# Patient Record
Sex: Female | Born: 1935 | Race: White | Hispanic: No | Smoking: Never smoker
Health system: Southern US, Community
[De-identification: ages and names within clinical notes are randomized; demographics above are authoritative.]

## PROBLEM LIST (undated history)

## (undated) DIAGNOSIS — N186 End stage renal disease: Secondary | ICD-10-CM

## (undated) DIAGNOSIS — T4145XA Adverse effect of unspecified anesthetic, initial encounter: Secondary | ICD-10-CM

## (undated) DIAGNOSIS — J449 Chronic obstructive pulmonary disease, unspecified: Secondary | ICD-10-CM

## (undated) DIAGNOSIS — T8859XA Other complications of anesthesia, initial encounter: Secondary | ICD-10-CM

## (undated) DIAGNOSIS — Z9289 Personal history of other medical treatment: Secondary | ICD-10-CM

## (undated) DIAGNOSIS — R112 Nausea with vomiting, unspecified: Secondary | ICD-10-CM

## (undated) DIAGNOSIS — I5022 Chronic systolic (congestive) heart failure: Secondary | ICD-10-CM

## (undated) DIAGNOSIS — Z8701 Personal history of pneumonia (recurrent): Secondary | ICD-10-CM

## (undated) DIAGNOSIS — D649 Anemia, unspecified: Secondary | ICD-10-CM

## (undated) DIAGNOSIS — Z9889 Other specified postprocedural states: Secondary | ICD-10-CM

## (undated) DIAGNOSIS — I2510 Atherosclerotic heart disease of native coronary artery without angina pectoris: Secondary | ICD-10-CM

## (undated) DIAGNOSIS — N2581 Secondary hyperparathyroidism of renal origin: Secondary | ICD-10-CM

## (undated) DIAGNOSIS — F0390 Unspecified dementia without behavioral disturbance: Secondary | ICD-10-CM

## (undated) DIAGNOSIS — M1990 Unspecified osteoarthritis, unspecified site: Secondary | ICD-10-CM

## (undated) DIAGNOSIS — IMO0001 Reserved for inherently not codable concepts without codable children: Secondary | ICD-10-CM

## (undated) HISTORY — DX: Secondary hyperparathyroidism of renal origin: N25.81

## (undated) HISTORY — PX: DIALYSIS FISTULA CREATION: SHX611

## (undated) HISTORY — DX: Chronic obstructive pulmonary disease, unspecified: J44.9

## (undated) HISTORY — DX: Atherosclerotic heart disease of native coronary artery without angina pectoris: I25.10

## (undated) HISTORY — DX: Chronic systolic (congestive) heart failure: I50.22

## (undated) HISTORY — DX: Personal history of pneumonia (recurrent): Z87.01

## (undated) HISTORY — DX: End stage renal disease: N18.6

## (undated) SURGERY — Surgical Case
Anesthesia: *Unknown

---

## 1990-11-19 HISTORY — PX: CHOLECYSTECTOMY: SHX55

## 2009-03-30 ENCOUNTER — Ambulatory Visit (HOSPITAL_COMMUNITY): Admission: RE | Admit: 2009-03-30 | Discharge: 2009-03-30 | Payer: Self-pay | Admitting: Family Medicine

## 2009-03-31 ENCOUNTER — Inpatient Hospital Stay (HOSPITAL_COMMUNITY): Admission: EM | Admit: 2009-03-31 | Discharge: 2009-04-12 | Payer: Self-pay | Admitting: Emergency Medicine

## 2009-04-05 ENCOUNTER — Ambulatory Visit: Payer: Self-pay | Admitting: Oncology

## 2009-04-05 ENCOUNTER — Encounter (INDEPENDENT_AMBULATORY_CARE_PROVIDER_SITE_OTHER): Payer: Self-pay | Admitting: Family Medicine

## 2009-04-12 ENCOUNTER — Inpatient Hospital Stay: Admission: RE | Admit: 2009-04-12 | Discharge: 2009-05-14 | Payer: Self-pay | Admitting: Family Medicine

## 2009-04-14 ENCOUNTER — Ambulatory Visit: Payer: Self-pay | Admitting: Vascular Surgery

## 2009-04-14 ENCOUNTER — Ambulatory Visit (HOSPITAL_COMMUNITY): Admission: RE | Admit: 2009-04-14 | Discharge: 2009-04-14 | Payer: Self-pay | Admitting: Vascular Surgery

## 2011-02-27 LAB — CBC
HCT: 23.4 % — ABNORMAL LOW (ref 36.0–46.0)
HCT: 26 % — ABNORMAL LOW (ref 36.0–46.0)
HCT: 26.5 % — ABNORMAL LOW (ref 36.0–46.0)
HCT: 26.5 % — ABNORMAL LOW (ref 36.0–46.0)
HCT: 27.8 % — ABNORMAL LOW (ref 36.0–46.0)
Hemoglobin: 10 g/dL — ABNORMAL LOW (ref 12.0–15.0)
Hemoglobin: 10.2 g/dL — ABNORMAL LOW (ref 12.0–15.0)
Hemoglobin: 8.5 g/dL — ABNORMAL LOW (ref 12.0–15.0)
Hemoglobin: 9.4 g/dL — ABNORMAL LOW (ref 12.0–15.0)
Hemoglobin: 9.8 g/dL — ABNORMAL LOW (ref 12.0–15.0)
Hemoglobin: 9.9 g/dL — ABNORMAL LOW (ref 12.0–15.0)
MCHC: 34.7 g/dL (ref 30.0–36.0)
MCHC: 34.7 g/dL (ref 30.0–36.0)
MCHC: 34.8 g/dL (ref 30.0–36.0)
MCHC: 35.6 g/dL (ref 30.0–36.0)
MCHC: 35.9 g/dL (ref 30.0–36.0)
MCHC: 34.8 g/dL (ref 30.0–36.0)
MCHC: 35 g/dL (ref 30.0–36.0)
MCV: 81.7 fL (ref 78.0–100.0)
MCV: 82.2 fL (ref 78.0–100.0)
MCV: 82.2 fL (ref 78.0–100.0)
MCV: 82.3 fL (ref 78.0–100.0)
MCV: 82.8 fL (ref 78.0–100.0)
MCV: 82.4 fL (ref 78.0–100.0)
Platelets: 256 10*3/uL (ref 150–400)
Platelets: 288 10*3/uL (ref 150–400)
Platelets: 317 10*3/uL (ref 150–400)
Platelets: 318 10*3/uL (ref 150–400)
Platelets: 324 10*3/uL (ref 150–400)
Platelets: 333 10*3/uL (ref 150–400)
Platelets: 308 10*3/uL (ref 150–400)
RBC: 2.85 MIL/uL — ABNORMAL LOW (ref 3.87–5.11)
RBC: 2.95 MIL/uL — ABNORMAL LOW (ref 3.87–5.11)
RBC: 3.18 MIL/uL — ABNORMAL LOW (ref 3.87–5.11)
RBC: 3.2 MIL/uL — ABNORMAL LOW (ref 3.87–5.11)
RBC: 3.42 MIL/uL — ABNORMAL LOW (ref 3.87–5.11)
RBC: 3.55 MIL/uL — ABNORMAL LOW (ref 3.87–5.11)
RBC: 3.24 MIL/uL — ABNORMAL LOW (ref 3.87–5.11)
RDW: 13.8 % (ref 11.5–15.5)
RDW: 13.9 % (ref 11.5–15.5)
RDW: 14 % (ref 11.5–15.5)
RDW: 13.9 % (ref 11.5–15.5)
RDW: 14 % (ref 11.5–15.5)
RDW: 14.1 % (ref 11.5–15.5)
WBC: 10.4 10*3/uL (ref 4.0–10.5)
WBC: 11.1 10*3/uL — ABNORMAL HIGH (ref 4.0–10.5)
WBC: 11.4 10*3/uL — ABNORMAL HIGH (ref 4.0–10.5)
WBC: 11.6 10*3/uL — ABNORMAL HIGH (ref 4.0–10.5)
WBC: 9.2 10*3/uL (ref 4.0–10.5)
WBC: 9.5 10*3/uL (ref 4.0–10.5)
WBC: 9.6 10*3/uL (ref 4.0–10.5)
WBC: 10.8 10*3/uL — ABNORMAL HIGH (ref 4.0–10.5)

## 2011-02-27 LAB — BASIC METABOLIC PANEL
BUN: 66 mg/dL — ABNORMAL HIGH (ref 6–23)
BUN: 68 mg/dL — ABNORMAL HIGH (ref 6–23)
BUN: 70 mg/dL — ABNORMAL HIGH (ref 6–23)
BUN: 74 mg/dL — ABNORMAL HIGH (ref 6–23)
BUN: 78 mg/dL — ABNORMAL HIGH (ref 6–23)
BUN: 81 mg/dL — ABNORMAL HIGH (ref 6–23)
BUN: 87 mg/dL — ABNORMAL HIGH (ref 6–23)
CO2: 21 mEq/L (ref 19–32)
CO2: 21 mEq/L (ref 19–32)
CO2: 21 mEq/L (ref 19–32)
CO2: 21 mEq/L (ref 19–32)
CO2: 17 mEq/L — ABNORMAL LOW (ref 19–32)
CO2: 18 mEq/L — ABNORMAL LOW (ref 19–32)
CO2: 19 mEq/L (ref 19–32)
Calcium: 8.9 mg/dL (ref 8.4–10.5)
Calcium: 9 mg/dL (ref 8.4–10.5)
Calcium: 9.1 mg/dL (ref 8.4–10.5)
Calcium: 9.1 mg/dL (ref 8.4–10.5)
Calcium: 8.8 mg/dL (ref 8.4–10.5)
Chloride: 105 mEq/L (ref 96–112)
Chloride: 108 mEq/L (ref 96–112)
Chloride: 93 mEq/L — ABNORMAL LOW (ref 96–112)
Chloride: 99 mEq/L (ref 96–112)
Chloride: 99 mEq/L (ref 96–112)
Chloride: 101 mEq/L (ref 96–112)
Chloride: 102 mEq/L (ref 96–112)
Chloride: 104 mEq/L (ref 96–112)
Creatinine, Ser: 5.35 mg/dL — ABNORMAL HIGH (ref 0.4–1.2)
Creatinine, Ser: 5.39 mg/dL — ABNORMAL HIGH (ref 0.4–1.2)
Creatinine, Ser: 5.83 mg/dL — ABNORMAL HIGH (ref 0.4–1.2)
Creatinine, Ser: 6.41 mg/dL — ABNORMAL HIGH (ref 0.4–1.2)
Creatinine, Ser: 6.63 mg/dL — ABNORMAL HIGH (ref 0.4–1.2)
Creatinine, Ser: 8.23 mg/dL — ABNORMAL HIGH (ref 0.4–1.2)
GFR calc Af Amer: 10 mL/min — ABNORMAL LOW (ref >60)
GFR calc Af Amer: 10 mL/min — ABNORMAL LOW (ref >60)
GFR calc Af Amer: 7 mL/min — ABNORMAL LOW (ref >60)
GFR calc Af Amer: 7 mL/min — ABNORMAL LOW (ref >60)
GFR calc Af Amer: 8 mL/min — ABNORMAL LOW (ref >60)
GFR calc Af Amer: 9 mL/min — ABNORMAL LOW (ref >60)
GFR calc non Af Amer: 6 mL/min — ABNORMAL LOW (ref >60)
GFR calc non Af Amer: 6 mL/min — ABNORMAL LOW (ref >60)
GFR calc non Af Amer: 8 mL/min — ABNORMAL LOW (ref >60)
GFR calc non Af Amer: 8 mL/min — ABNORMAL LOW (ref >60)
GFR calc non Af Amer: 5 mL/min — ABNORMAL LOW (ref >60)
Glucose, Bld: 102 mg/dL — ABNORMAL HIGH (ref 70–99)
Glucose, Bld: 105 mg/dL — ABNORMAL HIGH (ref 70–99)
Glucose, Bld: 90 mg/dL (ref 70–99)
Glucose, Bld: 95 mg/dL (ref 70–99)
Glucose, Bld: 96 mg/dL (ref 70–99)
Glucose, Bld: 101 mg/dL — ABNORMAL HIGH (ref 70–99)
Glucose, Bld: 99 mg/dL (ref 70–99)
Potassium: 3.7 mEq/L (ref 3.5–5.1)
Potassium: 3.7 mEq/L (ref 3.5–5.1)
Potassium: 3.8 mEq/L (ref 3.5–5.1)
Potassium: 4 mEq/L (ref 3.5–5.1)
Potassium: 4.1 mEq/L (ref 3.5–5.1)
Potassium: 4.4 mEq/L (ref 3.5–5.1)
Potassium: 4.6 mEq/L (ref 3.5–5.1)
Potassium: 5.1 mEq/L (ref 3.5–5.1)
Sodium: 125 mEq/L — ABNORMAL LOW (ref 135–145)
Sodium: 126 mEq/L — ABNORMAL LOW (ref 135–145)
Sodium: 129 mEq/L — ABNORMAL LOW (ref 135–145)
Sodium: 134 mEq/L — ABNORMAL LOW (ref 135–145)
Sodium: 132 mEq/L — ABNORMAL LOW (ref 135–145)

## 2011-02-27 LAB — CROSSMATCH: ABO/RH(D): A POS

## 2011-02-27 LAB — DIFFERENTIAL
Basophils Absolute: 0 10*3/uL (ref 0.0–0.1)
Basophils Absolute: 0 10*3/uL (ref 0.0–0.1)
Basophils Absolute: 0 10*3/uL (ref 0.0–0.1)
Basophils Absolute: 0 10*3/uL (ref 0.0–0.1)
Basophils Absolute: 0 10*3/uL (ref 0.0–0.1)
Basophils Relative: 0 % (ref 0–1)
Basophils Relative: 0 % (ref 0–1)
Basophils Relative: 0 % (ref 0–1)
Basophils Relative: 1 % (ref 0–1)
Basophils Relative: 0 % (ref 0–1)
Eosinophils Absolute: 0.4 10*3/uL (ref 0.0–0.7)
Eosinophils Absolute: 0.5 10*3/uL (ref 0.0–0.7)
Eosinophils Absolute: 0.5 10*3/uL (ref 0.0–0.7)
Eosinophils Absolute: 0.6 10*3/uL (ref 0.0–0.7)
Eosinophils Absolute: 0.7 10*3/uL (ref 0.0–0.7)
Eosinophils Absolute: 0.5 10*3/uL (ref 0.0–0.7)
Eosinophils Relative: 4 % (ref 0–5)
Eosinophils Relative: 4 % (ref 0–5)
Eosinophils Relative: 4 % (ref 0–5)
Eosinophils Relative: 5 % (ref 0–5)
Eosinophils Relative: 5 % (ref 0–5)
Eosinophils Relative: 7 % — ABNORMAL HIGH (ref 0–5)
Lymphocytes Relative: 11 % — ABNORMAL LOW (ref 12–46)
Lymphocytes Relative: 15 % (ref 12–46)
Lymphocytes Relative: 15 % (ref 12–46)
Lymphocytes Relative: 15 % (ref 12–46)
Lymphocytes Relative: 16 % (ref 12–46)
Lymphocytes Relative: 13 % (ref 12–46)
Lymphocytes Relative: 15 % (ref 12–46)
Lymphs Abs: 1.3 10*3/uL (ref 0.7–4.0)
Lymphs Abs: 1.4 10*3/uL (ref 0.7–4.0)
Lymphs Abs: 1.4 10*3/uL (ref 0.7–4.0)
Lymphs Abs: 1.5 10*3/uL (ref 0.7–4.0)
Lymphs Abs: 1.6 10*3/uL (ref 0.7–4.0)
Lymphs Abs: 1.8 10*3/uL (ref 0.7–4.0)
Lymphs Abs: 1.9 10*3/uL (ref 0.7–4.0)
Lymphs Abs: 1.5 10*3/uL (ref 0.7–4.0)
Lymphs Abs: 1.6 10*3/uL (ref 0.7–4.0)
Monocytes Absolute: 0.5 10*3/uL (ref 0.1–1.0)
Monocytes Absolute: 0.6 10*3/uL (ref 0.1–1.0)
Monocytes Absolute: 0.6 10*3/uL (ref 0.1–1.0)
Monocytes Absolute: 0.6 10*3/uL (ref 0.1–1.0)
Monocytes Absolute: 0.7 10*3/uL (ref 0.1–1.0)
Monocytes Relative: 4 % (ref 3–12)
Monocytes Relative: 5 % (ref 3–12)
Monocytes Relative: 5 % (ref 3–12)
Monocytes Relative: 5 % (ref 3–12)
Monocytes Relative: 5 % (ref 3–12)
Monocytes Relative: 7 % (ref 3–12)
Monocytes Relative: 5 % (ref 3–12)
Monocytes Relative: 6 % (ref 3–12)
Neutro Abs: 6.8 10*3/uL (ref 1.7–7.7)
Neutro Abs: 6.9 10*3/uL (ref 1.7–7.7)
Neutro Abs: 8 10*3/uL — ABNORMAL HIGH (ref 1.7–7.7)
Neutro Abs: 9.8 10*3/uL — ABNORMAL HIGH (ref 1.7–7.7)
Neutro Abs: 8.2 10*3/uL — ABNORMAL HIGH (ref 1.7–7.7)
Neutro Abs: 8.6 10*3/uL — ABNORMAL HIGH (ref 1.7–7.7)
Neutro Abs: 9.2 10*3/uL — ABNORMAL HIGH (ref 1.7–7.7)
Neutrophils Relative %: 72 % (ref 43–77)
Neutrophils Relative %: 74 % (ref 43–77)
Neutrophils Relative %: 75 % (ref 43–77)
Neutrophils Relative %: 78 % — ABNORMAL HIGH (ref 43–77)
Neutrophils Relative %: 85 % — ABNORMAL HIGH (ref 43–77)
Neutrophils Relative %: 76 % (ref 43–77)
Neutrophils Relative %: 76 % (ref 43–77)
Neutrophils Relative %: 76 % (ref 43–77)

## 2011-02-27 LAB — COMPREHENSIVE METABOLIC PANEL
ALT: 21 U/L (ref 0–35)
BUN: 96 mg/dL — ABNORMAL HIGH (ref 6–23)
Calcium: 8.8 mg/dL (ref 8.4–10.5)
Glucose, Bld: 125 mg/dL — ABNORMAL HIGH (ref 70–99)
Sodium: 132 mEq/L — ABNORMAL LOW (ref 135–145)
Total Protein: 6.9 g/dL (ref 6.0–8.3)

## 2011-02-27 LAB — URINALYSIS, ROUTINE W REFLEX MICROSCOPIC
Bilirubin Urine: NEGATIVE
Glucose, UA: NEGATIVE mg/dL
Ketones, ur: NEGATIVE mg/dL
Ketones, ur: NEGATIVE mg/dL
Protein, ur: >300 mg/dL — AB
Specific Gravity, Urine: 1.015 (ref 1.005–1.030)
Urobilinogen, UA: 0.2 mg/dL (ref 0.0–1.0)
pH: 6 (ref 5.0–8.0)

## 2011-02-27 LAB — PROTEIN ELECTROPH W RFLX QUANT IMMUNOGLOBULINS
Alpha-1-Globulin: 9.4 % — ABNORMAL HIGH (ref 2.9–4.9)
Alpha-2-Globulin: 16 % — ABNORMAL HIGH (ref 7.1–11.8)
Beta 2: 5.8 % (ref 3.2–6.5)
Gamma Globulin: 22.2 % — ABNORMAL HIGH (ref 11.1–18.8)
M-Spike, %: NOT DETECTED g/dL

## 2011-02-27 LAB — PHOSPHORUS
Phosphorus: 4.6 mg/dL (ref 2.3–4.6)
Phosphorus: 7.8 mg/dL — ABNORMAL HIGH (ref 2.3–4.6)

## 2011-02-27 LAB — IRON AND TIBC
Iron: 18 ug/dL — ABNORMAL LOW (ref 42–135)
Saturation Ratios: 11 % — ABNORMAL LOW (ref 20–55)
TIBC: 163 ug/dL — ABNORMAL LOW (ref 250–470)
UIBC: 145 ug/dL

## 2011-02-27 LAB — IGG, IGA, IGM
IgA: 218 mg/dL (ref 68–378)
IgG (Immunoglobin G), Serum: 1240 mg/dL (ref 694–1618)

## 2011-02-27 LAB — POCT I-STAT 4, (NA,K, GLUC, HGB,HCT)
HCT: 31 % — ABNORMAL LOW (ref 36.0–46.0)
Hemoglobin: 10.5 g/dL — ABNORMAL LOW (ref 12.0–15.0)
Sodium: 132 mEq/L — ABNORMAL LOW (ref 135–145)

## 2011-02-27 LAB — URINE MICROSCOPIC-ADD ON

## 2011-02-27 LAB — PROTIME-INR
INR: 1.1 (ref 0.00–1.49)
Prothrombin Time: 14.9 seconds (ref 11.6–15.2)

## 2011-02-27 LAB — URINE CULTURE
Colony Count: NO GROWTH
Special Requests: NEGATIVE

## 2011-02-27 LAB — MAGNESIUM: Magnesium: 2.3 mg/dL (ref 1.5–2.5)

## 2011-02-27 LAB — PROTEIN, URINE, 24 HOUR
Collection Interval-UPROT: 24 hours
Urine Total Volume-UPROT: 1750 mL

## 2011-02-27 LAB — HEMOGLOBIN A1C
Hgb A1c MFr Bld: 5.3 % (ref 4.6–6.1)
Mean Plasma Glucose: 105 mg/dL

## 2011-02-27 LAB — ANTI-NEUTROPHIL ANTIBODY

## 2011-02-27 LAB — C3 COMPLEMENT: C3 Complement: 140 mg/dL (ref 88–201)

## 2011-02-27 LAB — FERRITIN: Ferritin: 302 ng/mL — ABNORMAL HIGH (ref 10–291)

## 2011-02-27 LAB — UIFE/LIGHT CHAINS/TP QN, 24-HR UR
Albumin, U: DETECTED
Alpha 1, Urine: DETECTED — AB
Beta, Urine: DETECTED — AB
Gamma Globulin, Urine: DETECTED — AB
Total Protein, Urine-Ur/day: 1057 mg/d — ABNORMAL HIGH (ref 10–140)

## 2011-02-27 LAB — ANTISTREPTOLYSIN O TITER: ASO: 30 IU/mL (ref 0–116)

## 2011-02-27 LAB — C4 COMPLEMENT: Complement C4, Body Fluid: 22 mg/dL (ref 16–47)

## 2011-02-27 LAB — IMMUNOFIXATION ADD-ON

## 2011-02-27 LAB — PTH, INTACT AND CALCIUM: Calcium, Total (PTH): 7.5 mg/dL — ABNORMAL LOW (ref 8.4–10.5)

## 2011-04-03 NOTE — Op Note (Signed)
NAMEJOSCLYN, ROSALES                 ACCOUNT NO.:  [id number]   MEDICAL RECORD NO.:  [id number]          PATIENT TYPE:  AMB   LOCATION:  SDS                          FACILITY:  [facility]   PHYSICIAN:  Larina Earthly, M.D.    DATE OF BIRTH:  1931/07/14   DATE OF PROCEDURE:  04/14/2009  DATE OF DISCHARGE:  04/14/2009                               OPERATIVE REPORT   PREOPERATIVE DIAGNOSIS:  End-stage renal disease.   POSTOPERATIVE DIAGNOSIS:  End-stage renal disease.   PROCEDURE:  Right internal jugular Diatek catheter placement with  ultrasound visualization.   SURGEON:  Larina Earthly, MD   ASSISTANT:  Nurse.   ANESTHESIA:  MAC.   COMPLICATIONS:  None.   DISPOSITION:  To recovery room, stable.   PROCEDURE IN DETAIL:  The patient was taken to the operating room and  placed in the supine position.  The area of the right and left neck were  imaged with ultrasound revealing patent jugular veins bilaterally.  The  patient was placed in Trendelenburg position, and the right and left  neck and chest were prepped and draped in the usual sterile fashion.  Using local anesthesia and a finer needle, the right internal jugular  vein was accessed.  Next, using Seldinger technique, a guidewire was  passed down to the level of the right atrium.  This was confirmed with  fluoroscopy.  Dilator and peel-away sheath was passed over the guidewire  and the dilator and guidewire were removed.  A 28-cm Diatek catheter was  positioned to the level of the distal right atrium.  The catheter was  brought through subcutaneous tunnel through a separate stab incision.  The two lumen ports were attached and both lumens were flushed,  aspirated easily, and were locked with 1000 units/mL heparin.  The  catheter was secured to skin with 3-0 nylon stitch and the entry site  was closed with 4-0 subcuticular Vicryl stitch.  Sterile dressing was  applied and the patient was taken to the recovery room in stable  condition.      Larina Earthly, M.D.  Electronically Signed     TFE/MEDQ  D:  04/14/2009  T:  04/15/2009  Job:  [id number]

## 2011-04-03 NOTE — Group Therapy Note (Signed)
NAME:  [name], [name]                 ACCOUNT NO.:  [id number]   MEDICAL RECORD NO.:  [id number]          PATIENT TYPE:  INP   LOCATION:  A334                          FACILITY:  [facility]   PHYSICIAN:  Angus G. Renard Matter, MD   DATE OF BIRTH:  February 15, 1931   DATE OF PROCEDURE:  DATE OF DISCHARGE:                                 PROGRESS NOTE   The patient presented to the hospital with an abnormal BUN, creatinine,  and hematuria.  She remains on IV fluids.  Most recent labs show  creatinine of 7.11 with a GFR of 7.  The patient's hemoglobin is 8.1,  hematocrit 23.4, ferritin 302, parathyroid hormone 7.5, serum iron 18,  and total iron binding capacity of 163.   PHYSICAL EXAMINATION:  VITAL SIGNS:  BP 133/68, respiration 20, pulse  80, and temp 98.5.  LUNGS:  Clear to P and A.  HEART:  Regular rhythm.  ABDOMEN:  No palpable organs or masses.   ASSESSMENT:  The patient was admitted with renal insufficiency,  questionable acute versus chronic.  She had some cortical atrophy in the  right kidney, has anemia, and urinary tract infection.   PLAN:  To proceed with blood transfusion today.  Continue to monitor her  serum electrolytes.  Stool for occult blood.  We will add oral iron.      Angus G. Renard Matter, MD  Electronically Signed     AGM/MEDQ  D:  04/03/2009  T:  04/03/2009  Job:  [id number]

## 2011-04-03 NOTE — Group Therapy Note (Signed)
NAME:  [name], [name]                 ACCOUNT NO.:  [id number]   MEDICAL RECORD NO.:  [id number]          PATIENT TYPE:  INP   LOCATION:  A332                          FACILITY:  [facility]   PHYSICIAN:  Angus G. Renard Matter, MD   DATE OF BIRTH:  1931-08-23   DATE OF PROCEDURE:  DATE OF DISCHARGE:                                 PROGRESS NOTE   This patient entered the hospital with abnormal BUN, creatinine, and  hematuria.  She remains on IV fluids.  Her BUN and creatinine have  improved some.  CBC, BMET, and phosphorus have been done, but report at  present is not available.  The patient is much more comfortable, who is  being seen by Nephrology.   OBJECTIVE:  VITAL SIGNS:  BP 122/60, respiration 18, pulse 82, and  temperature 98.7.  LUNGS:  Clear to P&A.  HEART:  Regular rhythm.  ABDOMEN:  No palpable organs or masses.   LABORATORY DATA:  Hemoglobin 9.9, hematocrit 28.6.  Chemistries showed  sodium of 127, potassium 4.1, chloride 97, CO2 20, glucose 102, BUN 78,  creatinine 6.63, GFR 6.   ASSESSMENT:  The patient was admitted with renal insufficiency, acute  versus chronic.  She has some cortical atrophy in the right kidney, has  anemia, hematuria, urinary tract infection which is being treated.   PLAN:  To continue current regimen.  Continue to monitor hemoglobin,  hematocrit, electrolytes.  The patient will be seen by Nephrology as  well.  Ferrous sulfate was added because of iron deficiency.      Angus G. Renard Matter, MD  Electronically Signed     AGM/MEDQ  D:  04/05/2009  T:  04/05/2009  Job:  [id number]

## 2011-04-03 NOTE — Group Therapy Note (Signed)
NAME:  [name], [name]                 ACCOUNT NO.:  [id number]   MEDICAL RECORD NO.:  [id number]          PATIENT TYPE:  INP   LOCATION:  A334                          FACILITY:  [facility]   PHYSICIAN:  Angus G. Renard Matter, MD   DATE OF BIRTH:  01-15-1931   DATE OF PROCEDURE:  04/01/2009  DATE OF DISCHARGE:                                 PROGRESS NOTE   This patient presented to the ED with markedly abnormal BUN and  creatinine.  She had not had any kidney problems previously.  She was  noted to have on admission a creatinine of 8.23 with a BUN 95, GFR 5.  She did have some hematuria.   OBJECTIVE:  VITAL SIGNS:  Blood pressure 131/67, respiration 20, pulse  81, and temperature 98.8.  LUNGS:  Clear to P and A.  HEART:  Regular rhythm.  ABDOMEN:  No palpable organs or masses.   Hemoglobin now is 9.3 and hematocrit 26.7.  BUN 95 and creatinine 8.23.   ASSESSMENT:  The patient does appear to be in renal failure with  elevated BUN, creatinine, and hematuria.  The patient did have x-rays  done as an outpatient.  Renal ultrasound which shows age related renal  cortical thinning and atrophy.  No evidence of renal mass or  hydronephrosis.  Chest x-ray chronic-appearing lung changes, COPD.   PLAN:  To proceed with Nephrology consult.  Continue to monitor BUN and  creatinine.      Angus G. Renard Matter, MD  Electronically Signed     AGM/MEDQ  D:  04/01/2009  T:  04/01/2009  Job:  [id number]

## 2011-04-03 NOTE — H&P (Signed)
NAME:  [name], [name]                 ACCOUNT NO.:  [id number]   MEDICAL RECORD NO.:  [id number]          PATIENT TYPE:  INP   LOCATION:  A334                          FACILITY:  [facility]   PHYSICIAN:  Angus G. Renard Matter, MD   DATE OF BIRTH:  02/15/1931   DATE OF ADMISSION:  03/31/2009  DATE OF DISCHARGE:  LH                              HISTORY & PHYSICAL   A 77 year old white female who presented to the ED as requested by Dr.  Kristian Covey with markedly abnormal BUN and creatinine.  This patient had  come to me as an new patient on Mar 25, 2009, for complete physical  examination.  She has not had any prior hospitalizations or illnesses  with exception of remote cholecystectomy.  She had not had any kidney  problems.  She was examined in the office and found to have only  evidence of dermatitis on her low back and diminished hearing.  Initial  set of lab data showed hemoglobin 10.1, hematocrit 31.5, BUN 86,  creatinine 7.17 with sodium 136, potassium 5, chloride 99 and then today  this was repeated, potassium was 5.2, BUN 90, creatinine 7.54.  The  patient was sent over for ultrasound of kidneys which showed age-related  renal cortical thinning and atrophy, no evidence of renal mass or  hydronephrosis.  The patient in the ED had repeat chemistries which  showed a sodium of 132, potassium 5.1, chloride 101, CO2 20, glucose  125, BUN 96, creatinine 8.11 with GFR 6.  Urinalysis showed evidence of  blood with specific gravity of 1.015, pH of 6, microscopic showed 21-50  rbc's and many bacteria.  The patient was started on intravenous fluids  in the emergency department and subsequently was admitted.   SOCIAL HISTORY:  The patient does not smoke or drink alcohol.   PAST HISTORY:  Cholecystectomy.   ALLERGIES:  She has no known allergies.   MEDICATIONS:  She does not take medications.   REVIEW OF SYSTEMS:  HEENT:  Negative except for decreased hearing.  GI:  Occasional nausea and vomiting.  No  diarrhea or abdominal pain.  GU:  No  dysuria, no blood noted.  The patient has chronic urinary incontinence.   PHYSICAL EXAMINATION:  VITAL SIGNS:  Blood pressure 145/74, respirations  16, pulse 90, temperature 98.5.  HEENT:  Eyes, PERRLA.  TMs negative.  Oropharynx benign.  NECK:  Supple.  No JVD or thyroid abnormalities.  BREASTS:  No abnormal masses.  HEART:  Regular rhythm without murmurs.  No cardiomegaly.  LUNGS:  Clear to P&A.  ABDOMEN:  No palpable organs or masses.  No organomegaly.  GU:  External genitalia, normal.  Pelvic examination, uterus normal  size, shape.  No adnexal masses.  EXTREMITIES:  Free of edema.  SKIN:  The patient does have rash over the low back and lower trunk.  NEUROLOGIC:  No focal deficit.   ASSESSMENT:  The patient is admitted with markedly abnormal BUN,  creatinine and anemia, acute possible chronic renal failure, mild  dehydration.      Angus G. Renard Matter, MD  Electronically Signed  AGM/MEDQ  D:  03/31/2009  T:  04/01/2009  Job:  [id number]

## 2011-04-03 NOTE — Group Therapy Note (Signed)
NAME:  [name], [name]                 ACCOUNT NO.:  [id number]   MEDICAL RECORD NO.:  [id number]          PATIENT TYPE:  INP   LOCATION:  A332                          FACILITY:  [facility]   PHYSICIAN:  Catalina Pizza, M.D.        DATE OF BIRTH:  Oct 05, 1931   DATE OF PROCEDURE:  DATE OF DISCHARGE:                                 PROGRESS NOTE   SUBJECTIVE:  [name] is a 77 year old admitted to the hospital for  renal insufficiency, creatinine in the 8 range, elevated BUN.  She has  no complaints at this time.  States she is eating well.  Still though  with renal insufficiency.   PHYSICAL EXAMINATION:  VITAL SIGNS:  T-max 99.0, blood pressure 121/80,  pulse 90, respirations 20, satting 96% on room air.  GENERAL:  This is an obese white female lying in bed in no acute  distress.  HEENT:  Unremarkable.  LUNGS:  Clear to auscultation bilaterally.  HEART:  Regular rate and rhythm.  ABDOMEN:  Soft, protuberant, nontender, positive bowel sounds.  EXTREMITIES:  No lower extremity edema noted.  NEUROLOGIC:  The patient is alert and oriented, and answered questions  appropriately.   LABORATORY DATA:  CBC showed a white count of 10.1, hemoglobin 25.4,  platelet count 318.  BMET shows sodium 129, potassium 3.8, chloride 99,  CO2 21, glucose 95, BUN 74, creatinine 5.54, calcium 8.8.   IMPRESSION:  This is a 77 year old with renal insufficiency.   ASSESSMENT/PLAN:  Renal insufficiency with creatinine of 8 initially  trending down to 5.  She has been seen and evaluated by Dr. Kristian Covey and  he has been managing most of her electrolyte abnormalities along with  anemia which is most likely related to renal issues.  Questionable  whether the patient had some type of infection that caused some issue  and was started on ceftriaxone.  Unclear exactly what we are treating,  but she has been here for 7 days and has been receiving it since that  time, thus would be adequate treatment for guess/suspected.  Also  she  was started on it for urinary tract infection and should be adequately  treated at this time.   DISPOSITION:  The patient needs to get physical therapy to get her and  move.  Her son has concern about her not being able to get out of bed.  We will discontinue her Foley and see how she does voiding on her own.  Question whether we might need social work care management to look into  rehab facility if patient is still very weak from a medical standpoint.  She is okay to go out of the hospital and will need close follow up with  Dr. Kristian Covey.     Catalina Pizza, M.D.  Electronically Signed    ZH/MEDQ  D:  04/08/2009  T:  04/08/2009  Job:  [id number]

## 2011-04-03 NOTE — Group Therapy Note (Signed)
NAME:  [name], [name]                 ACCOUNT NO.:  [id number]   MEDICAL RECORD NO.:  [id number]          PATIENT TYPE:  INP   LOCATION:  A332                          FACILITY:  [facility]   PHYSICIAN:  Angus G. Renard Matter, MD   DATE OF BIRTH:  01-17-1931   DATE OF PROCEDURE:  DATE OF DISCHARGE:                                 PROGRESS NOTE   This patient presented to the hospital with abnormal BUN and creatinine  and hematuria.  She remains on IV fluids.  Her BUN and creatinine have  improved some.  She did receive a unit of blood yesterday.  CBC, BMET,  and phosphorus have been added to the lab today.  The patient continues  to feel fairly comfortable.   OBJECTIVE:  VITAL SIGNS:  Blood pressure 128/61, respiration 20, pulse  83, temp 98.9, and O2 sats are between 95-96%.   Yesterday, her creatinine was 7.11 with BUN of 81.  Urinary protein  1838.   ASSESSMENT:  The patient was admitted with renal insufficiency, acute  versus chronic.  She has had some cortical atrophy in the right kidney,  has anemia, hematuria, urinary tract infection, which is being treated.   PLAN:  To continue to monitor hemoglobin/hematocrit and phosphorus.  The  patient is being seen by Nephrology as well.      Angus G. Renard Matter, MD  Electronically Signed     AGM/MEDQ  D:  04/04/2009  T:  04/04/2009  Job:  [id number]

## 2011-04-03 NOTE — Group Therapy Note (Signed)
NAME:  [name], [name]                 ACCOUNT NO.:  [id number]   MEDICAL RECORD NO.:  [id number]          PATIENT TYPE:  INP   LOCATION:  A334                          FACILITY:  [facility]   PHYSICIAN:  Angus G. Renard Matter, MD   DATE OF BIRTH:  [birth year]   DATE OF PROCEDURE:  DATE OF DISCHARGE:                                 PROGRESS NOTE   SUBJECTIVE:  This patient presented to the hospital with abnormal BUN,  creatinine, and some hematuria.  She remains on intravenous fluids.   A repeat chemistry shows a sodium 132, potassium 5.1, chloride 102, CO2  18, glucose 101, BUN 87, and creatinine 7.53.  CBC showed a low  hemoglobin 8.3, hematocrit 23.9, phosphorus 7.2, and hemoglobin A1c 5.3.  Urinalysis on admission showed 21-50 rbc's.  Admitting bacteria, C4  complement 22.  The patient is nauseous this morning.   OBJECTIVE:  VITAL SIGNS:  Blood pressure 125/66, respirations 20, pulse  95, and temperature 99.5.   ASSESSMENT:  Renal insufficiency, questionable acute or chronic.  She  has some cortical atrophy in the right kidney.  She has anemia and  urinary tract infection.   PLAN:  Continue IV fluids.  Continue to monitor the patient.  The  patient might need dialysis if her renal function does not improve,  various tests are in progress.  ANA complement, hepatitis B surface  antigen, 24-hour urine for protein, immunophoresis, serum protein, and  phosphorus, PTH, and vitamin D level.      Angus G. Renard Matter, MD  Electronically Signed     AGM/MEDQ  D:  04/02/2009  T:  04/02/2009  Job:  [id number]

## 2011-04-03 NOTE — Group Therapy Note (Signed)
NAME:  [name], [name]                 ACCOUNT NO.:  [id number]   MEDICAL RECORD NO.:  [id number]          PATIENT TYPE:  INP   LOCATION:  A332                          FACILITY:  [facility]   PHYSICIAN:  Angus G. Renard Matter, MD   DATE OF BIRTH:  May 02, 1931   DATE OF PROCEDURE:  04/06/2009  DATE OF DISCHARGE:                                 PROGRESS NOTE   This patient was admitted to the hospital with abnormal BUN, creatinine,  and hematuria.  She remains on IV fluids.  Her BUN and creatinine have  improved some.  She is being seen by Nephrology.  Her creatinine today  is 5.9 with BUN 76.  CBC showed white count of 10,900 with hemoglobin  10.0, hematocrit 27.8.  Phosphorus was 7.6.   OBJECTIVE:  VITAL SIGNS:  Blood pressure 146/77, respiration 18, pulse  86, and temperature 98.2.  LUNGS:  Clear to P and A.  HEART:  Regular rhythm.  ABDOMEN:  No palpable organs or masses.   ASSESSMENT:  The patient was admitted to hospital with renal  insufficiency, acute versus chronic.  She has some cortical atrophy in  the right kidney.  Has had anemia, hematuria, urinary tract infection  which is being treated.   PLAN:  To continue to monitor her hemoglobin/hematocrit, phosphorus,  BUN, and creatinine.  She has been seen and followed by Nephrology.      Angus G. Renard Matter, MD  Electronically Signed     AGM/MEDQ  D:  04/06/2009  T:  04/06/2009  Job:  [id number]

## 2011-04-03 NOTE — Group Therapy Note (Signed)
NAMEBRYSSA, TONES                 ACCOUNT NO.:  [id number]   MEDICAL RECORD NO.:  [id number]          PATIENT TYPE:  INP   LOCATION:  A332                          FACILITY:  [facility]   PHYSICIAN:  Catalina Pizza, M.D.        DATE OF BIRTH:  [birth year]   DATE OF PROCEDURE:  04/09/2009  DATE OF DISCHARGE:                                 PROGRESS NOTE   SUBJECTIVE:  Ms. Pipkins is a 77 year old admitted to the hospital for  renal insufficiency.  She is having considerable weakness at this time  which was assessed by physical therapy yesterday.  From a medical  standpoint, she was improving related to her BUN and creatinine but  still not back to normal.  She is being followed by Dr. Kristian Covey.  She  denies any nausea or vomiting.  States that she has been eating a little  bit.   OBJECTIVE:  VITAL SIGNS:  Temperature 98.4, blood pressure 126/60, pulse  75, respirations 20, saturating 96% on room air.  GENERAL:  This is an obese, white female lying in bed in no acute  distress.  HEENT:  Unremarkable.  LUNGS:  Are clear to auscultation bilaterally.  HEART:  Regular rate and rhythm.  ABDOMEN:  Soft, protuberant, nontender.  Positive bowel sounds.  EXTREMITIES:  No lower extremity edema.  NEUROLOGICAL:  The patient is alert and oriented x3.  Answers questions  appropriately.   LABORATORY DATA:  CBC showed a white count of 9.5, hemoglobin 9.1,  platelet count 217.  BMET showed a sodium of 134, potassium 4.5,  chloride 106, CO2 20, glucose 103, creatinine 5.35.  Calcium 5.0.   IMPRESSION:  This is a 77 year old with renal failure, questionably  related to acute dehydration from nausea and vomiting.   ASSESSMENT AND PLAN:  1. Renal failure/renal insufficiency.  He is followed by Dr. Kristian Covey,      had thorough workup done, unknown exact cause of this renal      failure, but number is slowly trending down.  Did not see any need      for dialysis at this time.  2. Urinary tract infection.  Was  started on ceftriaxone and will      finish a 1-week course of it.  3. Generalized weakness.  The patient has been lying a bed for a week      and had concerns about her getting up and moving yesterday, and      physical therapy was consulted for the first time yesterday and was      very difficult to get her moving.  Will likely need considerable      rehabilitation if patient is amendable to this at a rehabilitation      facility locally.  From a clinical standpoint, the patient is      stable once a bed is available.     Catalina Pizza, M.D.  Electronically Signed    ZH/MEDQ  D:  04/09/2009  T:  04/09/2009  Job:  [id number]

## 2011-04-03 NOTE — Discharge Summary (Signed)
NAME:  [name], [name]                 ACCOUNT NO.:  [id number]   MEDICAL RECORD NO.:  [id number]          PATIENT TYPE:  INP   LOCATION:  A332                          FACILITY:  [facility]   PHYSICIAN:  Angus G. Renard Matter, MD   DATE OF BIRTH:  Feb 18, 1931   DATE OF ADMISSION:  DATE OF DISCHARGE:  05/25/2010LH                               DISCHARGE SUMMARY   The patient was discharged on the following medications:  1. Demadex 40 mg daily.  2. Nu-Iron 150 50 mg daily.  3. PhosLo 667 t.i.d.  4. Phenergan 25 mg q.4 h. p.r.n.      Angus G. Renard Matter, MD  Electronically Signed     AGM/MEDQ  D:  04/12/2009  T:  04/12/2009  Job:  [id number]

## 2011-04-03 NOTE — Group Therapy Note (Signed)
[name], [name]                 ACCOUNT NO.:  [id number]   MEDICAL RECORD NO.:  [id number]          PATIENT TYPE:  INP   LOCATION:  A332                          FACILITY:  [facility]   PHYSICIAN:  Mila Homer. Sudie Bailey, M.D.DATE OF BIRTH:  31-May-1931   DATE OF PROCEDURE:  04/07/2009  DATE OF DISCHARGE:                                 PROGRESS NOTE   SUBJECTIVE:  This is a 77 year old woman who was admitted to the  hospital with renal insufficiency.  Her prime reason visiting Dr.  Renard Matter for her first visit in this office earlier this month was an  itchy rash on the low back.  She now tells me that the itching is much  improved.  She has no other complaints.  Careful examination of back  showed no sign of rash.   OBJECTIVE:  VITAL SIGNS:  Temperature is 98.1, pulse 82, respiratory  rate 20, and blood pressure 139/62.  O2 sat was 96% on room air.  GENERAL:  She is alert and oriented, well-developed, and somewhat obese.  She is in no acute distress.  LUNGS:  Clear throughout.  HEART:  Regular rhythm without murmur at rate of 80.  ABDOMEN:  Soft without organomegaly, mass, or tenderness.  EXTREMITIES:  There is no edema of the ankles.   Today, her sodium was 128, potassium 3.3, BUN 78, creatinine 5.83, which  is down from BUN of 86 and creatinine 7.17, a week ago when she was  admitted.  Her hemoglobin was 9.8 down from 10 yesterday, 10.2 the day  before, and 10.1 on the day of admission.  White cell count is 9600,  notable for 8% eosinophils.   ASSESSMENT:  1. Renal insufficiency.  2. Pruritus secondary to renal insufficiency.  3. Nausea and occasional vomiting secondary to renal insufficiency.  4. Hypokalemia.  5. Anemia secondary to chronic renal failure.   PLAN:  Continue the ceftriaxone 1 g q.24 h. IV, enoxaparin 30 mg subcu  daily, furosemide 40 mg b.i.d., and ferrous sulfate 325 b.i.d.  Dr.  Kristian Covey, her nephrologist, will make adjustments in medication.      Mila Homer.  Sudie Bailey, M.D.  Electronically Signed     SDK/MEDQ  D:  04/07/2009  T:  04/07/2009  Job:  [id number]

## 2011-04-03 NOTE — Consult Note (Signed)
NAME:  [name], [name]                 ACCOUNT NO.:  [id number]   MEDICAL RECORD NO.:  [id number]          PATIENT TYPE:  INP   LOCATION:  A334                          FACILITY:  [facility]   PHYSICIAN:  Jorja Loa, M.D.DATE OF BIRTH:  1931/03/28   DATE OF CONSULTATION:  DATE OF DISCHARGE:                                 CONSULTATION   REASON FOR CONSULT:  Elevated BUN and creatinine.   HISTORY OF PRESENT ILLNESS:  [name] is a 77 year old female with no  significant past medical history and who has not seen a physician for  many years after she had a cholecystectomy in 1991.  Presently went to  Dr. Renard Matter with complaints of weakness and also some poor appetite with  some dermatitis.  When blood work was done, her creatinine was found to  be 7.17 and BUN of 86 and potassium of 5, because of that the patient  was referred to me.  However because of previous history of renal  failure at this moment, the patient is admitted to the hospital for  further workup and possible management.  [name] denies taking any  type of medication except Advil once in a while for her back ache.  She  denies any history of urinary tract infection.  No history of kidney  stone.  According to her son, however, the last couple of weeks, she  started complaining of some weakness, loss of appetite, and complaining  of some sort of viral infection, so that is the reason they took her to  Dr. Renard Matter.  Presently still she feels okay.  She does not have any  vomiting.  She denies any fever, chills, or sweating.   PAST MEDICAL HISTORY:  She has only history of cholecystitis at one  time.   SURGICAL HISTORY:  She has a history of cholecystectomy.   ALLERGY:  No known allergy.   MEDICATIONS:  She is not taking any medication as an outpatient as  stated above except Advil once in a while; however, presently she is on  Rocephin 1 g IV q.24 h., Lovenox 40 mg subcu.  She is getting normal  saline at 10  mL/h.   SOCIAL HISTORY:  No history of smoking.  No history alcohol abuse.  She  has 2 sons, the oldest one who has been with her is 75 years old.   REVIEW OF SYSTEMS:  Some nausea, poor appetite, however, there is no  history of weight loss.  She said that she had back pain about 2 weeks  ago.  Her only complaint seems to be feeling of weakness and feeling  tired.  She denies any shortness of breath, dizziness, or  lightheadedness.  She denies also swelling of her legs.   PHYSICAL EXAMINATION:  VITAL SIGNS:  Her temperature is 98.8, pulse of  81, respiratory rate is 20, blood pressure 131/67.  CHEST:  Clear to auscultation.  HEART:  Regular rate and rhythm.  No murmur.  No S3.  ABDOMEN:  Soft.  Positive bowel sounds.  Nontender.  No palpable  organomegaly.  No bruits.  EXTREMITIES:  No edema.   LABORATORY DATA:  Her white blood cell count is 12.1, hemoglobin 9.3,  hematocrit 26.7, and platelet of 303.  INR is 1.1, PT is 14.9.  Sodium  134; potassium 5.5; CO2 of 19; BUN is 95; creatinine is 8.23, yesterday  it was 8.1; and albumin is 2.5; calcium of 8.8 with magnesium of 2.3.  LFTs are normal.  Her UA showed hazy, specific gravity is 1.01, pH of 6,  she has a large blood, proteins 300 mg/dL, leukocytes small few, white  blood cells many, and red blood cells 21-50, and bacteria many.   ASSESSMENT:  1. Renal insufficiency.  At this moment, not sure whether this is      acute or chronic however, looking the ultrasound, which showed      right kidney to be 9.2 cm and left kidney 9.8 with some cortical      atrophy.  Probably, she might have chronic renal failure.  Since,      the patient seems to have significant proteinuria at this moment,      we needed to entertain possibilities of disease such as lupus since      she also has amenia with borderline calcium, multiple myeloma also      need to put into the differential diagnosis.  At this moment, other      etiologies also cannot be  ruled out, especially also diabetes      because when she came her blood sugar was 125, presently 99.  Still      other considerations also infections such as hepatitis B and      hepatitis C.  All need to be put into the differential diagnosis.  2. Hyperkalemia, probably related to renal failure.  3. Anemia.  H and H is low, iron-deficiency versus anemia of chronic      disease such as renal failure, multiple myeloma.  4. Urinary tract infection.  She is on antibiotics.  At this moment,      urine culture is pending, but there is no blood culture done.  5. History of back pain.   RECOMMENDATIONS:  We will stop hydrating her.  We will put a Foley  catheter.  We will check ANA complement, hepatitis B surface antigen.  We will send 24-hour urine for protein and immunoelectrophoresis.  Also,  we will do serum protein and immunoelectrophoresis.  We will check her  phosphorus, intact PTH, 25-vitamin D level.  If her renal function does  not improve, at this moment the patient may require dialysis, and I have  discussed this with her.  We will check also her hemoglobin A1c.      Jorja Loa, M.D.  Electronically Signed     BB/MEDQ  D:  04/01/2009  T:  04/02/2009  Job:  [id number]

## 2011-04-03 NOTE — Group Therapy Note (Signed)
NAMEEKTA, DANCER                 ACCOUNT NO.:  [id number]   MEDICAL RECORD NO.:  [id number]          PATIENT TYPE:  INP   LOCATION:  A332                          FACILITY:  [facility]   PHYSICIAN:  Catalina Pizza, M.D.        DATE OF BIRTH:  1931-10-26   DATE OF PROCEDURE:  04/10/2009  DATE OF DISCHARGE:                                 PROGRESS NOTE   SUBJECTIVE:  Ms. Robben is a 77 year old admitted to the hospital for  renal insufficiency and unknown exact cause of nausea and vomiting and  dehydration.  She had been followed by Dr. Kristian Covey.  She states that  overall she feels a little better, eating a little bit better, still  very weak but knows to call for assistance to get up out of bed.   OBJECTIVE:  VITAL SIGNS:  Temperature is 98.4, blood pressure 119/70,  pulse of 85, respirations 16 to 20, saturating 95% to 98% on room air.  GENERAL:  This is an obese white female lying in bed in no acute  distress eating lunch.  HEENT:  Unremarkable.  LUNGS:  Good air movement throughout, clear to auscultation bilaterally.  HEART:  Regular rate and rhythm.  ABDOMEN:  Soft, protuberant, nontender, positive bowel sounds.  EXTREMITIES:  No lower extremity edema.  NEUROLOGIC:  The patient is alert and oriented x3, answering questions  appropriately.   LABORATORY DATA:  Phosphorous level was 4.6.  BMET shows sodium 131,  potassium 4.1, chloride 104, CO2 20, glucose 105, BUN 68, creatinine of  5.35, calcium of 8.9.  CBC shows white count of 10.4, hemoglobin 9.1,  platelet count of 324,000.   IMPRESSION:  This is a 77 year old with renal failure, unknown cause  exactly.   ASSESSMENT/PLAN:  1. Renal failure/renal insufficiency.  Numbers are slowly trending      down but still BUN and creatinine are elevated.  Dr. Kristian Covey has      been following along closely and adjusting medications as well as      fluids and had full workup with no exact cause of this.  2. Urinary tract infection.  We will  start on ceftriaxone.  Finish a      full course with no further fever at this time.  3. Generalized weakness related to being in the hospital and      medications well as renal issues which elevated BUN could have been      causing the nausea and vomiting which is still resolved at this      time.  The patient is eating a little bit better, but physical      therapy working with the patient and will definitely rehab as      outpatient.      Social work care management work on that tomorrow and will likely      be able to be discharged from medical standpoint back to rehab      facility for further strengthening in attempts to get the patient      back home.  Dr. Renard Matter will follow up  the patient tomorrow.      Catalina Pizza, M.D.  Electronically Signed     ZH/MEDQ  D:  04/10/2009  T:  04/10/2009  Job:  [id number]

## 2011-04-03 NOTE — Discharge Summary (Signed)
NAME:  [name], [name]                 ACCOUNT NO.:  [id number]   MEDICAL RECORD NO.:  [id number]          PATIENT TYPE:  INP   LOCATION:  A332                          FACILITY:  [facility]   PHYSICIAN:  Angus G. Renard Matter, MD   DATE OF BIRTH:  10-19-1931   DATE OF ADMISSION:  03/31/2009  DATE OF DISCHARGE:  05/25/2010LH                               DISCHARGE SUMMARY   This patient was admitted for 12 days hospitalization.   DIAGNOSES:  Renal failure, urinary tract infection, dehydration,  hypokalemia, anemia secondary to chronic renal failure, renal ultrasound  showed age-related renal cortical thinning and atrophy, condition stable  and improved at the time of her discharge.   This 77 year old white female presented to ED as requested by Dr.  Kristian Covey with markedly abnormal BUN and creatinine.  The patient had  come to see me as a new patient on Mar 25, 2009, for complete physical  examination.  She had not had any prior hospitalizations or illnesses  with exception of remote cholecystectomy.  She not had any kidney  problems.  She is examined in the office and found to have only evidence  of dermatitis on her low back, diminished hearing.  Initial set of lab  data showed a creatinine of 7.17 with BUN 86, sodium 136, potassium 5,  chloride 99.  This was repeated and BUN was 90, creatinine 7.54.  The  patient was sent over for ultrasound of kidneys which showed age-related  renal cortical thinning and atrophy.  No evidence of renal mass or  hydronephrosis.  The patient was started on intravenous fluids on  admission to the hospital in the emergency department, subsequently  admitted to floor.   EXAMINATION:  GENERAL:  Alert white female.  VITAL SIGNS:  Blood pressure 145/74, respirations 16, pulse 90,  temperature 98.5.  HEENT: Eyes: PERRLA.  TMs negative.  Oropharynx benign.  NECK:  Supple.  No JVD or thyroid abnormalities.  BREASTS:  Normal.  No masses.  HEART: Regular rhythm without  murmurs.  No cardiomegaly.  LUNGS:  Clear to P&A.  ABDOMEN:  No palpable organs or masses.  No organomegaly.  NEUROLOGICAL:  No focal deficit.   LAB DATA:  Admission CBC, WBC 8800 with a hemoglobin 8.5, RBC 2.95, MCV  82.8, MCHC 34.7.  Urinalysis on admission showed a large amount of  blood, small normal leukocytes, microscopic revealed 21-50 rbc's,  numerous wbc's.  BMET on admission showed a sodium 132, potassium 5.1,  chloride 101, CO2 of 20, glucose 125, BUN 96, creatinine 8.11, GFR 5,  magnesium 2.3.  Prothrombin time INR 1.1.  Subsequent CBC following  transfusion hemoglobin 9.3, hematocrit 26.7.  Subsequent BMET, BUN 95,  creatinine 8.23, __________30.  C3 complement 140.  C4-4 complement 22.  Hepatitis B surface antigen negative.  Hemoglobin A1c 5.3, phosphorus  7.2.  Subsequent CBC, hemoglobin 8.3, hematocrit 23.3, serum iron 18,  total iron-binding capacity of 163, ferritin 302.  Subsequent CBC on  transfusion 10.2, hematocrit 29.2.  CH50 complement less than 60.  Vitamin D 30.  CBC on May 25, hemoglobin 8.5, hematocrit  24.4.  BMET,  creatinine 5.25, BUN 64. X-rays of both focal lytic lesions, acute  abdominal series, chronic-appearing lung changes, COPD, no definite  acute pulmonary findings.   HOSPITAL COURSE:  The patient on admission was started on renal diet.  Low protein, low potassium, 2-gram sodium.  Vital signs were monitored  q.i.d. The patient was started on intravenous fluids, normal saline,  subcu Lovenox was given for DVT prophylaxis.  The patient was started on  Rocephin 1 gram daily.  The patient was seen in consultation by Dr.  Kristian Covey.  The patient did have blood and cells in urine on admission  and was continued on antibiotic, culture showed no growth.  The patient  stated she was seen in consultation by nephrology service, and she was  seen by him throughout the entire hospitalization because of elevated  potassium.  The patient did receive Kayexalate.   She had to be typed and  crossmatched for blood, was given unit of blood.  The patient's CBC and  BMETs were monitored frequently throughout the entire hospitalization  and the BUN and creatinine gradually came down, and on May 25, the BUN  was 64, creatinine 5.25 still high but much lower than it was on  admission.  The patient throughout the entire hospital stay felt fairly  well with exception of weakness and episodes of nausea.  She did have a  Foley catheter.  Intake and output was monitored.  This was removed on  Apr 08, 2009.  Arrangements for her to be sent to the nursing facility  was accomplished.  The patient was treated throughout the entire  hospital period with medications, PhosLo 667 mg t.i.d., Nu-iron1 daily,  Demadex 20 mg daily.  She did have to have on occasions Zofran for  nausea.  Exact etiology of the renal failure was still unclear.  She did  have multiple tests runs in the hospital setting.  Magnesium level C4  complement, C3 complement, hemoglobin A1c, phosphorus, total iron,  ferritin level, parathyroid hormone, PTH, hepatitis C antibody, vitamin  D, CH50 complement, and IgE immunoglobulin.  The patient is to be  followed as an outpatient as well by Dr. Kristian Covey.      Angus G. Renard Matter, MD  Electronically Signed     AGM/MEDQ  D:  04/12/2009  T:  04/12/2009  Job:  [id number]

## 2011-04-03 NOTE — Group Therapy Note (Signed)
NAME:  [name], [name]                 ACCOUNT NO.:  [id number]   MEDICAL RECORD NO.:  [id number]          PATIENT TYPE:  INP   LOCATION:  A332                          FACILITY:  [facility]   PHYSICIAN:  Angus G. Renard Matter, MD   DATE OF BIRTH:  1931-07-10   DATE OF PROCEDURE:  DATE OF DISCHARGE:                                 PROGRESS NOTE   This patient was admitted to the hospital with renal insufficiency.  She  has been followed by Dr. Kristian Covey, is showing a gradual improvement.   CURRENT LABORATORY FINDINGS:  CBC; hemoglobin 9.2, hematocrit 26.5.  BMET shows a BUN of 68, creatinine 5.35.   PHYSICAL EXAMINATION:  LUNGS:  Clear to P&A.  HEART:  Regular rhythm.  ABDOMEN:  No palpable organs or masses.  No tenderness.  EXTREMITIES:  Free of edema.   ASSESSMENT:  The patient does have renal failure, has recent urinary  tract infection, generalized weakness.   PLAN:  To get her in a rehab facility when one is available.      Angus G. Renard Matter, MD  Electronically Signed     AGM/MEDQ  D:  04/11/2009  T:  04/11/2009  Job:  [id number]

## 2012-08-26 ENCOUNTER — Ambulatory Visit: Payer: Self-pay | Admitting: Vascular Surgery

## 2012-11-18 ENCOUNTER — Encounter (HOSPITAL_COMMUNITY): Payer: Self-pay | Admitting: *Deleted

## 2012-11-18 ENCOUNTER — Emergency Department (HOSPITAL_COMMUNITY): Payer: Medicare HMO

## 2012-11-18 ENCOUNTER — Emergency Department (HOSPITAL_COMMUNITY)
Admission: EM | Admit: 2012-11-18 | Discharge: 2012-11-18 | Disposition: A | Discharge status: Home / Self Care | Payer: Medicare HMO | Attending: Emergency Medicine | Admitting: Emergency Medicine

## 2012-11-18 DIAGNOSIS — R5381 Other malaise: Secondary | ICD-10-CM | POA: Insufficient documentation

## 2012-11-18 DIAGNOSIS — N259 Disorder resulting from impaired renal tubular function, unspecified: Secondary | ICD-10-CM | POA: Insufficient documentation

## 2012-11-18 DIAGNOSIS — Z992 Dependence on renal dialysis: Secondary | ICD-10-CM | POA: Insufficient documentation

## 2012-11-18 DIAGNOSIS — J40 Bronchitis, not specified as acute or chronic: Secondary | ICD-10-CM | POA: Insufficient documentation

## 2012-11-18 DIAGNOSIS — J3489 Other specified disorders of nose and nasal sinuses: Secondary | ICD-10-CM | POA: Insufficient documentation

## 2012-11-18 DIAGNOSIS — R1110 Vomiting, unspecified: Secondary | ICD-10-CM | POA: Insufficient documentation

## 2012-11-18 DIAGNOSIS — R6889 Other general symptoms and signs: Secondary | ICD-10-CM | POA: Insufficient documentation

## 2012-11-18 DIAGNOSIS — R509 Fever, unspecified: Secondary | ICD-10-CM | POA: Insufficient documentation

## 2012-11-18 DIAGNOSIS — R5383 Other fatigue: Secondary | ICD-10-CM | POA: Insufficient documentation

## 2012-11-18 LAB — CBC WITH DIFFERENTIAL/PLATELET
Eosinophils Relative: 1 % (ref 0–5)
HCT: 24.2 % — ABNORMAL LOW (ref 36.0–46.0)
Lymphocytes Relative: 10 % — ABNORMAL LOW (ref 12–46)
Lymphs Abs: 0.9 10*3/uL (ref 0.7–4.0)
MCV: 91.7 fL (ref 78.0–100.0)
Monocytes Absolute: 0.9 10*3/uL (ref 0.1–1.0)
RBC: 2.64 MIL/uL — ABNORMAL LOW (ref 3.87–5.11)
WBC: 9.1 10*3/uL (ref 4.0–10.5)

## 2012-11-18 LAB — COMPREHENSIVE METABOLIC PANEL
ALT: 8 U/L (ref 0–35)
CO2: 27 mEq/L (ref 19–32)
Calcium: 8.7 mg/dL (ref 8.4–10.5)
Creatinine, Ser: 3.62 mg/dL — ABNORMAL HIGH (ref 0.50–1.10)
GFR calc Af Amer: 13 mL/min — ABNORMAL LOW (ref >90)
GFR calc non Af Amer: 11 mL/min — ABNORMAL LOW (ref >90)
Glucose, Bld: 125 mg/dL — ABNORMAL HIGH (ref 70–99)
Sodium: 140 mEq/L (ref 135–145)

## 2012-11-18 LAB — INFLUENZA PANEL BY PCR (TYPE A & B)
Influenza A By PCR: POSITIVE — AB
Influenza B By PCR: NEGATIVE

## 2012-11-18 MED ORDER — ACETAMINOPHEN 650 MG RE SUPP
650.0000 mg | Freq: Once | RECTAL | Status: AC
  Administered 2012-11-18: 650 mg via RECTAL

## 2012-11-18 MED ORDER — MOXIFLOXACIN HCL 400 MG PO TABS: 400.0000 mg | ORAL_TABLET | Freq: Every day | ORAL | Status: DC

## 2012-11-18 MED ORDER — ONDANSETRON HCL 4 MG/2ML IJ SOLN
4.0000 mg | Freq: Once | INTRAMUSCULAR | Status: AC
  Administered 2012-11-18: 4 mg via INTRAVENOUS
  Filled 2012-11-18: qty 2

## 2012-11-18 MED ORDER — ACETAMINOPHEN 650 MG RE SUPP
RECTAL | Status: DC
Start: 2012-11-18 — End: 2012-11-18
  Filled 2012-11-18: qty 1

## 2012-11-18 MED ORDER — IBUPROFEN 800 MG PO TABS
800.0000 mg | ORAL_TABLET | Freq: Once | ORAL | Status: AC
  Administered 2012-11-18: 800 mg via ORAL
  Filled 2012-11-18: qty 1

## 2012-11-18 NOTE — ED Provider Notes (Signed)
History  This chart was scribed for Deanna Lennert, MD by [name], ED Scribe. The patient was seen in room APA10/APA10. Patient's care was started at 1444.   CSN: [id number]  Arrival date & time 11/18/12  1417   First MD Initiated Contact with Patient 11/18/12 1444      Chief Complaint  Patient presents with  . Cough     Patient is a 78 y.o. female presenting with cough. The history is provided by the patient. No language interpreter was used.  Cough This is a new problem. The current episode started 2 days ago. The problem occurs hourly. The problem has not changed since onset.The cough is non-productive. The maximum temperature recorded prior to her arrival was 102 to 102.9 F. Associated symptoms include rhinorrhea. Pertinent negatives include no chest pain and no headaches. She has tried nothing for the symptoms. She is not a smoker. Her past medical history does not include COPD or asthma.   HPI Comments: [name] is a 78 y.o. female with a history of renal disorder and dialysis who presents to the Emergency Department complaining of cough and fever onset 4-5 days ago. There is associated vomiting (x1), generalized weakness, rhinorrhea, sneezing and fatigue. Patient's temp at triage was 102.6. Patient was sent her from dialysis this afternoon. Patient has no other significant medical conditions. Son says that it is not known what precipitated renal disorder. He also states that patient has begun to show signs of dementia. She does not smoke. No history of asthma or COPD. Patient lives with one of her sons. Patient had dialysis on Sunday (2 days ago) and today and will have next treatment on Friday (3 days from now).   Past Medical History  Diagnosis Date  . Renal disorder     Past Surgical History  Procedure Date  . Dialysis fistula creation   . Cholecystectomy     No family history on file.  History  Substance Use Topics  . Smoking status: Never Smoker   .  Smokeless tobacco: Not on file  . Alcohol Use: No    OB History    Grav Para Term Preterm Abortions TAB SAB Ect Mult Living                  Review of Systems  Constitutional: Positive for fever. Negative for fatigue.  HENT: Positive for rhinorrhea and sneezing. Negative for congestion, sinus pressure and ear discharge.   Eyes: Negative for discharge.  Respiratory: Positive for cough.   Cardiovascular: Negative for chest pain.  Gastrointestinal: Positive for vomiting. Negative for abdominal pain and diarrhea.  Genitourinary: Negative for frequency and hematuria.  Musculoskeletal: Negative for back pain.  Skin: Negative for rash.  Neurological: Negative for seizures and headaches.  Hematological: Negative.   Psychiatric/Behavioral: Negative for hallucinations.    Allergies  Review of patient's allergies indicates no known allergies.  Home Medications  No current outpatient prescriptions on file.  Triage Vitals: BP 175/95  Pulse 124  Temp 102.6 F (39.2 C) (Oral)  Resp 23  Ht 5\' 4"  (1.626 m)  Wt 154 lb (69.854 kg)  BMI 26.43 kg/m2  SpO2 95%  Physical Exam  Constitutional: She is oriented to person, place, and time. She appears well-developed.  HENT:  Head: Normocephalic and atraumatic.  Eyes: Conjunctivae normal and EOM are normal. No scleral icterus.  Neck: Neck supple. No thyromegaly present.  Cardiovascular: Normal rate and regular rhythm.  Exam reveals no gallop and no friction  rub.   No murmur heard. Pulmonary/Chest: Effort normal. No stridor. She has no wheezes. She has no rales. She exhibits no tenderness.       Crackles to left side of lungs.  Abdominal: She exhibits no distension. There is no tenderness. There is no rebound.  Musculoskeletal: Normal range of motion. She exhibits no edema.  Lymphadenopathy:    She has no cervical adenopathy.  Neurological: She is oriented to person, place, and time. Coordination normal.  Skin: No rash noted. No erythema.    Psychiatric: She has a normal mood and affect. Her behavior is normal.    ED Course  Procedures (including critical care time) DIAGNOSTIC STUDIES: Oxygen Saturation is 95% on room air, adequate by my interpretation.    COORDINATION OF CARE: 2:50 PM- Patient informed of current plan for treatment and evaluation and agrees with plan at this time.   3:47 PM- X-ray findings are consistent with pneumonia. Got additional history from patient's relatives. Will speak with Dr. Juanetta [name] about patient's case and recommendation on whether to admit or discharge.   Labs Reviewed  CBC WITH DIFFERENTIAL - Abnormal; Notable for the following:    RBC 2.64 (*)     Hemoglobin 7.9 (*)     HCT 24.2 (*)     Neutrophils Relative 79 (*)     Lymphocytes Relative 10 (*)     All other components within normal limits  COMPREHENSIVE METABOLIC PANEL - Abnormal; Notable for the following:    Glucose, Bld 125 (*)     Creatinine, Ser 3.62 (*)     Albumin 3.2 (*)     GFR calc non Af Amer 11 (*)     GFR calc Af Amer 13 (*)     All other components within normal limits  LACTIC ACID, PLASMA  CULTURE, BLOOD (ROUTINE X 2)  CULTURE, BLOOD (ROUTINE X 2)    Dg Chest 2 View  11/18/2012  *RADIOLOGY REPORT*  Clinical Data: Cough and congestion.  Nausea and vomiting  CHEST - 2 VIEW  Comparison: chest radiograph 04/14/2009  Findings: Stable mild cardiomegaly.  Atherosclerotic calcification of the thoracic aortic arch.  Pulmonary vascularity appears upper normal.  There is diffuse interstitial prominence bilaterally, most prominent in the periphery and with a basilar predominance.  No focal airspace consolidation or definite pleural effusion. Negative for pneumothorax.  The patient is kyphotic, similar to prior exam.  The bones appear osteopenic.  No discrete compression deformity is visualized within the thoracic spine.  Remote healed right rib fractures noted.  IMPRESSION:  1.  Bilateral peripheral interstitial prominence.   This appears new or progressive compared to chest radiograph of 2010. Considerations include interstitial lung disease, and/or peribronchial thickening related to acute bronchitis. 2.  Mild cardiomegaly without failure.   Original Report Authenticated By: Britta Mccreedy, M.D.      No diagnosis found.  I spoke with dr. Juanetta [name] and he will follow up with pt thursday  MDM        The chart was scribed for me under my direct supervision.  I personally performed the history, physical, and medical decision making and all procedures in the evaluation of this patient.Deanna Lennert, MD 11/18/12 [phone]

## 2012-11-18 NOTE — ED Notes (Signed)
Lab called to notify that patient is positive for flu A and H1N1 is detected.  Dr. Estell Harpin notified.

## 2012-11-18 NOTE — ED Notes (Signed)
Per Dr. Estell Harpin- place pt back on RA and recheck sats on RA and 800mg  Ibuprofen.

## 2012-11-18 NOTE — ED Notes (Signed)
Pt placed on 2L [place]

## 2012-11-18 NOTE — ED Notes (Signed)
Congestion, cough and not feeling good x 2 days.  Came here from dialysis.

## 2012-11-23 LAB — CULTURE, BLOOD (ROUTINE X 2)

## 2013-04-25 ENCOUNTER — Encounter (HOSPITAL_COMMUNITY): Payer: Self-pay | Admitting: *Deleted

## 2013-04-25 ENCOUNTER — Inpatient Hospital Stay (HOSPITAL_COMMUNITY): Payer: Medicare HMO

## 2013-04-25 ENCOUNTER — Emergency Department (HOSPITAL_COMMUNITY): Payer: Medicare HMO

## 2013-04-25 ENCOUNTER — Inpatient Hospital Stay (HOSPITAL_COMMUNITY)
Admission: EM | Admit: 2013-04-25 | Discharge: 2013-04-30 | DRG: 177 | Disposition: A | Discharge status: Skilled Nursing Facility | Payer: Medicare HMO | Attending: Pulmonary Disease | Admitting: Pulmonary Disease

## 2013-04-25 DIAGNOSIS — N2581 Secondary hyperparathyroidism of renal origin: Secondary | ICD-10-CM | POA: Diagnosis present

## 2013-04-25 DIAGNOSIS — I120 Hypertensive chronic kidney disease with stage 5 chronic kidney disease or end stage renal disease: Secondary | ICD-10-CM | POA: Diagnosis present

## 2013-04-25 DIAGNOSIS — A0472 Enterocolitis due to Clostridium difficile, not specified as recurrent: Secondary | ICD-10-CM | POA: Diagnosis not present

## 2013-04-25 DIAGNOSIS — J9600 Acute respiratory failure, unspecified whether with hypoxia or hypercapnia: Secondary | ICD-10-CM | POA: Diagnosis present

## 2013-04-25 DIAGNOSIS — E8770 Fluid overload, unspecified: Secondary | ICD-10-CM | POA: Diagnosis present

## 2013-04-25 DIAGNOSIS — E876 Hypokalemia: Secondary | ICD-10-CM | POA: Diagnosis present

## 2013-04-25 DIAGNOSIS — J90 Pleural effusion, not elsewhere classified: Secondary | ICD-10-CM | POA: Diagnosis present

## 2013-04-25 DIAGNOSIS — Z992 Dependence on renal dialysis: Secondary | ICD-10-CM

## 2013-04-25 DIAGNOSIS — N186 End stage renal disease: Secondary | ICD-10-CM | POA: Diagnosis present

## 2013-04-25 DIAGNOSIS — J690 Pneumonitis due to inhalation of food and vomit: Principal | ICD-10-CM | POA: Diagnosis present

## 2013-04-25 DIAGNOSIS — F0390 Unspecified dementia without behavioral disturbance: Secondary | ICD-10-CM | POA: Diagnosis present

## 2013-04-25 DIAGNOSIS — J189 Pneumonia, unspecified organism: Secondary | ICD-10-CM

## 2013-04-25 LAB — BLOOD GAS, ARTERIAL
Acid-base deficit: 1.5 mmol/L (ref 0.0–2.0)
O2 Content: 3 L/min
O2 Saturation: 93.7 %
Patient temperature: 37

## 2013-04-25 LAB — CBC WITH DIFFERENTIAL/PLATELET
Basophils Absolute: 0.1 10*3/uL (ref 0.0–0.1)
Eosinophils Relative: 0 % (ref 0–5)
HCT: 34.7 % — ABNORMAL LOW (ref 36.0–46.0)
Lymphocytes Relative: 12 % (ref 12–46)
MCV: 93 fL (ref 78.0–100.0)
Monocytes Absolute: 0.5 10*3/uL (ref 0.1–1.0)
Monocytes Relative: 5 % (ref 3–12)
RDW: 17.8 % — ABNORMAL HIGH (ref 11.5–15.5)
WBC: 10.3 10*3/uL (ref 4.0–10.5)

## 2013-04-25 LAB — APTT: aPTT: 30 seconds (ref 24–37)

## 2013-04-25 LAB — COMPREHENSIVE METABOLIC PANEL
AST: 18 U/L (ref 0–37)
BUN: 36 mg/dL — ABNORMAL HIGH (ref 6–23)
CO2: 26 mEq/L (ref 19–32)
Calcium: 9.5 mg/dL (ref 8.4–10.5)
Creatinine, Ser: 5.16 mg/dL — ABNORMAL HIGH (ref 0.50–1.10)
GFR calc Af Amer: 8 mL/min — ABNORMAL LOW (ref >90)
GFR calc non Af Amer: 7 mL/min — ABNORMAL LOW (ref >90)
Glucose, Bld: 151 mg/dL — ABNORMAL HIGH (ref 70–99)

## 2013-04-25 LAB — PROTIME-INR
INR: 1.17 (ref 0.00–1.49)
Prothrombin Time: 14.7 seconds (ref 11.6–15.2)

## 2013-04-25 LAB — MRSA PCR SCREENING: MRSA by PCR: NEGATIVE

## 2013-04-25 MED ORDER — DEXTROSE 5 % IV SOLN
1.0000 g | INTRAVENOUS | Status: DC
  Administered 2013-04-25: 1 g via INTRAVENOUS
  Filled 2013-04-25 (×3): qty 10

## 2013-04-25 MED ORDER — DEXTROSE 5 % IV SOLN
INTRAVENOUS | Status: AC
  Filled 2013-04-25: qty 10

## 2013-04-25 MED ORDER — LEVALBUTEROL HCL 1.25 MG/0.5ML IN NEBU
INHALATION_SOLUTION | RESPIRATORY_TRACT | Status: AC
  Filled 2013-04-25: qty 0.5

## 2013-04-25 MED ORDER — IPRATROPIUM BROMIDE 0.02 % IN SOLN
0.5000 mg | Freq: Once | RESPIRATORY_TRACT | Status: AC
  Administered 2013-04-25: 0.5 mg via RESPIRATORY_TRACT
  Filled 2013-04-25: qty 2.5

## 2013-04-25 MED ORDER — DEXTROSE 5 % IV SOLN
INTRAVENOUS | Status: AC
  Filled 2013-04-25: qty 500

## 2013-04-25 MED ORDER — IPRATROPIUM BROMIDE 0.02 % IN SOLN
0.5000 mg | RESPIRATORY_TRACT | Status: DC | PRN
  Administered 2013-04-26: 0.5 mg via RESPIRATORY_TRACT
  Filled 2013-04-25: qty 2.5

## 2013-04-25 MED ORDER — ONDANSETRON HCL 4 MG/2ML IJ SOLN
4.0000 mg | Freq: Once | INTRAMUSCULAR | Status: AC
  Administered 2013-04-25: 4 mg via INTRAVENOUS
  Filled 2013-04-25: qty 2

## 2013-04-25 MED ORDER — SODIUM CHLORIDE 0.9 % IJ SOLN
3.0000 mL | Freq: Two times a day (BID) | INTRAMUSCULAR | Status: DC
  Administered 2013-04-25 – 2013-04-30 (×10): 3 mL via INTRAVENOUS

## 2013-04-25 MED ORDER — PIPERACILLIN-TAZOBACTAM IN DEX 2-0.25 GM/50ML IV SOLN
INTRAVENOUS | Status: AC
  Filled 2013-04-25: qty 100

## 2013-04-25 MED ORDER — SODIUM CHLORIDE 0.9 % IJ SOLN: 3.0000 mL | INTRAMUSCULAR | Status: DC | PRN

## 2013-04-25 MED ORDER — ONDANSETRON HCL 4 MG/2ML IJ SOLN: 4.0000 mg | Freq: Three times a day (TID) | INTRAMUSCULAR | Status: AC | PRN

## 2013-04-25 MED ORDER — LEVALBUTEROL HCL 1.25 MG/0.5ML IN NEBU
1.2500 mg | INHALATION_SOLUTION | RESPIRATORY_TRACT | Status: DC | PRN
  Administered 2013-04-26: 1.25 mg via RESPIRATORY_TRACT
  Filled 2013-04-25: qty 0.5

## 2013-04-25 MED ORDER — ALBUTEROL SULFATE (5 MG/ML) 0.5% IN NEBU: 5.0000 mg | INHALATION_SOLUTION | RESPIRATORY_TRACT | Status: AC | PRN

## 2013-04-25 MED ORDER — VANCOMYCIN HCL IN DEXTROSE 750-5 MG/150ML-% IV SOLN
INTRAVENOUS | Status: AC
  Filled 2013-04-25: qty 150

## 2013-04-25 MED ORDER — FUROSEMIDE 10 MG/ML IJ SOLN
60.0000 mg | Freq: Once | INTRAMUSCULAR | Status: AC
  Administered 2013-04-25: 60 mg via INTRAVENOUS
  Filled 2013-04-25: qty 6

## 2013-04-25 MED ORDER — LEVALBUTEROL HCL 1.25 MG/0.5ML IN NEBU
INHALATION_SOLUTION | RESPIRATORY_TRACT | Status: AC
  Administered 2013-04-25: 1.25 mg
  Filled 2013-04-25: qty 0.5

## 2013-04-25 MED ORDER — PIPERACILLIN-TAZOBACTAM IN DEX 2-0.25 GM/50ML IV SOLN
2.2500 g | Freq: Three times a day (TID) | INTRAVENOUS | Status: DC
  Administered 2013-04-26 – 2013-04-30 (×14): 2.25 g via INTRAVENOUS
  Filled 2013-04-25 (×17): qty 50

## 2013-04-25 MED ORDER — VANCOMYCIN HCL 10 G IV SOLR
1500.0000 mg | Freq: Once | INTRAVENOUS | Status: AC
  Administered 2013-04-25: 1500 mg via INTRAVENOUS
  Filled 2013-04-25: qty 1500

## 2013-04-25 MED ORDER — FUROSEMIDE 10 MG/ML IJ SOLN
40.0000 mg | Freq: Two times a day (BID) | INTRAMUSCULAR | Status: DC
  Administered 2013-04-26 – 2013-04-30 (×9): 40 mg via INTRAVENOUS
  Filled 2013-04-25 (×9): qty 4

## 2013-04-25 MED ORDER — DEXTROSE 5 % IV SOLN
500.0000 mg | Freq: Once | INTRAVENOUS | Status: AC
  Administered 2013-04-25: 500 mg via INTRAVENOUS
  Filled 2013-04-25: qty 500

## 2013-04-25 MED ORDER — ALBUTEROL SULFATE (5 MG/ML) 0.5% IN NEBU
5.0000 mg | INHALATION_SOLUTION | Freq: Once | RESPIRATORY_TRACT | Status: AC
  Administered 2013-04-25: 5 mg via RESPIRATORY_TRACT
  Filled 2013-04-25: qty 1

## 2013-04-25 MED ORDER — SODIUM CHLORIDE 0.9 % IV SOLN
INTRAVENOUS | Status: AC
  Administered 2013-04-25: 20:00:00 via INTRAVENOUS

## 2013-04-25 MED ORDER — SODIUM CHLORIDE 0.9 % IV SOLN: 250.0000 mL | INTRAVENOUS | Status: DC | PRN

## 2013-04-25 NOTE — H&P (Signed)
[facility] History and Physical  ALANTA SCOBEY ZOX:[id number] DOB: 1931/02/24 DOA: 04/25/2013  Referring physician:  Carleene Cooper, ER physician PCP: Fredirick Maudlin, MD  Specialists: Kristian Covey, Nephrology  Chief Complaint: Worsening shortness of breath  HPI: [name] is a 79 y.o. female  With minimal past medical history except for end-stage renal disease on dialysis and mild dementia who has been in her usual state of health. She went to [facility] 5 days ago for revascularization of her fistula. She gets dialysis on Monday/Wednesday/Friday and just had her dialysis done yesterday which reportedly went well with no complications. She has however noticed for the past 4-5 days she's had progressively worsening shortness of breath and cough. The cough is nonproductive. Her breathing became a bit more labored so she came into the emergency room today. Initially she was noted to be 91% on room air with a heart rate of 106 and a respiratory rate of 26. Chest x-ray noted a large (pneumonic effusion enzymes and developing infiltrate on the right side. Her spine was slightly twisted in the chest x-ray is difficult to obtain a really good view of this. Hospitals were called for admission community acquired pneumonia. However at some point, patient started to get nauseated and threw up. Since that time her breathing became more labored with wheezing. When I evaluated the patient emergency room I noticed that she did not have good tracing on her pulse ox and despite multiple fingers, poor tracing persisted. Patient is also not able to keep her nasal cannula in her nose. We will replace the pulse oximeter on her for head and she is noted to have an oxygen saturation of about 70%.  Review of Systems: Patient seen down emergency room. She complains of shortness of breath with wheezing and coughing-mild and nonproductive. She feels quite fatigued from this. She denies any headache, vision changes,  dysphasia, chest pain, palpitations, abdominal pain, hematuria, dysuria, constipation or diarrhea. She does states that she still able to make urine. Denies any focal extremity numbness or weakness or pain.  Past Medical History  Diagnosis Date  . Renal disorder    Past Surgical History  Procedure Laterality Date  . Dialysis fistula creation    . Cholecystectomy     Social History:  reports that she has never smoked. She does not have any smokeless tobacco history on file. She reports that she does not drink alcohol or use illicit drugs. Patient lives at home with her family nearby. Apparently she normally ambulates somewhat without use of a walker or cane. However family says she spends a lot of time sedentary  Allergies  Allergen Reactions  . Sulfur     Family history: Patient cannot think of any medical problems that run in the family.  Prior to Admission medications   Medication Sig Start Date End Date Taking? Authorizing Provider  donepezil (ARICEPT) 10 MG tablet Take 10 mg by mouth at bedtime.   Yes Historical Provider, MD  memantine (NAMENDA) 10 MG tablet Take 10 mg by mouth 2 (two) times daily. For memory   Yes Historical Provider, MD  multivitamin (RENA-VIT) TABS tablet Take 1 tablet by mouth daily.   Yes Historical Provider, MD  torsemide (DEMADEX) 20 MG tablet Take 20 mg by mouth daily.   Yes Historical Provider, MD  lidocaine-prilocaine (EMLA) cream Apply 1 application topically as needed. To be applied to port site, then wrap with saran wrap prior to accessing port.    Historical Provider, MD  Physical Exam: Filed Vitals:   04/25/13 1700 04/25/13 1800 04/25/13 1807 04/25/13 1837  BP: 172/81 160/63 160/63   Pulse: 112 109 109   Temp:      TempSrc:      Resp: 34 32 31   Height:      Weight:      SpO2: 98% 100% 100% 100%     General:  Alert and oriented x3, moderate distress secondary to labored breathing, looks about stated age, fatigued  Eyes: Sclera  nonicteric, extraocular movements are intact  ENT: Normocephalic, atraumatic, mucous membranes are dry  Neck: Supple, no carotid bruits appreciated  Cardiovascular: Regular rhythm, tachycardic  Respiratory: Decreased breath sounds by basilar more so on the left mid way down. Bilateral end expiratory wheezing, tachypneic  Abdomen: Soft, obese, nontender, hypoactive bowel sounds  Skin: No skin breaks, tears or lesions  Musculoskeletal: No clubbing or cyanosis, trace to 1+ pitting edema from the mid calf down bilaterally, fistula noted  Psychiatric: Patient appropriate, no evidence of psychoses  Neurologic: No overt deficits  Labs on Admission:  Basic Metabolic Panel:  Recent Labs Lab 04/25/13 1715  NA 134*  K 3.4*  CL 91*  CO2 26  GLUCOSE 151*  BUN 36*  CREATININE 5.16*  CALCIUM 9.5   Liver Function Tests:  Recent Labs Lab 04/25/13 1715  AST 18  ALT 5  ALKPHOS 74  BILITOT 0.5  PROT 8.2  ALBUMIN 3.5   CBC:  Recent Labs Lab 04/25/13 1715  WBC 10.3  NEUTROABS 8.6*  HGB 11.4*  HCT 34.7*  MCV 93.0  PLT 173    Radiological Exams on Admission: Dg Chest Port 1 View  04/25/2013     IMPRESSION: 1.  Opacity throughout the left mid and lower hemithorax concerning for left lower lobe airspace consolidation, likely from pneumonia, with moderate to large left parapneumonic pleural effusion. 2.  Diffuse interstitial prominence throughout the right lung may represent early endobronchial spread of infection. 3.  Probable cardiomegaly. 4.  Atherosclerosis.   Original Report Authenticated By: Trudie Reed, M.D.    EKG: Independently reviewed. Sinus tach with left axis deviation  Assessment/Plan Principal Problem:   Aspiration pneumonia: Have started vancomycin and Zosyn to cover for aspiration event. We'll go with Venturi facemask since she's not able tolerate nasal cannula. Place and stepped down unit. She does wish to be a full code which is also confirmed by her  family. While CPR and nebulizers and hopefully with diuresis, possible dialysis, antibiotics and breathing treatments oxygen support, she will respond well and quickly. Followup chest x-ray and ABG plus labs in the morning. Active Problems:   ESRD (end stage renal disease): Awaiting callback from nephrology. We'll speak to her nephrologist about trying to dialyze her tonight. I suspect some of her breathing and associated effusion may be from volume overload and this should help with that. The patient is able to make urine. I've also given her a dose of Lasix and started Lasix twice a day.    Senile dementia: Continue Aricept and Namenda    CAP (community acquired pneumonia): Supersede by aspiration pneumonia.    Code Status: Full code  Family Communication: Plan discussed with patient, her son and daughter-in-law at the bedside  Disposition Plan: Will likely be here for a few days  Time spent: 40 minutes  Hollice Espy [facility] Pager [phone]  If 7PM-7AM, please contact night-coverage [email or web address] Password [facility] 04/25/2013, 6:55 PM

## 2013-04-25 NOTE — Consult Note (Signed)
Reason for Consult: Difficulty in breathing and end-stage renal disease. Referring Physician: Dr. Mylo Red [name] is an 79 y.o. female.  HPI: She is a patient who has history of dementia, hypertension end-stage renal disease on maintenance hemodialysis presently was brought by her son because of difficulty in breathing. According to him yesterday after she came from dialysis she did very well and then she start having some cough and difficulty breathing. According to the patient she felt short-winded and unable to lie down. She produce some whitish stuff. She denies any chest pain. In she denies any nausea vomiting. Presently she denies any fever chills or sweating.  Past Medical History  Diagnosis Date  . Renal disorder     Past Surgical History  Procedure Laterality Date  . Dialysis fistula creation    . Cholecystectomy      No family history on file.  Social History:  reports that she has never smoked. She does not have any smokeless tobacco history on file. She reports that she does not drink alcohol or use illicit drugs.  Allergies:  Allergies  Allergen Reactions  . Sulfur     Medications: Deanna have reviewed the patient's current medications.  Results for orders placed during the hospital encounter of 04/25/13 (from the past 48 hour(s))  CULTURE, BLOOD (ROUTINE X 2)     Status: None   Collection Time    04/25/13  4:45 PM      Result Value Range   Specimen Description BLOOD RIGHT ARM     Special Requests       Value: BOTTLES DRAWN AEROBIC AND ANAEROBIC 6CC EACH BOTTLE   Culture PENDING     Report Status PENDING    LACTIC ACID, PLASMA     Status: Abnormal   Collection Time    04/25/13  5:10 PM      Result Value Range   Lactic Acid, Venous 3.7 (*) 0.5 - 2.2 mmol/L  CBC WITH DIFFERENTIAL     Status: Abnormal   Collection Time    04/25/13  5:15 PM      Result Value Range   WBC 10.3  4.0 - 10.5 K/uL   RBC 3.73 (*) 3.87 - 5.11 MIL/uL   Hemoglobin 11.4 (*) 12.0 -  15.0 g/dL   HCT 16.1 (*) 09.6 - 04.5 %   MCV 93.0  78.0 - 100.0 fL   MCH 30.6  26.0 - 34.0 pg   MCHC 32.9  30.0 - 36.0 g/dL   RDW 40.9 (*) 81.1 - 91.4 %   Platelets 173  150 - 400 K/uL   Neutrophils Relative % 83 (*) 43 - 77 %   Neutro Abs 8.6 (*) 1.7 - 7.7 K/uL   Lymphocytes Relative 12  12 - 46 %   Lymphs Abs 1.2  0.7 - 4.0 K/uL   Monocytes Relative 5  3 - 12 %   Monocytes Absolute 0.5  0.1 - 1.0 K/uL   Eosinophils Relative 0  0 - 5 %   Eosinophils Absolute 0.0  0.0 - 0.7 K/uL   Basophils Relative 1  0 - 1 %   Basophils Absolute 0.1  0.0 - 0.1 K/uL  COMPREHENSIVE METABOLIC PANEL     Status: Abnormal   Collection Time    04/25/13  5:15 PM      Result Value Range   Sodium 134 (*) 135 - 145 mEq/L   Potassium 3.4 (*) 3.5 - 5.1 mEq/L   Chloride 91 (*) 96 -  112 mEq/L   CO2 26  19 - 32 mEq/L   Glucose, Bld 151 (*) 70 - 99 mg/dL   BUN 36 (*) 6 - 23 mg/dL   Creatinine, Ser 1.61 (*) 0.50 - 1.10 mg/dL   Calcium 9.5  8.4 - 09.6 mg/dL   Total Protein 8.2  6.0 - 8.3 g/dL   Albumin 3.5  3.5 - 5.2 g/dL   AST 18  0 - 37 U/L   ALT 5  0 - 35 U/L   Alkaline Phosphatase 74  39 - 117 U/L   Total Bilirubin 0.5  0.3 - 1.2 mg/dL   GFR calc non Af Amer 7 (*) >90 mL/min   GFR calc Af Amer 8 (*) >90 mL/min   Comment:            The eGFR has been calculated     using the CKD EPI equation.     This calculation has not been     validated in all clinical     situations.     eGFR's persistently     <90 mL/min signify     possible Chronic Kidney Disease.  PROTIME-INR     Status: None   Collection Time    04/25/13  5:15 PM      Result Value Range   Prothrombin Time 14.7  11.6 - 15.2 seconds   INR 1.17  0.00 - 1.49  APTT     Status: None   Collection Time    04/25/13  5:15 PM      Result Value Range   aPTT 30  24 - 37 seconds  CULTURE, BLOOD (ROUTINE X 2)     Status: None   Collection Time    04/25/13  5:15 PM      Result Value Range   Specimen Description BLOOD RIGHT ARM     Special  Requests       Value: BOTTLES DRAWN AEROBIC AND ANAEROBIC 6CC EACH BOTTLE   Culture PENDING     Report Status PENDING    BLOOD GAS, ARTERIAL     Status: Abnormal   Collection Time    04/25/13  5:20 PM      Result Value Range   O2 Content 3.0     Delivery systems NASAL CANNULA     pH, Arterial 7.402  7.350 - 7.450   pCO2 arterial 37.0  35.0 - 45.0 mmHg   pO2, Arterial 73.8 (*) 80.0 - 100.0 mmHg   Bicarbonate 22.6  20.0 - 24.0 mEq/L   TCO2 20.7  0 - 100 mmol/L   Acid-base deficit 1.5  0.0 - 2.0 mmol/L   O2 Saturation 93.7     Patient temperature 37.0     Collection site RIGHT RADIAL     Drawn by COLLECTED BY RT     Sample type ARTERIAL     Allens test (pass/fail) PASS  PASS    Dg Chest Port 1 View  04/25/2013   *RADIOLOGY REPORT*  Clinical Data: 79 year old female with pneumonia and effusion. Cough.  PORTABLE CHEST - 1 VIEW  Comparison: 04/25/2013 and earlier.  Findings: Portable semi upright AP view at 1947 hours.  The patient is not slightly more rotated to the left.  Stable lung volumes. Stable cardiomegaly and mediastinal contours.  Left subclavian/axillary vein stent graft again noted.  No significant interval change in dense retrocardiac opacity felt to represent a combination of consolidation and effusion.  Patchy right mid and lower lung opacity also not  significantly changed.  IMPRESSION: No significant interval change.  Dense retrocardiac consolidation, left pleural effusion, and developing infection in the right lung.   Original Report Authenticated By: Erskine Speed, M.D.   Dg Chest Port 1 View  04/25/2013   *RADIOLOGY REPORT*  Clinical Data: Cough for the past 4 days.  Suspected pneumonia.  PORTABLE CHEST - 1 VIEW  Comparison: Chest x-ray 11/18/2012.  Findings: Dense opacity throughout the left mid and lower hemithorax concerning for extensive airspace consolidation in the left lower lobe, likely with a left parapneumonic pleural effusion. Diffuse interstitial prominence throughout  the right lung.  Heart size appears enlarged, but is largely obscured. The patient is rotated to the left on today's exam, resulting in distortion of the mediastinal contours and reduced diagnostic sensitivity and specificity for mediastinal pathology.  Atherosclerosis of the thoracic aorta. A vascular stent is noted in the left subclavian and axillary region.  IMPRESSION: 1.  Opacity throughout the left mid and lower hemithorax concerning for left lower lobe airspace consolidation, likely from pneumonia, with moderate to large left parapneumonic pleural effusion. 2.  Diffuse interstitial prominence throughout the right lung may represent early endobronchial spread of infection. 3.  Probable cardiomegaly. 4.  Atherosclerosis.   Original Report Authenticated By: Trudie Reed, M.D.    Review of Systems  Respiratory: Positive for cough, sputum production, shortness of breath and wheezing.   Cardiovascular: Positive for orthopnea. Negative for chest pain.  Gastrointestinal: Negative for heartburn and nausea.  Neurological: Positive for weakness.   Blood pressure 160/63, pulse 109, temperature 100.4 F (38 C), temperature source Axillary, resp. rate 31, height 5\' 3"  (1.6 m), weight 73.8 kg (162 lb 11.2 oz), SpO2 100.00%. Physical Exam  Eyes: No scleral icterus.  Neck: No JVD present.  Cardiovascular: Normal rate and regular rhythm.   No murmur heard. Respiratory: She is in respiratory distress. She has wheezes. She exhibits no tenderness.  GI: She exhibits no distension. There is no tenderness. There is no rebound.  Musculoskeletal: She exhibits no edema.    Assessment/Plan: Problem #1 difficulty breathing possibly combination of CHF and pneumonia. Presently patient on oxygen and seems to be feeling slightly better. Patient is status post hemodialysis yesterday and she left at her estimated dry weight. Problem #2 possible pneumonia patient with fever and cough. Chest x-ray also showed possible  left lower lobe airspace disease. At this moment patient also might have pleural effusion. Patient presently is started on antibiotics. Problem #3 hypertension Problem #4 anemia her hemoglobin and hematocrit is was in acceptable range. Problem #5 metabolic bone disease calcium is good and phosphorus is not available. Problem #6 history of dementia.  Plan: We'll make arrangements for patient to get dialysis today We'll try to see if we can get about 3 L with ultrafiltration. 4K 2.5 calcium bath.  agree with antibiotics We'll check her basic metabolic panel, phosphorus and CBC in the morning. [name] S 04/25/2013, 9:08 PM

## 2013-04-25 NOTE — ED Notes (Signed)
Increased sob, congestion x 4 days.

## 2013-04-25 NOTE — Progress Notes (Signed)
ANTIBIOTIC CONSULT NOTE  Pharmacy Consult for Vancomycin and Zosyn Indication: rule out pneumonia  Allergies  Allergen Reactions  . Sulfur     Patient Measurements: Height: 5\' 3"  (160 cm) Weight: 155 lb (70.308 kg) IBW/kg (Calculated) : 52.4   Vital Signs: Temp: 97.2 F (36.2 C) (06/07 1632) Temp src: Oral (06/07 1632) BP: 160/63 mmHg (06/07 1807) Pulse Rate: 109 (06/07 1807) Intake/Output from previous day:   Intake/Output from this shift:    Labs:  Recent Labs  04/25/13 1715  WBC 10.3  HGB 11.4*  PLT 173  CREATININE 5.16*   Estimated Creatinine Clearance: 8.2 ml/min (by C-G formula based on Cr of 5.16). No results found for this basename: VANCOTROUGH, VANCOPEAK, VANCORANDOM, GENTTROUGH, GENTPEAK, GENTRANDOM, TOBRATROUGH, TOBRAPEAK, TOBRARND, AMIKACINPEAK, AMIKACINTROU, AMIKACIN,  in the last 72 hours   Microbiology: Recent Results (from the past 720 hour(s))  CULTURE, BLOOD (ROUTINE X 2)     Status: None   Collection Time    04/25/13  4:45 PM      Result Value Range Status   Specimen Description BLOOD RIGHT ARM   Final   Special Requests     Final   Value: BOTTLES DRAWN AEROBIC AND ANAEROBIC 6CC EACH BOTTLE   Culture PENDING   Incomplete   Report Status PENDING   Incomplete  CULTURE, BLOOD (ROUTINE X 2)     Status: None   Collection Time    04/25/13  5:15 PM      Result Value Range Status   Specimen Description BLOOD RIGHT ARM   Final   Special Requests     Final   Value: BOTTLES DRAWN AEROBIC AND ANAEROBIC 6CC EACH BOTTLE   Culture PENDING   Incomplete   Report Status PENDING   Incomplete    Medical History: Past Medical History  Diagnosis Date  . Renal disorder     Medications:  Prescriptions prior to admission  Medication Sig Dispense Refill  . donepezil (ARICEPT) 10 MG tablet Take 10 mg by mouth at bedtime.      . memantine (NAMENDA) 10 MG tablet Take 10 mg by mouth 2 (two) times daily. For memory      . multivitamin (RENA-VIT) TABS  tablet Take 1 tablet by mouth daily.      [name] torsemide (DEMADEX) 20 MG tablet Take 20 mg by mouth daily.      [name] lidocaine-prilocaine (EMLA) cream Apply 1 application topically as needed. To be applied to port site, then wrap with saran wrap prior to accessing port.       Assessment: Okay for Protocol ESRD patient being treated for suspected PNA  Goal of Therapy:  Pre-Hemodialysis Vancomycin level goal range =15-25 mcg/ml  Plan:  Vancomycin 1500mg  IV x 1 Zosyn 2.25 gm IV every 8 hours Measure antibiotic drug levels at steady state Follow up culture results  Mady Gemma 04/25/2013,7:51 PM

## 2013-04-25 NOTE — ED Provider Notes (Addendum)
History     CSN: [id number]  Arrival date & time 04/25/13  1625   First MD Initiated Contact with Patient 04/25/13 1640      Chief Complaint  Patient presents with  . Shortness of Breath    (Consider location/radiation/quality/duration/timing/severity/associated sxs/prior treatment) Patient is a 79 y.o. female presenting with shortness of breath. The history is provided by the patient and medical records. No language interpreter was used.  Shortness of Breath Severity:  Severe (Pt is an 79 yo lady on hemodialysis who has developed a bad cough and shortness of breath over the past 4 days.) Onset quality:  Gradual Duration:  4 days Timing:  Constant Progression:  Worsening Chronicity:  New Context: not smoke exposure   Relieved by:  Nothing Worsened by:  Nothing tried Associated symptoms: cough   Associated symptoms: no chest pain, no hemoptysis, no sputum production, no vomiting and no wheezing  Fever: She thinks she has had a fever, but has not taken her temperature.   Cough:    Cough characteristics:  Non-productive, hacking and nocturnal   Severity:  Severe   Onset quality:  Gradual   Duration:  4 days   Timing:  Constant   Progression:  Worsening   Chronicity:  New Risk factors comment:  ESRD on hemodialysis   Past Medical History  Diagnosis Date  . Renal disorder     Past Surgical History  Procedure Laterality Date  . Dialysis fistula creation    . Cholecystectomy      No family history on file.  History  Substance Use Topics  . Smoking status: Never Smoker   . Smokeless tobacco: Not on file  . Alcohol Use: No    OB History   Grav Para Term Preterm Abortions TAB SAB Ect Mult Living                  Review of Systems  Constitutional: Positive for chills. Fever: She thinks she has had a fever, but has not taken her temperature.  HENT: Negative.   Eyes: Negative.   Respiratory: Positive for cough and shortness of breath. Negative for hemoptysis,  sputum production and wheezing.   Cardiovascular: Negative for chest pain.  Gastrointestinal: Negative.  Negative for vomiting.  Genitourinary: Negative.   Musculoskeletal: Negative.   Neurological: Negative.   Psychiatric/Behavioral: Negative.     Allergies  Sulfur  Home Medications   Current Outpatient Rx  Name  Route  Sig  Dispense  Refill  . donepezil (ARICEPT) 10 MG tablet   Oral   Take 10 mg by mouth at bedtime.         . lidocaine-prilocaine (EMLA) cream   Topical   Apply 1 application topically as needed. To be applied to port site, then wrap with saran wrap prior to accessing port.         . memantine (NAMENDA) 10 MG tablet   Oral   Take 10 mg by mouth 2 (two) times daily. For memory         . moxifloxacin (AVELOX) 400 MG tablet   Oral   Take 1 tablet (400 mg total) by mouth daily.   7 tablet   0   . multivitamin (RENA-VIT) TABS tablet   Oral   Take 1 tablet by mouth daily.         . sevelamer (RENVELA) 800 MG tablet   Oral   Take 1,600 mg by mouth 3 (three) times daily with meals. *Given two capsules with  meals and with snacks*         . torsemide (DEMADEX) 20 MG tablet   Oral   Take 20 mg by mouth daily.           BP 159/90  Pulse 106  Temp(Src) 97.2 F (36.2 C) (Oral)  Resp 26  Ht 5\' 3"  (1.6 m)  Wt 155 lb (70.308 kg)  BMI 27.46 kg/m2  SpO2 91%  Physical Exam  Nursing note and vitals reviewed. Constitutional: She is oriented to person, place, and time. She appears well-developed and well-nourished.  Mild distress with cough.  HENT:  Head: Normocephalic and atraumatic.  Right Ear: External ear normal.  Left Ear: External ear normal.  Mouth/Throat: Oropharynx is clear and moist.  Eyes: Conjunctivae and EOM are normal. Pupils are equal, round, and reactive to light.  Neck: Neck supple.  Cardiovascular: Normal rate, regular rhythm and normal heart sounds.   Pulmonary/Chest:  Rhonchi at left base.  Abdominal: Soft. Bowel sounds  are normal.  Musculoskeletal:  Trace edema in legs.  Neurological: She is alert and oriented to person, place, and time.  No sensory or motor deficit.  Skin: Skin is warm and dry.  Psychiatric: She has a normal mood and affect. Her behavior is normal.    ED Course  Procedures (including critical care time)  Labs Reviewed  CULTURE, BLOOD (ROUTINE X 2)  CULTURE, BLOOD (ROUTINE X 2)  CBC WITH DIFFERENTIAL  COMPREHENSIVE METABOLIC PANEL  URINALYSIS, ROUTINE W REFLEX MICROSCOPIC  PROTIME-INR  APTT  LACTIC ACID, PLASMA  BLOOD GAS, ARTERIAL   4:55 PM Pt was seen and had physical examination.  Lab workup was ordered.  Her family said that she had been to [facility] on Tuesday, 4 days ago, and had stenting of her AV shunt, and since then has gotten sick with a bad cough.   Date: 04/25/2013  Rate: 112  Rhythm: sinus tachycardia  QRS Axis: left  Intervals: normal QRS: Poor R wave progression in precordial leads suggests old anterior myocardial infarction.  ST/T Wave abnormalities: normal  Conduction Disutrbances:none  Narrative Interpretation: Abnormal EKG  Old EKG Reviewed: changes noted--rate more rapid than on tracing done on 03/31/2009.  Results for orders placed during the hospital encounter of 04/25/13  CULTURE, BLOOD (ROUTINE X 2)      Result Value Range   Specimen Description BLOOD RIGHT ARM     Special Requests       Value: BOTTLES DRAWN AEROBIC AND ANAEROBIC 6CC EACH BOTTLE   Culture PENDING     Report Status PENDING    CULTURE, BLOOD (ROUTINE X 2)      Result Value Range   Specimen Description BLOOD RIGHT ARM     Special Requests       Value: BOTTLES DRAWN AEROBIC AND ANAEROBIC 6CC EACH BOTTLE   Culture PENDING     Report Status PENDING    CBC WITH DIFFERENTIAL      Result Value Range   WBC 10.3  4.0 - 10.5 K/uL   RBC 3.73 (*) 3.87 - 5.11 MIL/uL   Hemoglobin 11.4 (*) 12.0 - 15.0 g/dL   HCT 11.9 (*) 14.7 - 82.9 %   MCV 93.0  78.0 - 100.0 fL   MCH 30.6  26.0 - 34.0  pg   MCHC 32.9  30.0 - 36.0 g/dL   RDW 56.2 (*) 13.0 - 86.5 %   Platelets 173  150 - 400 K/uL   Neutrophils Relative % 83 (*) 43 - 77 %  Neutro Abs 8.6 (*) 1.7 - 7.7 K/uL   Lymphocytes Relative 12  12 - 46 %   Lymphs Abs 1.2  0.7 - 4.0 K/uL   Monocytes Relative 5  3 - 12 %   Monocytes Absolute 0.5  0.1 - 1.0 K/uL   Eosinophils Relative 0  0 - 5 %   Eosinophils Absolute 0.0  0.0 - 0.7 K/uL   Basophils Relative 1  0 - 1 %   Basophils Absolute 0.1  0.0 - 0.1 K/uL  COMPREHENSIVE METABOLIC PANEL      Result Value Range   Sodium 134 (*) 135 - 145 mEq/L   Potassium 3.4 (*) 3.5 - 5.1 mEq/L   Chloride 91 (*) 96 - 112 mEq/L   CO2 26  19 - 32 mEq/L   Glucose, Bld 151 (*) 70 - 99 mg/dL   BUN 36 (*) 6 - 23 mg/dL   Creatinine, Ser 1.61 (*) 0.50 - 1.10 mg/dL   Calcium 9.5  8.4 - 09.6 mg/dL   Total Protein 8.2  6.0 - 8.3 g/dL   Albumin 3.5  3.5 - 5.2 g/dL   AST 18  0 - 37 U/L   ALT 5  0 - 35 U/L   Alkaline Phosphatase 74  39 - 117 U/L   Total Bilirubin 0.5  0.3 - 1.2 mg/dL   GFR calc non Af Amer 7 (*) >90 mL/min   GFR calc Af Amer 8 (*) >90 mL/min  PROTIME-INR      Result Value Range   Prothrombin Time 14.7  11.6 - 15.2 seconds   INR 1.17  0.00 - 1.49  APTT      Result Value Range   aPTT 30  24 - 37 seconds  BLOOD GAS, ARTERIAL      Result Value Range   O2 Content 3.0     Delivery systems NASAL CANNULA     pH, Arterial 7.402  7.350 - 7.450   pCO2 arterial 37.0  35.0 - 45.0 mmHg   pO2, Arterial 73.8 (*) 80.0 - 100.0 mmHg   Bicarbonate 22.6  20.0 - 24.0 mEq/L   TCO2 20.7  0 - 100 mmol/L   Acid-base deficit 1.5  0.0 - 2.0 mmol/L   O2 Saturation 93.7     Patient temperature 37.0     Collection site RIGHT RADIAL     Drawn by COLLECTED BY RT     Sample type ARTERIAL     Allens test (pass/fail) PASS  PASS   Dg Chest Port 1 View  04/25/2013   *RADIOLOGY REPORT*  Clinical Data: Cough for the past 4 days.  Suspected pneumonia.  PORTABLE CHEST - 1 VIEW  Comparison: Chest x-ray  11/18/2012.  Findings: Dense opacity throughout the left mid and lower hemithorax concerning for extensive airspace consolidation in the left lower lobe, likely with a left parapneumonic pleural effusion. Diffuse interstitial prominence throughout the right lung.  Heart size appears enlarged, but is largely obscured. The patient is rotated to the left on today's exam, resulting in distortion of the mediastinal contours and reduced diagnostic sensitivity and specificity for mediastinal pathology.  Atherosclerosis of the thoracic aorta. A vascular stent is noted in the left subclavian and axillary region.  IMPRESSION: 1.  Opacity throughout the left mid and lower hemithorax concerning for left lower lobe airspace consolidation, likely from pneumonia, with moderate to large left parapneumonic pleural effusion. 2.  Diffuse interstitial prominence throughout the right lung may represent early endobronchial spread of infection.  3.  Probable cardiomegaly. 4.  Atherosclerosis.   Original Report Authenticated By: Trudie Reed, M.D.    5:53 PM Lab workup shows LLL pneumonia.  Call to [facility] to admit her for IV antibiotics.    6:17 PM Case discussed with Virginia Rochester, M.D.  Rx with Rocephin and zithromax, admit to a telemetry bed to [facility].  1. CAP (community acquired pneumonia)   2. End stage renal failure on dialysis           Carleene Cooper III, MD 04/25/13 1818     Carleene Cooper III, MD 05/07/13 [phone]

## 2013-04-26 LAB — BLOOD GAS, ARTERIAL
Bicarbonate: 28.9 mEq/L — ABNORMAL HIGH (ref 20.0–24.0)
O2 Saturation: 98.4 %
Patient temperature: 37
TCO2: 26 mmol/L (ref 0–100)

## 2013-04-26 LAB — BASIC METABOLIC PANEL
BUN: 19 mg/dL (ref 6–23)
Calcium: 8.3 mg/dL — ABNORMAL LOW (ref 8.4–10.5)
Chloride: 96 mEq/L (ref 96–112)
Creatinine, Ser: 3.51 mg/dL — ABNORMAL HIGH (ref 0.50–1.10)
GFR calc Af Amer: 13 mL/min — ABNORMAL LOW (ref >90)

## 2013-04-26 LAB — PHOSPHORUS: Phosphorus: 3 mg/dL (ref 2.3–4.6)

## 2013-04-26 LAB — CBC
HCT: 30.8 % — ABNORMAL LOW (ref 36.0–46.0)
MCHC: 32.5 g/dL (ref 30.0–36.0)
MCV: 93.3 fL (ref 78.0–100.0)
Platelets: 123 10*3/uL — ABNORMAL LOW (ref 150–400)
RDW: 17.7 % — ABNORMAL HIGH (ref 11.5–15.5)

## 2013-04-26 LAB — HIV ANTIBODY (ROUTINE TESTING W REFLEX): HIV: NONREACTIVE

## 2013-04-26 MED ORDER — EPOETIN ALFA 10000 UNIT/ML IJ SOLN
10000.0000 [IU] | INTRAMUSCULAR | Status: DC
  Administered 2013-04-27 – 2013-04-29 (×2): 10000 [IU] via INTRAVENOUS
  Filled 2013-04-26 (×3): qty 1

## 2013-04-26 MED ORDER — LEVALBUTEROL HCL 1.25 MG/0.5ML IN NEBU
1.2500 mg | INHALATION_SOLUTION | RESPIRATORY_TRACT | Status: DC
  Administered 2013-04-26 – 2013-04-30 (×18): 1.25 mg via RESPIRATORY_TRACT
  Filled 2013-04-26 (×19): qty 0.5

## 2013-04-26 MED ORDER — VANCOMYCIN HCL IN DEXTROSE 750-5 MG/150ML-% IV SOLN
750.0000 mg | INTRAVENOUS | Status: DC
  Administered 2013-04-27 – 2013-04-29 (×2): 750 mg via INTRAVENOUS
  Filled 2013-04-26 (×3): qty 150

## 2013-04-26 MED ORDER — IPRATROPIUM BROMIDE 0.02 % IN SOLN
0.5000 mg | RESPIRATORY_TRACT | Status: DC
  Administered 2013-04-26 – 2013-04-30 (×18): 0.5 mg via RESPIRATORY_TRACT
  Filled 2013-04-26 (×19): qty 2.5

## 2013-04-26 NOTE — Progress Notes (Signed)
Subjective: Interval History: has no complaint of nausea or vomiting. Her breathing has improved and she has less cough..  Objective: Vital signs in last 24 hours: Temp:  [97.2 F (36.2 C)-100.8 F (38.2 C)] 100.8 F (38.2 C) (06/08 0405) Pulse Rate:  [94-166] 94 (06/08 0500) Resp:  [24-34] 24 (06/08 0500) BP: (100-172)/(45-90) 100/46 mmHg (06/08 0500) SpO2:  [91 %-100 %] 98 % (06/08 0713) FiO2 (%):  [40 %] 40 % (06/07 2030) Weight:  [70.308 kg (155 lb)-74.7 kg (164 lb 10.9 oz)] 74.7 kg (164 lb 10.9 oz) (06/08 0500) Weight change:   Intake/Output from previous day: 06/07 0701 - 06/08 0700 In: 1527.5 [I.V.:677.5; IV Piggyback:850] Out: 2600  Intake/Output this shift:    General appearance: alert, cooperative and no distress Resp: diminished breath sounds bilaterally Cardio: regular rate and rhythm, S1, S2 normal, no murmur, click, rub or gallop GI: soft, non-tender; bowel sounds normal; no masses,  no organomegaly Extremities: extremities normal, atraumatic, no cyanosis or edema  Lab Results:  Recent Labs  04/25/13 1715 04/26/13 0514  WBC 10.3 8.7  HGB 11.4* 10.0*  HCT 34.7* 30.8*  PLT 173 123*   BMET:  Recent Labs  04/25/13 1715 04/26/13 0514  NA 134* 136  K 3.4* 3.5  CL 91* 96  CO2 26 31  GLUCOSE 151* 130*  BUN 36* 19  CREATININE 5.16* 3.51*  CALCIUM 9.5 8.3*   No results found for this basename: PTH,  in the last 72 hours Iron Studies: No results found for this basename: IRON, TIBC, TRANSFERRIN, FERRITIN,  in the last 72 hours  Studies/Results: Dg Chest Port 1 View  04/25/2013   *RADIOLOGY REPORT*  Clinical Data: 79 year old female with pneumonia and effusion. Cough.  PORTABLE CHEST - 1 VIEW  Comparison: 04/25/2013 and earlier.  Findings: Portable semi upright AP view at 1947 hours.  The patient is not slightly more rotated to the left.  Stable lung volumes. Stable cardiomegaly and mediastinal contours.  Left subclavian/axillary vein stent graft again  noted.  No significant interval change in dense retrocardiac opacity felt to represent a combination of consolidation and effusion.  Patchy right mid and lower lung opacity also not significantly changed.  IMPRESSION: No significant interval change.  Dense retrocardiac consolidation, left pleural effusion, and developing infection in the right lung.   Original Report Authenticated By: Erskine Speed, M.D.   Dg Chest Port 1 View  04/25/2013   *RADIOLOGY REPORT*  Clinical Data: Cough for the past 4 days.  Suspected pneumonia.  PORTABLE CHEST - 1 VIEW  Comparison: Chest x-ray 11/18/2012.  Findings: Dense opacity throughout the left mid and lower hemithorax concerning for extensive airspace consolidation in the left lower lobe, likely with a left parapneumonic pleural effusion. Diffuse interstitial prominence throughout the right lung.  Heart size appears enlarged, but is largely obscured. The patient is rotated to the left on today's exam, resulting in distortion of the mediastinal contours and reduced diagnostic sensitivity and specificity for mediastinal pathology.  Atherosclerosis of the thoracic aorta. A vascular stent is noted in the left subclavian and axillary region.  IMPRESSION: 1.  Opacity throughout the left mid and lower hemithorax concerning for left lower lobe airspace consolidation, likely from pneumonia, with moderate to large left parapneumonic pleural effusion. 2.  Diffuse interstitial prominence throughout the right lung may represent early endobronchial spread of infection. 3.  Probable cardiomegaly. 4.  Atherosclerosis.   Original Report Authenticated By: Trudie Reed, M.D.    I have reviewed the patient's  current medications.  Assessment/Plan: Problem #1 difficulty breathing possibly combination of CHF and pneumonia. Patient is status post hemodialysis last night and presently she seems to be feeling better. She still coughs but is getting better. She denies any orthopnea. Problem #2  end-stage renal disease her BUN and creatinine was in acceptable range and normal potassium patient presently doesn't have any uremic sinus symptoms. Problem #3 hypertension her blood pressure seems to be reasonably controlled Problem #4 pneumonia she is on antibiotics and patient is a febrile. Problem #5 anemia her hemoglobin and hematocrit is stable Problem #6 metabolic bone disease her calcium and phosphorus is in range. Plan: We'll make arrangements for patient to get dialysis tomorrow. We'll continue his other medications We'll start patient on Epogen.    LOS: 1 day   Kleigh Hoelzer S 04/26/2013,7:41 AM

## 2013-04-26 NOTE — Progress Notes (Signed)
Subjective: She was admitted yesterday with pneumonia probably from aspiration. She does have a pleural effusion. She says she feels much better but she still had some fever last night  Objective: Vital signs in last 24 hours: Temp:  [97.2 F (36.2 C)-100.8 F (38.2 C)] 100.8 F (38.2 C) (06/08 0405) Pulse Rate:  [94-166] 94 (06/08 0500) Resp:  [24-34] 24 (06/08 0500) BP: (100-172)/(45-90) 100/46 mmHg (06/08 0500) SpO2:  [91 %-100 %] 98 % (06/08 0713) FiO2 (%):  [40 %] 40 % (06/07 2030) Weight:  [70.308 kg (155 lb)-74.7 kg (164 lb 10.9 oz)] 74.7 kg (164 lb 10.9 oz) (06/08 0500) Weight change:     Intake/Output from previous day: 06/07 0701 - 06/08 0700 In: 1527.5 [I.V.:677.5; IV Piggyback:850] Out: 2600   PHYSICAL EXAM General appearance: alert, cooperative and Mildly confused Resp: clear to auscultation bilaterally Cardio: regular rate and rhythm, S1, S2 normal, no murmur, click, rub or gallop GI: soft, non-tender; bowel sounds normal; no masses,  no organomegaly Extremities: extremities normal, atraumatic, no cyanosis or edema  Lab Results:    Basic Metabolic Panel:  Recent Labs  16/10/96 1715 04/26/13 0514  NA 134* 136  K 3.4* 3.5  CL 91* 96  CO2 26 31  GLUCOSE 151* 130*  BUN 36* 19  CREATININE 5.16* 3.51*  CALCIUM 9.5 8.3*  PHOS  --  3.0   Liver Function Tests:  Recent Labs  04/25/13 1715  AST 18  ALT 5  ALKPHOS 74  BILITOT 0.5  PROT 8.2  ALBUMIN 3.5   No results found for this basename: LIPASE, AMYLASE,  in the last 72 hours No results found for this basename: AMMONIA,  in the last 72 hours CBC:  Recent Labs  04/25/13 1715 04/26/13 0514  WBC 10.3 8.7  NEUTROABS 8.6*  --   HGB 11.4* 10.0*  HCT 34.7* 30.8*  MCV 93.0 93.3  PLT 173 123*   Cardiac Enzymes: No results found for this basename: CKTOTAL, CKMB, CKMBINDEX, TROPONINI,  in the last 72 hours BNP: No results found for this basename: PROBNP,  in the last 72 hours D-Dimer: No  results found for this basename: DDIMER,  in the last 72 hours CBG: No results found for this basename: GLUCAP,  in the last 72 hours Hemoglobin A1C: No results found for this basename: HGBA1C,  in the last 72 hours Fasting Lipid Panel: No results found for this basename: CHOL, HDL, LDLCALC, TRIG, CHOLHDL, LDLDIRECT,  in the last 72 hours Thyroid Function Tests: No results found for this basename: TSH, T4TOTAL, FREET4, T3FREE, THYROIDAB,  in the last 72 hours Anemia Panel: No results found for this basename: VITAMINB12, FOLATE, FERRITIN, TIBC, IRON, RETICCTPCT,  in the last 72 hours Coagulation:  Recent Labs  04/25/13 1715  LABPROT 14.7  INR 1.17   Urine Drug Screen: Drugs of Abuse  No results found for this basename: labopia, cocainscrnur, labbenz, amphetmu, thcu, labbarb    Alcohol Level: No results found for this basename: ETH,  in the last 72 hours Urinalysis: No results found for this basename: COLORURINE, APPERANCEUR, LABSPEC, PHURINE, GLUCOSEU, HGBUR, BILIRUBINUR, KETONESUR, PROTEINUR, UROBILINOGEN, NITRITE, LEUKOCYTESUR,  in the last 72 hours Misc. Labs:  ABGS  Recent Labs  04/26/13 0435  PHART 7.470*  PO2ART 108.0*  TCO2 26.0  HCO3 28.9*   CULTURES Recent Results (from the past 240 hour(s))  CULTURE, BLOOD (ROUTINE X 2)     Status: None   Collection Time    04/25/13  4:45 PM  Result Value Range Status   Specimen Description BLOOD RIGHT ARM   Final   Special Requests     Final   Value: BOTTLES DRAWN AEROBIC AND ANAEROBIC 6CC EACH BOTTLE   Culture PENDING   Incomplete   Report Status PENDING   Incomplete  CULTURE, BLOOD (ROUTINE X 2)     Status: None   Collection Time    04/25/13  5:15 PM      Result Value Range Status   Specimen Description BLOOD RIGHT ARM   Final   Special Requests     Final   Value: BOTTLES DRAWN AEROBIC AND ANAEROBIC 6CC EACH BOTTLE   Culture PENDING   Incomplete   Report Status PENDING   Incomplete  MRSA PCR SCREENING      Status: None   Collection Time    04/25/13  8:51 PM      Result Value Range Status   MRSA by PCR NEGATIVE  NEGATIVE Final   Comment:            The GeneXpert MRSA Assay (FDA     approved for NASAL specimens     only), is one component of a     comprehensive MRSA colonization     surveillance program. It is not     intended to diagnose MRSA     infection nor to guide or     monitor treatment for     MRSA infections.   Studies/Results: Dg Chest Port 1 View  04/25/2013   *RADIOLOGY REPORT*  Clinical Data: 79 year old female with pneumonia and effusion. Cough.  PORTABLE CHEST - 1 VIEW  Comparison: 04/25/2013 and earlier.  Findings: Portable semi upright AP view at 1947 hours.  The patient is not slightly more rotated to the left.  Stable lung volumes. Stable cardiomegaly and mediastinal contours.  Left subclavian/axillary vein stent graft again noted.  No significant interval change in dense retrocardiac opacity felt to represent a combination of consolidation and effusion.  Patchy right mid and lower lung opacity also not significantly changed.  IMPRESSION: No significant interval change.  Dense retrocardiac consolidation, left pleural effusion, and developing infection in the right lung.   Original Report Authenticated By: Erskine Speed, M.D.   Dg Chest Port 1 View  04/25/2013   *RADIOLOGY REPORT*  Clinical Data: Cough for the past 4 days.  Suspected pneumonia.  PORTABLE CHEST - 1 VIEW  Comparison: Chest x-ray 11/18/2012.  Findings: Dense opacity throughout the left mid and lower hemithorax concerning for extensive airspace consolidation in the left lower lobe, likely with a left parapneumonic pleural effusion. Diffuse interstitial prominence throughout the right lung.  Heart size appears enlarged, but is largely obscured. The patient is rotated to the left on today's exam, resulting in distortion of the mediastinal contours and reduced diagnostic sensitivity and specificity for mediastinal pathology.   Atherosclerosis of the thoracic aorta. A vascular stent is noted in the left subclavian and axillary region.  IMPRESSION: 1.  Opacity throughout the left mid and lower hemithorax concerning for left lower lobe airspace consolidation, likely from pneumonia, with moderate to large left parapneumonic pleural effusion. 2.  Diffuse interstitial prominence throughout the right lung may represent early endobronchial spread of infection. 3.  Probable cardiomegaly. 4.  Atherosclerosis.   Original Report Authenticated By: Trudie Reed, M.D.    Medications:  Prior to Admission:  Prescriptions prior to admission  Medication Sig Dispense Refill  . donepezil (ARICEPT) 10 MG tablet Take 10 mg by mouth at  bedtime.      . memantine (NAMENDA) 10 MG tablet Take 10 mg by mouth 2 (two) times daily. For memory      . multivitamin (RENA-VIT) TABS tablet Take 1 tablet by mouth daily.      [name] torsemide (DEMADEX) 20 MG tablet Take 20 mg by mouth daily.      [name] lidocaine-prilocaine (EMLA) cream Apply 1 application topically as needed. To be applied to port site, then wrap with saran wrap prior to accessing port.       Scheduled: . sodium chloride   Intravenous STAT  . cefTRIAXone (ROCEPHIN)  IV  1 g Intravenous Q24H  . furosemide  40 mg Intravenous Q12H  . ipratropium  0.5 mg Nebulization Q4H WA  . levalbuterol  1.25 mg Nebulization Q4H WA  . piperacillin-tazobactam (ZOSYN)  IV  2.25 g Intravenous Q8H  . sodium chloride  3 mL Intravenous Q12H   Continuous:  NWG:NFAOZH chloride, sodium chloride  Assesment: She was admitted with pneumonia which is thought to be from aspiration. She is improving but still does have some fever. She has end-stage renal disease on dialysis and dialyzed on the sixth. She had some trouble with nausea and vomiting and that seems better. Principal Problem:   Aspiration pneumonia Active Problems:   ESRD (end stage renal disease)   Senile dementia   CAP (community acquired  pneumonia)    Plan: Continue IV antibiotics. She may require thoracentesis but I want to see how she does clinically.    LOS: 1 day   Thereasa Iannello L 04/26/2013, 7:39 AM

## 2013-04-26 NOTE — Progress Notes (Signed)
ANTIBIOTIC CONSULT NOTE  Pharmacy Consult for Vancomycin and Zosyn Indication: rule out pneumonia  Allergies  Allergen Reactions  . Sulfur     Patient Measurements: Height: 5\' 3"  (160 cm) Weight: 164 lb 10.9 oz (74.7 kg) IBW/kg (Calculated) : 52.4   Vital Signs: Temp: 99.7 F (37.6 C) (06/08 0751) Temp src: Oral (06/08 0751) BP: 100/46 mmHg (06/08 0500) Pulse Rate: 94 (06/08 0500) Intake/Output from previous day: 06/07 0701 - 06/08 0700 In: 1527.5 [I.V.:677.5; IV Piggyback:850] Out: 2600  Intake/Output from this shift:    Labs:  Recent Labs  04/25/13 1715 04/26/13 0514  WBC 10.3 8.7  HGB 11.4* 10.0*  PLT 173 123*  CREATININE 5.16* 3.51*   Estimated Creatinine Clearance: 12.4 ml/min (by C-G formula based on Cr of 3.51).  No results found for this basename: VANCOTROUGH, Leodis Binet, VANCORANDOM, GENTTROUGH, GENTPEAK, GENTRANDOM, TOBRATROUGH, TOBRAPEAK, TOBRARND, AMIKACINPEAK, AMIKACINTROU, AMIKACIN,  in the last 72 hours   Microbiology: Recent Results (from the past 720 hour(s))  CULTURE, BLOOD (ROUTINE X 2)     Status: None   Collection Time    04/25/13  4:45 PM      Result Value Range Status   Specimen Description BLOOD RIGHT ARM   Final   Special Requests     Final   Value: BOTTLES DRAWN AEROBIC AND ANAEROBIC 6CC EACH BOTTLE   Culture PENDING   Incomplete   Report Status PENDING   Incomplete  CULTURE, BLOOD (ROUTINE X 2)     Status: None   Collection Time    04/25/13  5:15 PM      Result Value Range Status   Specimen Description BLOOD RIGHT ARM   Final   Special Requests     Final   Value: BOTTLES DRAWN AEROBIC AND ANAEROBIC 6CC EACH BOTTLE   Culture PENDING   Incomplete   Report Status PENDING   Incomplete  MRSA PCR SCREENING     Status: None   Collection Time    04/25/13  8:51 PM      Result Value Range Status   MRSA by PCR NEGATIVE  NEGATIVE Final   Comment:            The GeneXpert MRSA Assay (FDA     approved for NASAL specimens     only),  is one component of a     comprehensive MRSA colonization     surveillance program. It is not     intended to diagnose MRSA     infection nor to guide or     monitor treatment for     MRSA infections.    Medical History: Past Medical History  Diagnosis Date  . Renal disorder     Medications:  Prescriptions prior to admission  Medication Sig Dispense Refill  . donepezil (ARICEPT) 10 MG tablet Take 10 mg by mouth at bedtime.      . memantine (NAMENDA) 10 MG tablet Take 10 mg by mouth 2 (two) times daily. For memory      . multivitamin (RENA-VIT) TABS tablet Take 1 tablet by mouth daily.      [name] torsemide (DEMADEX) 20 MG tablet Take 20 mg by mouth daily.      [name] lidocaine-prilocaine (EMLA) cream Apply 1 application topically as needed. To be applied to port site, then wrap with saran wrap prior to accessing port.       Assessment: Okay for Protocol ESRD patient being treated for suspected PNA  Goal of Therapy:  Pre-Hemodialysis Vancomycin level  goal range =15-25 mcg/ml  Plan:  Vancomycin 750mg  IV every HD (MWF) Zosyn 2.25 gm IV every 8 hours Measure antibiotic drug levels at steady state Follow up culture results  Mady Gemma 04/26/2013,9:43 AM

## 2013-04-26 NOTE — Plan of Care (Signed)
Problem: Consults Goal: Pneumonia Patient Education See Patient Educatio Module for education specifics.  Outcome: Progressing Pt given pneumonia education sheets    Problem: Phase I Progression Outcomes Goal: Dyspnea controlled at rest Outcome: Progressing Pt now on [place] 4 liters with sats 97% & respirations w/o distress Goal: First antibiotic given within 6hrs of admit Outcome: Progressing Pt arrived in ED 1642 & to ICU by 2000 & antibiotic begun by around 2045 Goal: Confirm chest x-ray completed Outcome: Progressing Portable chest showed left  pleural effusion & pneumonia Goal: Voiding-avoid urinary catheter unless indicated Outcome: Not Progressing Pt is a dialysis pt & was dialysed  Here in ICU last night.

## 2013-04-26 NOTE — Progress Notes (Signed)
Patient breath sounds Rhonchi/Rales on right and left diminished and congestion in left upper lobe. Weak cough, unable to cough anything up for sputum culture.  Bladder scanned for any UOP as none since admission.  No urine present in bladder. Patient states she is not doing very well. Will monitor closely. Oxygen in upper 90's on 4 liters. RR is 27.

## 2013-04-26 NOTE — Procedures (Signed)
   HEMODIALYSIS TREATMENT NOTE:  3.75 urgent HD session completed through left upper arm AVF (15g-ante/retrograde).  Prescribed goal was 3L.  Given NS flushes and 850cc of IV antibiotics, net UF was 2.6 L.  All blood was reinfused.  Hemostasis was achieved within 15 minutes.  Report given to Robyne Peers, RN

## 2013-04-27 ENCOUNTER — Inpatient Hospital Stay (HOSPITAL_COMMUNITY): Payer: Medicare HMO

## 2013-04-27 LAB — BASIC METABOLIC PANEL
BUN: 31 mg/dL — ABNORMAL HIGH (ref 6–23)
CO2: 31 mEq/L (ref 19–32)
Calcium: 8.3 mg/dL — ABNORMAL LOW (ref 8.4–10.5)
Chloride: 98 mEq/L (ref 96–112)
Creatinine, Ser: 5.15 mg/dL — ABNORMAL HIGH (ref 0.50–1.10)
Glucose, Bld: 91 mg/dL (ref 70–99)

## 2013-04-27 LAB — GLUCOSE, SEROUS FLUID: Glucose, Fluid: 121 mg/dL

## 2013-04-27 LAB — CBC
HCT: 28.4 % — ABNORMAL LOW (ref 36.0–46.0)
MCH: 30.9 pg (ref 26.0–34.0)
MCHC: 32.7 g/dL (ref 30.0–36.0)
MCV: 94.4 fL (ref 78.0–100.0)
Platelets: 103 10*3/uL — ABNORMAL LOW (ref 150–400)
RDW: 18.2 % — ABNORMAL HIGH (ref 11.5–15.5)
WBC: 6.8 10*3/uL (ref 4.0–10.5)

## 2013-04-27 LAB — PROTEIN, BODY FLUID: Total protein, fluid: 3.2 g/dL

## 2013-04-27 LAB — BODY FLUID CELL COUNT WITH DIFFERENTIAL
Lymphs, Fluid: 67 %
Monocyte-Macrophage-Serous Fluid: 31 % — ABNORMAL LOW (ref 50–90)

## 2013-04-27 LAB — LACTATE DEHYDROGENASE, PLEURAL OR PERITONEAL FLUID

## 2013-04-27 MED ORDER — SODIUM CHLORIDE 0.9 % IV SOLN: 100.0000 mL | INTRAVENOUS | Status: DC | PRN

## 2013-04-27 MED ORDER — NEPRO/CARBSTEADY PO LIQD: 237.0000 mL | ORAL | Status: DC | PRN

## 2013-04-27 MED ORDER — LIDOCAINE HCL (PF) 1 % IJ SOLN: 5.0000 mL | INTRAMUSCULAR | Status: DC | PRN

## 2013-04-27 MED ORDER — HEPARIN SODIUM (PORCINE) 1000 UNIT/ML DIALYSIS
20.0000 [IU]/kg | INTRAMUSCULAR | Status: DC | PRN
  Administered 2013-04-29: 1500 [IU] via INTRAVENOUS_CENTRAL
  Filled 2013-04-27: qty 2

## 2013-04-27 MED ORDER — HEPARIN SODIUM (PORCINE) 1000 UNIT/ML DIALYSIS
300.0000 [IU] | INTRAMUSCULAR | Status: DC | PRN
  Filled 2013-04-27: qty 1

## 2013-04-27 MED ORDER — LIDOCAINE-PRILOCAINE 2.5-2.5 % EX CREA
1.0000 "application " | TOPICAL_CREAM | CUTANEOUS | Status: DC | PRN
  Filled 2013-04-27: qty 5

## 2013-04-27 MED ORDER — PENTAFLUOROPROP-TETRAFLUOROETH EX AERO
1.0000 "application " | INHALATION_SPRAY | CUTANEOUS | Status: DC | PRN
  Filled 2013-04-27: qty 103.5

## 2013-04-27 MED ORDER — ALTEPLASE 2 MG IJ SOLR
2.0000 mg | Freq: Once | INTRAMUSCULAR | Status: AC | PRN
  Filled 2013-04-27: qty 2

## 2013-04-27 MED ORDER — HEPARIN SODIUM (PORCINE) 1000 UNIT/ML DIALYSIS
1000.0000 [IU] | INTRAMUSCULAR | Status: DC | PRN
  Filled 2013-04-27: qty 1

## 2013-04-27 NOTE — Progress Notes (Signed)
Subjective: She seems to be doing better now than when Dr. Kristian Covey saw her. She has less complaint of being short of breath but she has had a nebulizer treatment.  Objective: Vital signs in last 24 hours: Temp:  [98.5 F (36.9 C)-99.5 F (37.5 C)] 98.8 F (37.1 C) (06/09 0400) Pulse Rate:  [37-102] 79 (06/09 0700) Resp:  [18-31] 23 (06/09 0700) BP: (96-124)/(36-93) 102/64 mmHg (06/09 0700) SpO2:  [85 %-100 %] 100 % (06/09 0700) Weight:  [75.3 kg (166 lb 0.1 oz)] 75.3 kg (166 lb 0.1 oz) (06/09 0500) Weight change: 4.992 kg (11 lb 0.1 oz) Last BM Date: 04/25/13  Intake/Output from previous day: 06/08 0701 - 06/09 0700 In: 525 [P.O.:375; IV Piggyback:150] Out: -   PHYSICAL EXAM General appearance: alert, cooperative, mild distress and Confused Resp: rhonchi bilaterally Cardio: regular rate and rhythm, S1, S2 normal, no murmur, click, rub or gallop GI: soft, non-tender; bowel sounds normal; no masses,  no organomegaly Extremities: edema Trace  Lab Results:    Basic Metabolic Panel:  Recent Labs  13/08/65 0514 04/27/13 0444  NA 136 138  K 3.5 3.6  CL 96 98  CO2 31 31  GLUCOSE 130* 91  BUN 19 31*  CREATININE 3.51* 5.15*  CALCIUM 8.3* 8.3*  PHOS 3.0  --    Liver Function Tests:  Recent Labs  04/25/13 1715  AST 18  ALT 5  ALKPHOS 74  BILITOT 0.5  PROT 8.2  ALBUMIN 3.5   No results found for this basename: LIPASE, AMYLASE,  in the last 72 hours No results found for this basename: AMMONIA,  in the last 72 hours CBC:  Recent Labs  04/25/13 1715 04/26/13 0514 04/27/13 0444  WBC 10.3 8.7 6.8  NEUTROABS 8.6*  --   --   HGB 11.4* 10.0* 9.3*  HCT 34.7* 30.8* 28.4*  MCV 93.0 93.3 94.4  PLT 173 123* 103*   Cardiac Enzymes: No results found for this basename: CKTOTAL, CKMB, CKMBINDEX, TROPONINI,  in the last 72 hours BNP: No results found for this basename: PROBNP,  in the last 72 hours D-Dimer: No results found for this basename: DDIMER,  in the last  72 hours CBG: No results found for this basename: GLUCAP,  in the last 72 hours Hemoglobin A1C: No results found for this basename: HGBA1C,  in the last 72 hours Fasting Lipid Panel: No results found for this basename: CHOL, HDL, LDLCALC, TRIG, CHOLHDL, LDLDIRECT,  in the last 72 hours Thyroid Function Tests: No results found for this basename: TSH, T4TOTAL, FREET4, T3FREE, THYROIDAB,  in the last 72 hours Anemia Panel: No results found for this basename: VITAMINB12, FOLATE, FERRITIN, TIBC, IRON, RETICCTPCT,  in the last 72 hours Coagulation:  Recent Labs  04/25/13 1715  LABPROT 14.7  INR 1.17   Urine Drug Screen: Drugs of Abuse  No results found for this basename: labopia, cocainscrnur, labbenz, amphetmu, thcu, labbarb    Alcohol Level: No results found for this basename: ETH,  in the last 72 hours Urinalysis: No results found for this basename: COLORURINE, APPERANCEUR, LABSPEC, PHURINE, GLUCOSEU, HGBUR, BILIRUBINUR, KETONESUR, PROTEINUR, UROBILINOGEN, NITRITE, LEUKOCYTESUR,  in the last 72 hours Misc. Labs:  ABGS  Recent Labs  04/26/13 0435  PHART 7.470*  PO2ART 108.0*  TCO2 26.0  HCO3 28.9*   CULTURES Recent Results (from the past 240 hour(s))  CULTURE, BLOOD (ROUTINE X 2)     Status: None   Collection Time    04/25/13  4:45 PM  Result Value Range Status   Specimen Description BLOOD RIGHT ARM   Final   Special Requests     Final   Value: BOTTLES DRAWN AEROBIC AND ANAEROBIC 6CC EACH BOTTLE   Culture PENDING   Incomplete   Report Status PENDING   Incomplete  CULTURE, BLOOD (ROUTINE X 2)     Status: None   Collection Time    04/25/13  5:15 PM      Result Value Range Status   Specimen Description BLOOD RIGHT ARM   Final   Special Requests     Final   Value: BOTTLES DRAWN AEROBIC AND ANAEROBIC 6CC EACH BOTTLE   Culture PENDING   Incomplete   Report Status PENDING   Incomplete  MRSA PCR SCREENING     Status: None   Collection Time    04/25/13  8:51 PM       Result Value Range Status   MRSA by PCR NEGATIVE  NEGATIVE Final   Comment:            The GeneXpert MRSA Assay (FDA     approved for NASAL specimens     only), is one component of a     comprehensive MRSA colonization     surveillance program. It is not     intended to diagnose MRSA     infection nor to guide or     monitor treatment for     MRSA infections.   Studies/Results: Dg Chest Port 1 View  04/25/2013   *RADIOLOGY REPORT*  Clinical Data: 79 year old female with pneumonia and effusion. Cough.  PORTABLE CHEST - 1 VIEW  Comparison: 04/25/2013 and earlier.  Findings: Portable semi upright AP view at 1947 hours.  The patient is not slightly more rotated to the left.  Stable lung volumes. Stable cardiomegaly and mediastinal contours.  Left subclavian/axillary vein stent graft again noted.  No significant interval change in dense retrocardiac opacity felt to represent a combination of consolidation and effusion.  Patchy right mid and lower lung opacity also not significantly changed.  IMPRESSION: No significant interval change.  Dense retrocardiac consolidation, left pleural effusion, and developing infection in the right lung.   Original Report Authenticated By: Erskine Speed, M.D.   Dg Chest Port 1 View  04/25/2013   *RADIOLOGY REPORT*  Clinical Data: Cough for the past 4 days.  Suspected pneumonia.  PORTABLE CHEST - 1 VIEW  Comparison: Chest x-ray 11/18/2012.  Findings: Dense opacity throughout the left mid and lower hemithorax concerning for extensive airspace consolidation in the left lower lobe, likely with a left parapneumonic pleural effusion. Diffuse interstitial prominence throughout the right lung.  Heart size appears enlarged, but is largely obscured. The patient is rotated to the left on today's exam, resulting in distortion of the mediastinal contours and reduced diagnostic sensitivity and specificity for mediastinal pathology.  Atherosclerosis of the thoracic aorta. A vascular stent  is noted in the left subclavian and axillary region.  IMPRESSION: 1.  Opacity throughout the left mid and lower hemithorax concerning for left lower lobe airspace consolidation, likely from pneumonia, with moderate to large left parapneumonic pleural effusion. 2.  Diffuse interstitial prominence throughout the right lung may represent early endobronchial spread of infection. 3.  Probable cardiomegaly. 4.  Atherosclerosis.   Original Report Authenticated By: Trudie Reed, M.D.    Medications:  Prior to Admission:  Prescriptions prior to admission  Medication Sig Dispense Refill  . donepezil (ARICEPT) 10 MG tablet Take 10 mg by mouth at  bedtime.      . memantine (NAMENDA) 10 MG tablet Take 10 mg by mouth 2 (two) times daily. For memory      . multivitamin (RENA-VIT) TABS tablet Take 1 tablet by mouth daily.      [name] torsemide (DEMADEX) 20 MG tablet Take 20 mg by mouth daily.      [name] lidocaine-prilocaine (EMLA) cream Apply 1 application topically as needed. To be applied to port site, then wrap with saran wrap prior to accessing port.       Scheduled: . epoetin alfa  10,000 Units Intravenous Q M,W,F-HD  . furosemide  40 mg Intravenous Q12H  . ipratropium  0.5 mg Nebulization Q4H WA  . levalbuterol  1.25 mg Nebulization Q4H WA  . piperacillin-tazobactam (ZOSYN)  IV  2.25 g Intravenous Q8H  . sodium chloride  3 mL Intravenous Q12H  . vancomycin  750 mg Intravenous Q M,W,F-HD   Continuous:  WUJ:WJXBJY chloride, sodium chloride  Assesment: She was admitted with shortness of breath probably aspiration pneumonia and she seemed to have some volume overloaded as well. She has a pleural effusion. She has improved some. She has end-stage renal disease on dialysis. Principal Problem:   Aspiration pneumonia Active Problems:   ESRD (end stage renal disease)   Senile dementia   CAP (community acquired pneumonia)    Plan: I will ask radiology to consider thoracentesis. PT consultation. I think she  can move from the ICU.    LOS: 2 days   Kaelon Weekes L 04/27/2013, 7:59 AM

## 2013-04-27 NOTE — Progress Notes (Signed)
Subjective: Interval History: has no complaint of nausea or vomiting. She continue to have cough. She complains of also difficulty breathing as compared to yesterday.  Objective: Vital signs in last 24 hours: Temp:  [98.5 F (36.9 C)-99.7 F (37.6 C)] 98.8 F (37.1 C) (06/09 0400) Pulse Rate:  [37-102] 75 (06/09 0400) Resp:  [18-31] 20 (06/09 0400) BP: (96-124)/(40-93) 99/40 mmHg (06/09 0400) SpO2:  [85 %-100 %] 100 % (06/09 0647) Weight:  [75.3 kg (166 lb 0.1 oz)] 75.3 kg (166 lb 0.1 oz) (06/09 0500) Weight change: 4.992 kg (11 lb 0.1 oz)  Intake/Output from previous day: 06/08 0701 - 06/09 0700 In: 525 [P.O.:375; IV Piggyback:150] Out: -  Intake/Output this shift:    Generally patient somnolent but arousable. She seems to have labored breathing with occasional cough. Chest she has bilateral inspiratory crackles posteriorly and also expiratory wheezing. Heart exam revealed regular rate and rhythm no murmur Abdomen soft positive bowel sound Extremities trace edema  Lab Results:  Recent Labs  04/26/13 0514 04/27/13 0444  WBC 8.7 6.8  HGB 10.0* 9.3*  HCT 30.8* 28.4*  PLT 123* 103*   BMET:   Recent Labs  04/26/13 0514 04/27/13 0444  NA 136 138  K 3.5 3.6  CL 96 98  CO2 31 31  GLUCOSE 130* 91  BUN 19 31*  CREATININE 3.51* 5.15*  CALCIUM 8.3* 8.3*   No results found for this basename: PTH,  in the last 72 hours [place] Studies: No results found for this basename: IRON, TIBC, TRANSFERRIN, FERRITIN,  in the last 72 hours  Studies/Results: Dg Chest Port 1 View  04/25/2013   *RADIOLOGY REPORT*  Clinical Data: 80 year old female with pneumonia and effusion. Cough.  PORTABLE CHEST - 1 VIEW  Comparison: 04/25/2013 and earlier.  Findings: Portable semi upright AP view at 1947 hours.  The patient is not slightly more rotated to the left.  Stable lung volumes. Stable cardiomegaly and mediastinal contours.  Left subclavian/axillary vein stent graft again noted.  No significant  interval change in dense retrocardiac opacity felt to represent a combination of consolidation and effusion.  Patchy right mid and lower lung opacity also not significantly changed.  IMPRESSION: No significant interval change.  Dense retrocardiac consolidation, left pleural effusion, and developing infection in the right lung.   Original Report Authenticated By: Erskine Speed, M.D.   Dg Chest Port 1 View  04/25/2013   *RADIOLOGY REPORT*  Clinical Data: Cough for the past 4 days.  Suspected pneumonia.  PORTABLE CHEST - 1 VIEW  Comparison: Chest x-ray 11/18/2012.  Findings: Dense opacity throughout the left mid and lower hemithorax concerning for extensive airspace consolidation in the left lower lobe, likely with a left parapneumonic pleural effusion. Diffuse interstitial prominence throughout the right lung.  Heart size appears enlarged, but is largely obscured. The patient is rotated to the left on today's exam, resulting in distortion of the mediastinal contours and reduced diagnostic sensitivity and specificity for mediastinal pathology.  Atherosclerosis of the thoracic aorta. A vascular stent is noted in the left subclavian and axillary region.  IMPRESSION: 1.  Opacity throughout the left mid and lower hemithorax concerning for left lower lobe airspace consolidation, likely from pneumonia, with moderate to large left parapneumonic pleural effusion. 2.  Diffuse interstitial prominence throughout the right lung may represent early endobronchial spread of infection. 3.  Probable cardiomegaly. 4.  Atherosclerosis.   Original Report Authenticated By: Trudie Reed, M.D.    I have reviewed the patient's current medications.  Assessment/Plan: Problem #1 difficulty breathing possibly combination of CHF and pneumonia. Patient is status post hemodialysis yesterday. Presently patient seems to have difficulty in breathing and orthopnea. Problem #2 end-stage renal disease her BUN and creatinine was in acceptable  range and normal potassium patient presently doesn't have any uremic sign and symptoms. Problem #3 hypertension her blood pressure seems to be reasonably controlled Problem #4 pneumonia she is on antibiotics and patient is a febrile. Chest x-ray without significant improvement. Patient also has parapneumonic pleural effusion. Problem #5 anemia her hemoglobin and hematocrit is stable Problem #6 metabolic bone disease her calcium and phosphorus is in range. Plan: Will  Dialyze patient today If her her blood pressure tolerates we'll try to remove about 3 to 3 and half liters. If the fluid removal doesn't help possibly patient may benefit from thoracentesis of her effusion. At this moment patient is fragile We'll continue his other medications We'll check her basic metabolic panel and CBC in the morning    LOS: 2 days   Meah Jiron S 04/27/2013,7:14 AM

## 2013-04-27 NOTE — Progress Notes (Signed)
Lidocaine 1%              4mL injected                        pleural fluid removed

## 2013-04-27 NOTE — Progress Notes (Signed)
PT Cancellation Note  Patient Details Name: [name] MRN: [id number] DOB: 05/29/1931   Cancelled Treatment:    Reason Eval/Treat Not Completed: Patient at procedure or test/unavailable Pt is receiving dialysis most of the day.  Will defer PT eval until tomorrow.  Myrlene Broker L 04/27/2013, 10:39 AM

## 2013-04-27 NOTE — Clinical Documentation Improvement (Signed)
RESPIRATORY FAILURE DOCUMENTATION CLARIFICATION QUERY   THIS DOCUMENT IS NOT A PERMANENT PART OF THE MEDICAL RECORD  TO RESPOND TO THE THIS QUERY, FOLLOW THE INSTRUCTIONS BELOW:  1. If needed, update documentation for the patient's encounter via the notes activity.  2. Access this query again and click edit on the In [employer].  3. After updating, or not, click F2 to complete all highlighted (required) fields concerning your review. Select "additional documentation in the medical record" OR "no additional documentation provided".  4. Click Sign note button.  5. The deficiency will fall out of your In Basket *Please let us know if you are not able to complete this workflow by phone or e-mail (listed below).  Please update your documentation within the medical record to reflect your response to this query.                                                                                    04/27/13  Dear Dr. Juanetta Gosling Marton Redwood,  In a better effort to capture your patient's severity of illness, reflect appropriate length of stay and utilization of resources, a review of the patient medical record has revealed the following indicators.   Based on your clinical judgment, please clarify and document in a progress note and/or discharge summary the clinical condition associated with the following supporting information: In responding to this query please exercise your independent judgment.  The fact that a query is asked, does not imply that any particular answer is desired or expected.  Please clarify respiratory status. Thank you.  Possible Clinical Conditions?  Acute Respiratory Failure Acute on Chronic Respiratory Failure Chronic Respiratory Failure Other Condition________________ Cannot Clinically Determine    Supporting Information:  Risk Factors: Advanced Age ESRD Diagnosed with Aspiration pneumonia Pleural effusion  Signs&Symptoms: Shortness of breath Cough Labored  Breathing with Resp ranging 16-34 Tachycardia  Radiology: 6/7 CXR: IMPRESSION:  1. Opacity throughout the left mid and lower hemithorax concerning  for left lower lobe airspace consolidation, likely from pneumonia,  with moderate to large left parapneumonic pleural effusion.  2. Diffuse interstitial prominence throughout the right lung may  represent early endobronchial spread of infection.  3. Probable cardiomegaly.  4. Atherosclerosis.  Treatment:  Atrovent neb q4h while awake  Xopenex neb q4h while awake  Pulse ox. Continuous O2 2L/min via [place]; Keep sats greater than 92%           You may use possible, probable, or suspect with inpatient documentation. possible, probable, suspected diagnoses MUST be documented at the time of discharge  Reviewed: additional documentation in the medical record  Thank You,  Debora T Williams RN, MSN Clinical Documentation Specialist: Office# [phone] [facility] Health Information Management [place]

## 2013-04-27 NOTE — Procedures (Signed)
PreOperative Dx: LEFT pleural effusion Postoperative Dx: LEFT pleural effusion Procedure:   US guided LEFT thoracentesis Radiologist:  Tyron Russell Anesthesia:  4.5 ml of 1% lidocaine Specimen:  375 ml of serous fluid EBL:   < 1 ml Complications: None

## 2013-04-27 NOTE — Care Management Note (Signed)
    Page 1 of 1   04/30/2013     11:36:42 AM   CARE MANAGEMENT NOTE 04/30/2013  Patient:  [name], [name]   Account Number:  [id number]  Date Initiated:  04/27/2013  Documentation initiated by:  Anibal Henderson  Subjective/Objective Assessment:   Pt admitted with aspiration PNA, Dialysis pt- having dialysis today and then to x-ray for thoracenthesis- will try to speak with pt and family tomorrow. Lives at home with family     Action/Plan:   Anticipated DC Date:  04/30/2013   Anticipated DC Plan:  SKILLED NURSING FACILITY  In-house referral  Clinical Social Worker      DC Planning Services  CM consult      Choice offered to / List presented to:             Status of service:  Completed, signed off Medicare Important Message given?  YES (If response is "NO", the following Medicare IM given date fields will be blank) Date Medicare IM given:  04/30/2013 Date Additional Medicare IM given:    Discharge Disposition:  SKILLED NURSING FACILITY  Per UR Regulation:  Reviewed for med. necessity/level of care/duration of stay  If discussed at Long Length of Stay Meetings, dates discussed:   04/30/2013    Comments:  04/28/13 1200 Anibal Henderson RN/CM Pt plans to return home at D/C and may need HH at D/C 04/27/13 1555 [facility] RN/CM

## 2013-04-27 NOTE — Progress Notes (Signed)
UR Chart Review Completed  

## 2013-04-27 NOTE — Procedures (Signed)
   HEMODIALYSIS TREATMENT NOTE:  3.5 hour HD session completed through left upper arm AVF (15g ante/retrograde).  GOAL NOT MET:  Pt net +577cc. BP unable to tolerate removal of more than 323cc, which was removed during the first 30 minutes of tx.  After that, ultrafiltration was discontinued due to SBP in 80s.  Pt had two episodes of SBP<80, relieved with saline boluses.  She asymptomatic with low BP. All blood was reinfused.  Hemostasis achieved within 15 minutes. Breathing is still labored with occasional cough.  Pt is unable to tolerate lowering HOB, further complicating attempts at ultrafiltration.  Report given to Donah Driver, RN.

## 2013-04-28 DIAGNOSIS — J90 Pleural effusion, not elsewhere classified: Secondary | ICD-10-CM | POA: Diagnosis present

## 2013-04-28 DIAGNOSIS — E8770 Fluid overload, unspecified: Secondary | ICD-10-CM | POA: Diagnosis present

## 2013-04-28 LAB — CBC
Hemoglobin: 9.2 g/dL — ABNORMAL LOW (ref 12.0–15.0)
MCH: 29.8 pg (ref 26.0–34.0)
MCHC: 31.8 g/dL (ref 30.0–36.0)
Platelets: 116 10*3/uL — ABNORMAL LOW (ref 150–400)

## 2013-04-28 LAB — BASIC METABOLIC PANEL
BUN: 20 mg/dL (ref 6–23)
Calcium: 8.5 mg/dL (ref 8.4–10.5)
GFR calc non Af Amer: 10 mL/min — ABNORMAL LOW (ref >90)
Glucose, Bld: 92 mg/dL (ref 70–99)

## 2013-04-28 MED ORDER — DIPHENHYDRAMINE HCL 25 MG PO CAPS: 25.0000 mg | ORAL_CAPSULE | ORAL | Status: DC | PRN

## 2013-04-28 MED ORDER — DONEPEZIL HCL 5 MG PO TABS
10.0000 mg | ORAL_TABLET | Freq: Every day | ORAL | Status: DC
  Administered 2013-04-28 – 2013-04-30 (×2): 10 mg via ORAL
  Filled 2013-04-28 (×2): qty 2

## 2013-04-28 MED ORDER — SODIUM CHLORIDE 0.9 % IV SOLN: 100.0000 mL | INTRAVENOUS | Status: DC | PRN

## 2013-04-28 MED ORDER — ALTEPLASE 2 MG IJ SOLR
2.0000 mg | Freq: Once | INTRAMUSCULAR | Status: AC | PRN
  Filled 2013-04-28: qty 2

## 2013-04-28 MED ORDER — MEMANTINE HCL 10 MG PO TABS
10.0000 mg | ORAL_TABLET | Freq: Two times a day (BID) | ORAL | Status: DC
  Administered 2013-04-28 – 2013-04-30 (×4): 10 mg via ORAL
  Filled 2013-04-28 (×4): qty 1

## 2013-04-28 NOTE — Progress Notes (Signed)
Report called to Davy Pique, RN and pt transported to [phone] via wheelchair along with personal hygiene items. Family notified.

## 2013-04-28 NOTE — Progress Notes (Signed)
Subjective: She says she feels better and wants to go home. She does seem to have improved. She's not short of breath.  Objective: Vital signs in last 24 hours: Temp:  [97.5 F (36.4 C)-98.5 F (36.9 C)] 97.5 F (36.4 C) (06/10 0400) Pulse Rate:  [47-94] 86 (06/10 0600) Resp:  [16-26] 26 (06/10 0600) BP: (66-147)/(24-71) 128/61 mmHg (06/10 0600) SpO2:  [90 %-100 %] 98 % (06/10 0728) Weight:  [74 kg (163 lb 2.3 oz)-75.3 kg (166 lb 0.1 oz)] 74 kg (163 lb 2.3 oz) (06/10 0500) Weight change: 0 kg (0 lb) Last BM Date: 04/27/13  Intake/Output from previous day: 06/09 0701 - 06/10 0700 In: 590 [P.O.:440; IV Piggyback:150] Out: -577   PHYSICAL EXAM General appearance: alert, cooperative and no distress Resp: rhonchi bilaterally Cardio: regular rate and rhythm, S1, S2 normal, no murmur, click, rub or gallop GI: soft, non-tender; bowel sounds normal; no masses,  no organomegaly Extremities: extremities normal, atraumatic, no cyanosis or edema  Lab Results:    Basic Metabolic Panel:  Recent Labs  16/10/96 0514 04/27/13 0444 04/28/13 0522  NA 136 138 138  K 3.5 3.6 3.4*  CL 96 98 99  CO2 31 31 29   GLUCOSE 130* 91 92  BUN 19 31* 20  CREATININE 3.51* 5.15* 3.92*  CALCIUM 8.3* 8.3* 8.5  PHOS 3.0  --  2.4   Liver Function Tests:  Recent Labs  04/25/13 1715  AST 18  ALT 5  ALKPHOS 74  BILITOT 0.5  PROT 8.2  ALBUMIN 3.5   No results found for this basename: LIPASE, AMYLASE,  in the last 72 hours No results found for this basename: AMMONIA,  in the last 72 hours CBC:  Recent Labs  04/25/13 1715  04/27/13 0444 04/28/13 0522  WBC 10.3  < > 6.8 7.1  NEUTROABS 8.6*  --   --   --   HGB 11.4*  < > 9.3* 9.2*  HCT 34.7*  < > 28.4* 28.9*  MCV 93.0  < > 94.4 93.5  PLT 173  < > 103* 116*  < > = values in this interval not displayed. Cardiac Enzymes: No results found for this basename: CKTOTAL, CKMB, CKMBINDEX, TROPONINI,  in the last 72 hours BNP: No results found  for this basename: PROBNP,  in the last 72 hours D-Dimer: No results found for this basename: DDIMER,  in the last 72 hours CBG: No results found for this basename: GLUCAP,  in the last 72 hours Hemoglobin A1C: No results found for this basename: HGBA1C,  in the last 72 hours Fasting Lipid Panel: No results found for this basename: CHOL, HDL, LDLCALC, TRIG, CHOLHDL, LDLDIRECT,  in the last 72 hours Thyroid Function Tests: No results found for this basename: TSH, T4TOTAL, FREET4, T3FREE, THYROIDAB,  in the last 72 hours Anemia Panel: No results found for this basename: VITAMINB12, FOLATE, FERRITIN, TIBC, IRON, RETICCTPCT,  in the last 72 hours Coagulation:  Recent Labs  04/25/13 1715  LABPROT 14.7  INR 1.17   Urine Drug Screen: Drugs of Abuse  No results found for this basename: labopia, cocainscrnur, labbenz, amphetmu, thcu, labbarb    Alcohol Level: No results found for this basename: ETH,  in the last 72 hours Urinalysis: No results found for this basename: COLORURINE, APPERANCEUR, LABSPEC, PHURINE, GLUCOSEU, HGBUR, BILIRUBINUR, KETONESUR, PROTEINUR, UROBILINOGEN, NITRITE, LEUKOCYTESUR,  in the last 72 hours Misc. Labs:  ABGS  Recent Labs  04/26/13 0435  PHART 7.470*  PO2ART 108.0*  TCO2 26.0  HCO3 28.9*   CULTURES Recent Results (from the past 240 hour(s))  CULTURE, BLOOD (ROUTINE X 2)     Status: None   Collection Time    04/25/13  4:45 PM      Result Value Range Status   Specimen Description BLOOD RIGHT ARM   Final   Special Requests     Final   Value: BOTTLES DRAWN AEROBIC AND ANAEROBIC 6CC EACH BOTTLE   Culture NO GROWTH 2 DAYS   Final   Report Status PENDING   Incomplete  CULTURE, BLOOD (ROUTINE X 2)     Status: None   Collection Time    04/25/13  5:15 PM      Result Value Range Status   Specimen Description BLOOD RIGHT ARM   Final   Special Requests     Final   Value: BOTTLES DRAWN AEROBIC AND ANAEROBIC 6CC EACH BOTTLE   Culture NO GROWTH 2 DAYS    Final   Report Status PENDING   Incomplete  MRSA PCR SCREENING     Status: None   Collection Time    04/25/13  8:51 PM      Result Value Range Status   MRSA by PCR NEGATIVE  NEGATIVE Final   Comment:            The GeneXpert MRSA Assay (FDA     approved for NASAL specimens     only), is one component of a     comprehensive MRSA colonization     surveillance program. It is not     intended to diagnose MRSA     infection nor to guide or     monitor treatment for     MRSA infections.  BODY FLUID CULTURE     Status: None   Collection Time    04/27/13  3:20 PM      Result Value Range Status   Specimen Description     Final   Value: FLUID THORACENTESIS LEFT SIDE COLLECTED BY DOCTOR BOLES   Special Requests NONE   Final   Gram Stain     Final   Value: NO WBC SEEN     NO ORGANISMS SEEN   Culture PENDING   Incomplete   Report Status PENDING   Incomplete   Studies/Results: Dg Chest 1 View  04/27/2013   *RADIOLOGY REPORT*  Clinical Data: Post left thoracentesis  CHEST - 1 VIEW  Comparison: Expiratory PA chest radiograph compared to 04/25/2013  Findings: No pneumothorax following left thoracentesis. Decrease in left pleural effusion. Enlargement of cardiac silhouette with pulmonary vascular congestion. Diffuse bilateral infiltrates greatest in left lower lobe. Osseous demineralization. Vascular stent identified left infraclavicular.  IMPRESSION: No pneumothorax following left thoracentesis.   Original Report Authenticated By: Ulyses Southward, M.D.   US Thoracentesis Asp Pleural Space W/img Guide  04/27/2013   *RADIOLOGY REPORT*  CLINICAL DATA: Left pleural effusion, left lower lobe consolidation  ULTRASOUND GUIDED LEFT THORACENTESIS:  Technique: After explanation of procedure and risks, written informed consent for thoracentesis obtained. Time out protocol followed. Left pleural effusion localized by ultrasound. Site at posterior leftchest prepped and draped in usual sterile fashion. Skin and chest  wall anesthetized with 4.5 ml of 1% lidocaine. Standard 8-French thoracentesis catheter placed into left pleural space. 375 ml of serous fluid aspirated by syringe pump. Procedure tolerated well by patient without immediate complication.  IMPRESSION: Ultrasound-guided left thoracentesis with removal of 375 ml of pleural fluid. Fluid sent to laboratory for requested analysis.   Original  Report Authenticated By: Ulyses Southward, M.D.    Medications:  Prior to Admission:  Prescriptions prior to admission  Medication Sig Dispense Refill  . donepezil (ARICEPT) 10 MG tablet Take 10 mg by mouth at bedtime.      . memantine (NAMENDA) 10 MG tablet Take 10 mg by mouth 2 (two) times daily. For memory      . multivitamin (RENA-VIT) TABS tablet Take 1 tablet by mouth daily.      [name] torsemide (DEMADEX) 20 MG tablet Take 20 mg by mouth daily.      [name] lidocaine-prilocaine (EMLA) cream Apply 1 application topically as needed. To be applied to port site, then wrap with saran wrap prior to accessing port.       Scheduled: . epoetin alfa  10,000 Units Intravenous Q M,W,F-HD  . furosemide  40 mg Intravenous Q12H  . ipratropium  0.5 mg Nebulization Q4H WA  . levalbuterol  1.25 mg Nebulization Q4H WA  . piperacillin-tazobactam (ZOSYN)  IV  2.25 g Intravenous Q8H  . sodium chloride  3 mL Intravenous Q12H  . vancomycin  750 mg Intravenous Q M,W,F-HD   Continuous:  GUY:QIHKVQ chloride, sodium chloride, sodium chloride, feeding supplement (NEPRO CARB STEADY), heparin, heparin, heparin, lidocaine (PF), lidocaine-prilocaine, pentafluoroprop-tetrafluoroeth, sodium chloride  Assesment: She was admitted with community-acquired pneumonia which is thought to be related to aspiration. She is improving. She seems to have had some volume overloaded as well. She had a pleural effusion and had thoracentesis yesterday. All the results are not back yet. She has improved markedly and I think she probably needs another 24 hours of IV  antibiotics and then could be discharged tomorrow. Principal Problem:   Aspiration pneumonia Active Problems:   ESRD (end stage renal disease)   Senile dementia   CAP (community acquired pneumonia)    Plan: Probable discharge tomorrow    LOS: 3 days   Kelcie Currie L 04/28/2013, 8:54 AM

## 2013-04-28 NOTE — Progress Notes (Signed)
Subjective: Interval History: has no complaint of difficulty in breathing. Presently she denies any orthopnea. Patient doesn't have any nausea vomiting..  Objective: Vital signs in last 24 hours: Temp:  [97.5 F (36.4 C)-98.5 F (36.9 C)] 97.5 F (36.4 C) (06/10 0400) Pulse Rate:  [47-94] 86 (06/10 0600) Resp:  [16-26] 26 (06/10 0600) BP: (66-147)/(24-71) 128/61 mmHg (06/10 0600) SpO2:  [90 %-100 %] 98 % (06/10 0728) Weight:  [74 kg (163 lb 2.3 oz)-75.3 kg (166 lb 0.1 oz)] 74 kg (163 lb 2.3 oz) (06/10 0500) Weight change: 0 kg (0 lb)  Intake/Output from previous day: 06/09 0701 - 06/10 0700 In: 590 [P.O.:440; IV Piggyback:150] Out: -577  Intake/Output this shift:    General appearance: alert, cooperative and no distress Resp: diminished breath sounds posterior - bilateral Cardio: regular rate and rhythm, S1, S2 normal, no murmur, click, rub or gallop GI: soft, non-tender; bowel sounds normal; no masses,  no organomegaly Extremities: extremities normal, atraumatic, no cyanosis or edema  Lab Results:  Recent Labs  04/27/13 0444 04/28/13 0522  WBC 6.8 7.1  HGB 9.3* 9.2*  HCT 28.4* 28.9*  PLT 103* 116*   BMET:  Recent Labs  04/27/13 0444 04/28/13 0522  NA 138 138  K 3.6 3.4*  CL 98 99  CO2 31 29  GLUCOSE 91 92  BUN 31* 20  CREATININE 5.15* 3.92*  CALCIUM 8.3* 8.5   No results found for this basename: PTH,  in the last 72 hours Iron Studies: No results found for this basename: IRON, TIBC, TRANSFERRIN, FERRITIN,  in the last 72 hours  Studies/Results: Dg Chest 1 View  04/27/2013   *RADIOLOGY REPORT*  Clinical Data: Post left thoracentesis  CHEST - 1 VIEW  Comparison: Expiratory PA chest radiograph compared to 04/25/2013  Findings: No pneumothorax following left thoracentesis. Decrease in left pleural effusion. Enlargement of cardiac silhouette with pulmonary vascular congestion. Diffuse bilateral infiltrates greatest in left lower lobe. Osseous demineralization.  Vascular stent identified left infraclavicular.  IMPRESSION: No pneumothorax following left thoracentesis.   Original Report Authenticated By: Ulyses Southward, M.D.   US Thoracentesis Asp Pleural Space W/img Guide  04/27/2013   *RADIOLOGY REPORT*  CLINICAL DATA: Left pleural effusion, left lower lobe consolidation  ULTRASOUND GUIDED LEFT THORACENTESIS:  Technique: After explanation of procedure and risks, written informed consent for thoracentesis obtained. Time out protocol followed. Left pleural effusion localized by ultrasound. Site at posterior leftchest prepped and draped in usual sterile fashion. Skin and chest wall anesthetized with 4.5 ml of 1% lidocaine. Standard 8-French thoracentesis catheter placed into left pleural space. 375 ml of serous fluid aspirated by syringe pump. Procedure tolerated well by patient without immediate complication.  IMPRESSION: Ultrasound-guided left thoracentesis with removal of 375 ml of pleural fluid. Fluid sent to laboratory for requested analysis.   Original Report Authenticated By: Ulyses Southward, M.D.    I have reviewed the patient's current medications.  Assessment/Plan: Problem #1 end-stage renal disease she status post hemodialysis yesterday BUN and creatinine was in acceptable range. Problem #2 difficulty in breathing. Presently patient seems to be more comfortable and is a symptomatic. She status post thoracentesis and also dialysis with for removal. Problem #3 anemia her hemoglobin and hematocrit is stable presently she is on Epogen. Problem #4 hypertension her blood pressure seems to be reasonably controlled Problem #5 pneumonia patient on antibiotics she is a febrile with normal white cell count. Problem #6 metabolic bone disease calcium and phosphorus is was in acceptable range. Problem #7 dementia Plan:  We'll make arrangements for patient to get dialysis tomorrow. We'll continue his other medications as before. We'll check her CBC and basic metabolic panel  in the morning.    LOS: 3 days   [name] S 04/28/2013,7:28 AM

## 2013-04-28 NOTE — Progress Notes (Signed)
MD informed patient is C-Diff positive, placed on contact isolation.

## 2013-04-28 NOTE — Evaluation (Signed)
Physical Therapy Evaluation Patient Details Name: [name] MRN: [id number] DOB: 05/11/1931 Today's Date: 04/28/2013 Time: 4098-1191 PT Time Calculation (min): 36 min  PT Assessment / Plan / Recommendation Clinical Impression  Pt was seen for initial eval.  She lives with grandson and needs assist with ADLs.  She receives HD 3x/week and normally ambulates independently in the home with no AD.  She is still requiring O2 (3L/min) as O2 sat on RA is 80% with no exertion.  She has a new dx. of C.Diff which has been causing a problem of diarrhea today.  She is now deconditioned and needs a walker for gait.  Because of multiple problems and deconditioning, I am recommending SNF at d/c.    PT Assessment  Patient needs continued PT services    Follow Up Recommendations  SNF    Does the patient have the potential to tolerate intense rehabilitation      Barriers to Discharge None      Equipment Recommendations  None recommended by PT    Recommendations for Other Services     Frequency Min 3X/week    Precautions / Restrictions Precautions Precautions: Fall Restrictions Weight Bearing Restrictions: No   Pertinent Vitals/Pain       Mobility  Bed Mobility Bed Mobility: Supine to Sit;Sit to Supine Supine to Sit: 4: Min guard;HOB flat Sit to Supine: 4: Min guard;HOB flat Transfers Transfers: Sit to Stand;Stand to Sit Sit to Stand: 3: Mod assist;With upper extremity assist;From bed Stand to Sit: 4: Min assist;With upper extremity assist;To bed Ambulation/Gait Ambulation/Gait Assistance: 4: Min guard Ambulation Distance (Feet): 40 Feet Assistive device: Rolling walker Ambulation/Gait Assistance Details: pt normally ambulates with no AD at home...now extremely unstable upon standing from sitting...needs a walker for stability Gait Pattern: Within Functional Limits;Trunk flexed Gait velocity: WNL Stairs: No Wheelchair Mobility Wheelchair Mobility: No    Exercises     PT  Diagnosis: Difficulty walking;Generalized weakness  PT Problem List: Decreased strength;Decreased activity tolerance;Decreased mobility;Decreased balance;Decreased safety awareness;Decreased knowledge of use of DME;Cardiopulmonary status limiting activity PT Treatment Interventions: Gait training;Stair training;Functional mobility training;Therapeutic exercise;Patient/family education   PT Goals Acute Rehab PT Goals PT Goal Formulation: With patient/family Time For Goal Achievement: 05/12/13 Potential to Achieve Goals: Good Pt will go Sit to Stand: with supervision;with upper extremity assist PT Goal: Sit to Stand - Progress: Goal set today Pt will go Stand to Sit: with supervision;with upper extremity assist PT Goal: Stand to Sit - Progress: Goal set today Pt will Ambulate: 16 - 50 feet;with supervision;with least restrictive assistive device PT Goal: Ambulate - Progress: Goal set today Pt will Go Up / Down Stairs: 1-2 stairs;with min assist;with rail(s) PT Goal: Up/Down Stairs - Progress: Goal set today  Visit Information  Last PT Received On: 04/28/13    Subjective Data  Subjective: i feel good now Patient Stated Goal: return home   Prior Functioning  Home Living Lives With: Family Available Help at Discharge: Family;Available 24 hours/day Type of Home: House Home Access: Stairs to enter [employer] of Steps: 2-3 Entrance Stairs-Rails: Right Home Layout: One level Bathroom Shower/Tub: Health visitor: Standard Home Adaptive Equipment: Walker - rolling;Straight cane;Bedside commode/3-in-1;Shower chair with back Prior Function Level of Independence: Needs assistance Needs Assistance: Bathing;Dressing;Meal Prep;Light Housekeeping Bath: Moderate Dressing: Moderate Meal Prep: Maximal Light Housekeeping: Maximal Able to Take Stairs?: Yes Driving: No Vocation: Retired Comments: gets HD 3x/week Communication Communication: No difficulties     Cognition  Cognition Arousal/Alertness: Awake/alert Behavior During Therapy:  WFL for tasks assessed/performed Overall Cognitive Status: History of cognitive impairments - at baseline    Extremity/Trunk Assessment Right Lower Extremity Assessment RLE ROM/Strength/Tone: WFL for tasks assessed RLE Sensation: WFL - Light Touch RLE Coordination: WFL - gross motor Left Lower Extremity Assessment LLE ROM/Strength/Tone: WFL for tasks assessed LLE Sensation: WFL - Light Touch LLE Coordination: WFL - gross motor Trunk Assessment Trunk Assessment: Kyphotic   Balance Balance Balance Assessed: Yes Static Sitting Balance Static Sitting - Balance Support: No upper extremity supported;Feet supported Static Sitting - Level of Assistance: 6: Modified independent (Device/Increase time) Static Standing Balance Static Standing - Balance Support: No upper extremity supported Static Standing - Level of Assistance: 3: Mod assist  End of Session PT - End of Session Equipment Utilized During Treatment: Gait belt;Oxygen Activity Tolerance: Patient limited by fatigue Patient left: in bed;with call bell/phone within reach;with bed alarm set;with family/visitor present Nurse Communication: Mobility status  GP     Konrad Penta 04/28/2013, 4:23 PM

## 2013-04-29 DIAGNOSIS — J9600 Acute respiratory failure, unspecified whether with hypoxia or hypercapnia: Secondary | ICD-10-CM | POA: Diagnosis present

## 2013-04-29 LAB — CBC
HCT: 29.2 % — ABNORMAL LOW (ref 36.0–46.0)
Hemoglobin: 9.4 g/dL — ABNORMAL LOW (ref 12.0–15.0)
MCH: 30.1 pg (ref 26.0–34.0)
MCHC: 32.2 g/dL (ref 30.0–36.0)

## 2013-04-29 LAB — BASIC METABOLIC PANEL
BUN: 29 mg/dL — ABNORMAL HIGH (ref 6–23)
GFR calc non Af Amer: 7 mL/min — ABNORMAL LOW (ref >90)
Glucose, Bld: 90 mg/dL (ref 70–99)
Potassium: 3.3 mEq/L — ABNORMAL LOW (ref 3.5–5.1)

## 2013-04-29 LAB — PHOSPHORUS: Phosphorus: 3 mg/dL (ref 2.3–4.6)

## 2013-04-29 MED ORDER — POTASSIUM CHLORIDE 20 MEQ/15ML (10%) PO LIQD
20.0000 meq | Freq: Once | ORAL | Status: AC
  Administered 2013-04-29: 20 meq via ORAL
  Filled 2013-04-29: qty 30

## 2013-04-29 MED ORDER — VANCOMYCIN 50 MG/ML ORAL SOLUTION
250.0000 mg | Freq: Three times a day (TID) | ORAL | Status: DC
  Administered 2013-04-29 – 2013-04-30 (×5): 250 mg via ORAL
  Filled 2013-04-29 (×10): qty 5

## 2013-04-29 NOTE — Clinical Social Work Note (Signed)
CSW presented bed offers to pt and pt's son and they choose [facility]. Facility notified. Awaiting insurance authorization.   [name], [place] [phone]

## 2013-04-29 NOTE — Progress Notes (Signed)
Subjective: She is awake and alert. She has no new complaints. She's going to have dialysis today. She is positive for Clostridium difficile but through miscommunication did not get started on vancomycin until this morning  Objective: Vital signs in last 24 hours: Temp:  [98.4 F (36.9 C)-98.6 F (37 C)] 98.6 F (37 C) (06/11 0500) Pulse Rate:  [82-93] 82 (06/11 0500) Resp:  [16-25] 16 (06/11 0500) BP: (119-147)/(53-68) 119/68 mmHg (06/11 0500) SpO2:  [90 %-99 %] 94 % (06/11 0829) Weight:  [77.1 kg (169 lb 15.6 oz)] 77.1 kg (169 lb 15.6 oz) (06/11 0500) Weight change: 1.8 kg (3 lb 15.5 oz) Last BM Date: 04/28/13  Intake/Output from previous day: 06/10 0701 - 06/11 0700 In: 770 [P.O.:720; IV Piggyback:50] Out: -   PHYSICAL EXAM General appearance: alert, cooperative and no distress Resp: rhonchi bilaterally Cardio: regular rate and rhythm, S1, S2 normal, no murmur, click, rub or gallop GI: soft, non-tender; bowel sounds normal; no masses,  no organomegaly Extremities: extremities normal, atraumatic, no cyanosis or edema  Lab Results:    Basic Metabolic Panel:  Recent Labs  95/62/13 0522 04/29/13 0625  NA 138 135  K 3.4* 3.3*  CL 99 96  CO2 29 29  GLUCOSE 92 90  BUN 20 29*  CREATININE 3.92* 5.53*  CALCIUM 8.5 8.5  PHOS 2.4 3.0   Liver Function Tests: No results found for this basename: AST, ALT, ALKPHOS, BILITOT, PROT, ALBUMIN,  in the last 72 hours No results found for this basename: LIPASE, AMYLASE,  in the last 72 hours No results found for this basename: AMMONIA,  in the last 72 hours CBC:  Recent Labs  04/28/13 0522 04/29/13 0625  WBC 7.1 7.3  HGB 9.2* 9.4*  HCT 28.9* 29.2*  MCV 93.5 93.6  PLT 116* 123*   Cardiac Enzymes: No results found for this basename: CKTOTAL, CKMB, CKMBINDEX, TROPONINI,  in the last 72 hours BNP: No results found for this basename: PROBNP,  in the last 72 hours D-Dimer: No results found for this basename: DDIMER,  in the  last 72 hours CBG: No results found for this basename: GLUCAP,  in the last 72 hours Hemoglobin A1C: No results found for this basename: HGBA1C,  in the last 72 hours Fasting Lipid Panel: No results found for this basename: CHOL, HDL, LDLCALC, TRIG, CHOLHDL, LDLDIRECT,  in the last 72 hours Thyroid Function Tests: No results found for this basename: TSH, T4TOTAL, FREET4, T3FREE, THYROIDAB,  in the last 72 hours Anemia Panel: No results found for this basename: VITAMINB12, FOLATE, FERRITIN, TIBC, IRON, RETICCTPCT,  in the last 72 hours Coagulation: No results found for this basename: LABPROT, INR,  in the last 72 hours Urine Drug Screen: Drugs of Abuse  No results found for this basename: labopia, cocainscrnur, labbenz, amphetmu, thcu, labbarb    Alcohol Level: No results found for this basename: ETH,  in the last 72 hours Urinalysis: No results found for this basename: COLORURINE, APPERANCEUR, LABSPEC, PHURINE, GLUCOSEU, HGBUR, BILIRUBINUR, KETONESUR, PROTEINUR, UROBILINOGEN, NITRITE, LEUKOCYTESUR,  in the last 72 hours Misc. Labs:  ABGS No results found for this basename: PHART, PCO2, PO2ART, TCO2, HCO3,  in the last 72 hours CULTURES Recent Results (from the past 240 hour(s))  CULTURE, BLOOD (ROUTINE X 2)     Status: None   Collection Time    04/25/13  4:45 PM      Result Value Range Status   Specimen Description BLOOD RIGHT ARM   Final   Special  Requests     Final   Value: BOTTLES DRAWN AEROBIC AND ANAEROBIC 6CC EACH BOTTLE   Culture NO GROWTH 3 DAYS   Final   Report Status PENDING   Incomplete  CULTURE, BLOOD (ROUTINE X 2)     Status: None   Collection Time    04/25/13  5:15 PM      Result Value Range Status   Specimen Description BLOOD RIGHT ARM   Final   Special Requests     Final   Value: BOTTLES DRAWN AEROBIC AND ANAEROBIC 6CC EACH BOTTLE   Culture NO GROWTH 3 DAYS   Final   Report Status PENDING   Incomplete  MRSA PCR SCREENING     Status: None   Collection Time     04/25/13  8:51 PM      Result Value Range Status   MRSA by PCR NEGATIVE  NEGATIVE Final   Comment:            The GeneXpert MRSA Assay (FDA     approved for NASAL specimens     only), is one component of a     comprehensive MRSA colonization     surveillance program. It is not     intended to diagnose MRSA     infection nor to guide or     monitor treatment for     MRSA infections.  AFB CULTURE WITH SMEAR     Status: None   Collection Time    04/27/13  3:20 PM      Result Value Range Status   Specimen Description     Final   Value: FLUID THORACENTESIS LEFT SIDE COLLECTED BY DOCTOR BOLES   Special Requests NONE   Final   ACID FAST SMEAR NO ACID FAST BACILLI SEEN   Final   Culture     Final   Value: CULTURE WILL BE EXAMINED FOR 6 WEEKS BEFORE ISSUING A FINAL REPORT   Report Status PENDING   Incomplete  BODY FLUID CULTURE     Status: None   Collection Time    04/27/13  3:20 PM      Result Value Range Status   Specimen Description     Final   Value: FLUID THORACENTESIS LEFT SIDE COLLECTED BY DOCTOR BOLES   Special Requests NONE   Final   Gram Stain     Final   Value: NO WBC SEEN     NO ORGANISMS SEEN   Culture NO GROWTH   Final   Report Status PENDING   Incomplete  CLOSTRIDIUM DIFFICILE BY PCR     Status: Abnormal   Collection Time    04/28/13 11:28 AM      Result Value Range Status   C difficile by pcr POSITIVE (*) NEGATIVE Final   Comment: CRITICAL RESULT CALLED TO, READ BACK BY AND VERIFIED WITH:     DICKERSON,M. AT 1651 ON 04/28/2013 BY BAUGHAM,M.   Studies/Results: Dg Chest 1 View  04/27/2013   *RADIOLOGY REPORT*  Clinical Data: Post left thoracentesis  CHEST - 1 VIEW  Comparison: Expiratory PA chest radiograph compared to 04/25/2013  Findings: No pneumothorax following left thoracentesis. Decrease in left pleural effusion. Enlargement of cardiac silhouette with pulmonary vascular congestion. Diffuse bilateral infiltrates greatest in left lower lobe. Osseous  demineralization. Vascular stent identified left infraclavicular.  IMPRESSION: No pneumothorax following left thoracentesis.   Original Report Authenticated By: Ulyses Southward, M.D.   US Thoracentesis Asp Pleural Space W/img Guide  04/27/2013   *RADIOLOGY  REPORT*  CLINICAL DATA: Left pleural effusion, left lower lobe consolidation  ULTRASOUND GUIDED LEFT THORACENTESIS:  Technique: After explanation of procedure and risks, written informed consent for thoracentesis obtained. Time out protocol followed. Left pleural effusion localized by ultrasound. Site at posterior leftchest prepped and draped in usual sterile fashion. Skin and chest wall anesthetized with 4.5 ml of 1% lidocaine. Standard 8-French thoracentesis catheter placed into left pleural space. 375 ml of serous fluid aspirated by syringe pump. Procedure tolerated well by patient without immediate complication.  IMPRESSION: Ultrasound-guided left thoracentesis with removal of 375 ml of pleural fluid. Fluid sent to laboratory for requested analysis.   Original Report Authenticated By: Ulyses Southward, M.D.    Medications:  Prior to Admission:  Prescriptions prior to admission  Medication Sig Dispense Refill  . donepezil (ARICEPT) 10 MG tablet Take 10 mg by mouth at bedtime.      . memantine (NAMENDA) 10 MG tablet Take 10 mg by mouth 2 (two) times daily. For memory      . multivitamin (RENA-VIT) TABS tablet Take 1 tablet by mouth daily.      [name] torsemide (DEMADEX) 20 MG tablet Take 20 mg by mouth daily.      [name] lidocaine-prilocaine (EMLA) cream Apply 1 application topically as needed. To be applied to port site, then wrap with saran wrap prior to accessing port.       Scheduled: . donepezil  10 mg Oral QHS  . epoetin alfa  10,000 Units Intravenous Q M,W,F-HD  . furosemide  40 mg Intravenous Q12H  . ipratropium  0.5 mg Nebulization Q4H WA  . levalbuterol  1.25 mg Nebulization Q4H WA  . memantine  10 mg Oral BID  . piperacillin-tazobactam (ZOSYN)  IV   2.25 g Intravenous Q8H  . sodium chloride  3 mL Intravenous Q12H  . vancomycin  250 mg Oral TID PC & HS  . vancomycin  750 mg Intravenous Q M,W,F-HD   Continuous:  WGN:FAOZHY chloride, sodium chloride, sodium chloride, sodium chloride, sodium chloride, diphenhydrAMINE, feeding supplement (NEPRO CARB STEADY), heparin, heparin, heparin, lidocaine (PF), lidocaine-prilocaine, pentafluoroprop-tetrafluoroeth, sodium chloride  Assesment: she has aspiration pneumonia. She had acute respiratory failure based on that. She she had a pleural effusion and had thoracentesis for that. She had C. difficile colitis and has started treatment todayhas end-stage renal disease on dialysis.  Principal Problem:   Aspiration pneumonia Active Problems:   ESRD (end stage renal disease)   Senile dementia   CAP (community acquired pneumonia)   Pleural effusion   Volume overload   Acute respiratory failure    Plan: She will start vancomycin. It has been recommended that she go to skilled care facility by physical therapy.    LOS: 4 days   Malacki Mcphearson L 04/29/2013, 8:49 AM

## 2013-04-29 NOTE — Clinical Social Work Psychosocial (Signed)
Clinical Social Work Department BRIEF PSYCHOSOCIAL ASSESSMENT 04/29/2013  Patient:  [name], [name]     Account Number:  [id number]     Admit date:  04/25/2013  Clinical Social Worker:  Nancie Neas  Date/Time:  04/29/2013 09:00 AM  Referred by:  Physician  Date Referred:  04/29/2013 Referred for  SNF Placement   Other Referral:   Interview type:  Patient Other interview type:   and son- Onalee Hua    PSYCHOSOCIAL DATA Living Status:  ALONE Admitted from facility:   Level of care:   Primary support name:  Onalee Hua Primary support relationship to patient:  CHILD, ADULT Degree of support available:   supportive    CURRENT CONCERNS Current Concerns  Post-Acute Placement   Other Concerns:    SOCIAL WORK ASSESSMENT / PLAN CSW met with pt at bedside. Pt alert and oriented and reports she is feeling better this morning. Pt states she lives at home alone and generally is independent with all ADLs and taking care of cooking and cleaning. Pt does not use assistive device at baseline. Her son, Onalee Hua is her best support and takes pt to doctor's appointments. Pt is on dialysis Monday, Wednesday, Friday at [facility] in [place]. PT evaluated pt yesterday and recommendation is for SNF. CSW discussed placement process and pt is agreeable, depending on how she is when she is d/c. Pt went to [facility] for about a month 4 years ago. Onalee Hua reports pt had little financial responsibility at that time. Aware there may be copays though. SNF list provided.   Assessment/plan status:  Psychosocial Support/Ongoing Assessment of Needs Other assessment/ plan:   Information/referral to community resources:   SNF list    PATIENT'S/FAMILY'S RESPONSE TO PLAN OF CARE: Pt and son are agreeable to initiate bed search. CSW will fax out FL2 and follow up with bed offers when available. Requesting [facility] if possible.        Derenda Fennel, [place] [phone]

## 2013-04-29 NOTE — Progress Notes (Signed)
[name]  MRN: [id number]  DOB/AGE: [birth year] 77 y.o.  Primary Care Physician:HAWKINS,EDWARD L, MD  Admit date: 04/25/2013  Chief Complaint:  Chief Complaint  Patient presents with  . Shortness of Breath    S-Pt presented on  04/25/2013 with  Chief Complaint  Patient presents with  . Shortness of Breath  .    Pt today feels better  meds . donepezil  10 mg Oral QHS  . epoetin alfa  10,000 Units Intravenous Q M,W,F-HD  . furosemide  40 mg Intravenous Q12H  . ipratropium  0.5 mg Nebulization Q4H WA  . levalbuterol  1.25 mg Nebulization Q4H WA  . memantine  10 mg Oral BID  . piperacillin-tazobactam (ZOSYN)  IV  2.25 g Intravenous Q8H  . sodium chloride  3 mL Intravenous Q12H  . vancomycin  750 mg Intravenous Q M,W,F-HD     Physical Exam: Vital signs in last 24 hours: Temp:  [98.4 F (36.9 C)-98.6 F (37 C)] 98.6 F (37 C) (06/11 0500) Pulse Rate:  [82-93] 82 (06/11 0500) Resp:  [16-25] 16 (06/11 0500) BP: (119-152)/(53-68) 119/68 mmHg (06/11 0500) SpO2:  [96 %-99 %] 96 % (06/11 0500) Weight:  [169 lb 15.6 oz (77.1 kg)] 169 lb 15.6 oz (77.1 kg) (06/11 0500) Weight change: 3 lb 15.5 oz (1.8 kg) Last BM Date: 04/28/13  Intake/Output from previous day: 06/10 0701 - 06/11 0700 In: 770 [P.O.:720; IV Piggyback:50] Out: -      Physical Exam: General- pt is awake,alert, oriented to time place and person Resp- No acute REsp distress, CTA B/L NO Rhonchi CVS- S1S2 regular ij rate and rhythm GIT- BS+, soft, NT, ND EXT- NO LE Edema, Cyanosis Access-   Lab Results: CBC  Recent Labs  04/28/13 0522 04/29/13 0625  WBC 7.1 7.3  HGB 9.2* 9.4*  HCT 28.9* 29.2*  PLT 116* 123*    BMET  Recent Labs  04/28/13 0522 04/29/13 0625  NA 138 135  K 3.4* 3.3*  CL 99 96  CO2 29 29  GLUCOSE 92 90  BUN 20 29*  CREATININE 3.92* 5.53*  CALCIUM 8.5 8.5    MICRO Recent Results (from the past 240 hour(s))  CULTURE, BLOOD (ROUTINE X 2)     Status: None   Collection  Time    04/25/13  4:45 PM      Result Value Range Status   Specimen Description BLOOD RIGHT ARM   Final   Special Requests     Final   Value: BOTTLES DRAWN AEROBIC AND ANAEROBIC 6CC EACH BOTTLE   Culture NO GROWTH 3 DAYS   Final   Report Status PENDING   Incomplete  CULTURE, BLOOD (ROUTINE X 2)     Status: None   Collection Time    04/25/13  5:15 PM      Result Value Range Status   Specimen Description BLOOD RIGHT ARM   Final   Special Requests     Final   Value: BOTTLES DRAWN AEROBIC AND ANAEROBIC 6CC EACH BOTTLE   Culture NO GROWTH 3 DAYS   Final   Report Status PENDING   Incomplete  MRSA PCR SCREENING     Status: None   Collection Time    04/25/13  8:51 PM      Result Value Range Status   MRSA by PCR NEGATIVE  NEGATIVE Final   Comment:            The GeneXpert MRSA Assay (FDA     approved  for NASAL specimens     only), is one component of a     comprehensive MRSA colonization     surveillance program. It is not     intended to diagnose MRSA     infection nor to guide or     monitor treatment for     MRSA infections.  AFB CULTURE WITH SMEAR     Status: None   Collection Time    04/27/13  3:20 PM      Result Value Range Status   Specimen Description     Final   Value: FLUID THORACENTESIS LEFT SIDE COLLECTED BY DOCTOR BOLES   Special Requests NONE   Final   ACID FAST SMEAR NO ACID FAST BACILLI SEEN   Final   Culture     Final   Value: CULTURE WILL BE EXAMINED FOR 6 WEEKS BEFORE ISSUING A FINAL REPORT   Report Status PENDING   Incomplete  BODY FLUID CULTURE     Status: None   Collection Time    04/27/13  3:20 PM      Result Value Range Status   Specimen Description     Final   Value: FLUID THORACENTESIS LEFT SIDE COLLECTED BY DOCTOR BOLES   Special Requests NONE   Final   Gram Stain     Final   Value: NO WBC SEEN     NO ORGANISMS SEEN   Culture NO GROWTH   Final   Report Status PENDING   Incomplete  CLOSTRIDIUM DIFFICILE BY PCR     Status: Abnormal   Collection  Time    04/28/13 11:28 AM      Result Value Range Status   C difficile by pcr POSITIVE (*) NEGATIVE Final   Comment: CRITICAL RESULT CALLED TO, READ BACK BY AND VERIFIED WITH:     DICKERSON,M. AT 1651 ON 04/28/2013 BY BAUGHAM,M.      Lab Results  Component Value Date   PTH 92.3* 04/02/2009   CALCIUM 8.5 04/29/2013   PHOS 3.0 04/29/2013           Impression: 1)Renal   ESRD on HD On MWF schedule    2)HTN bp at goal    3)Anemia HGb at goal (9--11) On epo with HD  4)CKD Mineral-Bone Disorder PTH over suppressed( but old lab) Secondary Hyperparathyroidism  present Phosphorus at goal.   5)ID -Pneumonia On ABX   6)FEN  Hypokalemic  NOrmonatremic   7)Acid base Co2 at goal     Plan:  Will dialyze today. Will replete K as hypokalemia     [name] S 04/29/2013, 7:47 AM

## 2013-04-29 NOTE — Clinical Social Work Placement (Signed)
Clinical Social Work Department CLINICAL SOCIAL WORK PLACEMENT NOTE 04/29/2013  Patient:  [name], [name]  Account Number:  [id number] Admit date:  04/25/2013  Clinical Social Worker:  Derenda Fennel, LCSW  Date/time:  04/29/2013 09:13 AM  Clinical Social Work is seeking post-discharge placement for this patient at the following level of care:   SKILLED NURSING   (*CSW will update this form in Epic as items are completed)   04/29/2013  Patient/family provided with [facility] Department of Clinical Social Work's list of facilities offering this level of care within the geographic area requested by the patient (or if unable, by the patient's family).  04/29/2013  Patient/family informed of their freedom to choose among providers that offer the needed level of care, that participate in Medicare, Medicaid or managed care program needed by the patient, have an available bed and are willing to accept the patient.  04/29/2013  Patient/family informed of [facility]' ownership interest in [facility], as well as of the fact that they are under no obligation to receive care at this facility.  PASARR submitted to EDS on  PASARR number received from EDS on   FL2 transmitted to all facilities in geographic area requested by pt/family on  04/29/2013 FL2 transmitted to all facilities within larger geographic area on   Patient informed that his/her managed care company has contracts with or will negotiate with  certain facilities, including the following:     Patient/family informed of bed offers received:  04/29/2013 Patient chooses bed at [facility] Physician recommends and patient chooses bed at  [facility]  Patient to be transferred to  on   Patient to be transferred to facility by   The following physician request were entered in Epic:   Additional Comments: Pt has existing pasarr number.  Derenda Fennel, [place] [phone]

## 2013-04-29 NOTE — Procedures (Signed)
   HEMODIALYSIS TREATMENT NOTE:  4 hour HD session completed through left upper arm AVF (15g ante/retrograde).  GOAL MET: Tolerated removal of 3.5 liters with no interruption in ultrafiltration.  All blood was reinfused.  Prolonged bleeding from venous needle site.  Hemostasis achieved within 30 minutes.  Report given to [facility], RN.

## 2013-04-29 NOTE — Clinical Social Work Placement (Signed)
Clinical Social Work Department CLINICAL SOCIAL WORK PLACEMENT NOTE 04/29/2013  Patient:  [name], [name]  Account Number:  [id number] Admit date:  04/25/2013  Clinical Social Worker:  Derenda Fennel, LCSW  Date/time:  04/29/2013 09:13 AM  Clinical Social Work is seeking post-discharge placement for this patient at the following level of care:   SKILLED NURSING   (*CSW will update this form in Epic as items are completed)   04/29/2013  Patient/family provided with [facility] Department of Clinical Social Work's list of facilities offering this level of care within the geographic area requested by the patient (or if unable, by the patient's family).  04/29/2013  Patient/family informed of their freedom to choose among providers that offer the needed level of care, that participate in Medicare, Medicaid or managed care program needed by the patient, have an available bed and are willing to accept the patient.  04/29/2013  Patient/family informed of [facility]' ownership interest in [facility], as well as of the fact that they are under no obligation to receive care at this facility.  PASARR submitted to EDS on  PASARR number received from EDS on   FL2 transmitted to all facilities in geographic area requested by pt/family on  04/29/2013 FL2 transmitted to all facilities within larger geographic area on   Patient informed that his/her managed care company has contracts with or will negotiate with  certain facilities, including the following:     Patient/family informed of bed offers received:   Patient chooses bed at  Physician recommends and patient chooses bed at    Patient to be transferred to  on   Patient to be transferred to facility by   The following physician request were entered in Epic:   Additional Comments: Pt has existing pasarr number.  Derenda Fennel, [place] [phone]

## 2013-04-30 ENCOUNTER — Inpatient Hospital Stay
Admission: RE | Admit: 2013-04-30 | Discharge: 2013-05-16 | Disposition: A | Discharge status: Home / Self Care | Payer: Medicare HMO | Source: Ambulatory Visit | Attending: Pulmonary Disease | Admitting: Pulmonary Disease

## 2013-04-30 DIAGNOSIS — A0472 Enterocolitis due to Clostridium difficile, not specified as recurrent: Secondary | ICD-10-CM | POA: Diagnosis not present

## 2013-04-30 LAB — CULTURE, BLOOD (ROUTINE X 2): Culture: NO GROWTH

## 2013-04-30 LAB — PATHOLOGIST SMEAR REVIEW

## 2013-04-30 LAB — GLUCOSE, CAPILLARY: Glucose-Capillary: 107 mg/dL — ABNORMAL HIGH (ref 70–99)

## 2013-04-30 MED ORDER — VANCOMYCIN HCL IN DEXTROSE 750-5 MG/150ML-% IV SOLN: 750.0000 mg | INTRAVENOUS | Status: DC

## 2013-04-30 MED ORDER — LEVALBUTEROL HCL 1.25 MG/0.5ML IN NEBU: 1.2500 mg | INHALATION_SOLUTION | Freq: Four times a day (QID) | RESPIRATORY_TRACT | Status: DC | PRN

## 2013-04-30 MED ORDER — PIPERACILLIN-TAZOBACTAM IN DEX 2-0.25 GM/50ML IV SOLN: 2.2500 g | Freq: Three times a day (TID) | INTRAVENOUS | Status: DC

## 2013-04-30 MED ORDER — VANCOMYCIN 50 MG/ML ORAL SOLUTION: 250.0000 mg | Freq: Three times a day (TID) | ORAL | Status: DC

## 2013-04-30 MED ORDER — IPRATROPIUM BROMIDE 0.02 % IN SOLN: 0.5000 mg | Freq: Four times a day (QID) | RESPIRATORY_TRACT | Status: DC | PRN

## 2013-04-30 NOTE — Progress Notes (Signed)
Subjective: She feels okay and is set for transfer to skilled care facility  Objective: Vital signs in last 24 hours: Temp:  [97.9 F (36.6 C)-98.6 F (37 C)] 97.9 F (36.6 C) (06/12 0543) Pulse Rate:  [81-105] 84 (06/12 0543) Resp:  [16-24] 20 (06/12 0543) BP: (102-150)/(51-72) 102/51 mmHg (06/12 0543) SpO2:  [92 %-100 %] 96 % (06/12 0649) Weight:  [72.4 kg (159 lb 9.8 oz)-74.8 kg (164 lb 14.5 oz)] 72.4 kg (159 lb 9.8 oz) (06/12 0543) Weight change: -2.3 kg (-5 lb 1.1 oz) Last BM Date: 04/29/13  Intake/Output from previous day: 06/11 0701 - 06/12 0700 In: -  Out: 3544   PHYSICAL EXAM General appearance: alert, cooperative and mild distress Resp: rhonchi bilaterally Cardio: regular rate and rhythm, S1, S2 normal, no murmur, click, rub or gallop GI: soft, non-tender; bowel sounds normal; no masses,  no organomegaly Extremities: extremities normal, atraumatic, no cyanosis or edema  Lab Results:    Basic Metabolic Panel:  Recent Labs  16/10/96 0522 04/29/13 0625  NA 138 135  K 3.4* 3.3*  CL 99 96  CO2 29 29  GLUCOSE 92 90  BUN 20 29*  CREATININE 3.92* 5.53*  CALCIUM 8.5 8.5  PHOS 2.4 3.0   Liver Function Tests: No results found for this basename: AST, ALT, ALKPHOS, BILITOT, PROT, ALBUMIN,  in the last 72 hours No results found for this basename: LIPASE, AMYLASE,  in the last 72 hours No results found for this basename: AMMONIA,  in the last 72 hours CBC:  Recent Labs  04/28/13 0522 04/29/13 0625  WBC 7.1 7.3  HGB 9.2* 9.4*  HCT 28.9* 29.2*  MCV 93.5 93.6  PLT 116* 123*   Cardiac Enzymes: No results found for this basename: CKTOTAL, CKMB, CKMBINDEX, TROPONINI,  in the last 72 hours BNP: No results found for this basename: PROBNP,  in the last 72 hours D-Dimer: No results found for this basename: DDIMER,  in the last 72 hours CBG:  Recent Labs  04/30/13 0006  GLUCAP 107*   Hemoglobin A1C: No results found for this basename: HGBA1C,  in the  last 72 hours Fasting Lipid Panel: No results found for this basename: CHOL, HDL, LDLCALC, TRIG, CHOLHDL, LDLDIRECT,  in the last 72 hours Thyroid Function Tests: No results found for this basename: TSH, T4TOTAL, FREET4, T3FREE, THYROIDAB,  in the last 72 hours Anemia Panel: No results found for this basename: VITAMINB12, FOLATE, FERRITIN, TIBC, IRON, RETICCTPCT,  in the last 72 hours Coagulation: No results found for this basename: LABPROT, INR,  in the last 72 hours Urine Drug Screen: Drugs of Abuse  No results found for this basename: labopia, cocainscrnur, labbenz, amphetmu, thcu, labbarb    Alcohol Level: No results found for this basename: ETH,  in the last 72 hours Urinalysis: No results found for this basename: COLORURINE, APPERANCEUR, LABSPEC, PHURINE, GLUCOSEU, HGBUR, BILIRUBINUR, KETONESUR, PROTEINUR, UROBILINOGEN, NITRITE, LEUKOCYTESUR,  in the last 72 hours Misc. Labs:  ABGS No results found for this basename: PHART, PCO2, PO2ART, TCO2, HCO3,  in the last 72 hours CULTURES Recent Results (from the past 240 hour(s))  CULTURE, BLOOD (ROUTINE X 2)     Status: None   Collection Time    04/25/13  4:45 PM      Result Value Range Status   Specimen Description BLOOD RIGHT ARM   Final   Special Requests     Final   Value: BOTTLES DRAWN AEROBIC AND ANAEROBIC 6CC EACH BOTTLE   Culture NO  GROWTH 4 DAYS   Final   Report Status PENDING   Incomplete  CULTURE, BLOOD (ROUTINE X 2)     Status: None   Collection Time    04/25/13  5:15 PM      Result Value Range Status   Specimen Description BLOOD RIGHT ARM   Final   Special Requests     Final   Value: BOTTLES DRAWN AEROBIC AND ANAEROBIC 6CC EACH BOTTLE   Culture NO GROWTH 4 DAYS   Final   Report Status PENDING   Incomplete  MRSA PCR SCREENING     Status: None   Collection Time    04/25/13  8:51 PM      Result Value Range Status   MRSA by PCR NEGATIVE  NEGATIVE Final   Comment:            The GeneXpert MRSA Assay (FDA      approved for NASAL specimens     only), is one component of a     comprehensive MRSA colonization     surveillance program. It is not     intended to diagnose MRSA     infection nor to guide or     monitor treatment for     MRSA infections.  AFB CULTURE WITH SMEAR     Status: None   Collection Time    04/27/13  3:20 PM      Result Value Range Status   Specimen Description     Final   Value: FLUID THORACENTESIS LEFT SIDE COLLECTED BY DOCTOR BOLES   Special Requests NONE   Final   ACID FAST SMEAR NO ACID FAST BACILLI SEEN   Final   Culture     Final   Value: CULTURE WILL BE EXAMINED FOR 6 WEEKS BEFORE ISSUING A FINAL REPORT   Report Status PENDING   Incomplete  BODY FLUID CULTURE     Status: None   Collection Time    04/27/13  3:20 PM      Result Value Range Status   Specimen Description     Final   Value: FLUID THORACENTESIS LEFT SIDE COLLECTED BY DOCTOR BOLES   Special Requests NONE   Final   Gram Stain     Final   Value: NO WBC SEEN     NO ORGANISMS SEEN   Culture NO GROWTH 1 DAY   Final   Report Status PENDING   Incomplete  CLOSTRIDIUM DIFFICILE BY PCR     Status: Abnormal   Collection Time    04/28/13 11:28 AM      Result Value Range Status   C difficile by pcr POSITIVE (*) NEGATIVE Final   Comment: CRITICAL RESULT CALLED TO, READ BACK BY AND VERIFIED WITH:     DICKERSON,M. AT 1651 ON 04/28/2013 BY BAUGHAM,M.   Studies/Results: No results found.  Medications:  Prior to Admission:  Prescriptions prior to admission  Medication Sig Dispense Refill  . donepezil (ARICEPT) 10 MG tablet Take 10 mg by mouth at bedtime.      . memantine (NAMENDA) 10 MG tablet Take 10 mg by mouth 2 (two) times daily. For memory      . multivitamin (RENA-VIT) TABS tablet Take 1 tablet by mouth daily.      [name] torsemide (DEMADEX) 20 MG tablet Take 20 mg by mouth daily.      [name] lidocaine-prilocaine (EMLA) cream Apply 1 application topically as needed. To be applied to port site, then wrap with  saran wrap  prior to accessing port.       Scheduled: . donepezil  10 mg Oral QHS  . epoetin alfa  10,000 Units Intravenous Q M,W,F-HD  . furosemide  40 mg Intravenous Q12H  . memantine  10 mg Oral BID  . piperacillin-tazobactam (ZOSYN)  IV  2.25 g Intravenous Q8H  . sodium chloride  3 mL Intravenous Q12H  . vancomycin  250 mg Oral TID PC & HS  . vancomycin  750 mg Intravenous Q M,W,F-HD   Continuous:  ZOX:WRUEAV chloride, sodium chloride, sodium chloride, sodium chloride, sodium chloride, diphenhydrAMINE, feeding supplement (NEPRO CARB STEADY), heparin, heparin, heparin, ipratropium, levalbuterol, lidocaine (PF), lidocaine-prilocaine, pentafluoroprop-tetrafluoroeth, sodium chloride  Assesment: She was admitted with aspiration pneumonia and acute respiratory failure. She's doing much better. She has end-stage renal disease. She is deconditioned. Principal Problem:   Aspiration pneumonia Active Problems:   ESRD (end stage renal disease)   Senile dementia   CAP (community acquired pneumonia)   Pleural effusion   Volume overload   Acute respiratory failure    Plan: She will be transferred to skilled care facility    LOS: 5 days   [name] L 04/30/2013, 8:51 AM

## 2013-04-30 NOTE — Progress Notes (Signed)
Patient discharged to [facility] report given to Quenten Raven LPN. No c/o pain or discomfort noted. Vital signs stable.Out of bed to chair this am without difficulty.Family at bedside.Transported to [facility] by staff.

## 2013-04-30 NOTE — Clinical Social Work Note (Signed)
Pt d/c today to [facility]. Pt, pt's son, and facility aware and agreeable. Pt to transfer with RN. D/C summary faxed. [employer] authorization received per [facility].  Derenda Fennel, [place] [phone]

## 2013-04-30 NOTE — Discharge Summary (Signed)
Physician Discharge Summary  Patient ID: [name] MRN: [id number] DOB/AGE: 1930-12-19 79 y.o. Primary Care Physician:Raynesha Tiedt L, MD Admit date: 04/25/2013 Discharge date: 04/30/2013    Discharge Diagnoses:   Principal Problem:   Aspiration pneumonia Active Problems:   ESRD (end stage renal disease)   Senile dementia   CAP (community acquired pneumonia)   Pleural effusion   Volume overload   Acute respiratory failure   Clostridium difficile colitis     Medication List    TAKE these medications       donepezil 10 MG tablet  Commonly known as:  ARICEPT  Take 10 mg by mouth at bedtime.     EMLA cream  Generic drug:  lidocaine-prilocaine  Apply 1 application topically as needed. To be applied to port site, then wrap with saran wrap prior to accessing port.     ipratropium 0.02 % nebulizer solution  Commonly known as:  ATROVENT  Take 2.5 mLs (0.5 mg total) by nebulization every 6 (six) hours as needed.     levalbuterol 1.25 MG/0.5ML nebulizer solution  Commonly known as:  XOPENEX  Take 1.25 mg by nebulization every 6 (six) hours as needed for wheezing or shortness of breath.     memantine 10 MG tablet  Commonly known as:  NAMENDA  Take 10 mg by mouth 2 (two) times daily. For memory     multivitamin Tabs tablet  Take 1 tablet by mouth daily.     piperacillin-tazobactam 2-0.25 GM/50ML IVPB  Commonly known as:  ZOSYN  Inject 50 mLs (2.25 g total) into the vein every 8 (eight) hours.     torsemide 20 MG tablet  Commonly known as:  DEMADEX  Take 20 mg by mouth daily.     vancomycin 50 mg/mL oral solution  Commonly known as:  VANCOCIN  Take 5 mLs (250 mg total) by mouth 4 (four) times daily - after meals and at bedtime.     Vancomycin 750 MG/150ML Soln  Commonly known as:  VANCOCIN  Inject 150 mLs (750 mg total) into the vein every Monday, Wednesday, and Friday with hemodialysis.        Discharged Condition: Improved    Consults:  Nephrology  Significant Diagnostic Studies: Dg Chest 1 View  04/27/2013   *RADIOLOGY REPORT*  Clinical Data: Post left thoracentesis  CHEST - 1 VIEW  Comparison: Expiratory PA chest radiograph compared to 04/25/2013  Findings: No pneumothorax following left thoracentesis. Decrease in left pleural effusion. Enlargement of cardiac silhouette with pulmonary vascular congestion. Diffuse bilateral infiltrates greatest in left lower lobe. Osseous demineralization. Vascular stent identified left infraclavicular.  IMPRESSION: No pneumothorax following left thoracentesis.   Original Report Authenticated By: Ulyses Southward, M.D.   Dg Chest Port 1 View  04/25/2013   *RADIOLOGY REPORT*  Clinical Data: 79 year old female with pneumonia and effusion. Cough.  PORTABLE CHEST - 1 VIEW  Comparison: 04/25/2013 and earlier.  Findings: Portable semi upright AP view at 1947 hours.  The patient is not slightly more rotated to the left.  Stable lung volumes. Stable cardiomegaly and mediastinal contours.  Left subclavian/axillary vein stent graft again noted.  No significant interval change in dense retrocardiac opacity felt to represent a combination of consolidation and effusion.  Patchy right mid and lower lung opacity also not significantly changed.  IMPRESSION: No significant interval change.  Dense retrocardiac consolidation, left pleural effusion, and developing infection in the right lung.   Original Report Authenticated By: Erskine Speed, M.D.   Dg Chest [facility]  1 View  04/25/2013   *RADIOLOGY REPORT*  Clinical Data: Cough for the past 4 days.  Suspected pneumonia.  PORTABLE CHEST - 1 VIEW  Comparison: Chest x-ray 11/18/2012.  Findings: Dense opacity throughout the left mid and lower hemithorax concerning for extensive airspace consolidation in the left lower lobe, likely with a left parapneumonic pleural effusion. Diffuse interstitial prominence throughout the right lung.  Heart size appears enlarged, but is largely obscured. The  patient is rotated to the left on today's exam, resulting in distortion of the mediastinal contours and reduced diagnostic sensitivity and specificity for mediastinal pathology.  Atherosclerosis of the thoracic aorta. A vascular stent is noted in the left subclavian and axillary region.  IMPRESSION: 1.  Opacity throughout the left mid and lower hemithorax concerning for left lower lobe airspace consolidation, likely from pneumonia, with moderate to large left parapneumonic pleural effusion. 2.  Diffuse interstitial prominence throughout the right lung may represent early endobronchial spread of infection. 3.  Probable cardiomegaly. 4.  Atherosclerosis.   Original Report Authenticated By: Trudie Reed, M.D.   US Thoracentesis Asp Pleural Space W/img Guide  04/27/2013   *RADIOLOGY REPORT*  CLINICAL DATA: Left pleural effusion, left lower lobe consolidation  ULTRASOUND GUIDED LEFT THORACENTESIS:  Technique: After explanation of procedure and risks, written informed consent for thoracentesis obtained. Time out protocol followed. Left pleural effusion localized by ultrasound. Site at posterior leftchest prepped and draped in usual sterile fashion. Skin and chest wall anesthetized with 4.5 ml of 1% lidocaine. Standard 8-French thoracentesis catheter placed into left pleural space. 375 ml of serous fluid aspirated by syringe pump. Procedure tolerated well by patient without immediate complication.  IMPRESSION: Ultrasound-guided left thoracentesis with removal of 375 ml of pleural fluid. Fluid sent to laboratory for requested analysis.   Original Report Authenticated By: Ulyses Southward, M.D.    Lab Results: Basic Metabolic Panel:  Recent Labs  16/10/96 0522 04/29/13 0625  NA 138 135  K 3.4* 3.3*  CL 99 96  CO2 29 29  GLUCOSE 92 90  BUN 20 29*  CREATININE 3.92* 5.53*  CALCIUM 8.5 8.5  PHOS 2.4 3.0   Liver Function Tests: No results found for this basename: AST, ALT, ALKPHOS, BILITOT, PROT, ALBUMIN,  in  the last 72 hours   CBC:  Recent Labs  04/28/13 0522 04/29/13 0625  WBC 7.1 7.3  HGB 9.2* 9.4*  HCT 28.9* 29.2*  MCV 93.5 93.6  PLT 116* 123*    Recent Results (from the past 240 hour(s))  CULTURE, BLOOD (ROUTINE X 2)     Status: None   Collection Time    04/25/13  4:45 PM      Result Value Range Status   Specimen Description BLOOD RIGHT ARM   Final   Special Requests     Final   Value: BOTTLES DRAWN AEROBIC AND ANAEROBIC 6CC EACH BOTTLE   Culture NO GROWTH 4 DAYS   Final   Report Status PENDING   Incomplete  CULTURE, BLOOD (ROUTINE X 2)     Status: None   Collection Time    04/25/13  5:15 PM      Result Value Range Status   Specimen Description BLOOD RIGHT ARM   Final   Special Requests     Final   Value: BOTTLES DRAWN AEROBIC AND ANAEROBIC 6CC EACH BOTTLE   Culture NO GROWTH 4 DAYS   Final   Report Status PENDING   Incomplete  MRSA PCR SCREENING     Status: None  Collection Time    04/25/13  8:51 PM      Result Value Range Status   MRSA by PCR NEGATIVE  NEGATIVE Final   Comment:            The GeneXpert MRSA Assay (FDA     approved for NASAL specimens     only), is one component of a     comprehensive MRSA colonization     surveillance program. It is not     intended to diagnose MRSA     infection nor to guide or     monitor treatment for     MRSA infections.  AFB CULTURE WITH SMEAR     Status: None   Collection Time    04/27/13  3:20 PM      Result Value Range Status   Specimen Description     Final   Value: FLUID THORACENTESIS LEFT SIDE COLLECTED BY DOCTOR BOLES   Special Requests NONE   Final   ACID FAST SMEAR NO ACID FAST BACILLI SEEN   Final   Culture     Final   Value: CULTURE WILL BE EXAMINED FOR 6 WEEKS BEFORE ISSUING A FINAL REPORT   Report Status PENDING   Incomplete  BODY FLUID CULTURE     Status: None   Collection Time    04/27/13  3:20 PM      Result Value Range Status   Specimen Description     Final   Value: FLUID THORACENTESIS LEFT  SIDE COLLECTED BY DOCTOR BOLES   Special Requests NONE   Final   Gram Stain     Final   Value: NO WBC SEEN     NO ORGANISMS SEEN   Culture NO GROWTH 1 DAY   Final   Report Status PENDING   Incomplete  CLOSTRIDIUM DIFFICILE BY PCR     Status: Abnormal   Collection Time    04/28/13 11:28 AM      Result Value Range Status   C difficile by pcr POSITIVE (*) NEGATIVE Final   Comment: CRITICAL RESULT CALLED TO, READ BACK BY AND VERIFIED WITH:     DICKERSON,M. AT 1651 ON 04/28/2013 BY BAUGHAM,M.     Hospital Course: She was admitted with what appeared to be in acute respiratory failure related to aspiration pneumonia. She also had some element of volume overload. She has chronic renal failure on dialysis. She has dementia. She was started on IV antibiotics and improved. She had a pleural effusion and this was tapped without infection. She improved over the next several days but developed diarrhea which was positive for Clostridium difficile. She had PT consultation was felt to be too weak to go directly home and is going to go to skilled care facility for rehabilitation and to finish IV antibiotic  Discharge Exam: Blood pressure 102/51, pulse 84, temperature 97.9 F (36.6 C), temperature source Oral, resp. rate 20, height 5\' 3"  (1.6 m), weight 72.4 kg (159 lb 9.8 oz), SpO2 96.00%. She is awake and alert. Her chest is with rhonchi bilaterally. Her heart is regular.  Disposition: To skilled care facility. She will need 5 days at the skilled care facility of IV antibiotics as above. She will need 7 days of oral vancomycin after she finishes the IV antibiotics. She will be on a renal diet and will need PT OT and speech as needed.      Discharge Orders   Future Orders Complete By Expires     Discharge to  SNF when bed available  As directed          Signed: Mckinleigh Schuchart L Pager [phone]  04/30/2013, 8:56 AM

## 2013-04-30 NOTE — Clinical Social Work Placement (Signed)
Clinical Social Work Department CLINICAL SOCIAL WORK PLACEMENT NOTE 04/30/2013  Patient:  [name], [name]  Account Number:  [id number] Admit date:  04/25/2013  Clinical Social Worker:  Derenda Fennel, LCSW  Date/time:  04/29/2013 09:13 AM  Clinical Social Work is seeking post-discharge placement for this patient at the following level of care:   SKILLED NURSING   (*CSW will update this form in Epic as items are completed)   04/29/2013  Patient/family provided with [facility] Department of Clinical Social Work's list of facilities offering this level of care within the geographic area requested by the patient (or if unable, by the patient's family).  04/29/2013  Patient/family informed of their freedom to choose among providers that offer the needed level of care, that participate in Medicare, Medicaid or managed care program needed by the patient, have an available bed and are willing to accept the patient.  04/29/2013  Patient/family informed of [facility]' ownership interest in [facility], as well as of the fact that they are under no obligation to receive care at this facility.  PASARR submitted to [employer] on  PASARR number received from [employer] on   FL2 transmitted to all facilities in geographic area requested by pt/family on  04/29/2013 FL2 transmitted to all facilities within larger geographic area on   Patient informed that his/her managed care company has contracts with or will negotiate with  certain facilities, including the following:     Patient/family informed of bed offers received:  04/29/2013 Patient chooses bed at [facility] Physician recommends and patient chooses bed at  [facility]  Patient to be transferred to [facility] on  04/30/2013 Patient to be transferred to facility by RN  The following physician request were entered in Epic:   Additional Comments: Pt has existing pasarr number.   Derenda Fennel, [place] [phone]

## 2013-04-30 NOTE — Progress Notes (Signed)
Subjective: Interval History: has no complaint of difficulty in breathing. Presently she denies any orthopnea. Patient doesn't have any nausea vomiting.. her cough has improved. And presently she denies any pain. Objective: Vital signs in last 24 hours: Temp:  [97.9 F (36.6 C)-98.6 F (37 C)] 97.9 F (36.6 C) (06/12 0543) Pulse Rate:  [81-105] 84 (06/12 0543) Resp:  [16-24] 20 (06/12 0543) BP: (102-150)/(51-72) 102/51 mmHg (06/12 0543) SpO2:  [90 %-100 %] 96 % (06/12 0649) Weight:  [72.4 kg (159 lb 9.8 oz)-74.8 kg (164 lb 14.5 oz)] 72.4 kg (159 lb 9.8 oz) (06/12 0543) Weight change: -2.3 kg (-5 lb 1.1 oz)  Intake/Output from previous day: 06/11 0701 - 06/12 0700 In: -  Out: 3544  Intake/Output this shift:    General appearance: alert, cooperative and no distress Resp: diminished breath sounds posterior - bilateral Cardio: regular rate and rhythm, S1, S2 normal, no murmur, click, rub or gallop GI: soft, non-tender; bowel sounds normal; no masses,  no organomegaly Extremities: extremities normal, atraumatic, no cyanosis or edema  Lab Results:  Recent Labs  04/28/13 0522 04/29/13 0625  WBC 7.1 7.3  HGB 9.2* 9.4*  HCT 28.9* 29.2*  PLT 116* 123*   BMET:   Recent Labs  04/28/13 0522 04/29/13 0625  NA 138 135  K 3.4* 3.3*  CL 99 96  CO2 29 29  GLUCOSE 92 90  BUN 20 29*  CREATININE 3.92* 5.53*  CALCIUM 8.5 8.5   No results found for this basename: PTH,  in the last 72 hours Iron Studies: No results found for this basename: IRON, TIBC, TRANSFERRIN, FERRITIN,  in the last 72 hours  Studies/Results: No results found.  I have reviewed the patient's current medications.  Assessment/Plan: Problem #1 end-stage renal disease she status post hemodialysis yesterday BUN and creatinine is with  in acceptable range. Problem #2 difficulty in breathing. Presently patient seems to be more comfortable and is  asymptomatic. She status post thoracentesis and also dialysis with  fluid removal. Problem #3 anemia her hemoglobin and hematocrit is stable presently she is on Epogen. Problem #4 hypertension her blood pressure seems to be reasonably controlled Problem #5 pneumonia patient on antibiotics she is a febrile with normal white cell count. Problem #6 metabolic bone disease calcium and phosphorus is was in acceptable range. Problem #7 dementia Problem #8 hypokalemia potassium seems to be somewhat low. Most likely related to her poor by mouth intake. Plan: We'll make arrangements for patient to get dialysis tomorrow. We'll continue his other medications as before. We'll check her CBC and basic metabolic panel in the morning. We'll give patient KCl 20 mEq by mouth. We'll use 4K bath in the morning.    LOS: 5 days   Tasnia Spegal S 04/30/2013,7:26 AM

## 2013-05-01 LAB — BODY FLUID CULTURE: Culture: NO GROWTH

## 2013-05-10 NOTE — H&P (Signed)
NAMECAMRY, [name]                 ACCOUNT NO.:  [id number]  MEDICAL RECORD NO.:  [id number]  LOCATION:  S150                          FACILITY:  [facility]  PHYSICIAN:  Davona Kinoshita L. Juanetta Gosling, M.D.DATE OF BIRTH:  08/19/1931  DATE OF ADMISSION:  04/30/2013 DATE OF DISCHARGE:  LH                             HISTORY & PHYSICAL   The patient at the [facility].  REASON FOR ADMISSION:  Pneumonia, physical deconditioning and difficulty walking.  HISTORY OF PRESENT ILLNESS:  This is an 79 year old who had been in pretty good health except for chronic renal disease on dialysis and dementia.  She had been in the hospital starting on June 7 with pneumonia.  She was treated with antibiotics and improved, but when she was evaluated for physical therapy, it was noted that she had a lot of difficulty in ambulating, so she was recommended to have admission to a skilled care facility for rehabilitation.  PAST MEDICAL HISTORY:  Positive for chronic renal failure, on dialysis. She has had a cholecystectomy.  She has mild dementia.  Really, no other chronic medical problems.  SOCIAL HISTORY:  She is a nonsmoker.  She lives at home with family nearby.  She has been independent.  FAMILY HISTORY:  Essentially negative.  PHYSICAL EXAMINATION:  GENERAL:  She is awake and alert. HEENT:  Pupils are reactive.  Nose and throat are clear.  Mucous membranes are moist. NECK:  Supple without masses. CHEST:  Clear. HEART:  Regular without gallop. ABDOMEN:  Soft.  No masses are felt. EXTREMITIES:  Showed no clubbing, cyanosis, or edema.  ASSESSMENT:  She has poor ambulatory ability, physical deconditioning. She had pneumonia and is to continue with IV antibiotics.  She has chronic renal failure, on dialysis.  She is to be treated until she is well enough to ambulate at home and can manage independently.     Kimberlynn Lumbra L. Juanetta Gosling, M.D.     ELH/MEDQ  D:  05/10/2013  T:  05/10/2013  Job:  [id number]

## 2013-05-19 DIAGNOSIS — Z8701 Personal history of pneumonia (recurrent): Secondary | ICD-10-CM

## 2013-05-19 HISTORY — DX: Personal history of pneumonia (recurrent): Z87.01

## 2013-06-02 ENCOUNTER — Encounter (HOSPITAL_COMMUNITY): Payer: Self-pay

## 2013-06-02 ENCOUNTER — Inpatient Hospital Stay (HOSPITAL_COMMUNITY)
Admission: EM | Admit: 2013-06-02 | Discharge: 2013-06-05 | DRG: 252 | Disposition: A | Discharge status: Home / Self Care | Payer: Medicare HMO | Attending: Internal Medicine | Admitting: Internal Medicine

## 2013-06-02 DIAGNOSIS — J690 Pneumonitis due to inhalation of food and vomit: Secondary | ICD-10-CM

## 2013-06-02 DIAGNOSIS — A0472 Enterocolitis due to Clostridium difficile, not specified as recurrent: Secondary | ICD-10-CM

## 2013-06-02 DIAGNOSIS — D696 Thrombocytopenia, unspecified: Secondary | ICD-10-CM | POA: Diagnosis present

## 2013-06-02 DIAGNOSIS — Z79899 Other long term (current) drug therapy: Secondary | ICD-10-CM

## 2013-06-02 DIAGNOSIS — F0390 Unspecified dementia without behavioral disturbance: Secondary | ICD-10-CM

## 2013-06-02 DIAGNOSIS — J90 Pleural effusion, not elsewhere classified: Secondary | ICD-10-CM

## 2013-06-02 DIAGNOSIS — D631 Anemia in chronic kidney disease: Secondary | ICD-10-CM | POA: Diagnosis present

## 2013-06-02 DIAGNOSIS — I120 Hypertensive chronic kidney disease with stage 5 chronic kidney disease or end stage renal disease: Secondary | ICD-10-CM | POA: Diagnosis present

## 2013-06-02 DIAGNOSIS — J9600 Acute respiratory failure, unspecified whether with hypoxia or hypercapnia: Secondary | ICD-10-CM

## 2013-06-02 DIAGNOSIS — Z992 Dependence on renal dialysis: Secondary | ICD-10-CM

## 2013-06-02 DIAGNOSIS — T68XXXA Hypothermia, initial encounter: Secondary | ICD-10-CM

## 2013-06-02 DIAGNOSIS — N19 Unspecified kidney failure: Secondary | ICD-10-CM

## 2013-06-02 DIAGNOSIS — D62 Acute posthemorrhagic anemia: Secondary | ICD-10-CM

## 2013-06-02 DIAGNOSIS — N039 Chronic nephritic syndrome with unspecified morphologic changes: Secondary | ICD-10-CM | POA: Diagnosis present

## 2013-06-02 DIAGNOSIS — N186 End stage renal disease: Secondary | ICD-10-CM | POA: Diagnosis present

## 2013-06-02 DIAGNOSIS — J189 Pneumonia, unspecified organism: Secondary | ICD-10-CM

## 2013-06-02 DIAGNOSIS — M899 Disorder of bone, unspecified: Secondary | ICD-10-CM | POA: Diagnosis present

## 2013-06-02 DIAGNOSIS — Y832 Surgical operation with anastomosis, bypass or graft as the cause of abnormal reaction of the patient, or of later complication, without mention of misadventure at the time of the procedure: Secondary | ICD-10-CM | POA: Diagnosis present

## 2013-06-02 DIAGNOSIS — T82898A Other specified complication of vascular prosthetic devices, implants and grafts, initial encounter: Principal | ICD-10-CM | POA: Diagnosis present

## 2013-06-02 DIAGNOSIS — E8770 Fluid overload, unspecified: Secondary | ICD-10-CM

## 2013-06-02 DIAGNOSIS — N2581 Secondary hyperparathyroidism of renal origin: Secondary | ICD-10-CM | POA: Diagnosis present

## 2013-06-02 LAB — CBC WITH DIFFERENTIAL/PLATELET
Basophils Absolute: 0 10*3/uL (ref 0.0–0.1)
Basophils Relative: 0 % (ref 0–1)
Lymphocytes Relative: 12 % (ref 12–46)
Neutro Abs: 6.7 10*3/uL (ref 1.7–7.7)
Neutrophils Relative %: 81 % — ABNORMAL HIGH (ref 43–77)
Platelets: 141 10*3/uL — ABNORMAL LOW (ref 150–400)
RDW: 16.7 % — ABNORMAL HIGH (ref 11.5–15.5)
WBC: 8.2 10*3/uL (ref 4.0–10.5)

## 2013-06-02 LAB — BASIC METABOLIC PANEL
Calcium: 7.8 mg/dL — ABNORMAL LOW (ref 8.4–10.5)
GFR calc non Af Amer: 6 mL/min — ABNORMAL LOW (ref >90)
Glucose, Bld: 125 mg/dL — ABNORMAL HIGH (ref 70–99)
Potassium: 3.5 mEq/L (ref 3.5–5.1)
Sodium: 138 mEq/L (ref 135–145)

## 2013-06-02 LAB — GLUCOSE, CAPILLARY: Glucose-Capillary: 107 mg/dL — ABNORMAL HIGH (ref 70–99)

## 2013-06-02 MED ORDER — CEFTAZIDIME SODIUM IN D5W 1-5 GM/50ML-% IV SOLN: 1.0000 g | Freq: Once | INTRAVENOUS | Status: DC

## 2013-06-02 MED ORDER — MEMANTINE HCL 10 MG PO TABS
10.0000 mg | ORAL_TABLET | Freq: Two times a day (BID) | ORAL | Status: DC
  Administered 2013-06-02 – 2013-06-05 (×6): 10 mg via ORAL
  Filled 2013-06-02 (×7): qty 1

## 2013-06-02 MED ORDER — ONDANSETRON HCL 4 MG/2ML IJ SOLN: 4.0000 mg | Freq: Four times a day (QID) | INTRAMUSCULAR | Status: DC | PRN

## 2013-06-02 MED ORDER — RENA-VITE PO TABS
1.0000 | ORAL_TABLET | Freq: Every day | ORAL | Status: DC
  Administered 2013-06-02 – 2013-06-04 (×3): 1 via ORAL
  Filled 2013-06-02 (×7): qty 1

## 2013-06-02 MED ORDER — VANCOMYCIN HCL 1000 MG IV SOLR: 1000.0000 mg | Freq: Once | INTRAVENOUS | Status: DC

## 2013-06-02 MED ORDER — ONDANSETRON HCL 4 MG PO TABS: 4.0000 mg | ORAL_TABLET | Freq: Four times a day (QID) | ORAL | Status: DC | PRN

## 2013-06-02 MED ORDER — DONEPEZIL HCL 5 MG PO TABS
10.0000 mg | ORAL_TABLET | Freq: Every day | ORAL | Status: DC
  Administered 2013-06-02 – 2013-06-04 (×3): 10 mg via ORAL
  Filled 2013-06-02 (×4): qty 2

## 2013-06-02 MED ORDER — SODIUM CHLORIDE 0.9 % IJ SOLN
3.0000 mL | Freq: Two times a day (BID) | INTRAMUSCULAR | Status: DC
  Administered 2013-06-02: 3 mL via INTRAVENOUS
  Administered 2013-06-03: 22:00:00 via INTRAVENOUS
  Administered 2013-06-03 – 2013-06-05 (×4): 3 mL via INTRAVENOUS

## 2013-06-02 NOTE — ED Notes (Signed)
Critical labs called, hbg 6.4, dr.wickline notified.

## 2013-06-02 NOTE — ED Notes (Signed)
Per EMS, pt had dialysis yesterday. Pt was found by family member to have dialysis shunt had been bleeding through the night. Pt does not know when it started bleeding but family states it bleed through the mattress. No bleeding noted on arrival but swelling noted at site.

## 2013-06-02 NOTE — ED Provider Notes (Signed)
History  This chart was scribed for Joya Gaskins, MD by Bennett Scrape, ED Scribe. This patient was seen in room APA19/APA19 and the patient's care was started at 9:26 AM.  CSN: [id number] Arrival date & time 06/02/13  1478  First MD Initiated Contact with Patient 06/02/13 [phone]     Chief Complaint  Patient presents with  . Bleeding/Bruising    The history is provided by the patient and a relative (daughter). No language interpreter was used.    HPI Comments: [name] is a 79 y.o. female brought in by ambulance, who presents to the Emergency Department complaining of now resolved bleeding from her dialysis graft overnight. Grandson lives with pt and noticed blood on the arm and her covers this morning prompting him to call EMS. Pt denies CP, abdominal pain and SOB as associated symptoms and the bleeding from the graft is controlled currently. Daughter states that she had dialysis yesterday with no complications and reports that the graft has been in place for the past 4 years with no prior complications. The graft was placed at [facility], but she does not follow up with them. She gets dialysis at the [facility] and is due for dialysis tomorrow. Pt is not currently on any anticoagulants. Son states that the pt had stents placed in her left arm and shoulder last month to improve left arm swelling after she was diagnosed with a left carotid blockage. She was evaluated at [facility] twice but does not follow up regularly. She had a negative US of the left arm there and was sent to [facility] by the [facility]. She was diagnosed with the blockage at [facility] and family reports that the left arm appears to be improving, but she does not regularly follow up with them. However, son states that the pt has an appointment in 2 days that might be with the [facility].   PCP is Dr. Juanetta Gosling Nephrologist is Dr. Debby Bud  Past Medical History   Diagnosis Date  . Renal disorder    Past Surgical History  Procedure Laterality Date  . Dialysis fistula creation    . Cholecystectomy     No family history on file. History  Substance Use Topics  . Smoking status: Never Smoker   . Smokeless tobacco: Not on file  . Alcohol Use: No   No OB history provided.   Review of Systems  Cardiovascular: Negative for chest pain.  Gastrointestinal: Negative for abdominal pain.  Skin: Positive for wound.  Neurological: Negative for weakness.  All other systems reviewed and are negative.    Allergies  Sulfur  Home Medications   Current Outpatient Rx  Name  Route  Sig  Dispense  Refill  . donepezil (ARICEPT) 10 MG tablet   Oral   Take 10 mg by mouth at bedtime.         [name] ipratropium (ATROVENT) 0.02 % nebulizer solution   Nebulization   Take 2.5 mLs (0.5 mg total) by nebulization every 6 (six) hours as needed.   75 mL   12   . levalbuterol (XOPENEX) 1.25 MG/0.5ML nebulizer solution   Nebulization   Take 1.25 mg by nebulization every 6 (six) hours as needed for wheezing or shortness of breath.   1 each   12   . lidocaine-prilocaine (EMLA) cream   Topical   Apply 1 application topically as needed. To be applied to port site, then wrap with saran wrap  prior to accessing port.         . memantine (NAMENDA) 10 MG tablet   Oral   Take 10 mg by mouth 2 (two) times daily. For memory         . multivitamin (RENA-VIT) TABS tablet   Oral   Take 1 tablet by mouth daily.         . piperacillin-tazobactam (ZOSYN) 2-0.25 GM/50ML IVPB   Intravenous   Inject 50 mLs (2.25 g total) into the vein every 8 (eight) hours.   50 mL      . torsemide (DEMADEX) 20 MG tablet   Oral   Take 20 mg by mouth daily.         . vancomycin (VANCOCIN) 50 mg/mL oral solution   Oral   Take 5 mLs (250 mg total) by mouth 4 (four) times daily - after meals and at bedtime.         . Vancomycin (VANCOCIN) 750 MG/150ML SOLN   Intravenous    Inject 150 mLs (750 mg total) into the vein every Monday, Wednesday, and Friday with hemodialysis.          Triage Vitals: BP 109/42  Pulse 67  Resp 17  Ht 5\' 4"  (1.626 m)  Wt 159 lb (72.122 kg)  BMI 27.28 kg/m2  SpO2 100%  Physical Exam  Nursing note and vitals reviewed.  CONSTITUTIONAL: Well developed/well nourished HEAD: Normocephalic/atraumatic EYES: EOMI/PERRL ENMT: Mucous membranes moist NECK: supple no meningeal signs SPINE:entire spine nontender CV: S1/S2 noted, no murmurs/rubs/gallops noted LUNGS: Lungs are clear to auscultation bilaterally, no apparent distress ABDOMEN: soft, nontender, no rebound or guarding NEURO: Pt is awake/alert, moves all extremitiesx4 EXTREMITIES: pulses normal, full ROM, dialysis access to left arm, thrill noted, no active bleeding, no erythema SKIN: warm, color normal PSYCH: no abnormalities of mood noted  ED Course  Procedures   DIAGNOSTIC STUDIES: Oxygen Saturation is 100% on room air, normal by my interpretation.    COORDINATION OF CARE: 9:34 AM-Discussed treatment plan which includes CBC panel and BMP with pt and family at bedside and both agreed to plan.  11:27 AM Pt more awake/alert at this time Acute anemia noted, likely from bleeding graft last night, now bleeding is controlled D/w hospitalist to admit for anemia and hypothermia Pt has no objections to receiving blood products  MDM  Nursing notes including past medical history and social history reviewed and considered in documentation Labs/vital reviewed and considered   I personally performed the services described in this documentation, which was scribed in my presence. The recorded information has been reviewed and is accurate.      Joya Gaskins, MD 06/02/13 1128

## 2013-06-02 NOTE — H&P (Signed)
[facility] History and Physical  SALONI LABLANC WUJ:[id number] DOB: [birth year] DOA: 06/02/2013  Referring physician: Dr. Bebe Shaggy, ER. PCP: Fredirick Maudlin, MD  Specialists: Nephrology, Dr Fausto Skillern.  Chief Complaint: Bleeding from dialysis graft site.  HPI: [name] is a 79 y.o. female who has end-stage renal disease and is on hemodialysis. Unfortunately last night she was found to have active bleeding and this morning was found in a pool of blood, the bleeding was from her graft site. When she arrived to the emergency room, the bleeding spontaneously stopped. She is known to Dr Fausto Skillern, nephrology. She is now being admitted because of significant anemia from her bleeding. The patient herself has really not got any complaints. She does have dementia.   Review of Systems: Apart from history of present illness, other systems negative.  Past Medical History  Diagnosis Date  . Renal disorder    Past Surgical History  Procedure Laterality Date  . Dialysis fistula creation    . Cholecystectomy     Social History:  reports that she has never smoked. She does not have any smokeless tobacco history on file. She reports that she does not drink alcohol or use illicit drugs.   Allergies  Allergen Reactions  . Sulfa Antibiotics Other (See Comments)    unknown  . Sulfur     No family history on file. noncontributory.  Prior to Admission medications   Medication Sig Start Date End Date Taking? Authorizing Provider  ceftAZIDime (FORTAZ IN D5W) 1-5 GM/50ML-% Inject 1 g into the vein once.   Yes Historical Provider, MD  donepezil (ARICEPT) 10 MG tablet Take 10 mg by mouth at bedtime.   Yes Historical Provider, MD  memantine (NAMENDA) 10 MG tablet Take 10 mg by mouth 2 (two) times daily. For memory   Yes Historical Provider, MD  multivitamin (RENA-VIT) TABS tablet Take 1 tablet by mouth daily.   Yes Historical Provider, MD  sodium chloride 0.9 % SOLN 250 mL with vancomycin 1000 MG  SOLR 1,000 mg Inject 1,000 mg into the vein once.   Yes Historical Provider, MD  torsemide (DEMADEX) 20 MG tablet Take 20 mg by mouth daily.   Yes Historical Provider, MD   Physical Exam: Filed Vitals:   06/02/13 0926 06/02/13 0939 06/02/13 1127  BP: 109/42  96/46  Pulse: 67  68  Temp:  94.1 F (34.5 C) 95.3 F (35.2 C)  TempSrc:  Rectal Rectal  Resp: 17  18  Height: 5\' 4"  (1.626 m)    Weight: 72.122 kg (159 lb)    SpO2: 100%  98%     General:  She looks very pale.  Eyes: Pale. No jaundice.  ENT: No major abnormalities.  Neck: No lymphadenopathy.  Cardiovascular: Heart sounds present without murmurs or added sounds. She is clinically not in heart failure. Her graft site is quite distended/: With no active bleeding.  Respiratory: Lung fields are clear.  Abdomen: Soft, nontender.  Skin: No rash.  Musculoskeletal: No acute joint problems.  Psychiatric: Not examined.  Neurologic: Alert. No obvious focal neurological signs.  Labs on Admission:  Basic Metabolic Panel:  Recent Labs Lab 06/02/13 0942  NA 138  K 3.5  CL 99  CO2 26  GLUCOSE 125*  BUN 49*  CREATININE 5.67*  CALCIUM 7.8*       CBC:  Recent Labs Lab 06/02/13 0942  WBC 8.2  NEUTROABS 6.7  HGB 6.4*  HCT 19.5*  MCV 92.4  PLT 141*  CBG:  Recent Labs Lab 06/02/13 0941  GLUCAP 107*    Radiological Exams on Admission: No results found.    Assessment/Plan Active Problems:   Acute blood loss anemia   ESRD (end stage renal disease)   Senile dementia   1. Acute blood loss anemia from graft site. 2. End-stage renal disease on hemodialysis. 3. Moderate dementia.  Plan: 1. Admit to step down unit in view of significant anemia. 2. Given 3 units of blood transfusion. 3. Nephrology consultation. I suspect that she requires attention to the graft site with modification or even create other graft site. I suspect we will need vascular surgery involved. I will leave this to Dr  Fausto Skillern, nephrology, to decide.   Code Status: Full code per  wishes of her son, who is health care power of attorney.  Family Communication: Discussed plan with the patient's son at the bedside.   Disposition Plan: Depending on progress.   Time spent: 45 minutes.  Wilson Singer [facility] Pager [phone].  If 7PM-7AM, please contact night-coverage [email or web address] Password [facility] 06/02/2013, 11:42 AM

## 2013-06-02 NOTE — ED Notes (Signed)
Pt resting in bed, resp even. Offered nourishment to family and pt. Declined offer.

## 2013-06-02 NOTE — ED Notes (Signed)
Pt CBG is 107.

## 2013-06-02 NOTE — ED Notes (Signed)
Pt arrived from home by ems, was found covered in blood this am by caregiver. She had bled from dialysis shunt during the night, per family "she was covered in blood.", pt alert and able to answer all ?'s denies any complaints. Temp is 94.1 rectally and pt is on warming blanket at present. Family members at bedside.

## 2013-06-02 NOTE — ED Notes (Signed)
Pt blood pressure is 105/25 in right arm, rechecked blood pressure in right arm it is 109/42.

## 2013-06-03 DIAGNOSIS — N186 End stage renal disease: Secondary | ICD-10-CM

## 2013-06-03 LAB — COMPREHENSIVE METABOLIC PANEL
BUN: 56 mg/dL — ABNORMAL HIGH (ref 6–23)
Calcium: 7.6 mg/dL — ABNORMAL LOW (ref 8.4–10.5)
GFR calc Af Amer: 6 mL/min — ABNORMAL LOW (ref >90)
Glucose, Bld: 77 mg/dL (ref 70–99)
Total Protein: 5.1 g/dL — ABNORMAL LOW (ref 6.0–8.3)

## 2013-06-03 LAB — TYPE AND SCREEN
ABO/RH(D): A POS
Unit division: 0

## 2013-06-03 LAB — CBC
HCT: 25.8 % — ABNORMAL LOW (ref 36.0–46.0)
Hemoglobin: 8.8 g/dL — ABNORMAL LOW (ref 12.0–15.0)
Hemoglobin: 8.7 g/dL — ABNORMAL LOW (ref 12.0–15.0)
MCH: 29.9 pg (ref 26.0–34.0)
MCHC: 35.1 g/dL (ref 30.0–36.0)
MCV: 85.2 fL (ref 78.0–100.0)
Platelets: 126 10*3/uL — ABNORMAL LOW (ref 150–400)
RBC: 2.94 MIL/uL — ABNORMAL LOW (ref 3.87–5.11)
RBC: 2.91 MIL/uL — ABNORMAL LOW (ref 3.87–5.11)
WBC: 6.3 10*3/uL (ref 4.0–10.5)

## 2013-06-03 LAB — GLUCOSE, CAPILLARY: Glucose-Capillary: 101 mg/dL — ABNORMAL HIGH (ref 70–99)

## 2013-06-03 LAB — HEPATITIS B SURFACE ANTIGEN: Hepatitis B Surface Ag: NEGATIVE

## 2013-06-03 MED ORDER — ZOLPIDEM TARTRATE 5 MG PO TABS
5.0000 mg | ORAL_TABLET | Freq: Every evening | ORAL | Status: DC | PRN
  Administered 2013-06-03: 5 mg via ORAL
  Filled 2013-06-03: qty 1

## 2013-06-03 MED ORDER — ONDANSETRON HCL 4 MG/2ML IJ SOLN: 4.0000 mg | Freq: Four times a day (QID) | INTRAMUSCULAR | Status: DC | PRN

## 2013-06-03 MED ORDER — CALCIUM CARBONATE 1250 MG/5ML PO SUSP
500.0000 mg | Freq: Four times a day (QID) | ORAL | Status: DC | PRN
  Filled 2013-06-03: qty 5

## 2013-06-03 MED ORDER — DOCUSATE SODIUM 283 MG RE ENEM
1.0000 | ENEMA | RECTAL | Status: DC | PRN
  Filled 2013-06-03: qty 1

## 2013-06-03 MED ORDER — NEPRO/CARBSTEADY PO LIQD
237.0000 mL | Freq: Three times a day (TID) | ORAL | Status: DC | PRN
  Filled 2013-06-03: qty 237

## 2013-06-03 MED ORDER — FERROUS SULFATE 325 (65 FE) MG PO TABS
325.0000 mg | ORAL_TABLET | Freq: Two times a day (BID) | ORAL | Status: DC
  Administered 2013-06-03 – 2013-06-05 (×4): 325 mg via ORAL
  Filled 2013-06-03 (×7): qty 1

## 2013-06-03 MED ORDER — DEXTROSE 5 % IV SOLN
1.5000 g | INTRAVENOUS | Status: AC
  Administered 2013-06-04: 1.5 g via INTRAVENOUS
  Filled 2013-06-03: qty 1.5

## 2013-06-03 MED ORDER — SORBITOL 70 % SOLN
30.0000 mL | Status: DC | PRN
  Filled 2013-06-03: qty 30

## 2013-06-03 MED ORDER — HYDROXYZINE HCL 25 MG PO TABS
25.0000 mg | ORAL_TABLET | Freq: Three times a day (TID) | ORAL | Status: DC | PRN
  Filled 2013-06-03: qty 1

## 2013-06-03 MED ORDER — CAMPHOR-MENTHOL 0.5-0.5 % EX LOTN
1.0000 "application " | TOPICAL_LOTION | Freq: Three times a day (TID) | CUTANEOUS | Status: DC | PRN
  Filled 2013-06-03: qty 222

## 2013-06-03 MED ORDER — ONDANSETRON HCL 4 MG PO TABS: 4.0000 mg | ORAL_TABLET | Freq: Four times a day (QID) | ORAL | Status: DC | PRN

## 2013-06-03 MED ORDER — ACETAMINOPHEN 325 MG PO TABS: 650.0000 mg | ORAL_TABLET | Freq: Four times a day (QID) | ORAL | Status: DC | PRN

## 2013-06-03 MED ORDER — ACETAMINOPHEN 650 MG RE SUPP: 650.0000 mg | Freq: Four times a day (QID) | RECTAL | Status: DC | PRN

## 2013-06-03 NOTE — Progress Notes (Signed)
Dr Adah Perl patient-ESRD on HD-last Monday-presented with acute blood loss anemia for bleeding at the graft site-Transfused 2 units and admitted to the ICU. Seen by Renal at [facility]-and thought to have a? Bleeding aneurysm at the graft sit. Per Dr  Aleda Grana now for Step Down, he will place orders for a HD cath to be placed by IR here at [facility]-please call [facility] and Renal when the patient arrives. Accepted to SDU.

## 2013-06-03 NOTE — Progress Notes (Signed)
UR chart review completed.  

## 2013-06-03 NOTE — Progress Notes (Addendum)
PHOTO HAS BEEN TAKEN OF LT FISTULA SITE AND PLACED IN CHART. TRANSFER REPORT HAS BEEN CALLED TO [facility] ON 2600. [facility] NOTIFIED OF BED ASSIGNMENT. PT AND FAMILY AWAITING  TRANSFER.

## 2013-06-03 NOTE — Progress Notes (Signed)
INITIAL NUTRITION ASSESSMENT  DOCUMENTATION CODES Per approved criteria  -Not Applicable   INTERVENTION: Pt being transferred to [facility] for HD cath placement. Will follow if pt returns.  NUTRITION DIAGNOSIS: Altered nutrition related labs (BUN, Cr.) related to kidney dysfunction as evidenced by   BUN 56, Cr. 6.62.  Goal: Pt to meet >/= 90% of their estimated nutrition needs  Monitor:  Po intake, labs, I/O's and wt trends  Reason for Assessment: Malnutrition Screen Score = 2  79 y.o. female  Admitting Dx: Severe anemia  ASSESSMENT: Pt from home. Presented to ED with active bleeding from graft site. She has received 3 units PRBC's. Pt eating lunch during visit 75% consumed. Weight is stable. Lucila Maine is present and says they're waiting to be transferred to [facility].   Height: Ht Readings from Last 1 Encounters:  06/02/13 5\' 4"  (1.626 m)    Weight: Wt Readings from Last 1 Encounters:  06/02/13 159 lb (72.122 kg)    Ideal Body Weight: 120# (54.5 kg)  % Ideal Body Weight: 133%  Wt Readings from Last 10 Encounters:  06/02/13 159 lb (72.122 kg)  04/30/13 159 lb 9.8 oz (72.4 kg)  11/18/12 154 lb (69.854 kg)    Usual Body Weight: 155-160#  % Usual Body Weight: 103%  BMI:  Body mass index is 27.28 kg/(m^2).Overweight  Estimated Nutritional Needs: Kcal: 1500-1800 Protein: 80-90 gr  Fluid: 1000 ml plus urine output  Skin: No issues noted  Diet Order: Renal 80/90gr 2-2/1200ml fluid restricition  EDUCATION NEEDS: -No education needs identified at this time   Intake/Output Summary (Last 24 hours) at 06/03/13 1059 Last data filed at 06/03/13 0018  Gross per 24 hour  Intake 1437.5 ml  Output      0 ml  Net 1437.5 ml    Last BM: 06/03/13  Labs:   Recent Labs Lab 06/02/13 0942 06/03/13 0447  NA 138 139  K 3.5 3.7  CL 99 102  CO2 26 27  BUN 49* 56*  CREATININE 5.67* 6.62*  CALCIUM 7.8* 7.6*  GLUCOSE 125* 77    CBG (last 3)   Recent Labs  06/02/13 0941  GLUCAP 107*    Scheduled Meds: . donepezil  10 mg Oral QHS  . ferrous sulfate  325 mg Oral BID WC  . memantine  10 mg Oral BID  . multivitamin  1 tablet Oral QHS  . sodium chloride  3 mL Intravenous Q12H    Continuous Infusions:   Past Medical History  Diagnosis Date  . Renal disorder     Past Surgical History  Procedure Laterality Date  . Dialysis fistula creation    . Cholecystectomy      Royann Shivers MS,RD,LDN,CSG Office: #[phone] Pager: [phone]

## 2013-06-03 NOTE — Progress Notes (Signed)
Subjective: She was admitted  with bleeding from her graft site for hemodialysis. She was significantly anemic. She says she feels well today. She received 3 units of packed red blood cells.  Objective: Vital signs in last 24 hours: Temp:  [94.1 F (34.5 C)-98.4 F (36.9 C)] 97.7 F (36.5 C) (07/16 0220) Pulse Rate:  [65-83] 77 (07/16 0600) Resp:  [11-26] 14 (07/16 0600) BP: (81-122)/(28-50) 113/47 mmHg (07/16 0600) SpO2:  [95 %-100 %] 100 % (07/16 0600) Weight:  [72.122 kg (159 lb)] 72.122 kg (159 lb) (07/15 0926) Weight change:  Last BM Date: 06/01/13  Intake/Output from previous day: 07/15 0701 - 07/16 0700 In: 1437.5 [I.V.:750; Blood:687.5] Out: -   PHYSICAL EXAM General appearance: alert, cooperative, no distress and Mildly confused Resp: clear to auscultation bilaterally Cardio: regular rate and rhythm, S1, S2 normal, no murmur, click, rub or gallop GI: soft, non-tender; bowel sounds normal; no masses,  no organomegaly Extremities: extremities normal, atraumatic, no cyanosis or edema  Lab Results:    Basic Metabolic Panel:  Recent Labs  08/65/78 0942 06/03/13 0447  NA 138 139  K 3.5 3.7  CL 99 102  CO2 26 27  GLUCOSE 125* 77  BUN 49* 56*  CREATININE 5.67* 6.62*  CALCIUM 7.8* 7.6*   Liver Function Tests:  Recent Labs  06/03/13 0447  AST 17  ALT 10  ALKPHOS 53  BILITOT 0.5  PROT 5.1*  ALBUMIN 2.2*   No results found for this basename: LIPASE, AMYLASE,  in the last 72 hours No results found for this basename: AMMONIA,  in the last 72 hours CBC:  Recent Labs  06/02/13 0942 06/03/13 0447  WBC 8.2 6.3  NEUTROABS 6.7  --   HGB 6.4* 8.8*  HCT 19.5* 25.8*  MCV 92.4 87.8  PLT 141* 127*   Cardiac Enzymes: No results found for this basename: CKTOTAL, CKMB, CKMBINDEX, TROPONINI,  in the last 72 hours BNP: No results found for this basename: PROBNP,  in the last 72 hours D-Dimer: No results found for this basename: DDIMER,  in the last 72  hours CBG:  Recent Labs  06/02/13 0941  GLUCAP 107*   Hemoglobin A1C: No results found for this basename: HGBA1C,  in the last 72 hours Fasting Lipid Panel: No results found for this basename: CHOL, HDL, LDLCALC, TRIG, CHOLHDL, LDLDIRECT,  in the last 72 hours Thyroid Function Tests: No results found for this basename: TSH, T4TOTAL, FREET4, T3FREE, THYROIDAB,  in the last 72 hours Anemia Panel: No results found for this basename: VITAMINB12, FOLATE, FERRITIN, TIBC, IRON, RETICCTPCT,  in the last 72 hours Coagulation: No results found for this basename: LABPROT, INR,  in the last 72 hours Urine Drug Screen: Drugs of Abuse  No results found for this basename: labopia, cocainscrnur, labbenz, amphetmu, thcu, labbarb    Alcohol Level: No results found for this basename: ETH,  in the last 72 hours Urinalysis: No results found for this basename: COLORURINE, APPERANCEUR, LABSPEC, PHURINE, GLUCOSEU, HGBUR, BILIRUBINUR, KETONESUR, PROTEINUR, UROBILINOGEN, NITRITE, LEUKOCYTESUR,  in the last 72 hours Misc. Labs:  ABGS No results found for this basename: PHART, PCO2, PO2ART, TCO2, HCO3,  in the last 72 hours CULTURES Recent Results (from the past 240 hour(s))  MRSA PCR SCREENING     Status: None   Collection Time    06/02/13  1:15 PM      Result Value Range Status   MRSA by PCR NEGATIVE  NEGATIVE Final   Comment:  The GeneXpert MRSA Assay (FDA     approved for NASAL specimens     only), is one component of a     comprehensive MRSA colonization     surveillance program. It is not     intended to diagnose MRSA     infection nor to guide or     monitor treatment for     MRSA infections.   Studies/Results: No results found.  Medications:  Prior to Admission:  Prescriptions prior to admission  Medication Sig Dispense Refill  . ceftAZIDime (FORTAZ IN D5W) 1-5 GM/50ML-% Inject 1 g into the vein once.      . donepezil (ARICEPT) 10 MG tablet Take 10 mg by mouth at  bedtime.      . memantine (NAMENDA) 10 MG tablet Take 10 mg by mouth 2 (two) times daily. For memory      . multivitamin (RENA-VIT) TABS tablet Take 1 tablet by mouth daily.      . sodium chloride 0.9 % SOLN 250 mL with vancomycin 1000 MG SOLR 1,000 mg Inject 1,000 mg into the vein once.      . torsemide (DEMADEX) 20 MG tablet Take 20 mg by mouth daily.       Scheduled: . donepezil  10 mg Oral QHS  . memantine  10 mg Oral BID  . multivitamin  1 tablet Oral QHS  . sodium chloride  3 mL Intravenous Q12H   Continuous:  YNW:GNFAOZHYQMV (ZOFRAN) IV, ondansetron  Assesment: She has acute blood loss anemia. She has received 3 units of packed red blood cells. She has end-stage renal disease on dialysis. She has Alzheimer's type dementia which is stable. Active Problems:   ESRD (end stage renal disease)   Senile dementia   Acute blood loss anemia    Plan: Nephrology consultation. Continue other treatments. Add iron    LOS: 1 day   Howard Patton L 06/03/2013, 7:49 AM

## 2013-06-03 NOTE — Progress Notes (Signed)
PT HAS LEFT VIA CARE LINK TO GO TO [facility]. PT ALERT. AND AGITATED, BUT MOSTLY COOPERATIVE. NO FURTHER BLEEDING FROM LT UPPER ARM FISTULA SITE.

## 2013-06-03 NOTE — Progress Notes (Signed)
I have seen and assessed patient. Patient was admitted to the hospital on 06/02/2013 secondary to bleeding from graft site. She also noted to be anemic with a hemoglobin of 6.4 on admission. Patient is status post 3 units packed red blood cells. Patient due to bleeding from graft site and increased risk of bleeding a HD catheter was not placed at [facility] and patient was subsequently transferred to [facility] for further evaluation. Patient did not undergo hemodialysis today. Patient without complaints. Patient denies any further bleeding from graft site.  Patient currently hemodynamically stable without any complaints. Will check a CBC now and in the morning. Will consult with vascular surgery for further evaluation and management and possible graft repair and placement of HD cath for dialysis. Will also consult with nephrology.

## 2013-06-03 NOTE — Consult Note (Signed)
REN GRASSE MRN: [id number] DOB/AGE: 1931-10-28 79 y.o. Primary Care Physician:HAWKINS,EDWARD L, MD Admit date: 06/02/2013 Chief Complaint:  Chief Complaint  Patient presents with  . Bleeding/Bruising   HPI:  Pt is 79 year female with past medical history of ESRD who was brought to ER with chief complaint of Bleeding. HPI dates back to 7/15 when pt family member noticed that she has been bleeding through her access nd she was brought to ER. In ER pt was noticed to have severe anemia with hgb of 6.4 and pt was admitted in ICU. Pt later received 3 units of PRBC and her Hgb is now better at 8.8 Pt herself does not offer any complaints  NO c/o chest pain  No c/o dyspnea.  No c/o fever/ cough/ chills No c/o Abdominal pain    Past Medical History  Diagnosis Date  . Renal disorder       Family hx  NO hx of ESRD/ Kidney disease     Social History:  reports that she has never smoked. She does not have any smokeless tobacco history on file. She reports that she does not drink alcohol or use illicit drugs.   Allergies:  Allergies  Allergen Reactions  . Sulfa Antibiotics Other (See Comments)    unknown  . Sulfur     Medications Prior to Admission  Medication Sig Dispense Refill  . ceftAZIDime (FORTAZ IN D5W) 1-5 GM/50ML-% Inject 1 g into the vein once.      . donepezil (ARICEPT) 10 MG tablet Take 10 mg by mouth at bedtime.      . memantine (NAMENDA) 10 MG tablet Take 10 mg by mouth 2 (two) times daily. For memory      . multivitamin (RENA-VIT) TABS tablet Take 1 tablet by mouth daily.      . sodium chloride 0.9 % SOLN 250 mL with vancomycin 1000 MG SOLR 1,000 mg Inject 1,000 mg into the vein once.      . torsemide (DEMADEX) 20 MG tablet Take 20 mg by mouth daily.           ROS:Pt answers NO to all the questions.  . donepezil  10 mg Oral QHS  . ferrous sulfate  325 mg Oral BID WC  . memantine  10 mg Oral BID  . multivitamin  1 tablet Oral QHS  . sodium chloride  3 mL  Intravenous Q12H       Physical Exam: Vital signs in last 24 hours: Temp:  [94.1 F (34.5 C)-98.4 F (36.9 C)] 97.7 F (36.5 C) (07/16 0220) Pulse Rate:  [65-83] 77 (07/16 0600) Resp:  [11-26] 14 (07/16 0600) BP: (81-122)/(28-50) 113/47 mmHg (07/16 0600) SpO2:  [95 %-100 %] 100 % (07/16 0600) Weight:  [159 lb (72.122 kg)] 159 lb (72.122 kg) (07/15 0926) Weight change:  Last BM Date: 06/01/13  Intake/Output from previous day: 07/15 0701 - 07/16 0700 In: 1437.5 [I.V.:750; Blood:687.5] Out: -      Physical Exam: General- pt is awake,follows commands  Resp- No acute REsp distress, CTA B/L NO Rhonchi CVS- S1S2 regular in rate and rhythm GIT- BS+, soft, NT, ND EXT- NO LE Edema, Cyanosis CNS- CN 2-12 grossly intact. Moving all 4 extremities Psych- normal mod and affect Access-  AVF- Hematoma + Bruit + thrill +, Swelling +   Lab Results: CBC  Recent Labs  06/02/13 0942 06/03/13 0447  WBC 8.2 6.3  HGB 6.4* 8.8*  HCT 19.5* 25.8*  PLT 141* 127*  BMET  Recent Labs  06/02/13 0942 06/03/13 0447  NA 138 139  K 3.5 3.7  CL 99 102  CO2 26 27  GLUCOSE 125* 77  BUN 49* 56*  CREATININE 5.67* 6.62*  CALCIUM 7.8* 7.6*    MICRO Recent Results (from the past 240 hour(s))  MRSA PCR SCREENING     Status: None   Collection Time    06/02/13  1:15 PM      Result Value Range Status   MRSA by PCR NEGATIVE  NEGATIVE Final   Comment:            The GeneXpert MRSA Assay (FDA     approved for NASAL specimens     only), is one component of a     comprehensive MRSA colonization     surveillance program. It is not     intended to diagnose MRSA     infection nor to guide or     monitor treatment for     MRSA infections.      Lab Results  Component Value Date   PTH 92.3* 04/02/2009   CALCIUM 7.6* 06/03/2013   PHOS 3.0 04/29/2013      Impression: 1)Renal   ESRD on HD On MWF schedule NO acute need that Pt is to be dialyzed today and has issue with access. NO  hyperkalemia NO acidosis NO fluid overload   2)HTN BP at goal    3)Anemia secondary to Bleed + Chronic disease. Received PRBC  4)CKD Mineral-Bone Disorder PTH  over suppressed Secondary Hyperparathyroidism  Present. Phosphorus at goal.   5)CNS- Dementia stable  6)FEN  Normokalemic NOrmonatremic   7)Acid base Co2 at goal  8) Vascular- Pt with access issue -I cannot think it can be accessed.    Will plan to transfer to [place] as pt will benfit from aneurysm repair and PC- placement at the same time.   We can do femoral cath here but pt is high risk for bleeding ( dementia -risk for scratching again)   Plan:  Will plan for transfer to [facility].  I discussed this with Dr Juanetta Gosling, pt Son ( HCP) and pt grandson who was there.      Alpha Mysliwiec S 06/03/2013, 8:30 AM

## 2013-06-04 ENCOUNTER — Encounter (HOSPITAL_COMMUNITY)
Admission: EM | Disposition: A | Discharge status: Home / Self Care | Payer: Self-pay | Source: Home / Self Care | Attending: Pulmonary Disease

## 2013-06-04 ENCOUNTER — Encounter (HOSPITAL_COMMUNITY): Payer: Self-pay | Admitting: Anesthesiology

## 2013-06-04 ENCOUNTER — Inpatient Hospital Stay (HOSPITAL_COMMUNITY): Payer: Medicare HMO

## 2013-06-04 ENCOUNTER — Inpatient Hospital Stay (HOSPITAL_COMMUNITY): Payer: Medicare HMO | Admitting: Anesthesiology

## 2013-06-04 DIAGNOSIS — D696 Thrombocytopenia, unspecified: Secondary | ICD-10-CM | POA: Diagnosis present

## 2013-06-04 DIAGNOSIS — N186 End stage renal disease: Secondary | ICD-10-CM

## 2013-06-04 HISTORY — PX: INSERTION OF DIALYSIS CATHETER: SHX1324

## 2013-06-04 HISTORY — PX: LIGATION OF ARTERIOVENOUS  FISTULA: SHX5948

## 2013-06-04 LAB — BASIC METABOLIC PANEL
BUN: 63 mg/dL — ABNORMAL HIGH (ref 6–23)
Chloride: 99 mEq/L (ref 96–112)
Creatinine, Ser: 7.59 mg/dL — ABNORMAL HIGH (ref 0.50–1.10)
Glucose, Bld: 101 mg/dL — ABNORMAL HIGH (ref 70–99)
Potassium: 3.5 mEq/L (ref 3.5–5.1)

## 2013-06-04 LAB — CBC
HCT: 25.1 % — ABNORMAL LOW (ref 36.0–46.0)
HCT: 25.4 % — ABNORMAL LOW (ref 36.0–46.0)
Hemoglobin: 8.6 g/dL — ABNORMAL LOW (ref 12.0–15.0)
Hemoglobin: 8.7 g/dL — ABNORMAL LOW (ref 12.0–15.0)
MCH: 29.1 pg (ref 26.0–34.0)
MCHC: 33.9 g/dL (ref 30.0–36.0)
MCHC: 34.7 g/dL (ref 30.0–36.0)
MCV: 85.7 fL (ref 78.0–100.0)

## 2013-06-04 LAB — RENAL FUNCTION PANEL
BUN: 67 mg/dL — ABNORMAL HIGH (ref 6–23)
CO2: 24 mEq/L (ref 19–32)
Calcium: 7.8 mg/dL — ABNORMAL LOW (ref 8.4–10.5)
Creatinine, Ser: 8.21 mg/dL — ABNORMAL HIGH (ref 0.50–1.10)
GFR calc Af Amer: 5 mL/min — ABNORMAL LOW (ref >90)
Glucose, Bld: 97 mg/dL (ref 70–99)
Sodium: 138 mEq/L (ref 135–145)

## 2013-06-04 LAB — GLUCOSE, CAPILLARY: Glucose-Capillary: 108 mg/dL — ABNORMAL HIGH (ref 70–99)

## 2013-06-04 SURGERY — LIGATION OF ARTERIOVENOUS  FISTULA
Anesthesia: General | Site: Neck | Laterality: Right | Wound class: Clean

## 2013-06-04 MED ORDER — ACETAMINOPHEN 10 MG/ML IV SOLN: 1000.0000 mg | Freq: Once | INTRAVENOUS | Status: DC | PRN

## 2013-06-04 MED ORDER — PROMETHAZINE HCL 25 MG/ML IJ SOLN: 6.2500 mg | INTRAMUSCULAR | Status: DC | PRN

## 2013-06-04 MED ORDER — HEPARIN SODIUM (PORCINE) 1000 UNIT/ML IJ SOLN
INTRAMUSCULAR | Status: DC | PRN
  Administered 2013-06-04: 6000 [IU] via INTRAVENOUS

## 2013-06-04 MED ORDER — SODIUM CHLORIDE 0.9 % IR SOLN
Status: DC | PRN
  Administered 2013-06-04: 14:00:00

## 2013-06-04 MED ORDER — PROPOFOL 10 MG/ML IV BOLUS
INTRAVENOUS | Status: DC | PRN
  Administered 2013-06-04: 20 mg via INTRAVENOUS
  Administered 2013-06-04: 120 mg via INTRAVENOUS

## 2013-06-04 MED ORDER — DEXTROSE 5 % IV SOLN
INTRAVENOUS | Status: DC | PRN
  Administered 2013-06-04: 15:00:00 via INTRAVENOUS

## 2013-06-04 MED ORDER — MIDAZOLAM HCL 5 MG/5ML IJ SOLN
INTRAMUSCULAR | Status: DC | PRN
  Administered 2013-06-04: 0.5 mg via INTRAVENOUS

## 2013-06-04 MED ORDER — ONDANSETRON HCL 4 MG/2ML IJ SOLN
INTRAMUSCULAR | Status: DC | PRN
  Administered 2013-06-04: 4 mg via INTRAVENOUS

## 2013-06-04 MED ORDER — 0.9 % SODIUM CHLORIDE (POUR BTL) OPTIME
TOPICAL | Status: DC | PRN
  Administered 2013-06-04: 1000 mL

## 2013-06-04 MED ORDER — LIDOCAINE HCL (CARDIAC) 20 MG/ML IV SOLN
INTRAVENOUS | Status: DC | PRN
  Administered 2013-06-04: 50 mg via INTRAVENOUS

## 2013-06-04 MED ORDER — FENTANYL CITRATE 0.05 MG/ML IJ SOLN
INTRAMUSCULAR | Status: DC | PRN
  Administered 2013-06-04 (×3): 50 ug via INTRAVENOUS

## 2013-06-04 MED ORDER — DOXERCALCIFEROL 4 MCG/2ML IV SOLN
2.5000 ug | INTRAVENOUS | Status: DC
  Administered 2013-06-05: 2.5 ug via INTRAVENOUS
  Filled 2013-06-04: qty 2

## 2013-06-04 MED ORDER — THROMBIN 20000 UNITS EX SOLR
CUTANEOUS | Status: DC | PRN
  Administered 2013-06-04: 16:00:00 via TOPICAL

## 2013-06-04 MED ORDER — OXYCODONE HCL 5 MG PO TABS: 5.0000 mg | ORAL_TABLET | Freq: Once | ORAL | Status: DC | PRN

## 2013-06-04 MED ORDER — PROTAMINE SULFATE 10 MG/ML IV SOLN
INTRAVENOUS | Status: DC | PRN
  Administered 2013-06-04 (×2): 10 mg via INTRAVENOUS
  Administered 2013-06-04: 5 mg via INTRAVENOUS
  Administered 2013-06-04 (×2): 10 mg via INTRAVENOUS

## 2013-06-04 MED ORDER — HALOPERIDOL LACTATE 5 MG/ML IJ SOLN
2.0000 mg | Freq: Once | INTRAMUSCULAR | Status: AC
  Administered 2013-06-04: 2 mg via INTRAVENOUS

## 2013-06-04 MED ORDER — HEPARIN SODIUM (PORCINE) 1000 UNIT/ML IJ SOLN
INTRAMUSCULAR | Status: DC | PRN
  Administered 2013-06-04: 10 mL

## 2013-06-04 MED ORDER — OXYCODONE HCL 5 MG/5ML PO SOLN: 5.0000 mg | Freq: Once | ORAL | Status: DC | PRN

## 2013-06-04 MED ORDER — THROMBIN 20000 UNITS EX SOLR
CUTANEOUS | Status: AC
  Filled 2013-06-04: qty 20000

## 2013-06-04 MED ORDER — SODIUM CHLORIDE 0.9 % IV SOLN
INTRAVENOUS | Status: DC
  Administered 2013-06-04: 10 mL/h via INTRAVENOUS
  Administered 2013-06-04: 16:00:00 via INTRAVENOUS

## 2013-06-04 MED ORDER — PHENYLEPHRINE HCL 10 MG/ML IJ SOLN
INTRAMUSCULAR | Status: DC | PRN
  Administered 2013-06-04: 80 ug via INTRAVENOUS
  Administered 2013-06-04: 40 ug via INTRAVENOUS
  Administered 2013-06-04: 80 ug via INTRAVENOUS
  Administered 2013-06-04 (×2): 40 ug via INTRAVENOUS

## 2013-06-04 MED ORDER — LIDOCAINE-EPINEPHRINE (PF) 1 %-1:200000 IJ SOLN
INTRAMUSCULAR | Status: AC
  Filled 2013-06-04: qty 10

## 2013-06-04 MED ORDER — HALOPERIDOL LACTATE 5 MG/ML IJ SOLN
INTRAMUSCULAR | Status: AC
  Filled 2013-06-04: qty 1

## 2013-06-04 MED ORDER — METOCLOPRAMIDE HCL 5 MG/ML IJ SOLN: 10.0000 mg | Freq: Once | INTRAMUSCULAR | Status: DC | PRN

## 2013-06-04 MED ORDER — HALOPERIDOL LACTATE 5 MG/ML IJ SOLN: 5.0000 mg | Freq: Once | INTRAMUSCULAR | Status: DC

## 2013-06-04 MED ORDER — HYDROMORPHONE HCL PF 1 MG/ML IJ SOLN: 0.2500 mg | INTRAMUSCULAR | Status: DC | PRN

## 2013-06-04 MED ORDER — HEPARIN SODIUM (PORCINE) 1000 UNIT/ML IJ SOLN
INTRAMUSCULAR | Status: AC
  Filled 2013-06-04: qty 1

## 2013-06-04 MED ORDER — BUPIVACAINE HCL 0.5 % IJ SOLN
INTRAMUSCULAR | Status: AC
  Filled 2013-06-04: qty 1

## 2013-06-04 MED ORDER — ARTIFICIAL TEARS OP OINT
TOPICAL_OINTMENT | OPHTHALMIC | Status: DC | PRN
  Administered 2013-06-04: 1 via OPHTHALMIC

## 2013-06-04 MED ORDER — FENTANYL CITRATE 0.05 MG/ML IJ SOLN: 25.0000 ug | INTRAMUSCULAR | Status: DC | PRN

## 2013-06-04 SURGICAL SUPPLY — 69 items
BAG DECANTER FOR FLEXI CONT (MISCELLANEOUS) IMPLANT
BANDAGE ELASTIC 6 VELCRO ST LF (GAUZE/BANDAGES/DRESSINGS) ×3 IMPLANT
BANDAGE ESMARK 6X9 LF (GAUZE/BANDAGES/DRESSINGS) ×2 IMPLANT
BANDAGE GAUZE ELAST BULKY 4 IN (GAUZE/BANDAGES/DRESSINGS) ×3 IMPLANT
BNDG ESMARK 6X9 LF (GAUZE/BANDAGES/DRESSINGS) ×3
CANISTER SUCTION 2500CC (MISCELLANEOUS) ×3 IMPLANT
CATH CANNON HEMO 15F 50CM (CATHETERS) IMPLANT
CATH CANNON HEMO 15FR 19 (HEMODIALYSIS SUPPLIES) IMPLANT
CATH CANNON HEMO 15FR 23CM (HEMODIALYSIS SUPPLIES) ×3 IMPLANT
CATH CANNON HEMO 15FR 31CM (HEMODIALYSIS SUPPLIES) IMPLANT
CATH CANNON HEMO 15FR 32CM (HEMODIALYSIS SUPPLIES) IMPLANT
CATH STRAIGHT 5FR 65CM (CATHETERS) IMPLANT
CLOTH BEACON ORANGE TIMEOUT ST (SAFETY) ×3 IMPLANT
COVER PROBE W GEL 5X96 (DRAPES) ×3 IMPLANT
COVER SURGICAL LIGHT HANDLE (MISCELLANEOUS) ×3 IMPLANT
CUFF TOURNIQUET SINGLE 18IN (TOURNIQUET CUFF) ×3 IMPLANT
DERMABOND ADVANCED (GAUZE/BANDAGES/DRESSINGS) ×2
DERMABOND ADVANCED .7 DNX12 (GAUZE/BANDAGES/DRESSINGS) ×4 IMPLANT
DRAPE C-ARM 42X72 X-RAY (DRAPES) ×3 IMPLANT
DRAPE CHEST BREAST 15X10 FENES (DRAPES) ×3 IMPLANT
ELECT REM PT RETURN 9FT ADLT (ELECTROSURGICAL) ×3
ELECTRODE REM PT RTRN 9FT ADLT (ELECTROSURGICAL) ×2 IMPLANT
GAUZE SPONGE 2X2 8PLY STRL LF (GAUZE/BANDAGES/DRESSINGS) ×2 IMPLANT
GAUZE SPONGE 4X4 16PLY XRAY LF (GAUZE/BANDAGES/DRESSINGS) IMPLANT
GEL ULTRASOUND 20GR AQUASONIC (MISCELLANEOUS) IMPLANT
GLOVE BIO SURGEON STRL SZ7 (GLOVE) ×6 IMPLANT
GLOVE BIOGEL PI IND STRL 6.5 (GLOVE) ×4 IMPLANT
GLOVE BIOGEL PI IND STRL 7.5 (GLOVE) ×4 IMPLANT
GLOVE BIOGEL PI INDICATOR 6.5 (GLOVE) ×2
GLOVE BIOGEL PI INDICATOR 7.5 (GLOVE) ×2
GLOVE SURG SS PI 6.5 STRL IVOR (GLOVE) ×3 IMPLANT
GLOVE SURG SS PI 7.5 STRL IVOR (GLOVE) ×3 IMPLANT
GOWN STRL NON-REIN LRG LVL3 (GOWN DISPOSABLE) ×15 IMPLANT
KIT BASIN OR (CUSTOM PROCEDURE TRAY) ×3 IMPLANT
KIT ROOM TURNOVER OR (KITS) ×3 IMPLANT
NEEDLE 18GX1X1/2 (RX/OR ONLY) (NEEDLE) ×6 IMPLANT
NEEDLE HYPO 25GX1X1/2 BEV (NEEDLE) IMPLANT
NS IRRIG 1000ML POUR BTL (IV SOLUTION) ×3 IMPLANT
PACK CV ACCESS (CUSTOM PROCEDURE TRAY) ×3 IMPLANT
PACK SURGICAL SETUP 50X90 (CUSTOM PROCEDURE TRAY) IMPLANT
PAD ARMBOARD 7.5X6 YLW CONV (MISCELLANEOUS) ×6 IMPLANT
SET MICROPUNCTURE 5F STIFF (MISCELLANEOUS) IMPLANT
SOAP 2 % CHG 4 OZ (WOUND CARE) ×3 IMPLANT
SPONGE GAUZE 2X2 STER 10/PKG (GAUZE/BANDAGES/DRESSINGS) ×1
SPONGE GAUZE 4X4 12PLY (GAUZE/BANDAGES/DRESSINGS) ×3 IMPLANT
SPONGE SURGIFOAM ABS GEL 100 (HEMOSTASIS) ×3 IMPLANT
STAPLER VISISTAT 35W (STAPLE) ×3 IMPLANT
SUT ETHILON 3 0 PS 1 (SUTURE) ×3 IMPLANT
SUT MNCRL AB 4-0 PS2 18 (SUTURE) ×6 IMPLANT
SUT PROLENE 5 0 C 1 24 (SUTURE) ×12 IMPLANT
SUT PROLENE 6 0 BV (SUTURE) IMPLANT
SUT PROLENE 6 0 CC (SUTURE) ×6 IMPLANT
SUT SILK 0 TIES 10X30 (SUTURE) ×3 IMPLANT
SUT VIC AB 3-0 SH 27 (SUTURE) ×1
SUT VIC AB 3-0 SH 27X BRD (SUTURE) ×2 IMPLANT
SWAB COLLECTION DEVICE MRSA (MISCELLANEOUS) IMPLANT
SYR 20CC LL (SYRINGE) ×6 IMPLANT
SYR 30ML LL (SYRINGE) IMPLANT
SYR 3ML LL SCALE MARK (SYRINGE) ×3 IMPLANT
SYR 5ML LL (SYRINGE) ×3 IMPLANT
SYR CONTROL 10ML LL (SYRINGE) IMPLANT
SYRINGE 10CC LL (SYRINGE) IMPLANT
TAPE CLOTH SURG 4X10 WHT LF (GAUZE/BANDAGES/DRESSINGS) ×3 IMPLANT
TOWEL OR 17X24 6PK STRL BLUE (TOWEL DISPOSABLE) ×6 IMPLANT
TOWEL OR 17X26 10 PK STRL BLUE (TOWEL DISPOSABLE) ×3 IMPLANT
TUBE ANAEROBIC SPECIMEN COL (MISCELLANEOUS) IMPLANT
UNDERPAD 30X30 INCONTINENT (UNDERPADS AND DIAPERS) ×3 IMPLANT
WATER STERILE IRR 1000ML POUR (IV SOLUTION) ×3 IMPLANT
WIRE AMPLATZ SS-J .035X180CM (WIRE) IMPLANT

## 2013-06-04 NOTE — Preoperative (Signed)
Beta Blockers   Reason not to administer Beta Blockers:Not Applicable 

## 2013-06-04 NOTE — Interval H&P Note (Signed)
[facility]  History and Physical Update  The patient was interviewed and re-examined.  The patient's previous History and Physical has been reviewed and is unchanged from Dr. Candie Chroman consult on: 06/04/13.  There is no change in the plan of care: Ligation of Left brachiocephalic arteriovenous fistula, placement of tunneled dialysis catheter.  Leonides Sake, MD [facility] [place] Office: [phone] Pager: [phone]  06/04/2013, 2:14 PM '

## 2013-06-04 NOTE — H&P (View-Only) (Signed)
Vascular Surgery Consultation  Reason for Consult: End-stage renal disease with history of significant bleeding from left upper arm AV fistula 48 hours ago  HPI: [name] is a 79 y.o. female who presents for evaluation of bleeding from left upper arm AV fistula 48 hours ago. This patient who has dementia was found by her son at home to have significant bleeding from the left upper arm fistula 2 days ago. She was taken to [facility] and was transfused 3 units of packed cells. She has been stable hemodynamically and today was transferred to [facility]. She has no history of previous bleeding from the fistula which has been in place 4 years.   Past Medical History  Diagnosis Date  . Renal disorder    Past Surgical History  Procedure Laterality Date  . Dialysis fistula creation    . Cholecystectomy     History   Social History  . Marital Status: Divorced    Spouse Name: N/A    Number of Children: N/A  . Years of Education: N/A   Social History Main Topics  . Smoking status: Never Smoker   . Smokeless tobacco: None  . Alcohol Use: No  . Drug Use: No  . Sexually Active:    Other Topics Concern  . None   Social History Narrative  . None   No family history on file. Allergies  Allergen Reactions  . Sulfa Antibiotics Other (See Comments)    unknown  . Sulfur    Prior to Admission medications   Medication Sig Start Date End Date Taking? Authorizing Provider  ceftAZIDime (FORTAZ IN D5W) 1-5 GM/50ML-% Inject 1 g into the vein once.   Yes Historical Provider, MD  donepezil (ARICEPT) 10 MG tablet Take 10 mg by mouth at bedtime.   Yes Historical Provider, MD  memantine (NAMENDA) 10 MG tablet Take 10 mg by mouth 2 (two) times daily. For memory   Yes Historical Provider, MD  multivitamin (RENA-VIT) TABS tablet Take 1 tablet by mouth daily.   Yes Historical Provider, MD  sodium chloride 0.9 % SOLN 250 mL with vancomycin 1000 MG SOLR 1,000 mg Inject 1,000 mg  into the vein once.   Yes Historical Provider, MD  torsemide (DEMADEX) 20 MG tablet Take 20 mg by mouth daily.   Yes Historical Provider, MD     Positive ROS: Unavailable because patient has dementia and is no  respond  A to questions appropriatelyll other systems have been reviewed and were otherwise negative with the exception of those mentioned in the HPI and as above.  Physical Exam: Filed Vitals:   06/04/13 0421  BP: 119/71  Pulse: 92  Temp: 97.6 F (36.4 C)  Resp: 24    General: Alert, no acute distress HEENT: Normal for age Cardiovascular: Regular rate and rhythm. Carotid pulses 2+, no bruits audible Respiratory: Clear to auscultation. No cyanosis, no use of accessory musculature GI: No organomegaly, abdomen is soft and non-tender Skin: No lesions in the area of chief complaint Neurologic: Sensation intact distally Psychiatric: Patient is competent for consent with normal mood and affect Musculoskeletal: No obvious deformities Extremities: left upper extremity with markedly dilated brachial cephalic AV fistula. There is aneurysmal dilatation throughout the significant area in the midportion measuring up to 4 cm in diameter. There is a small eschar in the center portion of this. There is no active bleeding or purulent drainage. There is an excellent pulse and palpable thrill. 1+ radial pulse palpable distally.      Assessment/Plan:  Patient needs left upper arm brachial to cephalic fistula which was created at [facility] 4 years ago ligated because of bleeding 2 days ago and insertion of hemodialysis catheter Discussed with patient's son David who is her power of attorney and will sign consent today we will plan surgery later today  James Lawson, MD 06/04/2013 7:33 AM    

## 2013-06-04 NOTE — Anesthesia Procedure Notes (Signed)
Procedure Name: LMA Insertion Date/Time: 06/04/2013 2:37 PM Performed by: Darcey Nora B Pre-anesthesia Checklist: Patient identified, Patient being monitored, Emergency Drugs available and Suction available Patient Re-evaluated:Patient Re-evaluated prior to inductionOxygen Delivery Method: Circle system utilized Preoxygenation: Pre-oxygenation with 100% oxygen Intubation Type: IV induction Ventilation: Mask ventilation without difficulty LMA: LMA inserted LMA Size: 4.0 Number of attempts: 1 Placement Confirmation: breath sounds checked- equal and bilateral and positive ETCO2 Tube secured with: Tape (across cheeks) Dental Injury: Teeth and Oropharynx as per pre-operative assessment

## 2013-06-04 NOTE — Op Note (Addendum)
OPERATIVE NOTE  PROCEDURE: 1.  Right internal jugular vein tunneled dialysis catheter placement 2.  Right internal jugular vein cannulation under ultrasound guidance 3.  Ligation of left brachiocephalic arteriovenous fistula  4.  Excision of ulcerated segment of left brachiocephalic arteriovenous fistula    PRE-OPERATIVE DIAGNOSIS: end-stage renal failure  POST-OPERATIVE DIAGNOSIS: same as above  SURGEON: Leonides Sake, MD  ANESTHESIA: general  ESTIMATED BLOOD LOSS: 30 cc  FINDING(S): 1.  Tips of the catheter in the right atrium on fluoroscopy 2.  No obvious pneumothorax on fluoroscopy  SPECIMEN(S):  none  INDICATIONS:   [name] is a 79 y.o. female who presents with bleeding from her left aneurysmal brachiocephalic arteriovenous fistula.  Dr. Hart Rochester discussed with the patient and family ligating the fistula and placement of tunneled dialysis catheter.  The patient is aware the risks of these procedures include but are not limited to: bleeding, infection, central venous injury, pneumothorax, possible venous stenosis, possible malpositioning in the venous system, and possible infections related to long-term catheter presence.  The patient was aware of these risks and agreed to proceed.  DESCRIPTION: After written full informed consent was obtained from the patient, the patient was taken back to the operating room.  Prior to induction, the patient was given IV antibiotics.  After obtaining adequate sedation, the patient was prepped and draped in the standard fashion for a chest or neck tunneled dialysis catheter placement.  Under ultrasound guidance, the right internal jugular vein was cannulated with the 18 gauge needle.  A J-wire was then placed down in the inferior vena cava under fluroscopic guidance.  The wire was then secured in place with a clamp to the drapes.  I then made stab incisions at the neck and exit sites.   I dissected from the exit site to the cannulation site with a  metal tunneler.   The subcutaneous tunnel was dilated by passing a plastic dilator over the metal dissector. The wire was then unclamped and I removed the needle.  The skin tract and venotomy was dilated serially with dilators.  Finally, the dilator-sheath was placed under fluroscopic guidance into the superior vena cava.  The dilator and wire were removed.  A 23 cm Diatek catheter was placed under fluoroscopic guidance down into the right atrium.  The sheath was broken and peeled away while holding the catheter cuff at the level of the skin.  The back end of this catheter was transected, revealing the two lumens of this catheter.  The ports were docked onto these two lumens.  The catheter hub was then screwed into place.  Each port was tested by aspirating and flushing.  No resistance was noted.  Each port was then thoroughly flushed with heparinized saline.  The catheter was secured in placed with two interrupted stitches of 3-0 Nylon tied to the catheter.  The neck incision was closed with a U-stitch of 4-0 Monocryl.  The neck and chest incision were cleaned and sterile bandages applied.  Each port was then loaded with concentrated heparin (1000 Units/mL) at the manufacturer recommended volumes to each port.  Sterile caps were applied to each port.  On completion fluoroscopy, the tips of the catheter were in the right atrium, and there was no evidence of pneumothorax.  The drapes were taken down and the OR table was repositioned for a left arm access procedure.  The patient was reprepped and draped.  I gave the patient 6000 units of Heparin intravenously to obtain anticoagulation.  After waiting  5 minutes, I placed a sterile tourniquet in the left upper arm.  I exsanguinated the left arm with a Esmark bandage.  The tourniquet was inflated to 250 mm Hg pressure.  I made an elliptical incision around the ulcer in the mid-arm.  This ulcer lead directly to the brachiocephalic arteriovenous fistula.  I oversewed the  proximal cephalic vein within the vein with a running double layer of 5-0 Prolene.  I then probed the fistula with a Kelly clamp to identify the anastomosis.  I made a longitudinal incision over the anastomosis.  I opened the fistula here.  The anastomosis > 1 cm in diameter, so I sharply tailored the vein over the anastomosis and reapproximated the vein as a patch with a double layer of 5-0 Prolene.   At this point, I reversed anticoagulation with 45 mg of Protamine and deflated the tourniquet.  Thrombin and gelfoam was placed into both wound.  There continued to be bleeding from wound, suggesting a sidebranch was patent.  I did not think to dissecting out > 10 cm of fistula to find the sidebranch was necessary.  I oversewed the vein just distal to the anastomosis with a double layer of 5-0 Prolene.  I then oversewed the fistula at the level of ulcer.  My plan was to let this static blood thrombose and control the side branch subsequently.    I repaired the subcutaneous tissue over the brachial artery with a double layer of 3-0 Vicryl.  I also reapproximated the subcutaneous tissue over the mid-arm fistula with a running layer of 3-0 Vicryl.  I stapled the skin at both incisions.  By the end of the case, the excluded segment of fistula had reinflated.  I wrapped the left upper arm with a Kerlix and an ACE wrap.  Distally there was an easily palpable radial pulse.  COMPLICATIONS: none  CONDITION: stable  Leonides Sake, MD [facility] [place] Office: [phone] Pager: [phone]  06/04/2013, 3:02 PM

## 2013-06-04 NOTE — Progress Notes (Addendum)
Outpatient HD Orders: ([facility]ita Menan/[facility] )  MWF 3 1/2 hours 2K 2.5 Ca BFR 400 DFR 600 Dialysate temp 36 EDW 71.5 kg (usual access AVF) Heparin 1000 bolus  500/hour Hectorol 2.5 mcg TIW No EPO

## 2013-06-04 NOTE — Progress Notes (Signed)
[facility] Progress Note [place] TEAM 1 - Stepdown/ICU TEAM   [name] ZOX:[id number] DOB: [birth year] DOA: 06/02/2013 PCP: Fredirick Maudlin, MD  Brief narrative: 79 y.o. female w/ end-stage renal disease and on hemodialysis. Developed active bleeding from her HD access and on the morning of admission was found in a pool of blood. When she arrived to the emergency room, the bleeding spontaneously stopped. She is known to Dr Fausto Skillern, nephrology. She was now admitted because of significant anemia from her bleeding. Her Hgb was 6.4. She was transfused with 3 units PRBC's prior to transfer. Because of possible need to repair her graft she was transferred to [facility].   Assessment/Plan: Active Problems:   Acute blood loss anemia due to HD access bleeding -Hgb up to 7.8 -since no further bleeding no indication to cycle Hgb checks    CKD (chronic kidney disease) stage V requiring chronic dialysis -VVS following -plan surgical repair HD access 7/17 and placement of PermCath    Thrombocytopenia, unspecified -platelets were normal at 173,000 04/25/2013 and began subtly trending downward 04/26/2013-currently greater than 100,000 -? Due to post HD bleeding then vs HIT (regular exposure to heparin w/ HD tx's) -will check HIT panel and follow CBC    Senile dementia -very agitated this am so was given 1x dose Haldol -sl better after son at bedside -safety sitter   DVT prophylaxis: SCDs Code Status: Full Family Communication:  Spoke with son at bedside Disposition Plan: Transfer to renal floor postoperatively  Isolation: None Nutritional Status: Likely chronic protein calorie malnutrition related to underlying dementia  Consultants: Vascular surgery Nephrology  Procedures: Ligation AV fistula and PermCath placement depending  Antibiotics: Zinacef x1 dose pre-op  HPI/Subjective:  Patient awakened very agitated. Yelling out at times. States "get me out of here. If  I don't get out of here on the dye"   Objective: Blood pressure 159/69, pulse 78, temperature 97.4 F (36.3 C), temperature source Oral, resp. rate 24, height 5\' 3"  (1.6 m), weight 70.3 kg (154 lb 15.7 oz), SpO2 99.00%.  Intake/Output Summary (Last 24 hours) at 06/04/13 1209 Last data filed at 06/03/13 1300  Gross per 24 hour  Intake    240 ml  Output      0 ml  Net    240 ml     Exam: General: No acute respiratory distress Lungs: Clear to auscultation bilaterally without wheezes or crackles, RA Cardiovascular: Regular rate and rhythm without murmur gallop or rub normal S1 and S2, no peripheral edema or JVD Abdomen: Nontender, nondistended, soft, bowel sounds positive, no rebound, no ascites, no appreciable mass Musculoskeletal: No significant cyanosis, clubbing of bilateral lower extremities Neurological: Alert and agitated, moves all extremities x 4 without focal neurological deficits, CN 2-12 grossly intact  Scheduled Meds: Scheduled Meds: . cefUROXime (ZINACEF)  IV  1.5 g Intravenous 60 min Pre-Op  . donepezil  10 mg Oral QHS  . ferrous sulfate  325 mg Oral BID WC  . haloperidol lactate      . memantine  10 mg Oral BID  . multivitamin  1 tablet Oral QHS  . sodium chloride  3 mL Intravenous Q12H   Continuous Infusions:   **Reviewed in detail by the Attending Physician   Data Reviewed: Basic Metabolic Panel:  Recent Labs Lab 06/02/13 0942 06/03/13 0447 06/04/13 0021 06/04/13 0622  NA 138 139 137 138  K 3.5 3.7 3.5 3.5  CL 99 102 99 100  CO2 26 27 25  24  GLUCOSE 125* 77 101* 97  BUN 49* 56* 63* 67*  CREATININE 5.67* 6.62* 7.59* 8.21*  CALCIUM 7.8* 7.6* 7.7* 7.8*  PHOS  --   --   --  6.6*   Liver Function Tests:  Recent Labs Lab 06/03/13 0447 06/04/13 0622  AST 17  --   ALT 10  --   ALKPHOS 53  --   BILITOT 0.5  --   PROT 5.1*  --   ALBUMIN 2.2* 2.4*   No results found for this basename: LIPASE, AMYLASE,  in the last 168 hours No results found  for this basename: AMMONIA,  in the last 168 hours CBC:  Recent Labs Lab 06/02/13 0942 06/03/13 0447 06/03/13 2101 06/04/13 0021 06/04/13 0622  WBC 8.2 6.3 7.7 7.7 8.5  NEUTROABS 6.7  --   --   --   --   HGB 6.4* 8.8* 8.7* 8.6* 8.7*  HCT 19.5* 25.8* 24.8* 25.4* 25.1*  MCV 92.4 87.8 85.2 85.8 85.7  PLT 141* 127* 126* 131* 133*   Cardiac Enzymes: No results found for this basename: CKTOTAL, CKMB, CKMBINDEX, TROPONINI,  in the last 168 hours BNP (last 3 results) No results found for this basename: PROBNP,  in the last 8760 hours CBG:  Recent Labs Lab 06/02/13 0941 06/03/13 1719 06/03/13 2142 06/04/13 0832  GLUCAP 107* 101* 111* 108*    Recent Results (from the past 240 hour(s))  MRSA PCR SCREENING     Status: None   Collection Time    06/02/13  1:15 PM      Result Value Range Status   MRSA by PCR NEGATIVE  NEGATIVE Final   Comment:            The GeneXpert MRSA Assay (FDA     approved for NASAL specimens     only), is one component of a     comprehensive MRSA colonization     surveillance program. It is not     intended to diagnose MRSA     infection nor to guide or     monitor treatment for     MRSA infections.     Studies:  Recent x-ray studies have been reviewed in detail by the Attending Physician  Patient seen and examined with NP Junious Silk , agree with her assessment and plan . Richarda Overlie MD   Junious Silk, ANP [facility] Office  [phone] Pager [phone]  **If unable to reach the above provider after paging please contact the Flow Manager @ [phone]  On-Call/Text Page:      [email or web address]      password [email or web address]  If 7PM-7AM, please contact night-coverage [email or web address] Password [email or web address] 06/04/2013, 12:09 PM   LOS: 2 days

## 2013-06-04 NOTE — Anesthesia Postprocedure Evaluation (Signed)
  Anesthesia Post-op Note  Patient: [name]  Procedure(s) Performed: Procedure(s): LIGATION OF ARTERIOVENOUS  FISTULA (Left) INSERTION OF DIALYSIS CATHETER (Right)  Patient Location: PACU  Anesthesia Type:General  Level of Consciousness: awake, alert  and oriented  Airway and Oxygen Therapy: Patient Spontanous Breathing  Post-op Pain: none  Post-op Assessment: Post-op Vital signs reviewed, Patient's Cardiovascular Status Stable, Respiratory Function Stable, Patent Airway and Pain level controlled  Post-op Vital Signs: stable  Complications: No apparent anesthesia complications

## 2013-06-04 NOTE — Consult Note (Signed)
Vascular Surgery Consultation  Reason for Consult: End-stage renal disease with history of significant bleeding from left upper arm AV fistula 48 hours ago  HPI: [name] is a 79 y.o. female who presents for evaluation of bleeding from left upper arm AV fistula 48 hours ago. This patient who has dementia was found by her son at home to have significant bleeding from the left upper arm fistula 2 days ago. She was taken to [facility] and was transfused 3 units of packed cells. She has been stable hemodynamically and today was transferred to [facility]. She has no history of previous bleeding from the fistula which has been in place 4 years.   Past Medical History  Diagnosis Date  . Renal disorder    Past Surgical History  Procedure Laterality Date  . Dialysis fistula creation    . Cholecystectomy     History   Social History  . Marital Status: Divorced    Spouse Name: N/A    Number of Children: N/A  . Years of Education: N/A   Social History Main Topics  . Smoking status: Never Smoker   . Smokeless tobacco: None  . Alcohol Use: No  . Drug Use: No  . Sexually Active:    Other Topics Concern  . None   Social History Narrative  . None   No family history on file. Allergies  Allergen Reactions  . Sulfa Antibiotics Other (See Comments)    unknown  . Sulfur    Prior to Admission medications   Medication Sig Start Date End Date Taking? Authorizing Provider  ceftAZIDime (FORTAZ IN D5W) 1-5 GM/50ML-% Inject 1 g into the vein once.   Yes Historical Provider, MD  donepezil (ARICEPT) 10 MG tablet Take 10 mg by mouth at bedtime.   Yes Historical Provider, MD  memantine (NAMENDA) 10 MG tablet Take 10 mg by mouth 2 (two) times daily. For memory   Yes Historical Provider, MD  multivitamin (RENA-VIT) TABS tablet Take 1 tablet by mouth daily.   Yes Historical Provider, MD  sodium chloride 0.9 % SOLN 250 mL with vancomycin 1000 MG SOLR 1,000 mg Inject 1,000 mg  into the vein once.   Yes Historical Provider, MD  torsemide (DEMADEX) 20 MG tablet Take 20 mg by mouth daily.   Yes Historical Provider, MD     Positive ROS: Unavailable because patient has dementia and is no  respond  A to questions appropriatelyll other systems have been reviewed and were otherwise negative with the exception of those mentioned in the HPI and as above.  Physical Exam: Filed Vitals:   06/04/13 0421  BP: 119/71  Pulse: 92  Temp: 97.6 F (36.4 C)  Resp: 24    General: Alert, no acute distress HEENT: Normal for age Cardiovascular: Regular rate and rhythm. Carotid pulses 2+, no bruits audible Respiratory: Clear to auscultation. No cyanosis, no use of accessory musculature GI: No organomegaly, abdomen is soft and non-tender Skin: No lesions in the area of chief complaint Neurologic: Sensation intact distally Psychiatric: Patient is competent for consent with normal mood and affect Musculoskeletal: No obvious deformities Extremities: left upper extremity with markedly dilated brachial cephalic AV fistula. There is aneurysmal dilatation throughout the significant area in the midportion measuring up to 4 cm in diameter. There is a small eschar in the center portion of this. There is no active bleeding or purulent drainage. There is an excellent pulse and palpable thrill. 1+ radial pulse palpable distally.  Assessment/Plan:  Patient needs left upper arm brachial to cephalic fistula which was created at [facility] 4 years ago ligated because of bleeding 2 days ago and insertion of hemodialysis catheter Discussed with patient's son Onalee Hua who is her power of attorney and will sign consent today we will plan surgery later today  Josephina Gip, MD 06/04/2013 7:33 AM

## 2013-06-04 NOTE — Consult Note (Signed)
Reason for Consult: Dialysis and dialysis related needs Referring Physician: [name] is an 79 y.o. female with PMhx dementia and ESRD- just out of rehab after hospitalization for PNA and deconditioning. She is on HD at the [facility] unit on MWF.  She was in her USOH until she had spontaneous bleeding from her AVF which she has had for 4 years- her family found her in a pool of blood at home and brought her to the hospital yesterday, her hgb was 6.4- improved to in the 8's after transfusion, bleeding has now stopped.  Vascular surgery has seen her and the plan is for ligation of her AVF and PC placement sometime today.   Pt last had HD on Monday- labs this AM do not indicate an urgent need for HD.  The pt is without complaint.     Dialyzes at [facility]  on MWF . Primary Nephrologist [place]. EDW unknown. HD [place] unknown at this time, Dialyzer unknown, Heparin unknown. Access previous AVF- will now be PC.  Past Medical History  Diagnosis Date  . Renal disorder     Past Surgical History  Procedure Laterality Date  . Dialysis fistula creation    . Cholecystectomy      No family history on file.  Social History:  reports that she has never smoked. She does not have any smokeless tobacco history on file. She reports that she does not drink alcohol or use illicit drugs.  Allergies:  Allergies  Allergen Reactions  . Sulfa Antibiotics Other (See Comments)    unknown  . Sulfur     Medications: I have reviewed the patient's current medications.   Results for orders placed during the hospital encounter of 06/02/13 (from the past 48 hour(s))  MRSA PCR SCREENING     Status: None   Collection Time    06/02/13  1:15 PM      Result Value Range   MRSA by PCR NEGATIVE  NEGATIVE   Comment:            The GeneXpert MRSA Assay (FDA     approved for NASAL specimens     only), is one component of a     comprehensive MRSA colonization     surveillance program. It is  not     intended to diagnose MRSA     infection nor to guide or     monitor treatment for     MRSA infections.  COMPREHENSIVE METABOLIC PANEL     Status: Abnormal   Collection Time    06/03/13  4:47 AM      Result Value Range   Sodium 139  135 - 145 mEq/L   Potassium 3.7  3.5 - 5.1 mEq/L   Chloride 102  96 - 112 mEq/L   CO2 27  19 - 32 mEq/L   Glucose, Bld 77  70 - 99 mg/dL   BUN 56 (*) 6 - 23 mg/dL   Creatinine, Ser 1.61 (*) 0.50 - 1.10 mg/dL   Calcium 7.6 (*) 8.4 - 10.5 mg/dL   Total Protein 5.1 (*) 6.0 - 8.3 g/dL   Albumin 2.2 (*) 3.5 - 5.2 g/dL   AST 17  0 - 37 U/L   ALT 10  0 - 35 U/L   Alkaline Phosphatase 53  39 - 117 U/L   Total Bilirubin 0.5  0.3 - 1.2 mg/dL   GFR calc non Af Amer 5 (*) >90 mL/min   GFR calc Af  Amer 6 (*) >90 mL/min   Comment:            The eGFR has been calculated     using the CKD EPI equation.     This calculation has not been     validated in all clinical     situations.     eGFR's persistently     <90 mL/min signify     possible Chronic Kidney Disease.  CBC     Status: Abnormal   Collection Time    06/03/13  4:47 AM      Result Value Range   WBC 6.3  4.0 - 10.5 K/uL   RBC 2.94 (*) 3.87 - 5.11 MIL/uL   Hemoglobin 8.8 (*) 12.0 - 15.0 g/dL   Comment: DELTA CHECK NOTED   HCT 25.8 (*) 36.0 - 46.0 %   MCV 87.8  78.0 - 100.0 fL   MCH 29.9  26.0 - 34.0 pg   MCHC 34.1  30.0 - 36.0 g/dL   RDW 95.2 (*) 84.1 - 32.4 %   Platelets 127 (*) 150 - 400 K/uL  GLUCOSE, CAPILLARY     Status: Abnormal   Collection Time    06/03/13  5:19 PM      Result Value Range   Glucose-Capillary 101 (*) 70 - 99 mg/dL   Comment 1 Notify RN     Comment 2 Documented in Chart    CBC     Status: Abnormal   Collection Time    06/03/13  9:01 PM      Result Value Range   WBC 7.7  4.0 - 10.5 K/uL   RBC 2.91 (*) 3.87 - 5.11 MIL/uL   Hemoglobin 8.7 (*) 12.0 - 15.0 g/dL   HCT 40.1 (*) 02.7 - 25.3 %   MCV 85.2  78.0 - 100.0 fL   MCH 29.9  26.0 - 34.0 pg   MCHC 35.1   30.0 - 36.0 g/dL   RDW 66.4 (*) 40.3 - 47.4 %   Platelets 126 (*) 150 - 400 K/uL  GLUCOSE, CAPILLARY     Status: Abnormal   Collection Time    06/03/13  9:42 PM      Result Value Range   Glucose-Capillary 111 (*) 70 - 99 mg/dL   Comment 1 Notify RN    BASIC METABOLIC PANEL     Status: Abnormal   Collection Time    06/04/13 12:21 AM      Result Value Range   Sodium 137  135 - 145 mEq/L   Potassium 3.5  3.5 - 5.1 mEq/L   Chloride 99  96 - 112 mEq/L   CO2 25  19 - 32 mEq/L   Glucose, Bld 101 (*) 70 - 99 mg/dL   BUN 63 (*) 6 - 23 mg/dL   Creatinine, Ser 2.59 (*) 0.50 - 1.10 mg/dL   Calcium 7.7 (*) 8.4 - 10.5 mg/dL   GFR calc non Af Amer 4 (*) >90 mL/min   GFR calc Af Amer 5 (*) >90 mL/min   Comment:            The eGFR has been calculated     using the CKD EPI equation.     This calculation has not been     validated in all clinical     situations.     eGFR's persistently     <90 mL/min signify     possible Chronic Kidney Disease.  CBC     Status: Abnormal  Collection Time    06/04/13 12:21 AM      Result Value Range   WBC 7.7  4.0 - 10.5 K/uL   RBC 2.96 (*) 3.87 - 5.11 MIL/uL   Hemoglobin 8.6 (*) 12.0 - 15.0 g/dL   HCT 40.9 (*) 81.1 - 91.4 %   MCV 85.8  78.0 - 100.0 fL   MCH 29.1  26.0 - 34.0 pg   MCHC 33.9  30.0 - 36.0 g/dL   RDW 78.2 (*) 95.6 - 21.3 %   Platelets 131 (*) 150 - 400 K/uL  PROTIME-INR     Status: None   Collection Time    06/04/13 12:21 AM      Result Value Range   Prothrombin Time 14.1  11.6 - 15.2 seconds   INR 1.11  0.00 - 1.49  RENAL FUNCTION PANEL     Status: Abnormal   Collection Time    06/04/13  6:22 AM      Result Value Range   Sodium 138  135 - 145 mEq/L   Potassium 3.5  3.5 - 5.1 mEq/L   Chloride 100  96 - 112 mEq/L   CO2 24  19 - 32 mEq/L   Glucose, Bld 97  70 - 99 mg/dL   BUN 67 (*) 6 - 23 mg/dL   Creatinine, Ser 0.86 (*) 0.50 - 1.10 mg/dL   Calcium 7.8 (*) 8.4 - 10.5 mg/dL   Phosphorus 6.6 (*) 2.3 - 4.6 mg/dL   Albumin 2.4  (*) 3.5 - 5.2 g/dL   GFR calc non Af Amer 4 (*) >90 mL/min   GFR calc Af Amer 5 (*) >90 mL/min   Comment:            The eGFR has been calculated     using the CKD EPI equation.     This calculation has not been     validated in all clinical     situations.     eGFR's persistently     <90 mL/min signify     possible Chronic Kidney Disease.  CBC     Status: Abnormal   Collection Time    06/04/13  6:22 AM      Result Value Range   WBC 8.5  4.0 - 10.5 K/uL   RBC 2.93 (*) 3.87 - 5.11 MIL/uL   Hemoglobin 8.7 (*) 12.0 - 15.0 g/dL   HCT 57.8 (*) 46.9 - 62.9 %   MCV 85.7  78.0 - 100.0 fL   MCH 29.7  26.0 - 34.0 pg   MCHC 34.7  30.0 - 36.0 g/dL   RDW 52.8 (*) 41.3 - 24.4 %   Platelets 133 (*) 150 - 400 K/uL  GLUCOSE, CAPILLARY     Status: Abnormal   Collection Time    06/04/13  8:32 AM      Result Value Range   Glucose-Capillary 108 (*) 70 - 99 mg/dL    No results found.  ROS:  Comprehensive ROS is performed and is found to be negative- denies edema, SOB, nausea   Blood pressure 159/69, pulse 78, temperature 97.4 F (36.3 C), temperature source Oral, resp. rate 24, height 5\' 3"  (1.6 m), weight 70.3 kg (154 lb 15.7 oz), SpO2 99.00%. General appearance: alert, cooperative and no distress Eyes: conjunctivae/corneas clear. PERRL, EOM's intact. Fundi benign. Neck: no adenopathy, no carotid bruit, no JVD, supple, symmetrical, trachea midline and thyroid not enlarged, symmetric, no tenderness/mass/nodules Resp: clear to auscultation bilaterally Cardio: regular rate and rhythm, S1,  S2 normal, no murmur, click, rub or gallop GI: soft, non-tender; bowel sounds normal; no masses,  no organomegaly Extremities: extremities normal, atraumatic, no cyanosis or edema Neurologic: Grossly normal right upper arm AVF wrapped  Assessment/Plan: 79 year old WF with dementia and ESRD- she presents with acute bleed form her AVF which now requires ligation.   1 Spontaneous bleed from AVF- seen by  surgery and will require ligation of AVF and PC placement.  To be done later today by [facility] 2 ESRD: normally MWF at [facility] - last HD appear to be Monday.  There are no acute indications for HD so I think could wait until tomorrow to get next treatment especially since I do not know the timing of the procedures 3 Hypertension: well controlled, apparently no OP meds for HTN 4. Anemia of ESRD: ABL- required transfusion but now hgb in the 8's.  Will increase her home dose of ESA 5. Metabolic Bone Disease: doesn't appear that she is on any binder-continue renavite-  have requested records for HD meds from [facility] 6. Dementia- pleasant at this time- on both aricept and namenda  [name] A 06/04/2013, 11:46 AM

## 2013-06-04 NOTE — Anesthesia Preprocedure Evaluation (Addendum)
Anesthesia Evaluation    Reviewed: Allergy & Precautions, H&P , NPO status , Patient's Chart, lab work & pertinent test results, reviewed documented beta blocker date and time   History of Anesthesia Complications Negative for: history of anesthetic complications  Airway Mallampati: II TM Distance: >3 FB Neck ROM: Full    Dental  (+) Teeth Intact and Poor Dentition   Pulmonary pneumonia -, unresolved,  Pt hospitalized in June of this year at [facility] with pneumonia, was not intubated but tx with antibiotics and nebs with O2.         Cardiovascular negative cardio ROS      Neuro/Psych Dementia negative psych ROS   GI/Hepatic   Endo/Other  negative endocrine ROS  Renal/GU ESRF and DialysisRenal diseaseHemodialysis patient for the last 4 years with existing graft.  Was dialyzed this past Monday, and Monday night the graft burst and pt bled huge amts.  Adm to AP and got 3 units of blood.     Musculoskeletal   Abdominal   Peds  Hematology negative hematology ROS (+)   Anesthesia Other Findings Plaque on teeth. Upper teeth lean out prominently.  Reproductive/Obstetrics                         Anesthesia Physical Anesthesia Plan  ASA: III  Anesthesia Plan: General   Post-op Pain Management:    Induction: Intravenous  Airway Management Planned: LMA  Additional Equipment:   Intra-op Plan:   Post-operative Plan: Extubation in OR  Informed Consent:   Plan Discussed with: CRNA, Anesthesiologist and Surgeon  Anesthesia Plan Comments:         Anesthesia Quick Evaluation

## 2013-06-04 NOTE — Transfer of Care (Signed)
Immediate Anesthesia Transfer of Care Note  Patient: [name]  Procedure(s) Performed: Procedure(s): LIGATION OF ARTERIOVENOUS  FISTULA (Left) INSERTION OF DIALYSIS CATHETER (Right)  Patient Location: PACU  Anesthesia Type:General  Level of Consciousness: awake, alert  and patient cooperative  Airway & Oxygen Therapy: Patient Spontanous Breathing and Patient connected to nasal cannula oxygen  Post-op Assessment: Report given to PACU RN, Post -op Vital signs reviewed and stable and Patient moving all extremities  Post vital signs: Reviewed and stable  Complications: No apparent anesthesia complications

## 2013-06-05 ENCOUNTER — Telehealth: Payer: Self-pay | Admitting: Vascular Surgery

## 2013-06-05 ENCOUNTER — Other Ambulatory Visit: Payer: Self-pay | Admitting: *Deleted

## 2013-06-05 DIAGNOSIS — D696 Thrombocytopenia, unspecified: Secondary | ICD-10-CM

## 2013-06-05 DIAGNOSIS — N186 End stage renal disease: Secondary | ICD-10-CM

## 2013-06-05 DIAGNOSIS — Z01810 Encounter for preprocedural cardiovascular examination: Secondary | ICD-10-CM

## 2013-06-05 LAB — CBC
HCT: 26 % — ABNORMAL LOW (ref 36.0–46.0)
MCHC: 34.2 g/dL (ref 30.0–36.0)
Platelets: 123 10*3/uL — ABNORMAL LOW (ref 150–400)
RDW: 16.4 % — ABNORMAL HIGH (ref 11.5–15.5)
WBC: 7 10*3/uL (ref 4.0–10.5)

## 2013-06-05 MED ORDER — ALTEPLASE 100 MG IV SOLR
5.0000 mg | Freq: Once | INTRAVENOUS | Status: AC
  Administered 2013-06-05: 4.6 mg
  Filled 2013-06-05: qty 5

## 2013-06-05 MED ORDER — NEPRO/CARBSTEADY PO LIQD: 237.0000 mL | Freq: Three times a day (TID) | ORAL | Status: DC | PRN

## 2013-06-05 MED ORDER — FERROUS SULFATE 325 (65 FE) MG PO TABS: 325.0000 mg | ORAL_TABLET | Freq: Two times a day (BID) | ORAL | Status: DC

## 2013-06-05 MED ORDER — DOXERCALCIFEROL 4 MCG/2ML IV SOLN
INTRAVENOUS | Status: AC
  Filled 2013-06-05: qty 2

## 2013-06-05 MED ORDER — DARBEPOETIN ALFA-POLYSORBATE 100 MCG/0.5ML IJ SOLN
100.0000 ug | INTRAMUSCULAR | Status: DC
  Administered 2013-06-05: 100 ug via INTRAVENOUS
  Filled 2013-06-05: qty 0.5

## 2013-06-05 MED ORDER — DARBEPOETIN ALFA-POLYSORBATE 100 MCG/0.5ML IJ SOLN
INTRAMUSCULAR | Status: AC
  Filled 2013-06-05: qty 0.5

## 2013-06-05 NOTE — Progress Notes (Signed)
PT Cancellation Note  Patient Details Name: [name] MRN: [id number] DOB: June 19, 1931   Cancelled Treatment:    Reason Eval/Treat Not Completed: Patient at procedure or test/unavailable HD.     Maida Sale E 06/05/2013, 2:14 PM Zenovia Jarred, PT, DPT 06/05/2013 Pager: [phone]

## 2013-06-05 NOTE — Progress Notes (Signed)
Received patient from OR per hospital bed, sleepy but arousable.  Patient's son, Sherilyn Dacosta, is at bedside.  He is aware that patient will be transferred to medical floor.

## 2013-06-05 NOTE — Progress Notes (Signed)
[facility]  Daily Progress Note  Assessment/Planning: POD #1 s/p Exc. Ulcer over BC AVF, ligation of BC AVF, RIJ TDC   L BC AVF is ligated and remaining segments in the process of thrombosing  Dilated segment is expected as there was a patent VENOUS sidebranch, only solution would have been to excise the entire fistula segment which would have resulted in a considerably long incision.    If the patient decompresses this segment to the skin, I might be forced to excise this segment in the future.  No immediate further intervention needed.    Pt will follow up in the office in two weeks for staple removal and re-evaluation.  Subjective  - 1 Day Post-Op  No complaints  Objective Filed Vitals:   06/04/13 1730 06/04/13 2026 06/05/13 0515 06/05/13 0524  BP: 155/83 141/58 129/70   Pulse: 69 68 105   Temp: 97.4 F (36.3 C) 97.6 F (36.4 C) 98.1 F (36.7 C)   TempSrc:  Oral Oral   Resp: 15 18 18    Height:      Weight:  143 lb 11.8 oz (65.2 kg)    SpO2: 100% 95% 86% 97%    Intake/Output Summary (Last 24 hours) at 06/05/13 0800 Last data filed at 06/05/13 0651  Gross per 24 hour  Intake    720 ml  Output     25 ml  Net    695 ml    PULM  CTAB, TDC in place, no bleeding CV  RRR GI  soft, NTND VASC  LUE: palpable radial, soft dilated fistula segment without any thrill or bruit  Laboratory CBC    Component Value Date/Time   WBC 8.5 06/04/2013 0622   HGB 8.7* 06/04/2013 0622   HCT 25.1* 06/04/2013 0622   PLT 133* 06/04/2013 0622    BMET    Component Value Date/Time   NA 138 06/04/2013 0622   K 3.5 06/04/2013 0622   CL 100 06/04/2013 0622   CO2 24 06/04/2013 0622   GLUCOSE 97 06/04/2013 0622   BUN 67* 06/04/2013 0622   CREATININE 8.21* 06/04/2013 0622   CALCIUM 7.8* 06/04/2013 0622   CALCIUM 7.5* 04/02/2009 0613   GFRNONAA 4* 06/04/2013 0622   GFRAA 5* 06/04/2013 0622    Leonides Sake, MD [facility] [place] Office:  [phone] Pager: [phone]  06/05/2013, 8:00 AM

## 2013-06-05 NOTE — Progress Notes (Signed)
Patient's son, Eliya Bubar, arrived to take patient home via car.  PIV removed.  Discharge instructions discussed in length and all questions answered.  FU appointment with [facility] on 06/19/2013 pointed out.  Discussed hemodialysis catheter care.  Especially stressed not to get the right tunneled catheter wet under any circumstances.  Patient assisted to wheelchair and wheeled by NT to meet son at lobby.  Dawnmarie Breon RN, Justine Null

## 2013-06-05 NOTE — Discharge Summary (Signed)
Physician Discharge Summary  [name] VHQ:[id number] DOB: 1931/07/03 DOA: 06/02/2013  PCP: Fredirick Maudlin, MD  Admit date: 06/02/2013 Discharge date: 06/05/2013  Recommendations for Outpatient Follow-up:  1. Followup with regular scheduled hemodialysis.  Please followup on the results of the HIT panel.   2. Followup with vascular surgery, Dr. Leonides Sake, in 2 weeks for staple removal and reevaluation.  Discharge Diagnoses:  Active Problems:   CKD (chronic kidney disease) stage V requiring chronic dialysis   Senile dementia   Acute blood loss anemia   Thrombocytopenia, unspecified   Discharge Condition: Stable, improved  Diet recommendation: Dialysis  Wt Readings from Last 3 Encounters:  06/04/13 65.2 kg (143 lb 11.8 oz)  06/04/13 65.2 kg (143 lb 11.8 oz)  04/30/13 72.4 kg (159 lb 9.8 oz)    History of present illness:   [name] is a 79 y.o. female who has end-stage renal disease and is on hemodialysis. Unfortunately last night she was found to have active bleeding and this morning was found in a pool of blood, the bleeding was from her graft site. When she arrived to the emergency room, the bleeding spontaneously stopped. She is known to Dr Fausto Skillern, nephrology. She is now being admitted because of significant anemia from her bleeding. The patient herself has really not got any complaints. She does have dementia.    Hospital Course:   Acute blood loss anemia due to hemodialysis access bleeding.  Presentation her hemoglobin was 6.4. She was transfused 2 units of packed red blood cells and her hemoglobin increased appropriately. The bleeding from her access site spontaneously resolved, however, she was seen by vascular surgery Dr. Imogene Burn who performed a ligation of the left brachiocephalic AV fistula and excision of an ulcerated segment.  She had a right internal jugular tunnel dialysis catheter placed on the same day, July 17.    End-stage renal disease, receives  hemodialysis on Monday, Wednesday, and Friday. She received hemodialysis on July 18 through her hemodialysis catheter.  Anemia of renal parenchymal disease. Management per nephrology.  Thrombocytopenia, mild and gradually trended down after admission. This is most likely secondary to acute blood loss.  An HIT panel is pending at the time of discharge, although this is less likely given the fact that her platelets have stabilized.    Hypertension, normal to mildly elevated blood pressures. Currently not on any medications. Defer to outpatient management.  Dementia, patient mildly confused but pleasant. Continued Aricept and Namenda.  Her grandson lives with her and her son and daughter-in-law live next door to her.  Consultants:  Vascular surgery  Nephrology  Procedures:  Ligation AV fistula with excision of ulcerated segment and PermCath placement 7/15  Antibiotics:  Zinacef x1 dose pre-op   Discharge Exam: Filed Vitals:   06/05/13 1330  BP: 141/73  Pulse: 74  Temp:   Resp: 17   Filed Vitals:   06/05/13 0915 06/05/13 1300 06/05/13 1315 06/05/13 1330  BP: 134/58 161/85 153/83 141/73  Pulse: 67 80 77 74  Temp: 97.8 F (36.6 C)     TempSrc:      Resp: 18 17 18 17   Height:      Weight:      SpO2: 100% 100%      General:  Adult female, in no acute distress HEENT: Normocephalic atraumatic, moist mucous membranes Cardiovascular:  Regular rate and rhythm, no murmurs rubs or gallops, normal S1 and S2. Respiratory:  Clear to auscultation bilaterally ABD:  NABS, soft, ND/NT MSK:  Right chest wall with tunnel hemodialysis catheter which appears clean, dry, intact. Right wrist is bandaged because of a leaking peripheral IV. Her left upper extremity has a dressing over her AV fistula which is clean, dry, and intact. There is no bruit or thrill.    Discharge Instructions      Discharge Orders   Future Appointments Provider Department Dept Phone   06/19/2013 3:00 PM [facility] Lab 4  [facility] [phone]   06/19/2013 3:30 PM Fransisco Hertz, MD [facility] [phone]   Future Orders Complete By Expires     Call MD for:  difficulty breathing, headache or visual disturbances  As directed     Call MD for:  extreme fatigue  As directed     Call MD for:  hives  As directed     Call MD for:  persistant dizziness or light-headedness  As directed     Call MD for:  persistant nausea and vomiting  As directed     Call MD for:  redness, tenderness, or signs of infection (pain, swelling, redness, odor or green/yellow discharge around incision site)  As directed     Call MD for:  severe uncontrolled pain  As directed     Call MD for:  temperature >100.4  As directed     Diet - low sodium heart healthy  As directed     Comments:      Renal dialysis.    Discharge instructions  As directed     Comments:      You were hospitalized with bleeding from your AV fistula.  Your fistula was ulcerated and part of it was surgically removed and the remained was ligated (closed off) by Dr. Imogene Burn.  You will receive hemodialysis through your tunneled catheter for the time being.  Please keep the dressing on your catheter, clean, dry, and intact.  It will be reevaluated in dialysis.  Do not submerge in water.  You may remove the bandage on the left fistula in 48 hours.  Please do not submerge the area and keep it clean and dry.  Follow up with Dr. Imogene Burn in 2 weeks to have the staples removed.    Increase activity slowly  As directed         Medication List    STOP taking these medications       FORTAZ IN D5W 1-5 GM/50ML-%  Generic drug:  ceftAZIDime     sodium chloride 0.9 % SOLN 250 mL with vancomycin 1000 MG SOLR 1,000 mg      TAKE these medications       donepezil 10 MG tablet  Commonly known as:  ARICEPT  Take 10 mg by mouth at bedtime.     feeding supplement (NEPRO CARB STEADY) Liqd  Take 237 mLs by mouth 3 (three) times  daily as needed (Supplement).     ferrous sulfate 325 (65 FE) MG tablet  Take 1 tablet (325 mg total) by mouth 2 (two) times daily with a meal.     memantine 10 MG tablet  Commonly known as:  NAMENDA  Take 10 mg by mouth 2 (two) times daily. For memory     multivitamin Tabs tablet  Take 1 tablet by mouth daily.     torsemide 20 MG tablet  Commonly known as:  DEMADEX  Take 20 mg by mouth daily.       Follow-up Information   Follow up with Nilda Simmer, MD.  Schedule an appointment as soon as possible for a visit in 2 weeks.   Contact information:   [address] [place] [address] [phone]        The results of significant diagnostics from this hospitalization (including imaging, microbiology, ancillary and laboratory) are listed below for reference.    Significant Diagnostic Studies: Dg Fluoro Guide Cv Line-no Report  06/04/2013   CLINICAL DATA: End stage renal disease   FLOURO GUIDE CV LINE  Fluoroscopy was utilized by the requesting physician.  No radiographic  interpretation.     Microbiology: Recent Results (from the past 240 hour(s))  MRSA PCR SCREENING     Status: None   Collection Time    06/02/13  1:15 PM      Result Value Range Status   MRSA by PCR NEGATIVE  NEGATIVE Final   Comment:            The GeneXpert MRSA Assay (FDA     approved for NASAL specimens     only), is one component of a     comprehensive MRSA colonization     surveillance program. It is not     intended to diagnose MRSA     infection nor to guide or     monitor treatment for     MRSA infections.     Labs: Basic Metabolic Panel:  Recent Labs Lab 06/02/13 0942 06/03/13 0447 06/04/13 0021 06/04/13 0622  NA 138 139 137 138  K 3.5 3.7 3.5 3.5  CL 99 102 99 100  CO2 26 27 25 24   GLUCOSE 125* 77 101* 97  BUN 49* 56* 63* 67*  CREATININE 5.67* 6.62* 7.59* 8.21*  CALCIUM 7.8* 7.6* 7.7* 7.8*  PHOS  --   --   --  6.6*   Liver Function Tests:  Recent Labs Lab  06/03/13 0447 06/04/13 0622  AST 17  --   ALT 10  --   ALKPHOS 53  --   BILITOT 0.5  --   PROT 5.1*  --   ALBUMIN 2.2* 2.4*   No results found for this basename: LIPASE, AMYLASE,  in the last 168 hours No results found for this basename: AMMONIA,  in the last 168 hours CBC:  Recent Labs Lab 06/02/13 0942 06/03/13 0447 06/03/13 2101 06/04/13 0021 06/04/13 0622 06/05/13 0835  WBC 8.2 6.3 7.7 7.7 8.5 7.0  NEUTROABS 6.7  --   --   --   --   --   HGB 6.4* 8.8* 8.7* 8.6* 8.7* 8.9*  HCT 19.5* 25.8* 24.8* 25.4* 25.1* 26.0*  MCV 92.4 87.8 85.2 85.8 85.7 87.8  PLT 141* 127* 126* 131* 133* 123*   Cardiac Enzymes: No results found for this basename: CKTOTAL, CKMB, CKMBINDEX, TROPONINI,  in the last 168 hours BNP: BNP (last 3 results) No results found for this basename: PROBNP,  in the last 8760 hours CBG:  Recent Labs Lab 06/02/13 0941 06/03/13 1719 06/03/13 2142 06/04/13 0832  GLUCAP 107* 101* 111* 108*    Time coordinating discharge: 45 minutes  Signed:  Zonnique Norkus  [facility] 06/05/2013, 1:59 PM

## 2013-06-05 NOTE — Progress Notes (Signed)
Subjective:  No new complaints- s/p ligation of AVF and PC placement late yesterday- due to get HD today.  No new complaints Objective Vital signs in last 24 hours: Filed Vitals:   06/04/13 2026 06/05/13 0515 06/05/13 0524 06/05/13 0915  BP: 141/58 129/70  134/58  Pulse: 68 105  67  Temp: 97.6 F (36.4 C) 98.1 F (36.7 C)  97.8 F (36.6 C)  TempSrc: Oral Oral    Resp: 18 18  18   Height:      Weight: 65.2 kg (143 lb 11.8 oz)     SpO2: 95% 86% 97% 100%   Weight change: -5.1 kg (-11 lb 3.9 oz)  Intake/Output Summary (Last 24 hours) at 06/05/13 1140 Last data filed at 06/05/13 0916  Gross per 24 hour  Intake    720 ml  Output     25 ml  Net    695 ml   Labs: Basic Metabolic Panel:  Recent Labs Lab 06/03/13 0447 06/04/13 0021 06/04/13 0622  NA 139 137 138  K 3.7 3.5 3.5  CL 102 99 100  CO2 27 25 24   GLUCOSE 77 101* 97  BUN 56* 63* 67*  CREATININE 6.62* 7.59* 8.21*  CALCIUM 7.6* 7.7* 7.8*  PHOS  --   --  6.6*   Liver Function Tests:  Recent Labs Lab 06/03/13 0447 06/04/13 0622  AST 17  --   ALT 10  --   ALKPHOS 53  --   BILITOT 0.5  --   PROT 5.1*  --   ALBUMIN 2.2* 2.4*   No results found for this basename: LIPASE, AMYLASE,  in the last 168 hours No results found for this basename: AMMONIA,  in the last 168 hours CBC:  Recent Labs Lab 06/02/13 0942 06/03/13 0447 06/03/13 2101 06/04/13 0021 06/04/13 0622 06/05/13 0835  WBC 8.2 6.3 7.7 7.7 8.5 7.0  NEUTROABS 6.7  --   --   --   --   --   HGB 6.4* 8.8* 8.7* 8.6* 8.7* 8.9*  HCT 19.5* 25.8* 24.8* 25.4* 25.1* 26.0*  MCV 92.4 87.8 85.2 85.8 85.7 87.8  PLT 141* 127* 126* 131* 133* 123*   Cardiac Enzymes: No results found for this basename: CKTOTAL, CKMB, CKMBINDEX, TROPONINI,  in the last 168 hours CBG:  Recent Labs Lab 06/02/13 0941 06/03/13 1719 06/03/13 2142 06/04/13 0832  GLUCAP 107* 101* 111* 108*    Iron Studies: No results found for this basename: IRON, TIBC, TRANSFERRIN, FERRITIN,   in the last 72 hours Studies/Results: Dg Fluoro Guide Cv Line-no Report  06/04/2013   CLINICAL DATA: End stage renal disease   FLOURO GUIDE CV LINE  Fluoroscopy was utilized by the requesting physician.  No radiographic  interpretation.    Medications: Infusions: . sodium chloride 10 mL/hr (06/04/13 1353)    Scheduled Medications: . donepezil  10 mg Oral QHS  . doxercalciferol  2.5 mcg Intravenous Q M,W,F-HD  . ferrous sulfate  325 mg Oral BID WC  . memantine  10 mg Oral BID  . multivitamin  1 tablet Oral QHS  . sodium chloride  3 mL Intravenous Q12H    have reviewed scheduled and prn medications.  Physical Exam: General: pleasant, NAD Heart: RRR Lungs: mostly clear Abdomen: soft, non tender Extremities: no edema Dialysis Access: new right sided PC - left upper arm with bandage   Assessment/Plan: 79 year old WF with dementia and ESRD- she presents with acute bleed from her AVF which now has required ligation.  1 Spontaneous bleed from AVF- seen by surgery and is s/p ligation of AVF and PC placement.  2 ESRD: normally MWF at [facility] - last HD appear to be Monday. Getting her regular treatment today.  3 Hypertension: well controlled, apparently no OP meds for HTN  4. Anemia of ESRD: ABL- required transfusion but now hgb in the 8's. On no ESA as OP, will add 5. Metabolic Bone Disease: doesn't appear that she is on any binder as OP-continue renavite- have requested records for HD meds from [facility] - on hectorol has been added 6. Dementia- pleasant at this time- on both aricept and namenda 7. Dispo- I imagine could be discharged after HD tonight or in AM if wanting to watch wound a little longer- per primary   Chandelle Harkey A   06/05/2013,11:40 AM  LOS: 3 days

## 2013-06-05 NOTE — Progress Notes (Signed)
Unable to get catheter to run for hemodialysis Tx. Pt rinsed back and Dr. Kathrene Bongo called and informed. Pt is to get activase and finish her Tx. Pt also had goal changed to l for EDW being less then the goal.

## 2013-06-05 NOTE — Telephone Encounter (Addendum)
Message copied by Rosalyn Charters on Fri Jun 05, 2013 10:20 AM ------      Message from: Melene Plan      Created: Fri Jun 05, 2013  9:53 AM                   ----- Message -----         From: Fransisco Hertz, MD         Sent: 06/05/2013   8:05 AM           To: Melene Plan, RN            SHAKEMA SURITA      [id number]      1931-07-31            Follow up in two weeks for staple removal and R arm vein mapping. ------  notified patient of fu appt. on 06-19-13 at 3PM with dr. Imogene Burn

## 2013-06-08 ENCOUNTER — Encounter (HOSPITAL_COMMUNITY): Payer: Self-pay | Admitting: Vascular Surgery

## 2013-06-08 ENCOUNTER — Inpatient Hospital Stay (HOSPITAL_COMMUNITY)
Admission: EM | Admit: 2013-06-08 | Discharge: 2013-06-12 | DRG: 193 | Disposition: A | Discharge status: Home Health | Payer: Medicare HMO | Attending: Pulmonary Disease | Admitting: Pulmonary Disease

## 2013-06-08 ENCOUNTER — Emergency Department (HOSPITAL_COMMUNITY): Payer: Medicare HMO

## 2013-06-08 DIAGNOSIS — F0391 Unspecified dementia with behavioral disturbance: Secondary | ICD-10-CM

## 2013-06-08 DIAGNOSIS — J189 Pneumonia, unspecified organism: Secondary | ICD-10-CM

## 2013-06-08 DIAGNOSIS — Z992 Dependence on renal dialysis: Secondary | ICD-10-CM

## 2013-06-08 DIAGNOSIS — F0390 Unspecified dementia without behavioral disturbance: Secondary | ICD-10-CM

## 2013-06-08 DIAGNOSIS — F03918 Unspecified dementia, unspecified severity, with other behavioral disturbance: Secondary | ICD-10-CM

## 2013-06-08 DIAGNOSIS — I120 Hypertensive chronic kidney disease with stage 5 chronic kidney disease or end stage renal disease: Secondary | ICD-10-CM | POA: Diagnosis present

## 2013-06-08 DIAGNOSIS — E876 Hypokalemia: Secondary | ICD-10-CM | POA: Diagnosis present

## 2013-06-08 DIAGNOSIS — N186 End stage renal disease: Secondary | ICD-10-CM

## 2013-06-08 DIAGNOSIS — D500 Iron deficiency anemia secondary to blood loss (chronic): Secondary | ICD-10-CM | POA: Diagnosis present

## 2013-06-08 DIAGNOSIS — Z8701 Personal history of pneumonia (recurrent): Secondary | ICD-10-CM

## 2013-06-08 DIAGNOSIS — R600 Localized edema: Secondary | ICD-10-CM

## 2013-06-08 DIAGNOSIS — E8770 Fluid overload, unspecified: Secondary | ICD-10-CM | POA: Diagnosis present

## 2013-06-08 DIAGNOSIS — J90 Pleural effusion, not elsewhere classified: Secondary | ICD-10-CM

## 2013-06-08 DIAGNOSIS — N2581 Secondary hyperparathyroidism of renal origin: Secondary | ICD-10-CM | POA: Diagnosis present

## 2013-06-08 LAB — CBC WITH DIFFERENTIAL/PLATELET
Eosinophils Absolute: 0.1 10*3/uL (ref 0.0–0.7)
Eosinophils Relative: 1 % (ref 0–5)
HCT: 26.3 % — ABNORMAL LOW (ref 36.0–46.0)
Lymphocytes Relative: 20 % (ref 12–46)
Lymphs Abs: 1.9 10*3/uL (ref 0.7–4.0)
MCH: 30.5 pg (ref 26.0–34.0)
MCV: 92.3 fL (ref 78.0–100.0)
Monocytes Absolute: 0.8 10*3/uL (ref 0.1–1.0)
Monocytes Relative: 9 % (ref 3–12)
Platelets: 153 10*3/uL (ref 150–400)
RBC: 2.85 MIL/uL — ABNORMAL LOW (ref 3.87–5.11)
WBC: 9.5 10*3/uL (ref 4.0–10.5)

## 2013-06-08 LAB — COMPREHENSIVE METABOLIC PANEL
ALT: 7 U/L (ref 0–35)
BUN: 25 mg/dL — ABNORMAL HIGH (ref 6–23)
CO2: 28 mEq/L (ref 19–32)
Calcium: 8.3 mg/dL — ABNORMAL LOW (ref 8.4–10.5)
Creatinine, Ser: 4.41 mg/dL — ABNORMAL HIGH (ref 0.50–1.10)
GFR calc Af Amer: 10 mL/min — ABNORMAL LOW (ref >90)
GFR calc non Af Amer: 9 mL/min — ABNORMAL LOW (ref >90)
Glucose, Bld: 96 mg/dL (ref 70–99)
Total Protein: 6.5 g/dL (ref 6.0–8.3)

## 2013-06-08 LAB — URINE MICROSCOPIC-ADD ON

## 2013-06-08 LAB — URINALYSIS, ROUTINE W REFLEX MICROSCOPIC
Leukocytes, UA: NEGATIVE
Nitrite: NEGATIVE
Specific Gravity, Urine: 1.02 (ref 1.005–1.030)
Urobilinogen, UA: 0.2 mg/dL (ref 0.0–1.0)
pH: 6.5 (ref 5.0–8.0)

## 2013-06-08 LAB — LACTIC ACID, PLASMA: Lactic Acid, Venous: 1 mmol/L (ref 0.5–2.2)

## 2013-06-08 LAB — HEPARIN INDUCED THROMBOCYTOPENIA PNL: UFH Low Dose 0.1 IU/mL: 0 % Release

## 2013-06-08 MED ORDER — SODIUM CHLORIDE 0.9 % IV BOLUS (SEPSIS)
500.0000 mL | Freq: Once | INTRAVENOUS | Status: AC
  Administered 2013-06-08: 500 mL via INTRAVENOUS

## 2013-06-08 MED ORDER — DEXTROSE 5 % IV SOLN
1.0000 g | Freq: Once | INTRAVENOUS | Status: DC
  Administered 2013-06-09: 1 g via INTRAVENOUS
  Filled 2013-06-08: qty 1

## 2013-06-08 NOTE — ED Notes (Signed)
Pt c/o fever which she first noticed tonight. Pt had dialysis earlier today. Pt denies any pain or other discomforts. Pt had procedure to repair fistula last Monday.

## 2013-06-08 NOTE — ED Notes (Signed)
Pt states she is ready to go, pt informed we are still waiting on her test results. Pt agrees to stay for a little while longer.

## 2013-06-08 NOTE — ED Provider Notes (Signed)
History    CSN: [id number] Arrival date & time 06/08/13  2049  First MD Initiated Contact with Patient 06/08/13 2200     Chief Complaint  Patient presents with  . Fever   (Consider location/radiation/quality/duration/timing/severity/associated sxs/prior Treatment) HPI This 79 year old female has a history of dementia and chronic renal failure on dialysis, she was recently hospitalized for spontaneous rupture with bleeding of her left arm fistula ligation of her fistula by vascular surgery performed, she has a temporary dialysis catheter placed in her right internal jugular vein, she received transfusions for anemia during her hospital admission, she was discharged and returns tonight for fever this evening 100.4 orally, there are no other complaints or concerns, there is been no cough no shortness of breath no vomiting no diarrhea, she does still make urine, there's been no rash, there's been no redness or drainage around her wounds. She is pleasantly demented tonight at her baseline with no altered mental status for her tonight. There is no treatment prior to arrival. She has no headache stiff neck chest pain abdominal pain or other concerns herself. Past Medical History  Diagnosis Date  . Renal disorder    Past Surgical History  Procedure Laterality Date  . Dialysis fistula creation    . Cholecystectomy    . Ligation of arteriovenous  fistula Left 06/04/2013    Procedure: LIGATION OF ARTERIOVENOUS  FISTULA;  Surgeon: Fransisco Hertz, MD;  Location: [facility] OR;  Service: Vascular;  Laterality: Left;  . Insertion of dialysis catheter Right 06/04/2013    Procedure: INSERTION OF DIALYSIS CATHETER;  Surgeon: Fransisco Hertz, MD;  Location: [facility] OR;  Service: Vascular;  Laterality: Right;   History reviewed. No pertinent family history. History  Substance Use Topics  . Smoking status: Never Smoker   . Smokeless tobacco: Not on file  . Alcohol Use: No   OB History   Grav Para Term Preterm Abortions  TAB SAB Ect Mult Living                 Review of Systems  Unable to perform ROS: Dementia    Allergies  Sulfa antibiotics and Sulfur  Home Medications   No current outpatient prescriptions on file. BP 123/64  Pulse 77  Temp(Src) 98 F (36.7 C) (Oral)  Resp 18  Ht 5\' 3"  (1.6 m)  Wt 147 lb 0.8 oz (66.7 kg)  BMI 26.05 kg/m2  SpO2 97% Physical Exam  Nursing note and vitals reviewed. Constitutional:  Awake, alert, nontoxic appearance.  HENT:  Head: Atraumatic.  Eyes: Right eye exhibits no discharge. Left eye exhibits no discharge.  Neck: Neck supple.  Cardiovascular: Normal rate and regular rhythm.   No murmur heard. Pulmonary/Chest: Effort normal. No respiratory distress. She has no wheezes. She has rales. She exhibits no tenderness.  Crackles posteriorly along the entire right side with left posterior lung field clear except for some basilar crackles; she appears mildly tachypneic without respiratory distress with pulse oximetry normal on room at 96%  Abdominal: Soft. There is no tenderness. There is no rebound.  Musculoskeletal: She exhibits edema. She exhibits no tenderness.  Baseline ROM, no obvious new focal weakness. Baseline mild edema to both feet and lower legs without cellulitis noted.  Her right internal jugular line insertion site has no erythema or purulent drainage at the insertion site. Her left upper arm 2 wound incisions from her surgery are clean and dry without erythema dehiscence or purulent drainage with staples intact.  Neurological: She is alert.  Mental status and motor strength appears baseline for patient and situation. She is awake alert calm pleasant and follow simple commands with baseline confusion baseline dementia at this time according to family.  Skin: No rash noted.  Psychiatric: She has a normal mood and affect.    ED Course  Procedures (including critical care time) Patient / Family / Caregiver understand and agree with initial ED  impression and plan with expectations set for ED visit. D/w [facility] for admit. Labs Reviewed  CBC WITH DIFFERENTIAL - Abnormal; Notable for the following:    RBC 2.85 (*)    Hemoglobin 8.7 (*)    HCT 26.3 (*)    RDW 16.7 (*)    All other components within normal limits  COMPREHENSIVE METABOLIC PANEL - Abnormal; Notable for the following:    Potassium 3.4 (*)    BUN 25 (*)    Creatinine, Ser 4.41 (*)    Calcium 8.3 (*)    Albumin 2.6 (*)    GFR calc non Af Amer 9 (*)    GFR calc Af Amer 10 (*)    All other components within normal limits  URINALYSIS, ROUTINE W REFLEX MICROSCOPIC - Abnormal; Notable for the following:    APPearance CLOUDY (*)    Hgb urine dipstick LARGE (*)    Bilirubin Urine MODERATE (*)    Ketones, ur TRACE (*)    Protein, ur >300 (*)    All other components within normal limits  URINE MICROSCOPIC-ADD ON - Abnormal; Notable for the following:    Squamous Epithelial / LPF MANY (*)    Bacteria, UA MANY (*)    All other components within normal limits  COMPREHENSIVE METABOLIC PANEL - Abnormal; Notable for the following:    Potassium 3.4 (*)    BUN 27 (*)    Creatinine, Ser 4.84 (*)    Calcium 8.1 (*)    Albumin 2.3 (*)    GFR calc non Af Amer 8 (*)    GFR calc Af Amer 9 (*)    All other components within normal limits  CBC - Abnormal; Notable for the following:    RBC 2.71 (*)    Hemoglobin 8.3 (*)    HCT 25.1 (*)    RDW 17.1 (*)    All other components within normal limits  BODY FLUID CELL COUNT WITH DIFFERENTIAL - Abnormal; Notable for the following:    Appearance, Fluid HAZY (*)    Neutrophil Count, Fluid 32 (*)    Monocyte-Macrophage-Serous Fluid 16 (*)    All other components within normal limits  LACTATE DEHYDROGENASE, BODY FLUID - Abnormal; Notable for the following:    LD, Fluid 72 (*)    All other components within normal limits  IRON AND TIBC - Abnormal; Notable for the following:    Iron 19 (*)    TIBC 172 (*)    Saturation Ratios 11 (*)     All other components within normal limits  FERRITIN - Abnormal; Notable for the following:    Ferritin 531 (*)    All other components within normal limits  RETICULOCYTES - Abnormal; Notable for the following:    Retic Ct Pct 3.3 (*)    RBC. 2.72 (*)    All other components within normal limits  PHOSPHORUS - Abnormal; Notable for the following:    Phosphorus 6.4 (*)    All other components within normal limits  BASIC METABOLIC PANEL - Abnormal; Notable for the following:    BUN 37 (*)  Creatinine, Ser 6.40 (*)    Calcium 8.0 (*)    GFR calc non Af Amer 5 (*)    GFR calc Af Amer 6 (*)    All other components within normal limits  CULTURE, BLOOD (ROUTINE X 2)  CULTURE, BLOOD (ROUTINE X 2)  URINE CULTURE  BODY FLUID CULTURE  LACTIC ACID, PLASMA  LEGIONELLA ANTIGEN, URINE  STREP PNEUMONIAE URINARY ANTIGEN  GLUCOSE, SEROUS FLUID  PROTEIN, BODY FLUID  AMYLASE, BODY FLUID  PH, BODY FLUID  VITAMIN B12  FOLATE  PATHOLOGIST SMEAR REVIEW  PTH, INTACT AND CALCIUM  CYTOLOGY - NON PAP   Dg Chest 1 View  06/09/2013   *RADIOLOGY REPORT*  Clinical Data: Post thoracentesis  CHEST - 1 VIEW  Comparison: Expiratory PA chest radiograph compared to 06/08/2013  Findings: Right jugular line tips project over right atrium. Enlargement of cardiac silhouette with pulmonary vascular congestion. Calcified tortuous aorta. Diffuse bilateral infiltrates compatible with pulmonary edema. Bibasilar effusions left greater than right, decreased on left since previous exam. No pneumothorax. Stent identified left subclavian vein.  IMPRESSION: No pneumothorax following left thoracentesis.   Original Report Authenticated By: Ulyses Southward, M.D.   Dg Chest 2 View  06/08/2013   *RADIOLOGY REPORT*  Clinical Data: Fever.  Dialysis patient.  CHEST - 2 VIEW  Comparison: One-view chest 04/27/2013.  Findings: A right IJ dialysis catheter extends into the right atrium, nearly to the inferior cavoatrial junction.  The heart is  enlarged.  A large left pleural effusion has reaccumulated.  Mild interstitial edema is present.  Left basilar airspace disease is associated with the effusion.  Minimal right basilar atelectasis is present.  IMPRESSION:  1.  Bridging callus catheter with the tip in the and potential be through the right atrium. 2.  Reaccumulation of left pleural effusion with associated airspace disease.  While this may represent atelectasis, infection is not excluded. 3.  Mild diffuse edema is present.   Original Report Authenticated By: Marin Roberts, M.D.   US Venous Img Lower Bilateral  06/09/2013   *RADIOLOGY REPORT*  Clinical Data: leg swelling;;  BILATERAL LOWER EXTREMITY VENOUS DUPLEX ULTRASOUND  Technique: Gray-scale sonography with compression, as well as color and duplex ultrasound, were performed to evaluate the deep venous system from the level of the common femoral vein through the popliteal and proximal calf veins.  Comparison: None  Findings:  Normal compressibility and normal Doppler signal within the common femoral, superficial femoral and popliteal veins, down to the proximal calf veins.  No grayscale filling defects to suggest DVT.  IMPRESSION: No evidence of bilateral lower extremity deep vein thrombosis.   Original Report Authenticated By: Charlett Nose, M.D.   US Thoracentesis Asp Pleural Space W/img Guide  06/09/2013   *RADIOLOGY REPORT*  CLINICAL DATA: RECURRENT LEFT PLEURAL EFFUSION  ULTRASOUND GUIDED LEFT THORACENTESIS:  Technique: After explanation of procedure and risks, written informed consent for thoracentesis obtained. Time out protocol followed. Left pleural effusion localized by ultrasound. Site at posterior leftchest prepped and draped in usual sterile fashion. Skin and chest wall anesthetized with 5 ml of 1% lidocaine. Standard 8-French thoracentesis catheter placed into left pleural space. 780 ml of dirty yellow fluid aspirated by syringe pump. Procedure tolerated well by patient  without immediate complication.  IMPRESSION: Ultrasound-guided left thoracentesis with removal of 780 ml of pleural fluid. Fluid sent to laboratory for requested analysis.   Original Report Authenticated By: Ulyses Southward, M.D.   1. HCAP (healthcare-associated pneumonia)   2. Recurrent left pleural effusion  3. Dementia, without behavioral disturbance   4. CKD (chronic kidney disease) stage V requiring chronic dialysis   5. ESRD (end stage renal disease) on dialysis   6. Pedal edema     MDM  The patient appears reasonably stabilized for admission considering the current resources, flow, and capabilities available in the ED at this time, and I doubt any other [facility] requiring further screening and/or treatment in the ED prior to admission.  Hurman Horn, MD 06/10/13 [phone]

## 2013-06-08 NOTE — ED Notes (Addendum)
Pt had dialysis today & started running a fever after that, pt has not taken any medication. Family states pt feet also swollen. Pt was here on Tuesday & sent to [facility] for a fistula rupture, pt discharged on Friday.

## 2013-06-09 ENCOUNTER — Inpatient Hospital Stay (HOSPITAL_COMMUNITY): Payer: Medicare HMO

## 2013-06-09 ENCOUNTER — Encounter (HOSPITAL_COMMUNITY): Payer: Self-pay | Admitting: Internal Medicine

## 2013-06-09 DIAGNOSIS — R609 Edema, unspecified: Secondary | ICD-10-CM

## 2013-06-09 DIAGNOSIS — F0390 Unspecified dementia without behavioral disturbance: Secondary | ICD-10-CM

## 2013-06-09 DIAGNOSIS — J189 Pneumonia, unspecified organism: Secondary | ICD-10-CM | POA: Diagnosis present

## 2013-06-09 DIAGNOSIS — N186 End stage renal disease: Secondary | ICD-10-CM | POA: Diagnosis present

## 2013-06-09 LAB — COMPREHENSIVE METABOLIC PANEL
ALT: 6 U/L (ref 0–35)
CO2: 29 mEq/L (ref 19–32)
Calcium: 8.1 mg/dL — ABNORMAL LOW (ref 8.4–10.5)
Creatinine, Ser: 4.84 mg/dL — ABNORMAL HIGH (ref 0.50–1.10)
GFR calc Af Amer: 9 mL/min — ABNORMAL LOW (ref >90)
GFR calc non Af Amer: 8 mL/min — ABNORMAL LOW (ref >90)
Glucose, Bld: 93 mg/dL (ref 70–99)

## 2013-06-09 LAB — PROTEIN, BODY FLUID: Total protein, fluid: 2.6 g/dL

## 2013-06-09 LAB — RETICULOCYTES
RBC.: 2.72 MIL/uL — ABNORMAL LOW (ref 3.87–5.11)
Retic Count, Absolute: 89.8 10*3/uL (ref 19.0–186.0)
Retic Ct Pct: 3.3 % — ABNORMAL HIGH (ref 0.4–3.1)

## 2013-06-09 LAB — FOLATE: Folate: >20 ng/mL

## 2013-06-09 LAB — IRON AND TIBC
Iron: 19 ug/dL — ABNORMAL LOW (ref 42–135)
Saturation Ratios: 11 % — ABNORMAL LOW (ref 20–55)
TIBC: 172 ug/dL — ABNORMAL LOW (ref 250–470)
UIBC: 153 ug/dL (ref 125–400)

## 2013-06-09 LAB — BODY FLUID CELL COUNT WITH DIFFERENTIAL: Neutrophil Count, Fluid: 32 % — ABNORMAL HIGH (ref 0–25)

## 2013-06-09 LAB — GLUCOSE, SEROUS FLUID: Glucose, Fluid: 91 mg/dL

## 2013-06-09 LAB — VITAMIN B12: Vitamin B-12: 532 pg/mL (ref 211–911)

## 2013-06-09 LAB — FERRITIN: Ferritin: 531 ng/mL — ABNORMAL HIGH (ref 10–291)

## 2013-06-09 LAB — CBC
Hemoglobin: 8.3 g/dL — ABNORMAL LOW (ref 12.0–15.0)
MCHC: 33.1 g/dL (ref 30.0–36.0)
Platelets: 157 10*3/uL (ref 150–400)
RBC: 2.71 MIL/uL — ABNORMAL LOW (ref 3.87–5.11)

## 2013-06-09 LAB — LACTATE DEHYDROGENASE, PLEURAL OR PERITONEAL FLUID: LD, Fluid: 72 U/L — ABNORMAL HIGH (ref 3–23)

## 2013-06-09 MED ORDER — BOOST / RESOURCE BREEZE PO LIQD
1.0000 | Freq: Three times a day (TID) | ORAL | Status: DC
  Administered 2013-06-09 – 2013-06-12 (×8): 1 via ORAL

## 2013-06-09 MED ORDER — VANCOMYCIN HCL IN DEXTROSE 750-5 MG/150ML-% IV SOLN
750.0000 mg | INTRAVENOUS | Status: DC
  Administered 2013-06-10: 750 mg via INTRAVENOUS
  Filled 2013-06-09 (×3): qty 150

## 2013-06-09 MED ORDER — HEPARIN SODIUM (PORCINE) 5000 UNIT/ML IJ SOLN
5000.0000 [IU] | Freq: Three times a day (TID) | INTRAMUSCULAR | Status: DC
  Administered 2013-06-09 – 2013-06-12 (×9): 5000 [IU] via SUBCUTANEOUS
  Filled 2013-06-09 (×9): qty 1

## 2013-06-09 MED ORDER — NEPHRO-VITE 0.8 MG PO TABS
1.0000 | ORAL_TABLET | Freq: Every day | ORAL | Status: DC
  Administered 2013-06-09 – 2013-06-12 (×4): 1 via ORAL
  Filled 2013-06-09 (×11): qty 1

## 2013-06-09 MED ORDER — DEXTROSE 5 % IV SOLN
2.0000 g | INTRAVENOUS | Status: DC
  Administered 2013-06-10: 2 g via INTRAVENOUS
  Filled 2013-06-09 (×4): qty 2

## 2013-06-09 MED ORDER — ONDANSETRON HCL 4 MG/2ML IJ SOLN: 4.0000 mg | Freq: Four times a day (QID) | INTRAMUSCULAR | Status: DC | PRN

## 2013-06-09 MED ORDER — NEPRO/CARBSTEADY PO LIQD
237.0000 mL | Freq: Three times a day (TID) | ORAL | Status: DC
  Administered 2013-06-09 – 2013-06-12 (×7): 237 mL via ORAL

## 2013-06-09 MED ORDER — MEMANTINE HCL 10 MG PO TABS
10.0000 mg | ORAL_TABLET | Freq: Two times a day (BID) | ORAL | Status: DC
  Administered 2013-06-09 – 2013-06-12 (×7): 10 mg via ORAL
  Filled 2013-06-09 (×7): qty 1

## 2013-06-09 MED ORDER — VANCOMYCIN HCL IN DEXTROSE 1-5 GM/200ML-% IV SOLN
1000.0000 mg | Freq: Once | INTRAVENOUS | Status: AC
  Administered 2013-06-09: 1000 mg via INTRAVENOUS
  Filled 2013-06-09: qty 200

## 2013-06-09 MED ORDER — SODIUM CHLORIDE 0.9 % IJ SOLN
3.0000 mL | Freq: Two times a day (BID) | INTRAMUSCULAR | Status: DC
  Administered 2013-06-09 (×2): 3 mL via INTRAVENOUS

## 2013-06-09 MED ORDER — ALBUTEROL SULFATE (5 MG/ML) 0.5% IN NEBU: 2.5000 mg | INHALATION_SOLUTION | RESPIRATORY_TRACT | Status: DC | PRN

## 2013-06-09 MED ORDER — SODIUM CHLORIDE 0.9 % IV SOLN: 250.0000 mL | INTRAVENOUS | Status: DC | PRN

## 2013-06-09 MED ORDER — ONDANSETRON HCL 4 MG PO TABS: 4.0000 mg | ORAL_TABLET | Freq: Four times a day (QID) | ORAL | Status: DC | PRN

## 2013-06-09 MED ORDER — ACETAMINOPHEN 325 MG PO TABS: 650.0000 mg | ORAL_TABLET | Freq: Four times a day (QID) | ORAL | Status: DC | PRN

## 2013-06-09 MED ORDER — ACETAMINOPHEN 650 MG RE SUPP: 650.0000 mg | Freq: Four times a day (QID) | RECTAL | Status: DC | PRN

## 2013-06-09 MED ORDER — DEXTROSE 5 % IV SOLN
INTRAVENOUS | Status: AC
  Filled 2013-06-09: qty 1

## 2013-06-09 MED ORDER — FERROUS SULFATE 325 (65 FE) MG PO TABS
325.0000 mg | ORAL_TABLET | Freq: Two times a day (BID) | ORAL | Status: DC
  Administered 2013-06-09 – 2013-06-10 (×4): 325 mg via ORAL
  Filled 2013-06-09 (×4): qty 1

## 2013-06-09 MED ORDER — DONEPEZIL HCL 5 MG PO TABS
10.0000 mg | ORAL_TABLET | Freq: Every day | ORAL | Status: DC
  Administered 2013-06-09 – 2013-06-11 (×3): 10 mg via ORAL
  Filled 2013-06-09 (×3): qty 2

## 2013-06-09 MED ORDER — ALBUTEROL SULFATE (5 MG/ML) 0.5% IN NEBU
2.5000 mg | INHALATION_SOLUTION | Freq: Four times a day (QID) | RESPIRATORY_TRACT | Status: DC
  Administered 2013-06-09 – 2013-06-12 (×9): 2.5 mg via RESPIRATORY_TRACT
  Filled 2013-06-09 (×9): qty 0.5

## 2013-06-09 MED ORDER — SODIUM CHLORIDE 0.9 % IJ SOLN: 3.0000 mL | INTRAMUSCULAR | Status: DC | PRN

## 2013-06-09 NOTE — Progress Notes (Signed)
Pt for left side thoracentesis with Dr.Boles. Lidocaine 5ml given by MD.

## 2013-06-09 NOTE — Consult Note (Signed)
[name] MRN: [id number] DOB/AGE: 1931/06/12 79 y.o. Primary Care Physician:HAWKINS,EDWARD L, MD Admit date: 06/08/2013 Chief Complaint:  Chief Complaint  Patient presents with  . Fever   HPI: Pt is 79 year female with past medical history of ESRD who was brought to ER with chief complaint of Bleeding.   HPI dates back to 7/15 when pt family member noticed that she has been bleeding through her access nd she was brought to ER. In ER pt was noticed to have severe anemia with hgb of 6.4 and  received 3 units of PRBC . Pt was transferred to [facility] where her AVf was ligated and PC was placed. Pt was d/ced from there on 06/05/13. Pt came to  ER on 7/21 with c/o fever.  In Er pt had CXR which showed Pneum,oni and pt was admitted for further care. Pt herself does not offer any complaints  NO c/o chest pain  No c/o dyspnea.  No c/o fever/ cough/ chills No c/o Abdominal pain      Past Medical History  Diagnosis Date  . Renal disorder   Past Surgical hx S/p AVf ligation S/P PCplacement       History reviewed. No pertinent family history.NO family hx of ESRD  Social History:  reports that she has never smoked. She does not have any smokeless tobacco history on file. She reports that she does not drink alcohol or use illicit drugs.   Allergies:  Allergies  Allergen Reactions  . Sulfa Antibiotics Other (See Comments)    unknown  . Sulfur     Medications Prior to Admission  Medication Sig Dispense Refill  . donepezil (ARICEPT) 10 MG tablet Take 10 mg by mouth at bedtime.      . memantine (NAMENDA) 10 MG tablet Take 10 mg by mouth 2 (two) times daily. For memory      . multivitamin (RENA-VIT) TABS tablet Take 1 tablet by mouth daily.      [name] torsemide (DEMADEX) 20 MG tablet Take 20 mg by mouth daily.      . ferrous sulfate 325 (65 FE) MG tablet Take 1 tablet (325 mg total) by mouth 2 (two) times daily with a meal.  60 tablet  0  . Nutritional Supplements  (FEEDING SUPPLEMENT, NEPRO CARB STEADY,) LIQD Take 237 mLs by mouth 3 (three) times daily as needed (Supplement).    0       ZHY:QMVHQ from the symptoms mentioned above,there are no other symptoms referable to all systems reviewed.  [name] albuterol  2.5 mg Nebulization Q6H  . donepezil  10 mg Oral QHS  . feeding supplement (NEPRO CARB STEADY)  237 mL Oral TID BM  . ferrous sulfate  325 mg Oral BID WC  . heparin  5,000 Units Subcutaneous Q8H  . memantine  10 mg Oral BID  . multivitamin  1 tablet Oral Daily  . sodium chloride  3 mL Intravenous Q12H     Physical Exam: Vital signs in last 24 hours: Temp:  [98.3 F (36.8 C)-99.9 F (37.7 C)] 98.3 F (36.8 C) (07/22 0655) Pulse Rate:  [85-96] 85 (07/22 0655) Resp:  [20-24] 20 (07/22 0655) BP: (136-149)/(72-81) 146/72 mmHg (07/22 0655) SpO2:  [92 %-98 %] 98 % (07/22 0655) Weight:  [152 lb 1.9 oz (69 kg)-155 lb (70.308 kg)] 152 lb 1.9 oz (69 kg) (07/22 0237) Weight change:  Last BM Date: 06/08/13  Intake/Output from previous day: 07/21 0701 - 07/22 0700 In: 500 [I.V.:500] Out: -  Physical Exam: General- pt is awake,alert, oriented to time place and person Resp- No acute REsp distress,decreased BS at bases. CVS- S1S2 regular in rate and rhythm GIT- BS+, soft, NT, ND EXT- NO LE Edema, Cyanosis CNS- CN 2-12 grossly intact. Moving all 4 extremities Psych- normal mod and affect Access- PC .  Lab Results: CBC  Recent Labs  06/08/13 2241 06/09/13 0558  WBC 9.5 8.0  HGB 8.7* 8.3*  HCT 26.3* 25.1*  PLT 153 157    BMET  Recent Labs  06/08/13 2241 06/09/13 0558  NA 136 138  K 3.4* 3.4*  CL 97 100  CO2 28 29  GLUCOSE 96 93  BUN 25* 27*  CREATININE 4.41* 4.84*  CALCIUM 8.3* 8.1*    MICRO Recent Results (from the past 240 hour(s))  MRSA PCR SCREENING     Status: None   Collection Time    06/02/13  1:15 PM      Result Value Range Status   MRSA by PCR NEGATIVE  NEGATIVE Final   Comment:            The  GeneXpert MRSA Assay (FDA     approved for NASAL specimens     only), is one component of a     comprehensive MRSA colonization     surveillance program. It is not     intended to diagnose MRSA     infection nor to guide or     monitor treatment for     MRSA infections.  CULTURE, BLOOD (ROUTINE X 2)     Status: None   Collection Time    06/08/13 10:41 PM      Result Value Range Status   Specimen Description BLOOD RIGHT ANTECUBITAL   Final   Special Requests BOTTLES DRAWN AEROBIC AND ANAEROBIC [facility] EACH   Final   Culture PENDING   Incomplete   Report Status PENDING   Incomplete  CULTURE, BLOOD (ROUTINE X 2)     Status: None   Collection Time    06/08/13 10:41 PM      Result Value Range Status   Specimen Description BLOOD RIGHT HAND   Final   Special Requests BOTTLES DRAWN AEROBIC AND ANAEROBIC 5CC EACH   Final   Culture PENDING   Incomplete   Report Status PENDING   Incomplete      Lab Results  Component Value Date   PTH 92.3* 04/02/2009   CALCIUM 8.1* 06/09/2013   PHOS 6.6* 06/04/2013      Impression: 1)Renal  ESRD on HD  On MWF schedule    2)HTN  BP at goal   3)Anemia secondary to recent  Bleed + Chronic disease.  Will keep on EPO   4)CKD Mineral-Bone Disorder  PTH over suppressed  Secondary Hyperparathyroidism Present.  Phosphorus at goal.   5)CNS- Dementia stable   6)FEN  Hypokalemic   NOrmonatremic   7)Acid base  Co2 at goal   8) ID- admitted with Pneumonia On ABX  9) Resp- Pleural effusion To have tap   Plan:  Will repeat PTH Will dialyze in am willuse 3 k bath or 4 if k is lower Will keep on epo Will check anemia profile       Jamyia Fortune S 06/09/2013, 7:16 AM

## 2013-06-09 NOTE — Progress Notes (Signed)
Thoracentesis complete clear brown fluid removed. Pt shows no signs of distress.

## 2013-06-09 NOTE — Progress Notes (Signed)
PT Cancellation Note  Patient Details Name: [name] MRN: [id number] DOB: Jun 16, 1931   Cancelled Treatment:    Reason Eval/Treat Not Completed: Patient's level of consciousness Unable to awaken pt for PT eval.  Myrlene Broker L 06/09/2013, 11:17 AM

## 2013-06-09 NOTE — Progress Notes (Signed)
[name], [name]                 ACCOUNT NO.:  [id number]  MEDICAL RECORD NO.:  [id number]  LOCATION:  A337                          FACILITY:  [facility]  PHYSICIAN:  Mammie Meras L. Juanetta Gosling, M.D.DATE OF BIRTH:  1931/07/20  DATE OF PROCEDURE: DATE OF DISCHARGE:                                PROGRESS NOTE   REASON FOR ADMISSION:  Healthcare-associated pneumonia.  SUBJECTIVE:  [name] came to the hospital with shortness of breath and was found to have healthcare-associated pneumonia.  She has had a very difficult recent course with multiple episodes of hospitalization once for pneumonia, once for bleeding from her graft site.  She has been to rehabilitation, but continues to have difficulty.  I think she is probably aspirating.  PHYSICAL EXAMINATION:  GENERAL:  Her physical exam this morning shows that she is awake and alert, but she is sometimes sleepy. VITAL SIGNS:  Her temperature is 98.3; pulse 85; respirations 20; blood pressure 146/72; O2 saturation 94%.  Her white count is 8000, hemoglobin is 8.3.  Potassium is 3.4, BUN 27, and creatinine 4.84.  ASSESSMENT:  She has healthcare-associated pneumonia.  She has chronic renal failure on dialysis.  She had bleeding from her graft site which has resolved now.  She has not done well in general and she is become increasingly weak.  She is set for thoracentesis today.  I am going to ask for Physical Therapy consultation.  Continue with her medications. She has been seen by the nephrologist.     [name]. Juanetta Gosling, M.D.     ELH/MEDQ  D:  06/09/2013  T:  06/09/2013  Job:  [id number]

## 2013-06-09 NOTE — Progress Notes (Signed)
Utilization Review Complete  

## 2013-06-09 NOTE — Progress Notes (Signed)
ANTIBIOTIC CONSULT NOTE - INITIAL  Pharmacy Consult for vancomycin  Indication: suspected pneumonia in ESRD patient  Allergies  Allergen Reactions  . Sulfa Antibiotics Other (See Comments)    unknown  . Sulfur     Patient Measurements: Height: 5\' 3"  (160 cm) Weight: 155 lb (70.308 kg) IBW/kg (Calculated) : 52.4 Adjusted Body Weight: 58.4 kg  Vital Signs: Temp: 98.6 F (37 C) (07/21 2335) Temp src: Rectal (07/21 2335) BP: 142/77 mmHg (07/21 2335) Pulse Rate: 95 (07/21 2335) Intake/Output from previous day:   Intake/Output from this shift:    Labs:  Recent Labs  06/08/13 2241  WBC 9.5  HGB 8.7*  PLT 153  CREATININE 4.41*   Estimated Creatinine Clearance: 9.6 ml/min (by C-G formula based on Cr of 4.41). No results found for this basename: VANCOTROUGH, Leodis Binet, VANCORANDOM, GENTTROUGH, GENTPEAK, GENTRANDOM, TOBRATROUGH, TOBRAPEAK, TOBRARND, AMIKACINPEAK, AMIKACINTROU, AMIKACIN,  in the last 72 hours   Microbiology: Recent Results (from the past 720 hour(s))  MRSA PCR SCREENING     Status: None   Collection Time    06/02/13  1:15 PM      Result Value Range Status   MRSA by PCR NEGATIVE  NEGATIVE Final   Comment:            The GeneXpert MRSA Assay (FDA     approved for NASAL specimens     only), is one component of a     comprehensive MRSA colonization     surveillance program. It is not     intended to diagnose MRSA     infection nor to guide or     monitor treatment for     MRSA infections.    Medical History: Past Medical History  Diagnosis Date  . Renal disorder     Medications:  Scheduled:   Infusions:  . ceFEPime (MAXIPIME) IV    . vancomycin     PRN:   Assessment: ESRD patient with recent revision of dialysis fistula done at [facility] last week.  No record of vancomycin given since June '14.  Given 1 dose cefepime 1gm.  Goal of Therapy:  1.  Maintain vancomycin trough approx 32mcg/ml 2.  Expected 8 days therapy  Plan:  1. Vancomycin 1gm  tonight 2.  Clinical pharmacist to review for further doses at [facility], Shon Baton 06/09/2013,12:21 AM

## 2013-06-09 NOTE — ED Notes (Signed)
Attempted to call report. Was advised nurse receiving pt will call back for report. 

## 2013-06-09 NOTE — Progress Notes (Signed)
Nutriton Follow-up   INTERVENTION: Resource Breeze po TID, each supplement provides 250 kcal and 9 grams of protein.  NUTRITION DIAGNOSIS: Inadequate oral intake related to decreased appetite as evidenced by recent diet hx recall and observed  0% meal intake today.  Goal: Pt to meet >/= 90% of their estimated nutrition needs  Monitor:  Po intake, labs, I/O's and wt trends  79 y.o. female   ASSESSMENT: pt recently assessed (06/03/13). She presented to ED with active bleeding from graft site. She received 3 units PRBC's. She was transferred to [facility] 7/16 where her dialysis fistula was ligated. Temporary dialysis access placed. Pt  discharged home 2 days later but returned to [facility] due to fever and decreased appetite. Some concern that she may have aspirated per MD.    Her appetite has been poor recently and today she has been sleeping most of the day and has 0% meal intake. ST evaluation pending. She is s/p thoracentesis today.  Nursing and her son tell me pt does not like Nepro. Will offer her an alternate choice and follow up for her receptivity.    Height: Ht Readings from Last 1 Encounters:  06/09/13 5\' 3"  (1.6 m)    Weight: Wt Readings from Last 1 Encounters:  06/09/13 152 lb 1.9 oz (69 kg)    Ideal Body Weight: 120# (54.5 kg)  % Ideal Body Weight: 133%  Wt Readings from Last 10 Encounters:  06/09/13 152 lb 1.9 oz (69 kg)  06/05/13 154 lb 8.7 oz (70.1 kg)  06/05/13 154 lb 8.7 oz (70.1 kg)  04/30/13 159 lb 9.8 oz (72.4 kg)  11/18/12 154 lb (69.854 kg)    Usual Body Weight: 155-160#  % Usual Body Weight: 103%  BMI:  Body mass index is 26.95 kg/(m^2).Overweight  Estimated Nutritional Needs: Kcal: 1500-1800 Protein: 80-90 gr  Fluid: 1000 ml plus urine output  Skin: No issues noted  Diet Order: Dysphagia 80/90gr 2-2/1200ml fluid restricition  EDUCATION NEEDS: -No education needs identified at this time   Intake/Output Summary (Last 24 hours) at 06/09/13  1734 Last data filed at 06/09/13 0830  Gross per 24 hour  Intake    500 ml  Output      0 ml  Net    500 ml    Last BM: 06/08/13  Labs:   Recent Labs Lab 06/04/13 0622 06/08/13 2241 06/09/13 0558  NA 138 136 138  K 3.5 3.4* 3.4*  CL 100 97 100  CO2 24 28 29   BUN 67* 25* 27*  CREATININE 8.21* 4.41* 4.84*  CALCIUM 7.8* 8.3* 8.1*  PHOS 6.6*  --   --   GLUCOSE 97 96 93    CBG (last 3)  No results found for this basename: GLUCAP,  in the last 72 hours  Scheduled Meds: . albuterol  2.5 mg Nebulization Q6H  . [START ON 06/10/2013] ceFEPime (MAXIPIME) IV  2 g Intravenous Q M,W,F-HD  . donepezil  10 mg Oral QHS  . feeding supplement (NEPRO CARB STEADY)  237 mL Oral TID BM  . ferrous sulfate  325 mg Oral BID WC  . heparin  5,000 Units Subcutaneous Q8H  . memantine  10 mg Oral BID  . multivitamin  1 tablet Oral Daily  . sodium chloride  3 mL Intravenous Q12H  . [START ON 06/10/2013] vancomycin  750 mg Intravenous Q M,W,F-HD    Continuous Infusions:   Past Medical History  Diagnosis Date  . Renal disorder     Past Surgical  History  Procedure Laterality Date  . Dialysis fistula creation    . Cholecystectomy    . Ligation of arteriovenous  fistula Left 06/04/2013    Procedure: LIGATION OF ARTERIOVENOUS  FISTULA;  Surgeon: Fransisco Hertz, MD;  Location: [facility] OR;  Service: Vascular;  Laterality: Left;  . Insertion of dialysis catheter Right 06/04/2013    Procedure: INSERTION OF DIALYSIS CATHETER;  Surgeon: Fransisco Hertz, MD;  Location: [facility] OR;  Service: Vascular;  Laterality: Right;    Royann Shivers MS,RD,LDN,CSG Office: [phone] Pager: [phone]

## 2013-06-09 NOTE — Evaluation (Signed)
Clinical/Bedside Swallow Evaluation  Patient Details  Name: [name] MRN: [id number] Date of Birth: 04-05-1931  Today's Date: 06/09/2013 Time: 4098-1191 SLP Time Calculation (min): 26 min  Past Medical History:  Past Medical History  Diagnosis Date  . Renal disorder    Past Surgical History:  Past Surgical History  Procedure Laterality Date  . Dialysis fistula creation    . Cholecystectomy    . Ligation of arteriovenous  fistula Left 06/04/2013    Procedure: LIGATION OF ARTERIOVENOUS  FISTULA;  Surgeon: Fransisco Hertz, MD;  Location: [facility] OR;  Service: Vascular;  Laterality: Left;  . Insertion of dialysis catheter Right 06/04/2013    Procedure: INSERTION OF DIALYSIS CATHETER;  Surgeon: Fransisco Hertz, MD;  Location: [facility] OR;  Service: Vascular;  Laterality: Right;   HPI:  [name] is a 79 y.o. female with a past medical history of end-stage renal disease on hemodialysis on Monday, Wednesday, Friday, history of left-sided pleural effusion, for which she underwent a thoracentesis last month. She was recently hospitalized at [facility] for bleeding fistula, which was ligated. She was discharged from [facility] on July 18. She's been home for 2 days and then today she has not been feeling well. Her temperature was 100.4 when her family checked it. She has been feeling weak. Has had poor appetite and, so, they decided to bring her to the hospital. Patient has a senile dementia and so history is very limited. She denies any chest pain, shortness of breath, cough. No nausea, vomiting. She hasn't had any difficulty swallowing her meals. No diarrhea. No dominant pain. She has had leg swelling, which is more than normal. History was provided by the patient's family. Chest x-ray shows: reaccumulation of left pleural effusion with associated airspace disease.   Assessment / Plan / Recommendation Clinical Impression  No overt signs or symptoms consistent with aspiration durin clinical swallow  evaluation, min lingual residuals with solids. Recommend continuing D3/thin with standard aspiration precautions.    Aspiration Risk  Mild    Diet Recommendation Dysphagia 3 (Mechanical Soft);Thin liquid   Liquid Administration via: Cup;Straw Medication Administration: Whole meds with liquid Supervision: Patient able to self feed;Intermittent supervision to cue for compensatory strategies Compensations: Slow rate;Small sips/bites Postural Changes and/or Swallow Maneuvers: Seated upright 90 degrees;Upright 30-60 min after meal    Other  Recommendations Oral Care Recommendations: Oral care BID Other Recommendations: Clarify dietary restrictions   Follow Up Recommendations  None    Frequency and Duration min 1 x/week  1 week       SLP Swallow Goals Patient will consume recommended diet without observed clinical signs of aspiration with: Supervision/safety   Swallow Study Prior Functional Status   Lives with grandson, dementia    General Date of Onset: 06/08/13 HPI: [name] is a 78 y.o. female with a past medical history of end-stage renal disease on hemodialysis on Monday, Wednesday, Friday, history of left-sided pleural effusion, for which she underwent a thoracentesis last month. She was recently hospitalized at [facility] for bleeding fistula, which was ligated. She was discharged from [facility] on July 18. She's been home for 2 days and then today she has not been feeling well. Her temperature was 100.4 when her family checked it. She has been feeling weak. Has had poor appetite and, so, they decided to bring her to the hospital. Patient has a senile dementia and so history is very limited. She denies any chest pain, shortness of breath, cough.  No nausea, vomiting. She hasn't had any difficulty swallowing her meals. No diarrhea. No dominant pain. She has had leg swelling, which is more than normal. History was provided by the patient's family. Chest x-ray shows: reaccumulation  of left pleural effusion with associated airspace disease. Type of Study: Bedside swallow evaluation Previous Swallow Assessment: None on record Diet Prior to this Study: Dysphagia 3 (soft);Thin liquids Temperature Spikes Noted: Yes (Tmax at home: 100.4`) Respiratory Status: Supplemental O2 delivered via (comment) (nasal canula) History of Recent Intubation: No Behavior/Cognition: Alert;Cooperative;Pleasant mood;Requires cueing Oral Cavity - Dentition: Adequate natural dentition Self-Feeding Abilities: Able to feed self with adaptive devices Patient Positioning: Upright in bed Baseline Vocal Quality: Clear Volitional Cough: Strong Volitional Swallow: Able to elicit    Oral/Motor/Sensory Function Overall Oral Motor/Sensory Function: Appears within functional limits for tasks assessed Labial ROM: Within Functional Limits Labial Symmetry: Within Functional Limits Labial Strength: Within Functional Limits Labial Sensation: Within Functional Limits Lingual ROM: Reduced right;Reduced left Lingual Symmetry: Within Functional Limits Lingual Strength: Reduced Lingual Sensation: Within Functional Limits Facial ROM: Within Functional Limits Facial Symmetry: Within Functional Limits Facial Strength: Within Functional Limits Facial Sensation: Within Functional Limits Velum: Within Functional Limits Mandible: Within Functional Limits   Ice Chips Ice chips: Within functional limits Presentation: Spoon   Thin Liquid Thin Liquid: Within functional limits Presentation: Cup;Straw;Spoon    Nectar Thick Nectar Thick Liquid: Not tested   Honey Thick Honey Thick Liquid: Not tested   Puree Puree: Within functional limits Presentation: Spoon   Solid       Solid: Impaired Presentation: Self Fed;Spoon Oral Phase Impairments: Reduced lingual movement/coordination Oral Phase Functional Implications: Oral residue      Thank you,  Havery Moros,  CCC-SLP [phone]  Deeya Richeson 06/09/2013,8:24 PM

## 2013-06-09 NOTE — Procedures (Signed)
PreOperative Dx: LEFT pleural effusion Postoperative Dx: LEFT pleural effusion Procedure:   US guided LEFT thoracentesis Radiologist:  Tyron Russell Anesthesia:  5 ml of 1% lidocaine Specimen:  780 ml of dirty yellow colored fluid EBL:   < 1 ml Complications: None

## 2013-06-09 NOTE — H&P (Signed)
[facility] History and Physical  [name] NWG:[id number] DOB: [birth year] DOA: 06/08/2013   PCP: Fredirick Maudlin, MD  Specialists:  Her nephrologist is Dr. Kristian Covey  Chief Complaint: Fever  HPI: [name] is a 79 y.o. female with a past medical history of end-stage renal disease on hemodialysis on Monday, Wednesday, Friday, history of left-sided pleural effusion, for which she underwent a thoracentesis last month. She was recently hospitalized at [facility] for bleeding fistula, which was ligated. She was discharged from [facility] on July 18. She's been home for 2 days and then today she has not been feeling well. Her temperature was 100.4 when her family checked it. She has been feeling weak. Has had poor appetite and, so, they decided to bring her to the hospital. Patient has a senile dementia and so history is very limited. She denies any chest pain, shortness of breath, cough. No nausea, vomiting. She hasn't had any difficulty swallowing her meals. No diarrhea. No dominant pain. She has had leg swelling, which is more than normal. History was provided by the patient's family.  Home Medications: Prior to Admission medications   Medication Sig Start Date End Date Taking? Authorizing Provider  donepezil (ARICEPT) 10 MG tablet Take 10 mg by mouth at bedtime.   Yes Historical Provider, MD  memantine (NAMENDA) 10 MG tablet Take 10 mg by mouth 2 (two) times daily. For memory   Yes Historical Provider, MD  multivitamin (RENA-VIT) TABS tablet Take 1 tablet by mouth daily.   Yes Historical Provider, MD  torsemide (DEMADEX) 20 MG tablet Take 20 mg by mouth daily.   Yes Historical Provider, MD  ferrous sulfate 325 (65 FE) MG tablet Take 1 tablet (325 mg total) by mouth 2 (two) times daily with a meal. 06/05/13   Renae Fickle, MD  Nutritional Supplements (FEEDING SUPPLEMENT, NEPRO CARB STEADY,) LIQD Take 237 mLs by mouth 3 (three) times daily as needed (Supplement). 06/05/13   Renae Fickle, MD    Allergies:  Allergies  Allergen Reactions  . Sulfa Antibiotics Other (See Comments)    unknown  . Sulfur     Past Medical History: Past Medical History  Diagnosis Date  . Renal disorder     Past Surgical History  Procedure Laterality Date  . Dialysis fistula creation    . Cholecystectomy    . Ligation of arteriovenous  fistula Left 06/04/2013    Procedure: LIGATION OF ARTERIOVENOUS  FISTULA;  Surgeon: Fransisco Hertz, MD;  Location: [facility] OR;  Service: Vascular;  Laterality: Left;  . Insertion of dialysis catheter Right 06/04/2013    Procedure: INSERTION OF DIALYSIS CATHETER;  Surgeon: Fransisco Hertz, MD;  Location: [facility] OR;  Service: Vascular;  Laterality: Right;    Social History:  reports that she has never smoked. She does not have any smokeless tobacco history on file. She reports that she does not drink alcohol or use illicit drugs.  Living Situation: Lives with her grandson Activity Level: She is able to ambulate, but her family. She doesn't really do much at home.   Family History: Denied any health problems in the family  Review of Systems - unable to do due to dementia  Physical Examination  Filed Vitals:   06/08/13 2106 06/08/13 2335  BP: 149/81 142/77  Pulse: 96 95  Temp: 99.9 F (37.7 C) 98.6 F (37 C)  TempSrc: Oral Rectal  Resp: 24 22  Height: 5\' 3"  (1.6 m)   Weight: 70.308 kg (155 lb)  SpO2: 96% 92%    General appearance: alert, cooperative, appears stated age and no distress Head: Normocephalic, without obvious abnormality, atraumatic Eyes: conjunctivae/corneas clear. PERRL, EOM's intact.  Throat: lips, mucosa, and tongue normal; teeth and gums normal Resp: decreased air entry at the left. Crackles on the right. No wheezing. Cardio: regular rate and rhythm, S1, S2 normal, no murmur, click, rub or gallop GI: soft, non-tender; bowel sounds normal; no masses,  no organomegaly Extremities: Lower extremities are slightly swollen. No erythema or  calf tenderness is present Pulses: 2+ and symmetric Skin: Skin color, texture, turgor normal. No rashes or lesions Neurologic: She is alert. Disoriented. No focal deficits are present.  Laboratory Data: Results for orders placed during the hospital encounter of 06/08/13 (from the past 48 hour(s))  URINALYSIS, ROUTINE W REFLEX MICROSCOPIC     Status: Abnormal   Collection Time    06/08/13  7:20 PM      Result Value Range   Color, Urine YELLOW  YELLOW   APPearance CLOUDY (*) CLEAR   Specific Gravity, Urine 1.020  1.005 - 1.030   pH 6.5  5.0 - 8.0   Glucose, UA NEGATIVE  NEGATIVE mg/dL   Hgb urine dipstick LARGE (*) NEGATIVE   Bilirubin Urine MODERATE (*) NEGATIVE   Ketones, ur TRACE (*) NEGATIVE mg/dL   Protein, ur >161 (*) NEGATIVE mg/dL   Urobilinogen, UA 0.2  0.0 - 1.0 mg/dL   Nitrite NEGATIVE  NEGATIVE   Leukocytes, UA NEGATIVE  NEGATIVE  URINE MICROSCOPIC-ADD ON     Status: Abnormal   Collection Time    06/08/13  7:20 PM      Result Value Range   Squamous Epithelial / LPF MANY (*) RARE   WBC, UA 21-50  <3 WBC/hpf   RBC / HPF 21-50  <3 RBC/hpf   Bacteria, UA MANY (*) RARE  CBC WITH DIFFERENTIAL     Status: Abnormal   Collection Time    06/08/13 10:41 PM      Result Value Range   WBC 9.5  4.0 - 10.5 K/uL   RBC 2.85 (*) 3.87 - 5.11 MIL/uL   Hemoglobin 8.7 (*) 12.0 - 15.0 g/dL   HCT 09.6 (*) 04.5 - 40.9 %   MCV 92.3  78.0 - 100.0 fL   MCH 30.5  26.0 - 34.0 pg   MCHC 33.1  30.0 - 36.0 g/dL   RDW 81.1 (*) 91.4 - 78.2 %   Platelets 153  150 - 400 K/uL   Neutrophils Relative % 70  43 - 77 %   Neutro Abs 6.7  1.7 - 7.7 K/uL   Lymphocytes Relative 20  12 - 46 %   Lymphs Abs 1.9  0.7 - 4.0 K/uL   Monocytes Relative 9  3 - 12 %   Monocytes Absolute 0.8  0.1 - 1.0 K/uL   Eosinophils Relative 1  0 - 5 %   Eosinophils Absolute 0.1  0.0 - 0.7 K/uL   Basophils Relative 0  0 - 1 %   Basophils Absolute 0.0  0.0 - 0.1 K/uL  COMPREHENSIVE METABOLIC PANEL     Status: Abnormal    Collection Time    06/08/13 10:41 PM      Result Value Range   Sodium 136  135 - 145 mEq/L   Potassium 3.4 (*) 3.5 - 5.1 mEq/L   Chloride 97  96 - 112 mEq/L   CO2 28  19 - 32 mEq/L   Glucose, Bld 96  70 - 99 mg/dL   BUN 25 (*) 6 - 23 mg/dL   Creatinine, Ser 1.30 (*) 0.50 - 1.10 mg/dL   Calcium 8.3 (*) 8.4 - 10.5 mg/dL   Total Protein 6.5  6.0 - 8.3 g/dL   Albumin 2.6 (*) 3.5 - 5.2 g/dL   AST 13  0 - 37 U/L   ALT 7  0 - 35 U/L   Alkaline Phosphatase 73  39 - 117 U/L   Total Bilirubin 0.4  0.3 - 1.2 mg/dL   GFR calc non Af Amer 9 (*) >90 mL/min   GFR calc Af Amer 10 (*) >90 mL/min   Comment:            The eGFR has been calculated     using the CKD EPI equation.     This calculation has not been     validated in all clinical     situations.     eGFR's persistently     <90 mL/min signify     possible Chronic Kidney Disease.  LACTIC ACID, PLASMA     Status: None   Collection Time    06/08/13 10:41 PM      Result Value Range   Lactic Acid, Venous 1.0  0.5 - 2.2 mmol/L    Radiology Reports: Dg Chest 2 View  06/08/2013   *RADIOLOGY REPORT*  Clinical Data: Fever.  Dialysis patient.  CHEST - 2 VIEW  Comparison: One-view chest 04/27/2013.  Findings: A right IJ dialysis catheter extends into the right atrium, nearly to the inferior cavoatrial junction.  The heart is enlarged.  A large left pleural effusion has reaccumulated.  Mild interstitial edema is present.  Left basilar airspace disease is associated with the effusion.  Minimal right basilar atelectasis is present.  IMPRESSION:  1.  Bridging callus catheter with the tip in the and potential be through the right atrium. 2.  Reaccumulation of left pleural effusion with associated airspace disease.  While this may represent atelectasis, infection is not excluded. 3.  Mild diffuse edema is present.   Original Report Authenticated By: Marin Roberts, M.D.    Problem List  Principal Problem:   HCAP (healthcare-associated  pneumonia) Active Problems:   Senile dementia   Pleural effusion   ESRD (end stage renal disease) on dialysis   Assessment: This is 79 year old, Caucasian female, who presents after she had was found to have a low-grade temperature at home. Evaluation in the ED, has revealed pneumonia as well as pleural effusion. She did have pleural effusion back in June, for which she underwent thoracentesis. This has reaccumulated. She has pedal edema as well.  Plan: #1 healthcare associated pneumonia: She'll be given broad-spectrum antibiotic coverage. Due to possibility of, aspiration we'll get a swallow evaluation.  #2 Left-sided pleural effusion: Thoracentesis will be repeated by ultrasound guidance in the morning. Will send fluid for lab analysis including cytology.  #3 pedal edema: Get a venous Doppler to look for clots. However the edema seems most likely secondary to her renal disease.  #4 end-stage renal disease on hemodialysis: Consult nephrologist. She was dialyzed on Monday. She's had ligation of her AV fistula due to bleeding. She now has a new catheter on the right chest.  #5 normocytic anemia: Hemoglobin is stable compared to previous values. She did have acute blood loss anemia when she had bleeding from her fistula site just recently. Continue to monitor.  #6 abnormal, UA: Appears to be a contaminated sample. Await cultures.   DVT  Prophylaxis: Heparin will be initiated post thoracentesis Code Status: CODE STATUS discussed with the family and they want her to be a full code Family Communication: Discussed with the patient's son and daughter-in-law  Disposition Plan: Admit to MedSurg bed   Further management decisions will depend on results of further testing and patient's response to treatment.  [facility]  [facility] Pager [phone]  If 7PM-7AM, please contact night-coverage [email or web address] Password [facility]  06/09/2013, 12:25 AM

## 2013-06-09 NOTE — Progress Notes (Signed)
ANTIBIOTIC CONSULT NOTE  Pharmacy Consult for Vancomycin and Cefepime Indication: suspected pneumonia in ESRD patient  Allergies  Allergen Reactions  . Sulfa Antibiotics Other (See Comments)    unknown  . Sulfur     Patient Measurements: Height: 5\' 3"  (160 cm) Weight: 152 lb 1.9 oz (69 kg) IBW/kg (Calculated) : 52.4 Adjusted Body Weight: 58.4 kg  Vital Signs: Temp: 98.3 F (36.8 C) (07/22 0655) Temp src: Oral (07/22 0655) BP: 146/72 mmHg (07/22 0655) Pulse Rate: 85 (07/22 0655) Intake/Output from previous day: 07/21 0701 - 07/22 0700 In: 500 [I.V.:500] Out: -  Intake/Output from this shift:    Labs:  Recent Labs  06/08/13 2241 06/09/13 0558  WBC 9.5 8.0  HGB 8.7* 8.3*  PLT 153 157  CREATININE 4.41* 4.84*   Estimated Creatinine Clearance: 8.6 ml/min (by C-G formula based on Cr of 4.84). No results found for this basename: VANCOTROUGH, Leodis Binet, VANCORANDOM, GENTTROUGH, GENTPEAK, GENTRANDOM, TOBRATROUGH, TOBRAPEAK, TOBRARND, AMIKACINPEAK, AMIKACINTROU, AMIKACIN,  in the last 72 hours   Microbiology: Recent Results (from the past 720 hour(s))  MRSA PCR SCREENING     Status: None   Collection Time    06/02/13  1:15 PM      Result Value Range Status   MRSA by PCR NEGATIVE  NEGATIVE Final   Comment:            The GeneXpert MRSA Assay (FDA     approved for NASAL specimens     only), is one component of a     comprehensive MRSA colonization     surveillance program. It is not     intended to diagnose MRSA     infection nor to guide or     monitor treatment for     MRSA infections.  CULTURE, BLOOD (ROUTINE X 2)     Status: None   Collection Time    06/08/13 10:41 PM      Result Value Range Status   Specimen Description BLOOD RIGHT ANTECUBITAL   Final   Special Requests BOTTLES DRAWN AEROBIC AND ANAEROBIC [facility] EACH   Final   Culture PENDING   Incomplete   Report Status PENDING   Incomplete  CULTURE, BLOOD (ROUTINE X 2)     Status: None   Collection Time     06/08/13 10:41 PM      Result Value Range Status   Specimen Description BLOOD RIGHT HAND   Final   Special Requests BOTTLES DRAWN AEROBIC AND ANAEROBIC 5CC EACH   Final   Culture PENDING   Incomplete   Report Status PENDING   Incomplete    Medical History: Past Medical History  Diagnosis Date  . Renal disorder     Medications:  Scheduled:  . albuterol  2.5 mg Nebulization Q6H  . donepezil  10 mg Oral QHS  . feeding supplement (NEPRO CARB STEADY)  237 mL Oral TID BM  . ferrous sulfate  325 mg Oral BID WC  . heparin  5,000 Units Subcutaneous Q8H  . memantine  10 mg Oral BID  . multivitamin  1 tablet Oral Daily  . sodium chloride  3 mL Intravenous Q12H   Infusions:    PRN:   Assessment: ESRD patient with recent revision of dialysis fistula done at [facility] last week being treated for HCAP.  HD generally every MWF.  Vancomycin and Cefepime 1000mg  given in last evening.  Goal of Therapy:  Pre-Hemodialysis Vancomycin level goal range =15-25 mcg/ml. Eradicate infection.  Plan:  Vancomycin 750mg   every HD (MWF). Cefepime 2000mg  IV every  HD (MWF). Pre-HD Vancomycin level next week if therapy continues.   Mady Gemma 06/09/2013,8:06 AM

## 2013-06-09 NOTE — Progress Notes (Signed)
error 

## 2013-06-10 DIAGNOSIS — I059 Rheumatic mitral valve disease, unspecified: Secondary | ICD-10-CM

## 2013-06-10 LAB — BASIC METABOLIC PANEL
BUN: 37 mg/dL — ABNORMAL HIGH (ref 6–23)
CO2: 28 mEq/L (ref 19–32)
Calcium: 8 mg/dL — ABNORMAL LOW (ref 8.4–10.5)
Chloride: 102 mEq/L (ref 96–112)
Creatinine, Ser: 6.4 mg/dL — ABNORMAL HIGH (ref 0.50–1.10)
GFR calc Af Amer: 6 mL/min — ABNORMAL LOW (ref >90)
GFR calc non Af Amer: 5 mL/min — ABNORMAL LOW (ref >90)
Glucose, Bld: 86 mg/dL (ref 70–99)
Potassium: 3.9 mEq/L (ref 3.5–5.1)
Sodium: 139 mEq/L (ref 135–145)

## 2013-06-10 LAB — URINE CULTURE

## 2013-06-10 LAB — PHOSPHORUS: Phosphorus: 6.4 mg/dL — ABNORMAL HIGH (ref 2.3–4.6)

## 2013-06-10 LAB — LEGIONELLA ANTIGEN, URINE

## 2013-06-10 LAB — AFB CULTURE WITH SMEAR (NOT AT ARMC)

## 2013-06-10 MED ORDER — LIDOCAINE-PRILOCAINE 2.5-2.5 % EX CREA: 1.0000 "application " | TOPICAL_CREAM | CUTANEOUS | Status: DC | PRN

## 2013-06-10 MED ORDER — LIDOCAINE HCL (PF) 1 % IJ SOLN: 5.0000 mL | INTRAMUSCULAR | Status: DC | PRN

## 2013-06-10 MED ORDER — PENTAFLUOROPROP-TETRAFLUOROETH EX AERO
1.0000 "application " | INHALATION_SPRAY | CUTANEOUS | Status: DC | PRN
  Filled 2013-06-10: qty 103.5

## 2013-06-10 MED ORDER — ALTEPLASE 2 MG IJ SOLR
2.0000 mg | Freq: Once | INTRAMUSCULAR | Status: AC | PRN
  Filled 2013-06-10: qty 2

## 2013-06-10 MED ORDER — NEPRO/CARBSTEADY PO LIQD: 237.0000 mL | ORAL | Status: DC | PRN

## 2013-06-10 MED ORDER — HEPARIN SODIUM (PORCINE) 1000 UNIT/ML DIALYSIS
1000.0000 [IU] | INTRAMUSCULAR | Status: DC | PRN
  Filled 2013-06-10: qty 1

## 2013-06-10 MED ORDER — EPOETIN ALFA 10000 UNIT/ML IJ SOLN
10000.0000 [IU] | INTRAMUSCULAR | Status: DC
  Administered 2013-06-10 – 2013-06-12 (×2): 10000 [IU] via INTRAVENOUS
  Filled 2013-06-10 (×3): qty 1

## 2013-06-10 MED ORDER — SODIUM CHLORIDE 0.9 % IV SOLN: 100.0000 mL | INTRAVENOUS | Status: DC | PRN

## 2013-06-10 MED ORDER — HEPARIN SODIUM (PORCINE) 1000 UNIT/ML DIALYSIS
20.0000 [IU]/kg | INTRAMUSCULAR | Status: DC | PRN
  Administered 2013-06-10: 1400 [IU] via INTRAVENOUS_CENTRAL
  Filled 2013-06-10: qty 2

## 2013-06-10 NOTE — Progress Notes (Signed)
*  PRELIMINARY RESULTS* Echocardiogram 2D Echocardiogram has been performed.  [name] 06/10/2013, 2:39 PM

## 2013-06-10 NOTE — Progress Notes (Signed)
PT Cancellation Note  Patient Details Name: [name] MRN: [id number] DOB: 09-25-1931   Cancelled Treatment:     Attempted to see pt but pt was in dialysis.   Kazandra Forstrom,CINDY 06/10/2013, 12:22 PM

## 2013-06-10 NOTE — Plan of Care (Signed)
Problem: Phase II Progression Outcomes Goal: Encourage coughing & deep breathing Talked to pt/family about importance of deep breathing and couching to help keep lung clear.

## 2013-06-10 NOTE — Progress Notes (Signed)
[name]  MRN: [id number]  DOB/AGE: 05-24-1931 79 y.o.  Primary Care Physician:HAWKINS,EDWARD L, MD  Admit date: 06/08/2013  Chief Complaint:  Chief Complaint  Patient presents with  . Fever    [name]-Pt presented on  06/08/2013 with  Chief Complaint  Patient presents with  . Fever  .    Pt offers no new complaints.    Pt answers to questions in one word- yes/ NO  Meds . albuterol  2.5 mg Nebulization Q6H  . ceFEPime (MAXIPIME) IV  2 g Intravenous Q M,W,F-HD  . donepezil  10 mg Oral QHS  . epoetin alfa  10,000 Units Intravenous Q M,W,F-HD  . feeding supplement (NEPRO CARB STEADY)  237 mL Oral TID BM  . feeding supplement  1 Container Oral TID BM  . ferrous sulfate  325 mg Oral BID WC  . heparin  5,000 Units Subcutaneous Q8H  . memantine  10 mg Oral BID  . multivitamin  1 tablet Oral Daily  . sodium chloride  3 mL Intravenous Q12H  . vancomycin  750 mg Intravenous Q M,W,F-HD     Physical Exam: Vital signs in last 24 hours: Temp:  [97.6 F (36.4 C)-99.5 F (37.5 C)] 98.1 F (36.7 C) (07/23 0500) Pulse Rate:  [75-87] 75 (07/23 0500) Resp:  [14-18] 14 (07/23 0500) BP: (113-125)/(58-79) 114/68 mmHg (07/23 0500) SpO2:  [95 %-100 %] 98 % (07/23 0727) Weight:  [152 lb 8.9 oz (69.2 kg)] 152 lb 8.9 oz (69.2 kg) (07/23 0500) Weight change: -2 lb 7.1 oz (-1.108 kg) Last BM Date: 06/08/13   Physical Exam: General- pt is awake,follows commands Resp- No acute REsp distress, clear to auscultation ( better ) CVS- S1S2 regular in rate and rhythm GIT- BS+, soft, NT, ND EXT- NO LE Edema, Cyanosis Access- PC  Lab Results: CBC  Recent Labs  06/08/13 2241 06/09/13 0558  WBC 9.5 8.0  HGB 8.7* 8.3*  HCT 26.3* 25.1*  PLT 153 157    BMET  Recent Labs  06/09/13 0558 06/10/13 0609  NA 138 139  K 3.4* 3.9  CL 100 102  CO2 29 28  GLUCOSE 93 86  BUN 27* 37*  CREATININE 4.84* 6.40*  CALCIUM 8.1* 8.0*    MICRO Recent Results (from the past 240 hour([name]))  MRSA PCR  SCREENING     Status: None   Collection Time    06/02/13  1:15 PM      Result Value Range Status   MRSA by PCR NEGATIVE  NEGATIVE Final   Comment:            The GeneXpert MRSA Assay (FDA     approved for NASAL specimens     only), is one component of a     comprehensive MRSA colonization     surveillance program. It is not     intended to diagnose MRSA     infection nor to guide or     monitor treatment for     MRSA infections.  CULTURE, BLOOD (ROUTINE X 2)     Status: None   Collection Time    06/08/13 10:41 PM      Result Value Range Status   Specimen Description BLOOD RIGHT ANTECUBITAL   Final   Special Requests BOTTLES DRAWN AEROBIC AND ANAEROBIC 6CC EACH   Final   Culture NO GROWTH 1 DAY   Final   Report Status PENDING   Incomplete  CULTURE, BLOOD (ROUTINE X 2)     Status:  None   Collection Time    06/08/13 10:41 PM      Result Value Range Status   Specimen Description BLOOD RIGHT HAND   Final   Special Requests BOTTLES DRAWN AEROBIC AND ANAEROBIC 5CC EACH   Final   Culture NO GROWTH 1 DAY   Final   Report Status PENDING   Incomplete      Lab Results  Component Value Date   PTH 92.3* 04/02/2009   CALCIUM 8.0* 06/10/2013   PHOS 6.4* 06/10/2013               Impression: 1)Renal  ESRD on HD  On MWF schedule   2)HTN  BP at goal   3)Anemia secondary to recent Bleed + Chronic disease.  Will keep on EPO   4)CKD Mineral-Bone Disorder  PTH over suppressed  Secondary Hyperparathyroidism Present.  Phosphorus at goal.   5)CNS- Dementia stable   6)FEN  Hypokalemic -now better  NOrmonatremic   7)Acid base  Co2 at goal   8) ID- admitted with Pneumonia  On ABX   9) Resp- Pleural effusion  [name]/p tap done yesterday    Plan:  Will dialyze today.       [name] 06/10/2013, 9:08 AM

## 2013-06-10 NOTE — Procedures (Signed)
   HEMODIALYSIS TREATMENT NOTE:  3.5 hour HD session completed through right IJ catheter.  Cath exit site unremarkable.  Goal met:  Tolerated removal of 2.4 liters with no interruption in ultrafiltration.  All blood was reinfused.

## 2013-06-10 NOTE — Progress Notes (Signed)
[name], [name]                 ACCOUNT NO.:  [id number]  MEDICAL RECORD NO.:  [id number]  LOCATION:  A337                          FACILITY:  [facility]  PHYSICIAN:  [name], M.D.DATE OF BIRTH:  [birth year]  DATE OF PROCEDURE: DATE OF DISCHARGE:                                PROGRESS NOTE   SUBJECTIVE:  [name] with healthcare-associated pneumonia.  She says she feels better.  She had a left-sided pleural effusion and had thoracentesis done yesterday.  She had a bleeding fistula which was ligated.  PHYSICAL EXAMINATION:  GENERAL:  Her exam shows she said she feels better. VITAL SIGNS:  She had a temperature max of 99.5 yesterday.  Current temperature 98.1, pulse is 75, respirations 14, blood pressure 114/68, O2 sats 98%. CHEST:  Clearer. HEART:  Regular. ABDOMEN:  Soft.  She is mildly confused as always.  Chest x-ray following her thoracentesis shows bibasilar effusions left more than right and the left has decreased after the thoracentesis and diffuse bilateral infiltrates compatible with pulmonary edema.  There was no pneumothorax.  ASSESSMENT:  She has multiple medical problems including chronic renal failure.  She may have pneumonia and she is being treated for healthcare- associated pneumonia.  She has anemia.  She has dementia.  Her pleural effusion shows some white blood cells, but does not appear to be an empyema.  PT evaluation could not be done because she would not wake up. Speech evaluation was done yesterday and showed no overt signs or symptoms of aspiration, but she was recommended to be on a mechanical soft diet with thin liquids which I think is appropriate.  PLAN:  Then is to continue with current treatments.  She is due for dialysis today, I believe.  That should help with any pulmonary edema. Continue with antibiotics.  It is not totally clear to me that she has pneumonia, so the length of time that she is going to need  the antibiotics is not clear at this time.  I will reassess after she has dialysis, etc.  Try to get PT to see her today and see she can do anything.     Jaylena Holloway L. Juanetta [name], M.D.     ELH/MEDQ  D:  06/10/2013  T:  06/10/2013  Job:  [id number]

## 2013-06-11 LAB — PTH, INTACT AND CALCIUM
Calcium, Total (PTH): 7.7 mg/dL — ABNORMAL LOW (ref 8.4–10.5)
PTH: 495.2 pg/mL — ABNORMAL HIGH (ref 14.0–72.0)

## 2013-06-11 MED ORDER — SEVELAMER CARBONATE 800 MG PO TABS
800.0000 mg | ORAL_TABLET | Freq: Three times a day (TID) | ORAL | Status: DC
  Administered 2013-06-11 – 2013-06-12 (×4): 800 mg via ORAL
  Filled 2013-06-11 (×4): qty 1

## 2013-06-11 NOTE — Evaluation (Signed)
Physical Therapy Evaluation Patient Details Name: [name] MRN: [id number] DOB: June 21, 1931 Today's Date: 06/11/2013 Time: 4098-1191 PT Time Calculation (min): 20 min  PT Assessment / Plan / Recommendation History of Present Illness    [name] is a 79 y.o. female with a past medical history of end-stage renal disease on hemodialysis on Monday, Wednesday, Friday, history of left-sided pleural effusion, for which she underwent a thoracentesis last month. She was recently hospitalized at [facility] for bleeding fistula, which was ligated. She was discharged from [facility] on July 18. She's been home for 2 days and then today she has not been feeling well. Her temperature was 100.4 when her family checked it. She has been feeling weak. Has had poor appetite and, so, they decided to bring her to the hospital. Patient has a senile dementia and so history is very limited. She denies any chest pain, shortness of breath, cough. No nausea, vomiting. She hasn't had any difficulty swallowing her meals. No diarrhea. No dominant pain. She has had leg swelling, which is more than normal. History was provided by the patient's family. Chest x-ray shows: reaccumulation of left pleural effusion with associated airspace disease.    Clinical Impression  Exam reveals pt to be close to previous functional level.  Pt does have slight decreased balance and will be safer with an assistive device.  We will ambulate with a cane next treatment to determine if she will be safe allowing more mobility than with a walker.    PT Assessment  Patient needs continued PT services    Follow Up Recommendations  Home health PT    Does the patient have the potential to tolerate intense rehabilitation    N/A  Barriers to Discharge  Do to cognitive issues pt will need to be checked in on frequently      Equipment Recommendations  Rolling walker with 5" wheels    Recommendations for Other Services     Frequency Min  3X/week    Precautions / Restrictions     Pertinent Vitals/Pain None noted      Mobility  Bed Mobility Bed Mobility: Supine to Sit Supine to Sit: 6: Modified independent (Device/Increase time) Transfers Transfers: Sit to Stand Sit to Stand: 6: Modified independent (Device/Increase time) Ambulation/Gait Ambulation/Gait Assistance: 6: Modified independent (Device/Increase time) (Pt "furniture walks"  at home explained that it is not safe) Ambulation Distance (Feet): 60 Feet Assistive device: Rolling walker Gait Pattern: Within Functional Limits;Step-through pattern;Trunk flexed Gait velocity: almost to fast to be safe Stairs: No    Exercises General Exercises - Lower Extremity Ankle Circles/Pumps: Both;10 reps Quad Sets: Both;10 reps Gluteal Sets: Both;5 reps Heel Slides: Both;10 reps Hip ABduction/ADduction: Both;10 reps Straight Leg Raises: Both;5 reps   PT Diagnosis: Generalized weakness  PT Problem List: Decreased activity tolerance;Decreased balance PT Treatment Interventions: Therapeutic activities;Stair training;Therapeutic exercise;Gait training     PT Goals(Current goals can be found in the care plan section)    Visit Information  Last PT Received On: 06/11/13       Prior Functioning    no assistive device; lives alone   Cognition  Cognition Overall Cognitive Status:  (Pt unable to state date, month, year or president . )    Extremity/Trunk Assessment Lower Extremity Assessment Lower Extremity Assessment: Overall WFL for tasks assessed   Balance  fair  End of Session PT - End of Session Equipment Utilized During Treatment: Gait belt Activity Tolerance: Patient tolerated treatment well Patient left: in  chair;with chair alarm set;with call bell/phone within reach  GP     RUSSELL,CINDY 06/11/2013, 9:37 AM

## 2013-06-11 NOTE — Progress Notes (Signed)
[name]                 ACCOUNT NO.:  [id number]  MEDICAL RECORD NO.:  [id number]  LOCATION:  A337                          FACILITY:  [facility]  PHYSICIAN:  [name], M.D.DATE OF BIRTH:  01/19/1931  DATE OF PROCEDURE: DATE OF DISCHARGE:                                PROGRESS NOTE   SUBJECTIVE:  [name] was admitted with pneumonia.  She has had previous episodes of pneumonia in the past.  She says she feels well.  PHYSICAL EXAMINATION:  VITAL SIGNS:  Her temperature is 98.2, pulse 73, respirations 17, blood pressure 115/63, O2 sats 98%. CHEST:  Relatively clear.  She had dialysis yesterday. HEART:  Regular without gallop. ABDOMEN:  Soft. NEUROLOGIC:  She is mildly confused as always.  ASSESSMENT AND PLAN:  Although, this was consistent with pneumonia.  She also had changes that looks like she may have had congestive heart failure.  She had a thoracentesis, which showed about 350 white blood cells, most of which were lymphocytes.  Pathology is still pending.  She is much improved.  I think she had pneumonia versus CHF.  The question now is whether she is going to be able to manage at home.  She does have a family member who lives with her and has another family member close by, but she has been in and out of the hospital on multiple occasions in the last few months.     [name], M.D.     ELH/MEDQ  D:  06/11/2013  T:  06/11/2013  Job:  [id number]

## 2013-06-11 NOTE — Progress Notes (Signed)
Subjective: Interval History: has no complaint of difficulty in breathing. She denies any cough. She said she is feeling better..  Objective: Vital signs in last 24 hours: Temp:  [98 F (36.7 C)-98.8 F (37.1 C)] 98.2 F (36.8 C) (07/24 1610) Pulse Rate:  [73-90] 73 (07/24 0700) Resp:  [17-18] 17 (07/24 0700) BP: (110-123)/(53-65) 115/63 mmHg (07/24 0633) SpO2:  [97 %-99 %] 98 % (07/24 0700) Weight:  [66.7 kg (147 lb 0.8 oz)-67.4 kg (148 lb 9.4 oz)] 67.4 kg (148 lb 9.4 oz) (07/24 9604) Weight change: -0.2 kg (-7.1 oz)  Intake/Output from previous day: 07/23 0701 - 07/24 0700 In: 480 [P.O.:480] Out: 2400  Intake/Output this shift:    General appearance: alert, cooperative and no distress Resp: diminished breath sounds bilaterally Cardio: regular rate and rhythm, S1, S2 normal, no murmur, click, rub or gallop GI: soft, non-tender; bowel sounds normal; no masses,  no organomegaly Extremities: extremities normal, atraumatic, no cyanosis or edema  Lab Results:  Recent Labs  06/08/13 2241 06/09/13 0558  WBC 9.5 8.0  HGB 8.7* 8.3*  HCT 26.3* 25.1*  PLT 153 157   BMET:  Recent Labs  06/09/13 0558 06/10/13 0609  NA 138 139  K 3.4* 3.9  CL 100 102  CO2 29 28  GLUCOSE 93 86  BUN 27* 37*  CREATININE 4.84* 6.40*  CALCIUM 8.1* 8.0*   No results found for this basename: PTH,  in the last 72 hours Iron Studies:  Recent Labs  06/08/13 2241  IRON 19*  TIBC 172*  FERRITIN 531*    Studies/Results: Dg Chest 1 View  06/09/2013   *RADIOLOGY REPORT*  Clinical Data: Post thoracentesis  CHEST - 1 VIEW  Comparison: Expiratory PA chest radiograph compared to 06/08/2013  Findings: Right jugular line tips project over right atrium. Enlargement of cardiac silhouette with pulmonary vascular congestion. Calcified tortuous aorta. Diffuse bilateral infiltrates compatible with pulmonary edema. Bibasilar effusions left greater than right, decreased on left since previous exam. No  pneumothorax. Stent identified left subclavian vein.  IMPRESSION: No pneumothorax following left thoracentesis.   Original Report Authenticated By: Ulyses Southward, M.D.   US Venous Img Lower Bilateral  06/09/2013   *RADIOLOGY REPORT*  Clinical Data: leg swelling;;  BILATERAL LOWER EXTREMITY VENOUS DUPLEX ULTRASOUND  Technique: Gray-scale sonography with compression, as well as color and duplex ultrasound, were performed to evaluate the deep venous system from the level of the common femoral vein through the popliteal and proximal calf veins.  Comparison: None  Findings:  Normal compressibility and normal Doppler signal within the common femoral, superficial femoral and popliteal veins, down to the proximal calf veins.  No grayscale filling defects to suggest DVT.  IMPRESSION: No evidence of bilateral lower extremity deep vein thrombosis.   Original Report Authenticated By: Charlett Nose, M.D.   US Thoracentesis Asp Pleural Space W/img Guide  06/09/2013   *RADIOLOGY REPORT*  CLINICAL DATA: RECURRENT LEFT PLEURAL EFFUSION  ULTRASOUND GUIDED LEFT THORACENTESIS:  Technique: After explanation of procedure and risks, written informed consent for thoracentesis obtained. Time out protocol followed. Left pleural effusion localized by ultrasound. Site at posterior leftchest prepped and draped in usual sterile fashion. Skin and chest wall anesthetized with 5 ml of 1% lidocaine. Standard 8-French thoracentesis catheter placed into left pleural space. 780 ml of dirty yellow fluid aspirated by syringe pump. Procedure tolerated well by patient without immediate complication.  IMPRESSION: Ultrasound-guided left thoracentesis with removal of 780 ml of pleural fluid. Fluid sent to laboratory for requested analysis.  Original Report Authenticated By: Ulyses Southward, M.D.    I have reviewed the patient's current medications.  Assessment/Plan: Problem #1 end-stage renal disease she status post hemodialysis yesterday her BUN and  creatinine was in acceptable range and normal potassium. Problem #2 pneumonia presently she is on antibiotics she is a febrile and her white blood cell count is normal Problem #3 metabolic bone disease her calcium was in acceptable range phosphorus is slightly high but stable. She's not had a binder. Problem #4 anemia multifactorial she is on Epogen Problem #5 hypertension her blood pressure seems to be reasonably controlled Problem #6 dementia Problem #7 poor nutrition and presently she is on Nepro. Plan: We'll DC iron tablets We'll start her on Renvella 800 mg one tablet 3 times a day with meals We'll make arrangement for patient to get dialysis tomorrow. We'll continue his other treatment and we'll check her basic metabolic panel and CBC in the morning   LOS: 3 days   Lema Heinkel S 06/11/2013,10:04 AM

## 2013-06-12 LAB — CBC
Platelets: 171 10*3/uL (ref 150–400)
RDW: 16.3 % — ABNORMAL HIGH (ref 11.5–15.5)
WBC: 7.7 10*3/uL (ref 4.0–10.5)

## 2013-06-12 LAB — BASIC METABOLIC PANEL
Calcium: 8.2 mg/dL — ABNORMAL LOW (ref 8.4–10.5)
Chloride: 103 mEq/L (ref 96–112)
Creatinine, Ser: 5.9 mg/dL — ABNORMAL HIGH (ref 0.50–1.10)
GFR calc Af Amer: 7 mL/min — ABNORMAL LOW (ref >90)
GFR calc non Af Amer: 6 mL/min — ABNORMAL LOW (ref >90)

## 2013-06-12 MED ORDER — ALTEPLASE 2 MG IJ SOLR
2.0000 mg | Freq: Once | INTRAMUSCULAR | Status: DC | PRN
  Filled 2013-06-12: qty 2

## 2013-06-12 MED ORDER — DEXTROSE 5 % IV SOLN: 2.0000 g | INTRAVENOUS | Status: DC

## 2013-06-12 MED ORDER — SODIUM CHLORIDE 0.9 % IV SOLN: 100.0000 mL | INTRAVENOUS | Status: DC | PRN

## 2013-06-12 MED ORDER — VANCOMYCIN HCL IN DEXTROSE 750-5 MG/150ML-% IV SOLN: 750.0000 mg | INTRAVENOUS | Status: DC

## 2013-06-12 NOTE — Progress Notes (Signed)
Pt is to be discharged home today. Pt is in NAD, IV is out, all paperwork has been reviewed/discussed with patient/family, and there are no questions/concerns at this time. Assessment is unchanged from this morning. Pt is to be accompanied downstairs by staff and family via wheelchair  

## 2013-06-12 NOTE — Progress Notes (Signed)
[name], [name]                 ACCOUNT NO.:  [id number]  MEDICAL RECORD NO.:  [id number]  LOCATION:  A337                          FACILITY:  [facility]  PHYSICIAN:  Tobenna Needs L. Juanetta Gosling, M.D.DATE OF BIRTH:  [birth year]  DATE OF PROCEDURE: DATE OF DISCHARGE:                                PROGRESS NOTE   SUBJECTIVE:  Ms. Loveless says she feels great.  She has not had any more fever.  She is set for dialysis today.  She got up with physical therapy and home health PT was recommended.  PHYSICAL EXAMINATION:  GENERAL:  She is awake and alert. VITAL SIGNS:  Temperature is 97.5, pulse 86, respirations are 18, blood pressure 137/64, O2 sats 94%. EYES:  Her pupils are reactive. CHEST:  Pretty clear with some rhonchi. HEART:  Regular without gallop. ABDOMEN:  Soft.  She does not have any peripheral edema.  LABORATORY DATA:  Her hemoglobin level 8.3, white count 7700.  BUN 36, creatinine 5.9.  ASSESSMENT:  She has healthcare-associated pneumonia and she is being treated for that.  She has chronic renal failure on dialysis, and she is to have dialysis today.  She has dementia which is unchanged.  She has anemia combined from blood loss and from anemia of chronic disease, and she is on iron for that.  Plan is for discharge after dialysis.  She will have Home Health Services.     Delton Stelle L. Juanetta Gosling, M.D.     ELH/MEDQ  D:  06/12/2013  T:  06/12/2013  Job:  [id number]

## 2013-06-12 NOTE — Progress Notes (Addendum)
Subjective: Interval History: has no complaint of difficulty in breathing. She denies any cough. She said she is feeling better. Presently offers no complaint  Objective: Vital signs in last 24 hours: Temp:  [97.5 F (36.4 C)-98.6 F (37 C)] 97.5 F (36.4 C) (07/25 0546) Pulse Rate:  [78-94] 86 (07/25 0546) Resp:  [18] 18 (07/25 0546) BP: (120-137)/(56-75) 137/64 mmHg (07/25 0546) SpO2:  [91 %-100 %] 94 % (07/25 0546) Weight:  [68.4 kg (150 lb 12.7 oz)] 68.4 kg (150 lb 12.7 oz) (07/25 0546) Weight change: -0.6 kg (-1 lb 5.2 oz)  Intake/Output from previous day: 07/24 0701 - 07/25 0700 In: 720 [P.O.:720] Out: -  Intake/Output this shift:    General appearance: alert, cooperative and no distress Resp: diminished breath sounds bilaterally Cardio: regular rate and rhythm, S1, S2 normal, no murmur, click, rub or gallop GI: soft, non-tender; bowel sounds normal; no masses,  no organomegaly Extremities: extremities normal, atraumatic, no cyanosis or edema  Lab Results:  Recent Labs  06/12/13 0603  WBC 7.7  HGB 8.3*  HCT 26.0*  PLT 171   BMET:   Recent Labs  06/10/13 0609 06/12/13 0603  NA 139 141  K 3.9 3.8  CL 102 103  CO2 28 29  GLUCOSE 86 96  BUN 37* 36*  CREATININE 6.40* 5.90*  CALCIUM 8.0*  7.7* 8.2*    Recent Labs  06/10/13 0609  PTH 495.2*   Iron Studies: No results found for this basename: IRON, TIBC, TRANSFERRIN, FERRITIN,  in the last 72 hours  Studies/Results: No results found.  I have reviewed the patient's current medications.  Assessment/Plan: Problem #1 end-stage renal disease she status post hemodialysis yesterday her BUN 36  and creatinine 5.90 with  in acceptable range and normal potassium. Problem #2 pneumonia presently she is on antibiotics she is a febrile and her white blood cell count is normal Problem #3 metabolic bone disease her calcium was in acceptable range phosphorus is slightly high but stable. She's not had a  binder. Problem #4 anemia multifactorial she is on Epogen H/H is 8.3/26 Problem #5 hypertension her blood pressure seems to be reasonably controlled Problem #6 dementia Problem #7 poor nutrition and presently she is on Nepro. Plan:  We'll make arrangement for patient to get dialysis today. We'll continue his other treatment and we'll check her basic metabolic panel and CBC in the morning   LOS: 4 days   Sumaiyah Markert S 06/12/2013,7:52 AM

## 2013-06-12 NOTE — Progress Notes (Signed)
ANTIBIOTIC CONSULT NOTE  Pharmacy Consult for Vancomycin and Cefepime Indication: suspected pneumonia in ESRD patient  Allergies  Allergen Reactions  . Sulfa Antibiotics Other (See Comments)    unknown  . Sulfur     Patient Measurements: Height: 5\' 3"  (160 cm) Weight: 150 lb 12.7 oz (68.4 kg) IBW/kg (Calculated) : 52.4 Adjusted Body Weight: 58.4 kg  Vital Signs: Temp: 98 F (36.7 C) (07/25 0915) Temp src: Oral (07/25 0915) BP: 129/70 mmHg (07/25 1100) Pulse Rate: 84 (07/25 1100) Intake/Output from previous day: 07/24 0701 - 07/25 0700 In: 720 [P.O.:720] Out: -  Intake/Output from this shift:    Labs:  Recent Labs  06/10/13 0609 06/12/13 0603  WBC  --  7.7  HGB  --  8.3*  PLT  --  171  CREATININE 6.40* 5.90*   Estimated Creatinine Clearance: 7.1 ml/min (by C-G formula based on Cr of 5.9). No results found for this basename: VANCOTROUGH, Leodis Binet, VANCORANDOM, GENTTROUGH, GENTPEAK, GENTRANDOM, TOBRATROUGH, TOBRAPEAK, TOBRARND, AMIKACINPEAK, AMIKACINTROU, AMIKACIN,  in the last 72 hours   Microbiology: Recent Results (from the past 720 hour(s))  MRSA PCR SCREENING     Status: None   Collection Time    06/02/13  1:15 PM      Result Value Range Status   MRSA by PCR NEGATIVE  NEGATIVE Final   Comment:            The GeneXpert MRSA Assay (FDA     approved for NASAL specimens     only), is one component of a     comprehensive MRSA colonization     surveillance program. It is not     intended to diagnose MRSA     infection nor to guide or     monitor treatment for     MRSA infections.  URINE CULTURE     Status: None   Collection Time    06/08/13  7:20 PM      Result Value Range Status   Specimen Description URINE, CATHETERIZED   Final   Special Requests NONE   Final   Culture  Setup Time 06/08/2013 23:30   Final   Colony Count NO GROWTH   Final   Culture NO GROWTH   Final   Report Status 06/10/2013 FINAL   Final  CULTURE, BLOOD (ROUTINE X 2)     Status:  None   Collection Time    06/08/13 10:41 PM      Result Value Range Status   Specimen Description BLOOD RIGHT ANTECUBITAL   Final   Special Requests BOTTLES DRAWN AEROBIC AND ANAEROBIC 6CC EACH   Final   Culture NO GROWTH 3 DAYS   Final   Report Status PENDING   Incomplete  CULTURE, BLOOD (ROUTINE X 2)     Status: None   Collection Time    06/08/13 10:41 PM      Result Value Range Status   Specimen Description BLOOD RIGHT HAND   Final   Special Requests BOTTLES DRAWN AEROBIC AND ANAEROBIC 5CC EACH   Final   Culture NO GROWTH 3 DAYS   Final   Report Status PENDING   Incomplete  BODY FLUID CULTURE     Status: None   Collection Time    06/09/13  1:20 PM      Result Value Range Status   Specimen Description FLUID THORACENTESIS COLLECTED BY DOCTOR BOLES   Final   Special Requests NONE   Final   Gram Stain  Final   Value: RARE WBC PRESENT, PREDOMINANTLY MONONUCLEAR     NO ORGANISMS SEEN   Culture NO GROWTH 2 DAYS   Final   Report Status PENDING   Incomplete    Medical History: Past Medical History  Diagnosis Date  . Renal disorder     Medications:  Scheduled:  . albuterol  2.5 mg Nebulization Q6H  . ceFEPime (MAXIPIME) IV  2 g Intravenous Q M,W,F-HD  . donepezil  10 mg Oral QHS  . epoetin alfa  10,000 Units Intravenous Q M,W,F-HD  . feeding supplement (NEPRO CARB STEADY)  237 mL Oral TID BM  . feeding supplement  1 Container Oral TID BM  . heparin  5,000 Units Subcutaneous Q8H  . memantine  10 mg Oral BID  . multivitamin  1 tablet Oral Daily  . sevelamer carbonate  800 mg Oral TID WC  . sodium chloride  3 mL Intravenous Q12H  . vancomycin  750 mg Intravenous Q M,W,F-HD   Infusions:    PRN:   Assessment: ESRD patient with recent revision of dialysis fistula done at [facility] last week being treated for HCAP.  HD MWF.  Micro data pending.  Goal of Therapy:  Pre-Hemodialysis Vancomycin level goal range =15-25 mcg/ml. Eradicate infection.  Plan:  Continue Vancomycin  750mg  every HD (MWF). Continue Cefepime 2000mg  IV every  HD (MWF). Pre-HD Vancomycin level next week if therapy continues.   Mady Gemma 06/12/2013,11:04 AM

## 2013-06-12 NOTE — Discharge Summary (Signed)
[name], LASCH                 ACCOUNT NO.:  [id number]  MEDICAL RECORD NO.:  [id number]  LOCATION:  A337                          FACILITY:  [facility]  PHYSICIAN:  Aryeh Butterfield L. Juanetta Gosling, M.D.DATE OF BIRTH:  1931/09/07  DATE OF ADMISSION:  06/08/2013 DATE OF DISCHARGE:  LH                              DISCHARGE SUMMARY   FINAL DISCHARGE DIAGNOSES: 1. Healthcare-associated pneumonia. 2. End-stage chronic renal disease on dialysis. 3. Dementia. 4. Pleural effusions. 5. Volume overload. 6. Anemia from acute blood loss and chronic disease.  Ms. Piercey is an 79 year old who came to the emergency department because of fever after she had a dialysis.  She is an 79 year old who has end-stage renal disease on dialysis, had a left-sided pleural effusion.  She had a previous episode of a bleeding fistula, which has been ligated.  She had a temperature at home, was brought to the hospital, and found to have an infiltrate, but more fluid as well.  PHYSICAL EXAMINATION:  GENERAL:  She is confused.  She was febrile. HEART:  Regular. ABDOMEN:  Soft. EXTREMITIES:  No edema. CENTRAL NERVOUS SYSTEM:  Grossly intact.  She was started on cefepime and vancomycin.  She had thoracentesis.  She had dialysis while she was here.  She had PT consultation, it was felt that she would be able to be managed at home.  Plan then is for discharge home after dialysis today.  She will continue current medications including vancomycin and cefepime after dialysis.  She will be using feeding supplements at home.  She will be on iron 325 mg b.i.d., torsemide 20 mg daily, renal vitamins 1 daily, Namenda 10 mg b.i.d., Aricept 10 mg daily, and she will have Home Health Services for physical therapy.     Everado Pillsbury L. Juanetta Gosling, M.D.     ELH/MEDQ  D:  06/12/2013  T:  06/12/2013  Job:  [id number]

## 2013-06-12 NOTE — Procedures (Signed)
    HEMODIALYSIS TREATMENT NOTE:  3.5 hour HD session completed through right IJ catheter.  Exit site unremarkable.  Goal met:  Tolerated removal of 3 liters with no interruption in ultrafiltration.  All blood was reinfused.  Report given to Sammuel Bailiff, RN.  Arman Filter, RN, CDN

## 2013-06-13 LAB — BODY FLUID CULTURE: Culture: NO GROWTH

## 2013-06-13 LAB — CULTURE, BLOOD (ROUTINE X 2): Culture: NO GROWTH

## 2013-06-15 NOTE — Progress Notes (Signed)
UR chart review completed.  

## 2013-06-18 ENCOUNTER — Encounter: Payer: Self-pay | Admitting: Vascular Surgery

## 2013-06-19 ENCOUNTER — Ambulatory Visit (INDEPENDENT_AMBULATORY_CARE_PROVIDER_SITE_OTHER): Payer: Medicare HMO | Admitting: Vascular Surgery

## 2013-06-19 ENCOUNTER — Encounter (INDEPENDENT_AMBULATORY_CARE_PROVIDER_SITE_OTHER): Payer: Medicare HMO | Admitting: *Deleted

## 2013-06-19 ENCOUNTER — Ambulatory Visit: Payer: Medicare HMO | Admitting: Vascular Surgery

## 2013-06-19 ENCOUNTER — Encounter: Payer: Self-pay | Admitting: Vascular Surgery

## 2013-06-19 VITALS — BP 149/80 | HR 85 | Temp 98.2°F | Resp 16 | Ht 63.0 in | Wt 155.0 lb

## 2013-06-19 DIAGNOSIS — Z4802 Encounter for removal of sutures: Secondary | ICD-10-CM

## 2013-06-19 DIAGNOSIS — N186 End stage renal disease: Secondary | ICD-10-CM

## 2013-06-19 DIAGNOSIS — Z01810 Encounter for preprocedural cardiovascular examination: Secondary | ICD-10-CM

## 2013-06-19 DIAGNOSIS — T82898A Other specified complication of vascular prosthetic devices, implants and grafts, initial encounter: Secondary | ICD-10-CM

## 2013-06-19 DIAGNOSIS — N184 Chronic kidney disease, stage 4 (severe): Secondary | ICD-10-CM

## 2013-06-19 NOTE — Progress Notes (Signed)
[facility] [place]  Postoperative Access Visit  History of Present Illness  [name] is a 79 y.o. year old female who presents for postoperative follow-up for: RIJV TDC, Ligation L BC AVF, exc of ulcerated L BC AVF (Date: 06/04/13).  The required urgent intervention due to bleeding from aneurysmal L BC AVF. The patient's wounds are healed.  The patient notes no steal symptoms.  The patient is able to complete their activities of daily living.  The patient's current symptoms are: none.  Past Medical History  Diagnosis Date  . Renal disorder     Past Surgical History  Procedure Laterality Date  . Dialysis fistula creation    . Cholecystectomy    . Ligation of arteriovenous  fistula Left 06/04/2013    Procedure: LIGATION OF ARTERIOVENOUS  FISTULA;  Surgeon: Fransisco Hertz, MD;  Location: [facility] OR;  Service: Vascular;  Laterality: Left;  . Insertion of dialysis catheter Right 06/04/2013    Procedure: INSERTION OF DIALYSIS CATHETER;  Surgeon: Fransisco Hertz, MD;  Location: [facility] OR;  Service: Vascular;  Laterality: Right;    History   Social History  . Marital Status: Divorced    Spouse Name: N/A    Number of Children: N/A  . Years of Education: N/A   Occupational History  . Not on file.   Social History Main Topics  . Smoking status: Never Smoker   . Smokeless tobacco: Not on file  . Alcohol Use: No  . Drug Use: No  . Sexually Active: No   Other Topics Concern  . Not on file   Social History Narrative  . No narrative on file    No family history on file.   Current Outpatient Prescriptions on File Prior to Visit  Medication Sig Dispense Refill  . dextrose 5 % SOLN 50 mL with ceFEPIme 2 G SOLR 2 g Inject 2 g into the vein every Monday, Wednesday, and Friday with hemodialysis.  5 ampule  0  . donepezil (ARICEPT) 10 MG tablet Take 10 mg by mouth at bedtime.      . ferrous sulfate 325 (65 FE) MG tablet Take 1 tablet (325 mg total) by mouth 2 (two) times  daily with a meal.  60 tablet  0  . memantine (NAMENDA) 10 MG tablet Take 10 mg by mouth 2 (two) times daily. For memory      . multivitamin (RENA-VIT) TABS tablet Take 1 tablet by mouth daily.      . Nutritional Supplements (FEEDING SUPPLEMENT, NEPRO CARB STEADY,) LIQD Take 237 mLs by mouth 3 (three) times daily as needed (Supplement).    0  . torsemide (DEMADEX) 20 MG tablet Take 20 mg by mouth daily.      . Vancomycin (VANCOCIN) 750 MG/150ML SOLN Inject 150 mLs (750 mg total) into the vein every Monday, Wednesday, and Friday with hemodialysis.  750 mL  0   No current facility-administered medications on file prior to visit.    Allergies  Allergen Reactions  . Sulfa Antibiotics Other (See Comments)    unknown  . Sulfur     REVIEW OF SYSTEMS:  (Positives checked otherwise negative)  CARDIOVASCULAR:  []  chest pain, []  chest pressure, []  palpitations, []  shortness of breath when laying flat, []  shortness of breath with exertion,  []  pain in feet when walking, []  pain in feet when laying flat, []  history of blood clot in veins (DVT), []  history of phlebitis, []  swelling in legs, []  varicose  veins  PULMONARY:  []  productive cough, []  asthma, []  wheezing  NEUROLOGIC:  []  weakness in arms or legs, []  numbness in arms or legs, []  difficulty speaking or slurred speech, []  temporary loss of vision in one eye, []  dizziness  HEMATOLOGIC:  []  bleeding problems, []  problems with blood clotting too easily  MUSCULOSKEL:  []  joint pain, []  joint swelling  GASTROINTEST:  []  vomiting blood, []  blood in stool     GENITOURINARY:  []  burning with urination, []  blood in urine, [x]  esrd-hd  PSYCHIATRIC:  []  history of major depression  INTEGUMENTARY:  []  rashes, []  ulcers  For VQI Use Only  PRE-ADM LIVING: Home  AMB STATUS: Ambulatory  Physical Examination Filed Vitals:   06/19/13 1630  BP: 149/80  Pulse: 85  Temp: 98.2 F (36.8 C)  Resp: 16   PULM: CTAB, sym exp, good air  movement  CV: RRR, Nl S2, S2, no M/R/T/G  LUE: Incision is healed, staples in place, with thrombosed distal fistula segment, skin feels warm, hand grip is 5/5, sensation in digits is intact, no palpable thrill, bruit can no be auscultated   Medical Decision Making  [name] is a 79 y.o. year old female who presents s/p Ligation of L BC AVF with excision of ulcerated segment.  The patient's next access option would be a R BC AVF.  She is reluctant to proceed at this point.    Her son is going to try to convince her to proceed. Risk, benefits, and alternatives to access surgery were discussed.  The patient is aware the risks include but are not limited to: bleeding, infection, steal syndrome, nerve damage, ischemic monomelic neuropathy, failure to mature, need for additional procedures, death and stroke.   The pt and family will let us know if they agree to proceed.  Thank you for allowing [place] to participate in this patient's care.  Leonides Sake, MD [facility] [place] Office: [phone] Pager: [phone]  06/19/2013, 5:25 PM

## 2013-06-26 ENCOUNTER — Other Ambulatory Visit: Payer: Self-pay

## 2013-06-26 ENCOUNTER — Encounter (HOSPITAL_COMMUNITY): Payer: Self-pay | Admitting: Pharmacy Technician

## 2013-07-06 ENCOUNTER — Encounter (HOSPITAL_COMMUNITY): Payer: Self-pay | Admitting: *Deleted

## 2013-07-06 MED ORDER — SODIUM CHLORIDE 0.9 % IV SOLN
INTRAVENOUS | Status: DC
  Administered 2013-07-07: 07:00:00 via INTRAVENOUS

## 2013-07-06 MED ORDER — DEXTROSE 5 % IV SOLN
1.5000 g | INTRAVENOUS | Status: AC
  Administered 2013-07-07: 1.5 g via INTRAVENOUS
  Filled 2013-07-06: qty 1.5

## 2013-07-06 NOTE — Progress Notes (Signed)
Pt's son was called 2 times early in the day, the second time  Around 1610, I was instructed to call him around 1900.  When I spoke with Trilby Leaver, he said that patient was assured that patient would be second case.  (Pt has dementia and takes a long time to get moving in am.  I called MD on call and discussed the situation.  He said to ask patient to arrive as close to 5:30 as possible. I apologized to Mr Applin.  Mr Asaro said that they would do the best they could.

## 2013-07-07 ENCOUNTER — Encounter (HOSPITAL_COMMUNITY): Payer: Self-pay | Admitting: Critical Care Medicine

## 2013-07-07 ENCOUNTER — Telehealth: Payer: Self-pay | Admitting: Vascular Surgery

## 2013-07-07 ENCOUNTER — Ambulatory Visit (HOSPITAL_COMMUNITY)
Admission: RE | Admit: 2013-07-07 | Discharge: 2013-07-07 | Disposition: A | Discharge status: Home / Self Care | Payer: Medicare HMO | Source: Ambulatory Visit | Attending: Vascular Surgery | Admitting: Vascular Surgery

## 2013-07-07 ENCOUNTER — Encounter (HOSPITAL_COMMUNITY)
Admission: RE | Disposition: A | Discharge status: Home / Self Care | Payer: Self-pay | Source: Ambulatory Visit | Attending: Vascular Surgery

## 2013-07-07 ENCOUNTER — Ambulatory Visit (HOSPITAL_COMMUNITY): Payer: Medicare HMO | Admitting: Critical Care Medicine

## 2013-07-07 DIAGNOSIS — Z79899 Other long term (current) drug therapy: Secondary | ICD-10-CM | POA: Insufficient documentation

## 2013-07-07 DIAGNOSIS — F0390 Unspecified dementia without behavioral disturbance: Secondary | ICD-10-CM | POA: Insufficient documentation

## 2013-07-07 DIAGNOSIS — N186 End stage renal disease: Secondary | ICD-10-CM | POA: Insufficient documentation

## 2013-07-07 DIAGNOSIS — Z992 Dependence on renal dialysis: Secondary | ICD-10-CM | POA: Insufficient documentation

## 2013-07-07 DIAGNOSIS — Z882 Allergy status to sulfonamides status: Secondary | ICD-10-CM | POA: Insufficient documentation

## 2013-07-07 HISTORY — DX: Personal history of other medical treatment: Z92.89

## 2013-07-07 HISTORY — PX: AV FISTULA PLACEMENT: SHX1204

## 2013-07-07 LAB — POCT I-STAT 4, (NA,K, GLUC, HGB,HCT)
Glucose, Bld: 86 mg/dL (ref 70–99)
Potassium: 3.2 mEq/L — ABNORMAL LOW (ref 3.5–5.1)
Sodium: 137 mEq/L (ref 135–145)

## 2013-07-07 SURGERY — ARTERIOVENOUS (AV) FISTULA CREATION
Anesthesia: General | Site: Arm Lower | Laterality: Right | Wound class: Clean

## 2013-07-07 MED ORDER — THROMBIN 20000 UNITS EX SOLR
CUTANEOUS | Status: DC | PRN
  Administered 2013-07-07: 09:00:00 via TOPICAL

## 2013-07-07 MED ORDER — SODIUM CHLORIDE 0.9 % IR SOLN
Status: DC | PRN
  Administered 2013-07-07: 08:00:00

## 2013-07-07 MED ORDER — OXYCODONE HCL 5 MG PO TABS: 5.0000 mg | ORAL_TABLET | ORAL | Status: DC | PRN

## 2013-07-07 MED ORDER — BUPIVACAINE HCL (PF) 0.5 % IJ SOLN
INTRAMUSCULAR | Status: AC
  Filled 2013-07-07: qty 30

## 2013-07-07 MED ORDER — FENTANYL CITRATE 0.05 MG/ML IJ SOLN
INTRAMUSCULAR | Status: DC | PRN
  Administered 2013-07-07 (×4): 25 ug via INTRAVENOUS
  Administered 2013-07-07: 75 ug via INTRAVENOUS

## 2013-07-07 MED ORDER — MEPERIDINE HCL 25 MG/ML IJ SOLN: 6.2500 mg | INTRAMUSCULAR | Status: DC | PRN

## 2013-07-07 MED ORDER — OXYCODONE HCL 5 MG/5ML PO SOLN: 5.0000 mg | Freq: Once | ORAL | Status: DC | PRN

## 2013-07-07 MED ORDER — ONDANSETRON HCL 4 MG/2ML IJ SOLN
INTRAMUSCULAR | Status: DC | PRN
  Administered 2013-07-07: 4 mg via INTRAVENOUS

## 2013-07-07 MED ORDER — PHENYLEPHRINE HCL 10 MG/ML IJ SOLN
INTRAMUSCULAR | Status: DC | PRN
  Administered 2013-07-07 (×2): 80 ug via INTRAVENOUS
  Administered 2013-07-07: 40 ug via INTRAVENOUS

## 2013-07-07 MED ORDER — HYDROMORPHONE HCL PF 1 MG/ML IJ SOLN: 0.2500 mg | INTRAMUSCULAR | Status: DC | PRN

## 2013-07-07 MED ORDER — PROPOFOL 10 MG/ML IV BOLUS
INTRAVENOUS | Status: DC | PRN
  Administered 2013-07-07: 150 mg via INTRAVENOUS

## 2013-07-07 MED ORDER — 0.9 % SODIUM CHLORIDE (POUR BTL) OPTIME
TOPICAL | Status: DC | PRN
  Administered 2013-07-07: 1000 mL

## 2013-07-07 MED ORDER — LIDOCAINE-EPINEPHRINE (PF) 1 %-1:200000 IJ SOLN
INTRAMUSCULAR | Status: AC
  Filled 2013-07-07: qty 10

## 2013-07-07 MED ORDER — DEXAMETHASONE SODIUM PHOSPHATE 4 MG/ML IJ SOLN
INTRAMUSCULAR | Status: DC | PRN
  Administered 2013-07-07: 4 mg via INTRAVENOUS

## 2013-07-07 MED ORDER — ONDANSETRON HCL 4 MG/2ML IJ SOLN: 4.0000 mg | Freq: Once | INTRAMUSCULAR | Status: DC | PRN

## 2013-07-07 MED ORDER — THROMBIN 20000 UNITS EX SOLR
CUTANEOUS | Status: AC
  Filled 2013-07-07: qty 20000

## 2013-07-07 MED ORDER — OXYCODONE HCL 5 MG PO TABS: 5.0000 mg | ORAL_TABLET | Freq: Once | ORAL | Status: DC | PRN

## 2013-07-07 MED ORDER — LIDOCAINE HCL (CARDIAC) 20 MG/ML IV SOLN
INTRAVENOUS | Status: DC | PRN
  Administered 2013-07-07: 100 mg via INTRAVENOUS

## 2013-07-07 SURGICAL SUPPLY — 36 items
ARMBAND PINK RESTRICT EXTREMIT (MISCELLANEOUS) ×2 IMPLANT
CANISTER SUCTION 2500CC (MISCELLANEOUS) ×2 IMPLANT
CLIP TI MEDIUM 6 (CLIP) ×2 IMPLANT
CLIP TI WIDE RED SMALL 6 (CLIP) ×2 IMPLANT
CLOTH BEACON ORANGE TIMEOUT ST (SAFETY) ×2 IMPLANT
COVER PROBE W GEL 5X96 (DRAPES) ×2 IMPLANT
COVER SURGICAL LIGHT HANDLE (MISCELLANEOUS) ×2 IMPLANT
DECANTER SPIKE VIAL GLASS SM (MISCELLANEOUS) ×2 IMPLANT
DERMABOND ADVANCED (GAUZE/BANDAGES/DRESSINGS) ×1
DERMABOND ADVANCED .7 DNX12 (GAUZE/BANDAGES/DRESSINGS) ×1 IMPLANT
ELECT REM PT RETURN 9FT ADLT (ELECTROSURGICAL) ×2
ELECTRODE REM PT RTRN 9FT ADLT (ELECTROSURGICAL) ×1 IMPLANT
GLOVE BIO SURGEON STRL SZ7 (GLOVE) ×2 IMPLANT
GLOVE BIO SURGEON STRL SZ8 (GLOVE) ×2 IMPLANT
GLOVE BIOGEL PI IND STRL 6.5 (GLOVE) ×1 IMPLANT
GLOVE BIOGEL PI IND STRL 7.5 (GLOVE) ×1 IMPLANT
GLOVE BIOGEL PI INDICATOR 6.5 (GLOVE) ×1
GLOVE BIOGEL PI INDICATOR 7.5 (GLOVE) ×1
GLOVE SURG SS PI 7.5 STRL IVOR (GLOVE) ×2 IMPLANT
GOWN STRL NON-REIN LRG LVL3 (GOWN DISPOSABLE) ×6 IMPLANT
KIT BASIN OR (CUSTOM PROCEDURE TRAY) ×2 IMPLANT
KIT ROOM TURNOVER OR (KITS) ×2 IMPLANT
NEEDLE HYPO 25GX1X1/2 BEV (NEEDLE) ×2 IMPLANT
NS IRRIG 1000ML POUR BTL (IV SOLUTION) ×2 IMPLANT
PACK CV ACCESS (CUSTOM PROCEDURE TRAY) ×2 IMPLANT
PAD ARMBOARD 7.5X6 YLW CONV (MISCELLANEOUS) ×4 IMPLANT
SPONGE SURGIFOAM ABS GEL 100 (HEMOSTASIS) ×2 IMPLANT
SUT MNCRL AB 4-0 PS2 18 (SUTURE) ×2 IMPLANT
SUT PROLENE 6 0 BV (SUTURE) IMPLANT
SUT PROLENE 7 0 BV 1 (SUTURE) ×2 IMPLANT
SUT VIC AB 3-0 SH 27 (SUTURE) ×1
SUT VIC AB 3-0 SH 27X BRD (SUTURE) ×1 IMPLANT
TOWEL OR 17X24 6PK STRL BLUE (TOWEL DISPOSABLE) ×2 IMPLANT
TOWEL OR 17X26 10 PK STRL BLUE (TOWEL DISPOSABLE) ×2 IMPLANT
UNDERPAD 30X30 INCONTINENT (UNDERPADS AND DIAPERS) ×2 IMPLANT
WATER STERILE IRR 1000ML POUR (IV SOLUTION) ×2 IMPLANT

## 2013-07-07 NOTE — Preoperative (Signed)
Beta Blockers   Reason not to administer Beta Blockers:Not Applicable 

## 2013-07-07 NOTE — Anesthesia Procedure Notes (Signed)
Procedure Name: LMA Insertion Date/Time: 07/07/2013 7:30 AM Performed by: Elon Alas Pre-anesthesia Checklist: Patient identified, Timeout performed, Emergency Drugs available, Suction available and Patient being monitored Patient Re-evaluated:Patient Re-evaluated prior to inductionOxygen Delivery Method: Circle system utilized Preoxygenation: Pre-oxygenation with 100% oxygen Intubation Type: IV induction Ventilation: Mask ventilation without difficulty LMA: LMA inserted LMA Size: 4.0 Number of attempts: 1 Placement Confirmation: positive ETCO2 and breath sounds checked- equal and bilateral Tube secured with: Tape Dental Injury: Teeth and Oropharynx as per pre-operative assessment

## 2013-07-07 NOTE — Anesthesia Postprocedure Evaluation (Signed)
Anesthesia Post Note  Patient: [name]  Procedure(s) Performed: Procedure(s) (LRB): ARTERIOVENOUS (AV) FISTULA CREATION - RIGHT BRACHIAL CEPHALIC  (Right)  Anesthesia type: general  Patient location: PACU  Post pain: Pain level controlled  Post assessment: Patient's Cardiovascular Status Stable  Last Vitals:  Filed Vitals:   07/07/13 1019  BP:   Pulse: 75  Temp: 36 C  Resp: 15    Post vital signs: Reviewed and stable  Level of consciousness: sedated  Complications: No apparent anesthesia complications

## 2013-07-07 NOTE — H&P (View-Only) (Signed)
[facility] [place]  Postoperative Access Visit  History of Present Illness  [name] is a 79 y.o. year old female who presents for postoperative follow-up for: RIJV TDC, Ligation L BC AVF, exc of ulcerated L BC AVF (Date: 06/04/13).  The required urgent intervention due to bleeding from aneurysmal L BC AVF. The patient's wounds are healed.  The patient notes no steal symptoms.  The patient is able to complete their activities of daily living.  The patient's current symptoms are: none.  Past Medical History  Diagnosis Date  . Renal disorder     Past Surgical History  Procedure Laterality Date  . Dialysis fistula creation    . Cholecystectomy    . Ligation of arteriovenous  fistula Left 06/04/2013    Procedure: LIGATION OF ARTERIOVENOUS  FISTULA;  Surgeon: Cerinity Zynda L Sebrena Engh, MD;  Location: [facility] OR;  Service: Vascular;  Laterality: Left;  . Insertion of dialysis catheter Right 06/04/2013    Procedure: INSERTION OF DIALYSIS CATHETER;  Surgeon: Tome Wilson L Aryiah Monterosso, MD;  Location: [facility] OR;  Service: Vascular;  Laterality: Right;    History   Social History  . Marital Status: Divorced    Spouse Name: N/A    Number of Children: N/A  . Years of Education: N/A   Occupational History  . Not on file.   Social History Main Topics  . Smoking status: Never Smoker   . Smokeless tobacco: Not on file  . Alcohol Use: No  . Drug Use: No  . Sexually Active: No   Other Topics Concern  . Not on file   Social History Narrative  . No narrative on file    No family history on file.   Current Outpatient Prescriptions on File Prior to Visit  Medication Sig Dispense Refill  . dextrose 5 % SOLN 50 mL with ceFEPIme 2 G SOLR 2 g Inject 2 g into the vein every Monday, Wednesday, and Friday with hemodialysis.  5 ampule  0  . donepezil (ARICEPT) 10 MG tablet Take 10 mg by mouth at bedtime.      . ferrous sulfate 325 (65 FE) MG tablet Take 1 tablet (325 mg total) by mouth 2 (two) times  daily with a meal.  60 tablet  0  . memantine (NAMENDA) 10 MG tablet Take 10 mg by mouth 2 (two) times daily. For memory      . multivitamin (RENA-VIT) TABS tablet Take 1 tablet by mouth daily.      . Nutritional Supplements (FEEDING SUPPLEMENT, NEPRO CARB STEADY,) LIQD Take 237 mLs by mouth 3 (three) times daily as needed (Supplement).    0  . torsemide (DEMADEX) 20 MG tablet Take 20 mg by mouth daily.      . Vancomycin (VANCOCIN) 750 MG/150ML SOLN Inject 150 mLs (750 mg total) into the vein every Monday, Wednesday, and Friday with hemodialysis.  750 mL  0   No current facility-administered medications on file prior to visit.    Allergies  Allergen Reactions  . Sulfa Antibiotics Other (See Comments)    unknown  . Sulfur     REVIEW OF SYSTEMS:  (Positives checked otherwise negative)  CARDIOVASCULAR:  [] chest pain, [] chest pressure, [] palpitations, [] shortness of breath when laying flat, [] shortness of breath with exertion,  [] pain in feet when walking, [] pain in feet when laying flat, [] history of blood clot in veins (DVT), [] history of phlebitis, [] swelling in legs, [] varicose   veins  PULMONARY:  [] productive cough, [] asthma, [] wheezing  NEUROLOGIC:  [] weakness in arms or legs, [] numbness in arms or legs, [] difficulty speaking or slurred speech, [] temporary loss of vision in one eye, [] dizziness  HEMATOLOGIC:  [] bleeding problems, [] problems with blood clotting too easily  MUSCULOSKEL:  [] joint pain, [] joint swelling  GASTROINTEST:  [] vomiting blood, [] blood in stool     GENITOURINARY:  [] burning with urination, [] blood in urine, [x] esrd-hd  PSYCHIATRIC:  [] history of major depression  INTEGUMENTARY:  [] rashes, [] ulcers  For VQI Use Only  PRE-ADM LIVING: Home  AMB STATUS: Ambulatory  Physical Examination Filed Vitals:   06/19/13 1630  BP: 149/80  Pulse: 85  Temp: 98.2 F (36.8 C)  Resp: 16   PULM: CTAB, sym exp, good air  movement  CV: RRR, Nl S2, S2, no M/R/T/G  LUE: Incision is healed, staples in place, with thrombosed distal fistula segment, skin feels warm, hand grip is 5/5, sensation in digits is intact, no palpable thrill, bruit can no be auscultated   Medical Decision Making  [name] is a 79 y.o. year old female who presents s/p Ligation of L BC AVF with excision of ulcerated segment.  The patient's next access option would be a R BC AVF.  She is reluctant to proceed at this point.    Her son is going to try to convince her to proceed. Risk, benefits, and alternatives to access surgery were discussed.  The patient is aware the risks include but are not limited to: bleeding, infection, steal syndrome, nerve damage, ischemic monomelic neuropathy, failure to mature, need for additional procedures, death and stroke.   The pt and family will let us know if they agree to proceed.  Thank you for allowing us to participate in this patient's care.  Marylene Masek, MD [facility] [place] Office: [phone] Pager: [phone]  06/19/2013, 5:25 PM    

## 2013-07-07 NOTE — Transfer of Care (Signed)
Immediate Anesthesia Transfer of Care Note  Patient: [name]  Procedure(s) Performed: Procedure(s): ARTERIOVENOUS (AV) FISTULA CREATION - RIGHT BRACHIAL CEPHALIC  (Right)  Patient Location: PACU  Anesthesia Type:General  Level of Consciousness: awake and alert   Airway & Oxygen Therapy: Patient Spontanous Breathing and Patient connected to nasal cannula oxygen  Post-op Assessment: Report given to PACU RN, Post -op Vital signs reviewed and stable and Patient moving all extremities X 4  Post vital signs: Reviewed and stable  Complications: No apparent anesthesia complications

## 2013-07-07 NOTE — Anesthesia Preprocedure Evaluation (Addendum)
Anesthesia Evaluation  Patient identified by MRN, date of birth, ID band Patient awake    Reviewed: Allergy & Precautions, H&P , NPO status , Patient's Chart, lab work & pertinent test results  Airway Mallampati: I TM Distance: >3 FB Neck ROM: Full    Dental  (+) Dental Advisory Given and Teeth Intact   Pulmonary          Cardiovascular     Neuro/Psych PSYCHIATRIC DISORDERS (dementia)    GI/Hepatic   Endo/Other    Renal/GU Dialysis and ESRFRenal disease     Musculoskeletal   Abdominal   Peds  Hematology   Anesthesia Other Findings   Reproductive/Obstetrics                          Anesthesia Physical Anesthesia Plan  ASA: III  Anesthesia Plan: General   Post-op Pain Management:    Induction: Intravenous  Airway Management Planned: LMA  Additional Equipment:   Intra-op Plan:   Post-operative Plan: Extubation in OR  Informed Consent: I have reviewed the patients History and Physical, chart, labs and discussed the procedure including the risks, benefits and alternatives for the proposed anesthesia with the patient or authorized representative who has indicated his/her understanding and acceptance.   Dental advisory given  Plan Discussed with: Surgeon and CRNA  Anesthesia Plan Comments:        Anesthesia Quick Evaluation

## 2013-07-07 NOTE — Interval H&P Note (Signed)
History and Physical Interval Note:  07/07/2013 7:12 AM  [name]  has presented today for surgery, with the diagnosis of End Stage Renal Disease  The various methods of treatment have been discussed with the patient and family. After consideration of risks, benefits and other options for treatment, the patient has consented to  Procedure(s): ARTERIOVENOUS (AV) FISTULA CREATION - RIGHT BRACHIAL CEPHALIC  (Right) as a surgical intervention .  The patient's history has been reviewed, patient examined, no change in status, stable for surgery.  I have reviewed the patient's chart and labs.  Questions were answered to the patient's satisfaction.     [name] LIANG-YU

## 2013-07-07 NOTE — Op Note (Signed)
OPERATIVE NOTE   PROCEDURE: right brachiocephalic arteriovenous fistula placement  PRE-OPERATIVE DIAGNOSIS: end stage renal disease   POST-OPERATIVE DIAGNOSIS: same as above   SURGEON: Leonides Sake, MD  ANESTHESIA: general  ESTIMATED BLOOD LOSS: 50 cc  FINDING(S): 1.  Faintly Palpable thrill in brachiocephalic arteriovenous fistula at end of case with intermittent pulsatile character 2.  Dopplerable radial signal at end of case  SPECIMEN(S):  none  INDICATIONS:   [name] is a 79 y.o. female who presents with end stage renal disease.  The patient is scheduled for right brachiocephalic arteriovenous fistula placement.  The patient is aware the risks include but are not limited to: bleeding, infection, steal syndrome, nerve damage, ischemic monomelic neuropathy, failure to mature, and need for additional procedures.  The patient is aware of the risks of the procedure and elects to proceed forward.  DESCRIPTION: After full informed written consent was obtained from the patient, the patient was brought back to the operating room and placed supine upon the operating table.  Prior to induction, the patient received IV antibiotics.   After obtaining adequate anesthesia, the patient was then prepped and draped in the standard fashion for a right arm access procedure.  I turned my attention first to identifying the patient's cephalic vein and brachial artery.  Using SonoSite guidance, the location of these vessels were marked out on the skin.   I made a transverse incision at the level of the antecubitum and dissected through the subcutaneous tissue and fascia to gain exposure of the brachial artery.  This was noted to be 4 mm in diameter externally.  This was dissected out proximally and distally and controlled with vessel loops .  I then dissected out the cephalic vein.  This was noted to be 3-3.5 mm in diameter externally.  The distal segment of the vein was ligated with a  2-0 silk, and the  vein was transected.  The proximal segment was iinterrogated with serial dilators.  The vein accepted up to a 4 mm dilator without any difficulty.  I then instilled the heparinized saline into the vein and clamped it.  At this point, I reset my exposure of the brachial artery and placed the artery under tension proximally and distally.  I made an arteriotomy with a #11 blade, and then I extended the arteriotomy with a Potts scissor.  I injected heparinized saline proximal and distal to this arteriotomy.  The vein was then sewn to the artery in an end-to-side configuration with a running stitch of 7-0 Prolene.  Prior to completing this anastomosis, I allowed the vein and artery to backbleed.  There was no evidence of clot from any vessels.  I completed the anastomosis in the usual fashion and then released all vessel loops and clamps.  There was a faintly palpable thrill in the venous outflow, and there was a dopplerable radial signal.  At this point, I irrigated out the surgical wound.  There was no further active bleeding.  The subcutaneous tissue was reapproximated with a running stitch of 3-0 Vicryl.  The skin was then reapproximated with a running subcuticular stitch of 4-0 Vicryl.  The skin was then cleaned, dried, and reinforced with Dermabond.  The patient tolerated this procedure well.   COMPLICATIONS: none  CONDITION: stable  Leonides Sake, MD [facility] [place] Office: [phone] Pager: [phone]  07/07/2013, 9:01 AM

## 2013-07-07 NOTE — Telephone Encounter (Addendum)
Pt's voicmail not set up, could not contact pt to confirm 9/26 appt @ 8:30, sent appt leter  Message copied by Jena Gauss on Tue Jul 07, 2013  4:39 PM ------      Message from: Melene Plan      Created: Tue Jul 07, 2013 10:20 AM                   ----- Message -----         From: Fransisco Hertz, MD         Sent: 07/07/2013   9:03 AM           To: [name], Melene Plan, RN            [name]      [id number]      26-Jul-1931            Procedure: R RC AVF            Follow-up: 4-6 weeks ------

## 2013-07-08 ENCOUNTER — Encounter (HOSPITAL_COMMUNITY): Payer: Self-pay | Admitting: Vascular Surgery

## 2013-08-14 ENCOUNTER — Ambulatory Visit: Payer: Medicare HMO | Admitting: Vascular Surgery

## 2013-08-20 ENCOUNTER — Encounter: Payer: Self-pay | Admitting: Vascular Surgery

## 2013-08-21 ENCOUNTER — Ambulatory Visit (INDEPENDENT_AMBULATORY_CARE_PROVIDER_SITE_OTHER): Payer: Medicare HMO | Admitting: Vascular Surgery

## 2013-08-21 ENCOUNTER — Encounter: Payer: Self-pay | Admitting: Vascular Surgery

## 2013-08-21 VITALS — BP 182/107 | HR 88 | Resp 16 | Ht 63.0 in | Wt 141.0 lb

## 2013-08-21 DIAGNOSIS — N186 End stage renal disease: Secondary | ICD-10-CM

## 2013-08-21 NOTE — Progress Notes (Signed)
[facility] [place]  Postoperative Access Visit  History of Present Illness  [name] is a 79 y.o. year old female who presents for postoperative follow-up for: R BC AVF (Date: 07/07/13).  The patient's wounds are healed.  The patient notes no steal symptoms.  The patient is able to complete their activities of daily living.  The patient's current symptoms are: none.  Flow rates via RIJ TDC are dropping by report.  For VQI Use Only  PRE-ADM LIVING: Home  AMB STATUS: Ambulatory  Physical Examination Filed Vitals:   08/21/13 1602  BP: 182/107  Pulse: 88  Resp: 16   RUE: Incision is healed, skin feels warm, hand grip is 5/5, sensation in digits is intact, palpable thrill, bruit can  be auscultated , on sonosite: distal 2/3 is >6 mm and < 6 mm deep, proximal 1/3 ~5 mm  Medical Decision Making  [name] is a 79 y.o. year old female who presents s/p R BC AVF.   Given the decreasing flow rates via RIJ TDC, it is probably reasonable to go ahead and attempt cannulation of the R BC AVF.  If not successful, would try again in one month.  The patient's tunneled dialysis catheter can be removed after two successful cannulations and completed dialysis treatments.  Thank you for allowing [place] to participate in this patient's care.  Leonides Sake, MD [facility] [place] Office: [phone] Pager: [phone]  08/21/2013, 4:41 PM

## 2013-09-04 ENCOUNTER — Other Ambulatory Visit: Payer: Self-pay

## 2013-09-10 ENCOUNTER — Encounter (HOSPITAL_COMMUNITY): Payer: Self-pay | Admitting: Pharmacy Technician

## 2013-09-10 ENCOUNTER — Ambulatory Visit (HOSPITAL_COMMUNITY)
Admission: RE | Admit: 2013-09-10 | Discharge: 2013-09-10 | Disposition: A | Discharge status: Home / Self Care | Payer: Medicare HMO | Source: Ambulatory Visit | Attending: Vascular Surgery | Admitting: Vascular Surgery

## 2013-09-10 ENCOUNTER — Encounter (HOSPITAL_COMMUNITY)
Admission: RE | Disposition: A | Discharge status: Home / Self Care | Payer: Self-pay | Source: Ambulatory Visit | Attending: Vascular Surgery

## 2013-09-10 DIAGNOSIS — Y832 Surgical operation with anastomosis, bypass or graft as the cause of abnormal reaction of the patient, or of later complication, without mention of misadventure at the time of the procedure: Secondary | ICD-10-CM | POA: Insufficient documentation

## 2013-09-10 DIAGNOSIS — F0390 Unspecified dementia without behavioral disturbance: Secondary | ICD-10-CM | POA: Insufficient documentation

## 2013-09-10 DIAGNOSIS — I871 Compression of vein: Secondary | ICD-10-CM | POA: Insufficient documentation

## 2013-09-10 DIAGNOSIS — Z79899 Other long term (current) drug therapy: Secondary | ICD-10-CM | POA: Insufficient documentation

## 2013-09-10 DIAGNOSIS — T82898A Other specified complication of vascular prosthetic devices, implants and grafts, initial encounter: Secondary | ICD-10-CM | POA: Insufficient documentation

## 2013-09-10 DIAGNOSIS — Z992 Dependence on renal dialysis: Secondary | ICD-10-CM | POA: Insufficient documentation

## 2013-09-10 HISTORY — PX: SHUNTOGRAM: SHX5491

## 2013-09-10 LAB — POCT I-STAT, CHEM 8
BUN: 50 mg/dL — ABNORMAL HIGH (ref 6–23)
Calcium, Ion: 1.13 mmol/L (ref 1.13–1.30)
Creatinine, Ser: 4.6 mg/dL — ABNORMAL HIGH (ref 0.50–1.10)
Glucose, Bld: 88 mg/dL (ref 70–99)
Hemoglobin: 12.9 g/dL (ref 12.0–15.0)
TCO2: 28 mmol/L (ref 0–100)

## 2013-09-10 SURGERY — ASSESSMENT, SHUNT FUNCTION, WITH CONTRAST RADIOGRAPHIC STUDY
Anesthesia: LOCAL | Laterality: Right

## 2013-09-10 MED ORDER — ONDANSETRON HCL 4 MG/2ML IJ SOLN: 4.0000 mg | Freq: Four times a day (QID) | INTRAMUSCULAR | Status: DC | PRN

## 2013-09-10 MED ORDER — SODIUM CHLORIDE 0.9 % IJ SOLN: 3.0000 mL | Freq: Two times a day (BID) | INTRAMUSCULAR | Status: DC

## 2013-09-10 MED ORDER — LIDOCAINE HCL (PF) 1 % IJ SOLN
INTRAMUSCULAR | Status: AC
  Filled 2013-09-10: qty 30

## 2013-09-10 MED ORDER — ACETAMINOPHEN 325 MG PO TABS: 650.0000 mg | ORAL_TABLET | ORAL | Status: DC | PRN

## 2013-09-10 MED ORDER — SODIUM CHLORIDE 0.9 % IV SOLN: 250.0000 mL | INTRAVENOUS | Status: DC | PRN

## 2013-09-10 MED ORDER — SODIUM CHLORIDE 0.9 % IJ SOLN: 3.0000 mL | INTRAMUSCULAR | Status: DC | PRN

## 2013-09-10 NOTE — Op Note (Signed)
OPERATIVE NOTE   PROCEDURE: 1. right brachiocephalic arteriovenous fistula cannulation under ultrasound guidance 2. right arm fistulogram  PRE-OPERATIVE DIAGNOSIS: Difficulty cannulating right brachiocephalic arteriovenous fistula  POST-OPERATIVE DIAGNOSIS: same as above   SURGEON: Leonides Sake, MD  ANESTHESIA: local  ESTIMATED BLOOD LOSS: 5 cc  FINDING(S): 1. Large hematoma deep to the fistula near the anastomosis 2. Widely patent fistula with large side branch distally feeding basilic vein via loop 3. Widely patent right axillary and subclavian vein 4. Non-hemodynamic stenosis of right innominate vein 5. Widely patent superior vena cava   SPECIMEN(S):  None  CONTRAST: 20 cc  INDICATIONS: [name] is a 79 y.o. female who  presents with difficulty cannulating her right brachiocephalic arteriovenous fistula    The patient is scheduled for right arm fistulogram.  The patient is aware the risks include but are not limited to: bleeding, infection, thrombosis of the cannulated access, and possible anaphylactic reaction to the contrast.  The patient is aware of the risks of the procedure and elects to proceed forward.  DESCRIPTION: After full informed written consent was obtained, the patient was brought back to the angiography suite and placed supine upon the angiography table.  The patient was connected to monitoring equipment.  The right arm was prepped and draped in the standard fashion for a right arm fistulogram.  Under ultrasound guidance, the right brachiocephalic arteriovenous fistula was cannulated with a micropuncture needle.  Under Sonosite guidance, a large hematoma was visualized deep to the fistula.  The microwire was advanced into the fistula and the needle was exchanged for the a microsheath, which was lodged 2 cm into the access.  The wire was removed and the sheath was connected to the IV extension tubing.  Hand injections were completed to image the access from the  antecubitum up to the level of axilla.  The central venous structures were also imaged by hand injections.  Based on the images, this patient will need: possible side branch ligation and superficialization.  A 4-0 Monocryl purse-string suture was sewn around the sheath.  The sheath was removed while tying down the suture.  A sterile bandage was applied to the puncture site.  During this procedure the patient demonstrated significant cognitive impairment from her dementia, I am not certain that she will be able to consistently cooperate with cannulation.  If she continues to compete with her care during cannulation, it may be dangerous to continue hemodialysis with the fistula.  If nephrology feels she is capable of long-term hemodialysis, then it might be reasonable to consider ligation of the large distal side-branch and superficializing this fistula.  I will defer to them this determination.  COMPLICATIONS: none  CONDITION: stable  Leonides Sake, MD [facility] [place] Office: [phone] Pager: [phone]  09/10/2013 12:11 PM

## 2013-09-10 NOTE — H&P (Addendum)
[facility] [place]  Brief History and Physical  History of Present Illness  [name] is a 79 y.o. female who presents with chief complaint: difficulty cannulating R BC AVF.  The patient presents today for R arm fistulogram.  By report, pt was twitching on cannulation resulting in large hematoma.  Past Medical History  Diagnosis Date  . Renal disorder   . Shortness of breath     with exertion  . Pneumonia 05/2013  . Mental disorder     dementia - short term memory  . History of blood transfusion     after fistula ruptured    Past Surgical History  Procedure Laterality Date  . Dialysis fistula creation    . Ligation of arteriovenous  fistula Left 06/04/2013    Procedure: LIGATION OF ARTERIOVENOUS  FISTULA;  Surgeon: Fransisco Hertz, MD;  Location: [facility] OR;  Service: Vascular;  Laterality: Left;  . Insertion of dialysis catheter Right 06/04/2013    Procedure: INSERTION OF DIALYSIS CATHETER;  Surgeon: Fransisco Hertz, MD;  Location: [facility] OR;  Service: Vascular;  Laterality: Right;  . Cholecystectomy  1992  . Av fistula placement Right 07/07/2013    Procedure: ARTERIOVENOUS (AV) FISTULA CREATION - RIGHT BRACHIAL CEPHALIC ;  Surgeon: Fransisco Hertz, MD;  Location: [facility] OR;  Service: Vascular;  Laterality: Right;    History   Social History  . Marital Status: Divorced    Spouse Name: N/A    Number of Children: N/A  . Years of Education: N/A   Occupational History  . Not on file.   Social History Main Topics  . Smoking status: Never Smoker   . Smokeless tobacco: Never Used  . Alcohol Use: No  . Drug Use: No  . Sexual Activity: No   Other Topics Concern  . Not on file   Social History Narrative  . No narrative on file    Family History  Problem Relation Age of Onset  . Family history unknown: Yes    No current facility-administered medications on file prior to encounter.   Current Outpatient Prescriptions on File Prior to Encounter  Medication Sig  Dispense Refill  . dextrose 5 % SOLN 50 mL with ceFEPIme 2 G SOLR 2 g Inject 2 g into the vein every Monday, Wednesday, and Friday with hemodialysis.  5 ampule  0  . donepezil (ARICEPT) 10 MG tablet Take 10 mg by mouth at bedtime.      . ferrous sulfate 325 (65 FE) MG tablet Take 1 tablet (325 mg total) by mouth 2 (two) times daily with a meal.  60 tablet  0  . memantine (NAMENDA) 10 MG tablet Take 10 mg by mouth 2 (two) times daily. For memory      . multivitamin (RENA-VIT) TABS tablet Take 1 tablet by mouth daily.      . Nutritional Supplements (FEEDING SUPPLEMENT, NEPRO CARB STEADY,) LIQD Take 237 mLs by mouth 3 (three) times daily as needed (Supplement).    0  . oxyCODONE (ROXICODONE) 5 MG immediate release tablet Take 1 tablet (5 mg total) by mouth every 4 (four) hours as needed for pain.  30 tablet  0  . torsemide (DEMADEX) 20 MG tablet Take 20 mg by mouth daily.      . Vancomycin (VANCOCIN) 750 MG/150ML SOLN Inject 150 mLs (750 mg total) into the vein every Monday, Wednesday, and Friday with hemodialysis.  750 mL  0    Allergies  Allergen Reactions  .  Sulfa Antibiotics Rash and Other (See Comments)    unknown  . Sulfur Rash    Review of Systems: As listed above, otherwise negative.  Physical Examination  Filed Vitals:   09/10/13 1046  BP: 126/66  Pulse: 78  Temp: 97.7 F (36.5 C)  TempSrc: Oral  Resp: 18  Height: 5\' 3"  (1.6 m)  Weight: 140 lb (63.504 kg)  SpO2: 96%    General: A&O x 3, WDWN  Pulmonary: Sym exp, good air movt, CTAB, no rales, rhonchi, & wheezing  Cardiac: RRR, Nl S1, S2, no Murmurs, rubs or gallops  Gastrointestinal: soft, NTND, -G/R, - HSM, - masses, - CVAT B  Musculoskeletal: M/S 5/5 throughout , Extremities without ischemic changes , R fistula with palpable thrill and bruit, large hematoma with echymosis near antecubitum  Laboratory See iStat  Medical Decision Making  [name] is a 79 y.o. female who presents with: R BC AVF with  difficulty cannulating.   The patient is scheduled for: R arm fistulogram I discussed with the patient the nature of angiographic procedures, especially the limited patencies of any endovascular intervention.  The patient is aware of that the risks of an angiographic procedure include but are not limited to: bleeding, infection, access site complications, renal failure, embolization, rupture of vessel, dissection, possible need for emergent surgical intervention, possible need for surgical procedures to treat the patient's pathology, and stroke and death.    The patient is aware of the risks and agrees to proceed.  Leonides Sake, MD [facility] [place] Office: [phone] Pager: [phone]  09/10/2013, 9:38 AM

## 2013-09-10 NOTE — Progress Notes (Signed)
Pt discharged per MD order.  CG able  To verbalize understanding .  Pt to car vehicle via wheelchair.

## 2013-10-14 ENCOUNTER — Telehealth: Payer: Self-pay

## 2013-10-14 NOTE — Telephone Encounter (Signed)
Rec'd call 11/21 from [place] at [facility] to inquire when pt's procedure for Superficialization and side branch ligation will be scheduled.  Stated that the Nephrologists wants pt. To proceed with this procedure.  Discussed w/ Dr. Imogene Burn.  Stated that pt. Has Dementia, and the family has to be in agreement with proceeding to give consent for the surgery.  Phone call to [facility] in [place].  Spoke with pt's nurse.  Stated that pt's son, Onalee Hua has POA, and would be the one to schedule the procedure with.  Will schedule appt. for pt. and son to come to office, to discuss procedure.

## 2013-11-05 ENCOUNTER — Encounter: Payer: Self-pay | Admitting: Vascular Surgery

## 2013-11-06 ENCOUNTER — Encounter: Payer: Self-pay | Admitting: Vascular Surgery

## 2013-11-06 ENCOUNTER — Other Ambulatory Visit: Payer: Self-pay

## 2013-11-06 ENCOUNTER — Encounter (HOSPITAL_COMMUNITY): Payer: Self-pay | Admitting: Pharmacy Technician

## 2013-11-06 ENCOUNTER — Ambulatory Visit (INDEPENDENT_AMBULATORY_CARE_PROVIDER_SITE_OTHER): Payer: Medicare HMO | Admitting: Vascular Surgery

## 2013-11-06 ENCOUNTER — Encounter (HOSPITAL_COMMUNITY): Payer: Self-pay | Admitting: *Deleted

## 2013-11-06 VITALS — BP 138/65 | HR 81 | Ht 63.0 in | Wt 143.2 lb

## 2013-11-06 DIAGNOSIS — M7989 Other specified soft tissue disorders: Secondary | ICD-10-CM

## 2013-11-06 DIAGNOSIS — N186 End stage renal disease: Secondary | ICD-10-CM

## 2013-11-06 DIAGNOSIS — T82898A Other specified complication of vascular prosthetic devices, implants and grafts, initial encounter: Secondary | ICD-10-CM

## 2013-11-06 NOTE — Progress Notes (Signed)
Established Dialysis Access  History of Present Illness  [name] is a 79 y.o. (Mar 14, 1931) female who presents for re-evaluation for permanent access.  Since her fistulogram, she has had increased R arm swelling. Her nephrologist has not started cannulating the R [facility] AVF  Past Medical History  Diagnosis Date  . Renal disorder   . Shortness of breath     with exertion  . Pneumonia 05/2013  . Mental disorder     dementia - short term memory  . History of blood transfusion     after fistula ruptured    Past Surgical History  Procedure Laterality Date  . Dialysis fistula creation    . Ligation of arteriovenous  fistula Left 06/04/2013    Procedure: LIGATION OF ARTERIOVENOUS  FISTULA;  Surgeon: Fransisco Hertz, MD;  Location: [facility] OR;  Service: Vascular;  Laterality: Left;  . Insertion of dialysis catheter Right 06/04/2013    Procedure: INSERTION OF DIALYSIS CATHETER;  Surgeon: Fransisco Hertz, MD;  Location: [facility] OR;  Service: Vascular;  Laterality: Right;  . Cholecystectomy  1992  . Av fistula placement Right 07/07/2013    Procedure: ARTERIOVENOUS (AV) FISTULA CREATION - RIGHT BRACHIAL CEPHALIC ;  Surgeon: Fransisco Hertz, MD;  Location: [facility] OR;  Service: Vascular;  Laterality: Right;    History   Social History  . Marital Status: Divorced    Spouse Name: N/A    Number of Children: N/A  . Years of Education: N/A   Occupational History  . Not on file.   Social History Main Topics  . Smoking status: Never Smoker   . Smokeless tobacco: Never Used  . Alcohol Use: No  . Drug Use: No  . Sexual Activity: No   Other Topics Concern  . Not on file   Social History Narrative  . No narrative on file   Fam History: unknown, pt has limited memory  Current Outpatient Prescriptions on File Prior to Visit  Medication Sig Dispense Refill  . diazepam (VALIUM) 2 MG tablet Take 2 mg by mouth every 6 (six) hours as needed for anxiety (on dialysis days).      . donepezil (ARICEPT) 10 MG  tablet Take 10 mg by mouth at bedtime.      . ferrous sulfate 325 (65 FE) MG tablet Take 1 tablet (325 mg total) by mouth 2 (two) times daily with a meal.  60 tablet  0  . lidocaine-prilocaine (EMLA) cream Apply 1 application topically as needed (to arm for numbing).      . memantine (NAMENDA) 10 MG tablet Take 10 mg by mouth 2 (two) times daily. For memory      . multivitamin (RENA-VIT) TABS tablet Take 1 tablet by mouth daily.      . sevelamer carbonate (RENVELA) 800 MG tablet Take 800 mg by mouth 3 (three) times daily with meals.      . torsemide (DEMADEX) 20 MG tablet Take 20 mg by mouth daily.       No current facility-administered medications on file prior to visit.    Allergies  Allergen Reactions  . Sulfa Antibiotics Nausea And Vomiting and Rash  . Sulfur Nausea And Vomiting and Rash    REVIEW OF SYSTEMS:  (Positives checked otherwise negative)  CARDIOVASCULAR:  []  chest pain, []  chest pressure, []  palpitations, []  shortness of breath when laying flat, []  shortness of breath with exertion,  []  pain in feet when walking, []  pain in feet when laying flat, []   history of blood clot in veins (DVT), []  history of phlebitis, []  swelling in legs, []  varicose veins  PULMONARY:  []  productive cough, []  asthma, []  wheezing  NEUROLOGIC:  []  weakness in arms or legs, []  numbness in arms or legs, []  difficulty speaking or slurred speech, []  temporary loss of vision in one eye, []  dizziness  HEMATOLOGIC:  []  bleeding problems, []  problems with blood clotting too easily  MUSCULOSKEL:  []  joint pain, []  joint swelling  GASTROINTEST:  []  vomiting blood, []  blood in stool     GENITOURINARY:  []  burning with urination, []  blood in urine  PSYCHIATRIC:  []  history of major depression  INTEGUMENTARY:  []  rashes, []  ulcers     Physical Examination  Filed Vitals:   11/06/13 0845  BP: 138/65  Pulse: 81  Height: 5\' 3"  (1.6 m)  Weight: 143 lb 3.2 oz (64.955 kg)  SpO2: 100%   Body mass  index is 25.37 kg/(m^2).  General: A&O x 3, WD, WN  Pulmonary: Sym exp, good air movt, CTAB, no rales, rhonchi, & wheezing  Cardiac: RRR, Nl S1, S2, no Murmurs, rubs or gallops, RIJ TDC in place  Vascular: palpable R brachial, no ulnar or radial pulse, strongly palpable thrill in access, strong bruit in access  Gastrointestinal: soft, NTND, -G/R, - HSM, - masses, - CVAT B  Musculoskeletal: M/S 5/5 throughout , Extremities without  ischemic changes , Swollen R arm  Neurologic: Pain and light touch intact in extremities , Motor exam as listed above  Medical Decision Making  [name] is a 79 y.o. female who presents with ESRD requiring hemodialysis, RIJ TDC is causing obstruction of R innominate causing R arm swelling.   I would move the [facility] to L IJV and remove RIJ TDC to relieve outflow obstruction.  Once the patient's arm swelling has improved, I would start cannulating the R BC AVF.    The patient's son  is aware the risks of tunneled dialysis catheter placement include but are not limited to: bleeding, infection, central venous injury, pneumothorax, possible venous stenosis, possible malpositioning in the venous system, and possible infections related to long-term catheter presence.   The patient and family are aware of these risks and agreed to proceed which is scheduled for 22 DEC 14.  Leonides Sake, MD [facility] [place] Office: [phone] Pager: [phone]  11/06/2013, 9:10 AM

## 2013-11-08 MED ORDER — DEXTROSE 5 % IV SOLN
1.5000 g | INTRAVENOUS | Status: AC
  Administered 2013-11-09: 1.5 g via INTRAVENOUS
  Filled 2013-11-08: qty 1.5

## 2013-11-09 ENCOUNTER — Encounter (HOSPITAL_COMMUNITY)
Admission: RE | Disposition: A | Discharge status: Home / Self Care | Payer: Self-pay | Source: Ambulatory Visit | Attending: Vascular Surgery

## 2013-11-09 ENCOUNTER — Ambulatory Visit (HOSPITAL_COMMUNITY)
Admission: RE | Admit: 2013-11-09 | Discharge: 2013-11-09 | Disposition: A | Discharge status: Home / Self Care | Payer: Medicare HMO | Source: Ambulatory Visit | Attending: Vascular Surgery | Admitting: Vascular Surgery

## 2013-11-09 ENCOUNTER — Ambulatory Visit (HOSPITAL_COMMUNITY): Payer: Medicare HMO | Admitting: Certified Registered Nurse Anesthetist

## 2013-11-09 ENCOUNTER — Encounter (HOSPITAL_COMMUNITY): Payer: Self-pay | Admitting: Certified Registered Nurse Anesthetist

## 2013-11-09 ENCOUNTER — Encounter (HOSPITAL_COMMUNITY): Payer: Medicare HMO | Admitting: Certified Registered Nurse Anesthetist

## 2013-11-09 ENCOUNTER — Ambulatory Visit (HOSPITAL_COMMUNITY): Payer: Medicare HMO

## 2013-11-09 DIAGNOSIS — N186 End stage renal disease: Secondary | ICD-10-CM | POA: Insufficient documentation

## 2013-11-09 DIAGNOSIS — Z452 Encounter for adjustment and management of vascular access device: Secondary | ICD-10-CM | POA: Insufficient documentation

## 2013-11-09 DIAGNOSIS — R0602 Shortness of breath: Secondary | ICD-10-CM | POA: Insufficient documentation

## 2013-11-09 DIAGNOSIS — N289 Disorder of kidney and ureter, unspecified: Secondary | ICD-10-CM

## 2013-11-09 HISTORY — PX: REMOVAL OF A DIALYSIS CATHETER: SHX6053

## 2013-11-09 HISTORY — DX: Unspecified dementia, unspecified severity, without behavioral disturbance, psychotic disturbance, mood disturbance, and anxiety: F03.90

## 2013-11-09 HISTORY — PX: INSERTION OF DIALYSIS CATHETER: SHX1324

## 2013-11-09 LAB — POCT I-STAT 4, (NA,K, GLUC, HGB,HCT)
Glucose, Bld: 83 mg/dL (ref 70–99)
HCT: 36 % (ref 36.0–46.0)

## 2013-11-09 SURGERY — REMOVAL, DIALYSIS CATHETER
Anesthesia: Monitor Anesthesia Care | Laterality: Right

## 2013-11-09 MED ORDER — HEPARIN SODIUM (PORCINE) 1000 UNIT/ML IJ SOLN
INTRAMUSCULAR | Status: DC | PRN
  Administered 2013-11-09: 5 mL

## 2013-11-09 MED ORDER — HEPARIN SODIUM (PORCINE) 1000 UNIT/ML IJ SOLN
INTRAMUSCULAR | Status: AC
  Filled 2013-11-09: qty 1

## 2013-11-09 MED ORDER — LIDOCAINE-EPINEPHRINE (PF) 1 %-1:200000 IJ SOLN
INTRAMUSCULAR | Status: DC | PRN
  Administered 2013-11-09: 10 mL

## 2013-11-09 MED ORDER — OXYCODONE HCL 5 MG PO TABS: 5.0000 mg | ORAL_TABLET | ORAL | Status: DC | PRN

## 2013-11-09 MED ORDER — PROPOFOL INFUSION 10 MG/ML OPTIME
INTRAVENOUS | Status: DC | PRN
  Administered 2013-11-09: 100 ug/min via INTRAVENOUS

## 2013-11-09 MED ORDER — FENTANYL CITRATE 0.05 MG/ML IJ SOLN
INTRAMUSCULAR | Status: DC | PRN
  Administered 2013-11-09 (×2): 25 ug via INTRAVENOUS

## 2013-11-09 MED ORDER — SODIUM CHLORIDE 0.9 % IR SOLN
Status: DC | PRN
  Administered 2013-11-09: 08:00:00

## 2013-11-09 MED ORDER — LIDOCAINE-EPINEPHRINE (PF) 1 %-1:200000 IJ SOLN
INTRAMUSCULAR | Status: AC
  Filled 2013-11-09: qty 10

## 2013-11-09 MED ORDER — ONDANSETRON HCL 4 MG/2ML IJ SOLN
INTRAMUSCULAR | Status: DC | PRN
  Administered 2013-11-09: 4 mg via INTRAVENOUS

## 2013-11-09 MED ORDER — SODIUM CHLORIDE 0.9 % IV SOLN
INTRAVENOUS | Status: DC
  Administered 2013-11-09: 07:00:00 via INTRAVENOUS

## 2013-11-09 MED ORDER — HYDROMORPHONE HCL PF 1 MG/ML IJ SOLN: 0.2500 mg | INTRAMUSCULAR | Status: DC | PRN

## 2013-11-09 SURGICAL SUPPLY — 48 items
BAG DECANTER FOR FLEXI CONT (MISCELLANEOUS) ×3 IMPLANT
BLADE SURG 15 STRL LF DISP TIS (BLADE) ×2 IMPLANT
BLADE SURG 15 STRL SS (BLADE) ×1
CATH CANNON HEMO 15F 50CM (CATHETERS) IMPLANT
CATH CANNON HEMO 15FR 19 (HEMODIALYSIS SUPPLIES) IMPLANT
CATH CANNON HEMO 15FR 23CM (HEMODIALYSIS SUPPLIES) IMPLANT
CATH CANNON HEMO 15FR 31CM (HEMODIALYSIS SUPPLIES) IMPLANT
CATH CANNON HEMO 15FR 32CM (HEMODIALYSIS SUPPLIES) ×3 IMPLANT
CATH STRAIGHT 5FR 65CM (CATHETERS) ×3 IMPLANT
COVER PROBE W GEL 5X96 (DRAPES) ×3 IMPLANT
COVER SURGICAL LIGHT HANDLE (MISCELLANEOUS) ×3 IMPLANT
DERMABOND ADVANCED (GAUZE/BANDAGES/DRESSINGS) ×1
DERMABOND ADVANCED .7 DNX12 (GAUZE/BANDAGES/DRESSINGS) ×2 IMPLANT
DRAPE C-ARM 42X72 X-RAY (DRAPES) ×3 IMPLANT
DRAPE CHEST BREAST 15X10 FENES (DRAPES) ×3 IMPLANT
GAUZE SPONGE 2X2 8PLY STRL LF (GAUZE/BANDAGES/DRESSINGS) ×4 IMPLANT
GAUZE SPONGE 4X4 16PLY XRAY LF (GAUZE/BANDAGES/DRESSINGS) ×3 IMPLANT
GLOVE BIO SURGEON STRL SZ7 (GLOVE) ×3 IMPLANT
GLOVE BIOGEL PI IND STRL 6.5 (GLOVE) ×2 IMPLANT
GLOVE BIOGEL PI IND STRL 7.5 (GLOVE) ×4 IMPLANT
GLOVE BIOGEL PI INDICATOR 6.5 (GLOVE) ×1
GLOVE BIOGEL PI INDICATOR 7.5 (GLOVE) ×2
GLOVE SURG SS PI 7.5 STRL IVOR (GLOVE) ×3 IMPLANT
GOWN STRL NON-REIN LRG LVL3 (GOWN DISPOSABLE) ×9 IMPLANT
GOWN STRL REIN XL XLG (GOWN DISPOSABLE) ×6 IMPLANT
KIT BASIN OR (CUSTOM PROCEDURE TRAY) ×3 IMPLANT
KIT ROOM TURNOVER OR (KITS) ×3 IMPLANT
NEEDLE 18GX1X1/2 (RX/OR ONLY) (NEEDLE) ×3 IMPLANT
NEEDLE HYPO 25GX1X1/2 BEV (NEEDLE) ×3 IMPLANT
NS IRRIG 1000ML POUR BTL (IV SOLUTION) ×3 IMPLANT
PACK SURGICAL SETUP 50X90 (CUSTOM PROCEDURE TRAY) ×3 IMPLANT
PAD ARMBOARD 7.5X6 YLW CONV (MISCELLANEOUS) ×6 IMPLANT
SET MICROPUNCTURE 5F STIFF (MISCELLANEOUS) IMPLANT
SOAP 2 % CHG 4 OZ (WOUND CARE) ×3 IMPLANT
SPONGE GAUZE 2X2 STER 10/PKG (GAUZE/BANDAGES/DRESSINGS) ×2
SUT ETHILON 3 0 PS 1 (SUTURE) ×3 IMPLANT
SUT MNCRL AB 4-0 PS2 18 (SUTURE) ×3 IMPLANT
SYR 20CC LL (SYRINGE) ×6 IMPLANT
SYR 30ML LL (SYRINGE) IMPLANT
SYR 3ML LL SCALE MARK (SYRINGE) ×3 IMPLANT
SYR 5ML LL (SYRINGE) ×3 IMPLANT
SYR CONTROL 10ML LL (SYRINGE) ×3 IMPLANT
SYRINGE 10CC LL (SYRINGE) ×6 IMPLANT
TAPE CLOTH SURG 4X10 WHT LF (GAUZE/BANDAGES/DRESSINGS) ×6 IMPLANT
TOWEL OR 17X24 6PK STRL BLUE (TOWEL DISPOSABLE) ×3 IMPLANT
TOWEL OR 17X26 10 PK STRL BLUE (TOWEL DISPOSABLE) ×3 IMPLANT
WATER STERILE IRR 1000ML POUR (IV SOLUTION) ×3 IMPLANT
WIRE AMPLATZ SS-J .035X180CM (WIRE) ×3 IMPLANT

## 2013-11-09 NOTE — Op Note (Signed)
OPERATIVE NOTE  PROCEDURE: 1.  Left internal jugular vein tunneled dialysis catheter placement 2.  Left internal jugular vein cannulation under ultrasound guidance 3.  Removal right internal jugular vein tunneled dialysis catheter   PRE-OPERATIVE DIAGNOSIS: end-stage renal failure  POST-OPERATIVE DIAGNOSIS: same as above  SURGEON: Leonides Sake, MD  ANESTHESIA: local and MAC  ESTIMATED BLOOD LOSS: 30 cc  FINDING(S): 1.  Tips of the catheter in the right atrium on fluoroscopy 2.  No obvious pneumothorax on fluoroscopy  SPECIMEN(S):  none  INDICATIONS:   [name] is a 79 y.o. female who presents with right arm swelling in the setting of likely venous obstruction from her right internal jugular vein tunneled dialysis catheter.  The right arm is too swollen I think for easy cannulation of her brachiocephalic arteriovenous fistula, so I recommended moving the tunneled dialysis catheter to left internal jugular vein.  The patient presents for tunneled dialysis catheter placement and right internal jugular vein tunneled dialysis catheter removal.  The patient is aware the risks of tunneled dialysis catheter placement include but are not limited to: bleeding, infection, central venous injury, pneumothorax, possible venous stenosis, possible malpositioning in the venous system, and possible infections related to long-term catheter presence.  The patient was aware of these risks and agreed to proceed.  DESCRIPTION: After written full informed consent was obtained from the patient, the patient was taken back to the operating room.  Prior to induction, the patient was given IV antibiotics.  After obtaining adequate sedation, the patient was prepped and draped in the standard fashion for a chest or neck tunneled dialysis catheter placement. I injected a mixture of local anesthetic agents at the exit site of the tunneled dialysis catheter, surrounding the cuff of the catheter.  The cuff was  dissected out bluntly and then sharply released.  Pressure was held to the right internal jugular vein for 5 minutes.  No further bleeding occurred.  The exit site was clean and a sterile dressing applied.   At this point, I turned my attention to the left neck.   I anesthesized the neck cannulation site with local anesthetic.  Under ultrasound guidance, the left internal jugular vein was cannulated with the 18 gauge needle.  A J-wire was then placed down in the right ventricle under fluroscopic guidance.  The wire was then secured in place with a clamp to the drapes.  The cannulation site, the catheter exit site, and tract for the subcutaneous tunnel were then anesthesized with a total of 20 cc of a 1:1 mixture of 0.5% Marcaine without epinepherine and 1% Lidocaine with epinepherine.  I then made stab incisions at the neck and exit sites.  I dissected from the exit site to the cannulation site with a metal tunneler.   The subcutaneous tunnel was dilated by passing a plastic dilator over the metal dissector.  The wire was then unclamped and I removed the needle.  An end-hole catheter was loaded over the wire and advanced into the right atrium.  The wire was then exchanged for an Amplatz super stiff wire.  The catheter was then removed.  Over the wire, the skin tract and venotomy was dilated serially with dilators.  Finally, the dilator-sheath was placed over the wire under fluroscopic guidance into the superior vena cava.  The dilator was removed.  A 27 cm Diatek catheter was woven over the wire and advanced under fluoroscopic guidance down into the right atrium.  The wire was then removed, and  the sheath was broken and peeled away while holding the catheter cuff at the level of the skin.  The back end of this catheter was transected, revealing the two lumens of this catheter.  The ports were docked onto these two lumens.  The catheter hub was then screwed into place.  Each port was tested by aspirating and  flushing.  No resistance was noted.  Each port was then thoroughly flushed with heparinized saline.  The catheter was secured in placed with two interrupted stitches of 3-0 Nylon tied to the catheter.  The neck incision was closed with a U-stitch of 4-0 Monocryl.  The neck and chest incisions were cleaned and sterile bandages applied.  Each port was then loaded with concentrated heparin (1000 Units/mL) at the manufacturer recommended volumes to each port.  Sterile caps were applied to each port.  On completion fluoroscopy, the tips of the catheter were in the right atrium, and there was no evidence of pneumothorax.  COMPLICATIONS: none  CONDITION: stable   Leonides Sake, MD [facility] [place] Office: [phone] Pager: [phone]  11/09/2013, 8:14 AM

## 2013-11-09 NOTE — H&P (View-Only) (Signed)
  Established Dialysis Access  History of Present Illness  [name] is a 79 y.o. (10/19/1931) female who presents for re-evaluation for permanent access.  Since her fistulogram, she has had increased R arm swelling. Her nephrologist has not started cannulating the R BC AVF  Past Medical History  Diagnosis Date  . Renal disorder   . Shortness of breath     with exertion  . Pneumonia 05/2013  . Mental disorder     dementia - short term memory  . History of blood transfusion     after fistula ruptured    Past Surgical History  Procedure Laterality Date  . Dialysis fistula creation    . Ligation of arteriovenous  fistula Left 06/04/2013    Procedure: LIGATION OF ARTERIOVENOUS  FISTULA;  Surgeon: Milos Milligan L Theodor Mustin, MD;  Location: [facility] OR;  Service: Vascular;  Laterality: Left;  . Insertion of dialysis catheter Right 06/04/2013    Procedure: INSERTION OF DIALYSIS CATHETER;  Surgeon: Thorn Demas L Lucyann Romano, MD;  Location: [facility] OR;  Service: Vascular;  Laterality: Right;  . Cholecystectomy  1992  . Av fistula placement Right 07/07/2013    Procedure: ARTERIOVENOUS (AV) FISTULA CREATION - RIGHT BRACHIAL CEPHALIC ;  Surgeon: Mahir Prabhakar L Martine Bleecker, MD;  Location: [facility] OR;  Service: Vascular;  Laterality: Right;    History   Social History  . Marital Status: Divorced    Spouse Name: N/A    Number of Children: N/A  . Years of Education: N/A   Occupational History  . Not on file.   Social History Main Topics  . Smoking status: Never Smoker   . Smokeless tobacco: Never Used  . Alcohol Use: No  . Drug Use: No  . Sexual Activity: No   Other Topics Concern  . Not on file   Social History Narrative  . No narrative on file   Fam History: unknown, pt has limited memory  Current Outpatient Prescriptions on File Prior to Visit  Medication Sig Dispense Refill  . diazepam (VALIUM) 2 MG tablet Take 2 mg by mouth every 6 (six) hours as needed for anxiety (on dialysis days).      . donepezil (ARICEPT) 10 MG  tablet Take 10 mg by mouth at bedtime.      . ferrous sulfate 325 (65 FE) MG tablet Take 1 tablet (325 mg total) by mouth 2 (two) times daily with a meal.  60 tablet  0  . lidocaine-prilocaine (EMLA) cream Apply 1 application topically as needed (to arm for numbing).      . memantine (NAMENDA) 10 MG tablet Take 10 mg by mouth 2 (two) times daily. For memory      . multivitamin (RENA-VIT) TABS tablet Take 1 tablet by mouth daily.      . sevelamer carbonate (RENVELA) 800 MG tablet Take 800 mg by mouth 3 (three) times daily with meals.      . torsemide (DEMADEX) 20 MG tablet Take 20 mg by mouth daily.       No current facility-administered medications on file prior to visit.    Allergies  Allergen Reactions  . Sulfa Antibiotics Nausea And Vomiting and Rash  . Sulfur Nausea And Vomiting and Rash    REVIEW OF SYSTEMS:  (Positives checked otherwise negative)  CARDIOVASCULAR:  [] chest pain, [] chest pressure, [] palpitations, [] shortness of breath when laying flat, [] shortness of breath with exertion,  [] pain in feet when walking, [] pain in feet when laying flat, []   history of blood clot in veins (DVT), [] history of phlebitis, [] swelling in legs, [] varicose veins  PULMONARY:  [] productive cough, [] asthma, [] wheezing  NEUROLOGIC:  [] weakness in arms or legs, [] numbness in arms or legs, [] difficulty speaking or slurred speech, [] temporary loss of vision in one eye, [] dizziness  HEMATOLOGIC:  [] bleeding problems, [] problems with blood clotting too easily  MUSCULOSKEL:  [] joint pain, [] joint swelling  GASTROINTEST:  [] vomiting blood, [] blood in stool     GENITOURINARY:  [] burning with urination, [] blood in urine  PSYCHIATRIC:  [] history of major depression  INTEGUMENTARY:  [] rashes, [] ulcers     Physical Examination  Filed Vitals:   11/06/13 0845  BP: 138/65  Pulse: 81  Height: 5' 3" (1.6 m)  Weight: 143 lb 3.2 oz (64.955 kg)  SpO2: 100%   Body mass  index is 25.37 kg/(m^2).  General: A&O x 3, WD, WN  Pulmonary: Sym exp, good air movt, CTAB, no rales, rhonchi, & wheezing  Cardiac: RRR, Nl S1, S2, no Murmurs, rubs or gallops, RIJ TDC in place  Vascular: palpable R brachial, no ulnar or radial pulse, strongly palpable thrill in access, strong bruit in access  Gastrointestinal: soft, NTND, -G/R, - HSM, - masses, - CVAT B  Musculoskeletal: M/S 5/5 throughout , Extremities without  ischemic changes , Swollen R arm  Neurologic: Pain and light touch intact in extremities , Motor exam as listed above  Medical Decision Making  [name] is a 79 y.o. female who presents with ESRD requiring hemodialysis, RIJ TDC is causing obstruction of R innominate causing R arm swelling.   I would move the TDC to L IJV and remove RIJ TDC to relieve outflow obstruction.  Once the patient's arm swelling has improved, I would start cannulating the R BC AVF.    The patient's son  is aware the risks of tunneled dialysis catheter placement include but are not limited to: bleeding, infection, central venous injury, pneumothorax, possible venous stenosis, possible malpositioning in the venous system, and possible infections related to long-term catheter presence.   The patient and family are aware of these risks and agreed to proceed which is scheduled for 22 DEC 14.  Allani Reber, MD [facility] [place] [facility] Office: [phone] Pager: [phone]  11/06/2013, 9:10 AM   

## 2013-11-09 NOTE — Preoperative (Signed)
Beta Blockers   Reason not to administer Beta Blockers:Not Applicable 

## 2013-11-09 NOTE — Anesthesia Postprocedure Evaluation (Signed)
  Anesthesia Post-op Note  Patient: [name]  Procedure(s) Performed: Procedure(s): REMOVAL OF A DIALYSIS CATHETER- Right TDC  (Right) INSERTION OF DIALYSIS CATHETER- Left TDC (Left)  Patient Location: PACU  Anesthesia Type:MAC  Level of Consciousness: awake  Airway and Oxygen Therapy: Patient Spontanous Breathing  Post-op Pain: mild  Post-op Assessment: Post-op Vital signs reviewed  Post-op Vital Signs: Reviewed  Complications: No apparent anesthesia complications

## 2013-11-09 NOTE — Transfer of Care (Signed)
Immediate Anesthesia Transfer of Care Note  Patient: [name]  Procedure(s) Performed: Procedure(s): REMOVAL OF A DIALYSIS CATHETER- Right TDC  (Right) INSERTION OF DIALYSIS CATHETER- Left TDC (Left)  Patient Location: PACU  Anesthesia Type:MAC  Level of Consciousness: awake, alert  and oriented  Airway & Oxygen Therapy: Patient Spontanous Breathing  Post-op Assessment: Report given to PACU RN  Post vital signs: Reviewed and stable  Complications: No apparent anesthesia complications

## 2013-11-09 NOTE — Anesthesia Preprocedure Evaluation (Addendum)
Anesthesia Evaluation  Patient identified by MRN, date of birth, ID band Patient awake    Reviewed: Allergy & Precautions, H&P , NPO status , Patient's Chart, lab work & pertinent test results  Airway Mallampati: II TM Distance: >3 FB Neck ROM: Full    Dental   Pulmonary shortness of breath, pneumonia -,  breath sounds clear to auscultation        Cardiovascular negative cardio ROS  Rhythm:Regular     Neuro/Psych    GI/Hepatic negative GI ROS, Neg liver ROS,   Endo/Other    Renal/GU ESRF and DialysisRenal disease     Musculoskeletal   Abdominal   Peds  Hematology   Anesthesia Other Findings   Reproductive/Obstetrics                          Anesthesia Physical Anesthesia Plan  ASA: III  Anesthesia Plan: MAC   Post-op Pain Management:    Induction: Intravenous  Airway Management Planned: Mask  Additional Equipment:   Intra-op Plan:   Post-operative Plan:   Informed Consent:   Dental advisory given  Plan Discussed with: CRNA, Anesthesiologist and Surgeon  Anesthesia Plan Comments:         Anesthesia Quick Evaluation

## 2013-11-09 NOTE — Progress Notes (Signed)
Reviewed CXR from July with Dr. Meela Rosenthal she advised we do not need to repeat CXR

## 2013-11-09 NOTE — Addendum Note (Signed)
Addendum created 11/09/13 4782 by Dairl Ponder, CRNA   Modules edited: Anesthesia Medication Administration, Anesthesia Responsible Staff

## 2013-11-09 NOTE — Interval H&P Note (Signed)
History and Physical Interval Note:  11/09/2013 6:49 AM  [name]  has presented today for surgery, with the diagnosis of End Stage Renal Disease  The various methods of treatment have been discussed with the patient and family. After consideration of risks, benefits and other options for treatment, the patient has consented to  Procedure(s): REMOVAL OF A DIALYSIS CATHETER- Right TDC  (Right) INSERTION OF DIALYSIS CATHETER- Left TDC (Left) as a surgical intervention .  The patient's history has been reviewed, patient examined, no change in status, stable for surgery.  I have reviewed the patient's chart and labs.  Questions were answered to the patient's satisfaction.     Chany Woolworth LIANG-YU

## 2013-11-09 NOTE — Progress Notes (Signed)
Pt with right arm swelling. Grandson states no chg from preop and that is was reason for surgery

## 2013-11-10 ENCOUNTER — Encounter (HOSPITAL_COMMUNITY): Payer: Self-pay | Admitting: Vascular Surgery

## 2013-12-07 ENCOUNTER — Telehealth: Payer: Self-pay

## 2013-12-07 NOTE — Telephone Encounter (Signed)
Rec'd. phone call from pt's son.  Reports that pt. continues to have swelling in the right arm from fingertips to shoulder, even after her dialysis catheter was moved from the right IJ to the left IJ.  States the swelling is not worse, but has not improved.  States pt. doesn't c/o pain, and appears to use the right hand without any difficulty gripping.  He reports that she has dementia.  Son is questioning what the next step is?  Will inform Dr. Imogene Burnhen of continued swelling.  Informed son, will call back with further recommendations per Dr. Imogene Burnhen.  Verb. Understanding.

## 2013-12-08 ENCOUNTER — Other Ambulatory Visit: Payer: Self-pay

## 2013-12-08 NOTE — Telephone Encounter (Signed)
Dr. Imogene Burnhen made aware of pt's continued swelling of right arm.  Per Dr. Imogene Burnhen; schedule a Fistulogram (R) arm with poss. Intervention.  Attempted to notify son, Onalee HuaDavid.  Left msg to call office.

## 2013-12-08 NOTE — Telephone Encounter (Signed)
Notified pt's son per phone of the plan to schedule a fistulogram to evaluate right arm swelling.  Scheduled procedure on 12/17/13.  Instructions given to son.  Verb. Understanding.

## 2013-12-15 ENCOUNTER — Other Ambulatory Visit: Payer: Self-pay

## 2013-12-16 ENCOUNTER — Encounter (HOSPITAL_COMMUNITY): Payer: Self-pay | Admitting: *Deleted

## 2013-12-16 ENCOUNTER — Encounter (HOSPITAL_COMMUNITY): Payer: Self-pay | Admitting: Pharmacy Technician

## 2013-12-16 MED ORDER — DEXTROSE 5 % IV SOLN
1.5000 g | INTRAVENOUS | Status: AC
  Administered 2013-12-17: 1.5 g via INTRAVENOUS
  Filled 2013-12-16: qty 1.5

## 2013-12-17 ENCOUNTER — Encounter (HOSPITAL_COMMUNITY): Admission: RE | Payer: Self-pay | Source: Ambulatory Visit

## 2013-12-17 ENCOUNTER — Encounter (HOSPITAL_COMMUNITY)
Admission: RE | Disposition: A | Discharge status: Home / Self Care | Payer: Self-pay | Source: Ambulatory Visit | Attending: Vascular Surgery

## 2013-12-17 ENCOUNTER — Encounter (HOSPITAL_COMMUNITY): Payer: Medicare HMO | Admitting: Certified Registered Nurse Anesthetist

## 2013-12-17 ENCOUNTER — Ambulatory Visit (HOSPITAL_COMMUNITY): Payer: Medicare HMO | Admitting: Certified Registered Nurse Anesthetist

## 2013-12-17 ENCOUNTER — Ambulatory Visit (HOSPITAL_COMMUNITY)
Admission: RE | Admit: 2013-12-17 | Discharge: 2013-12-17 | Disposition: A | Discharge status: Home / Self Care | Payer: Medicare HMO | Source: Ambulatory Visit | Attending: Vascular Surgery | Admitting: Vascular Surgery

## 2013-12-17 ENCOUNTER — Ambulatory Visit (HOSPITAL_COMMUNITY): Admission: RE | Admit: 2013-12-17 | Payer: Medicare HMO | Source: Ambulatory Visit | Admitting: Vascular Surgery

## 2013-12-17 ENCOUNTER — Ambulatory Visit (HOSPITAL_COMMUNITY): Payer: Medicare HMO

## 2013-12-17 ENCOUNTER — Encounter (HOSPITAL_COMMUNITY): Payer: Self-pay | Admitting: Surgery

## 2013-12-17 DIAGNOSIS — N186 End stage renal disease: Secondary | ICD-10-CM | POA: Diagnosis not present

## 2013-12-17 DIAGNOSIS — F0390 Unspecified dementia without behavioral disturbance: Secondary | ICD-10-CM | POA: Insufficient documentation

## 2013-12-17 DIAGNOSIS — F411 Generalized anxiety disorder: Secondary | ICD-10-CM | POA: Diagnosis not present

## 2013-12-17 DIAGNOSIS — T82898A Other specified complication of vascular prosthetic devices, implants and grafts, initial encounter: Secondary | ICD-10-CM | POA: Insufficient documentation

## 2013-12-17 DIAGNOSIS — Z992 Dependence on renal dialysis: Secondary | ICD-10-CM | POA: Diagnosis not present

## 2013-12-17 DIAGNOSIS — Y832 Surgical operation with anastomosis, bypass or graft as the cause of abnormal reaction of the patient, or of later complication, without mention of misadventure at the time of the procedure: Secondary | ICD-10-CM | POA: Insufficient documentation

## 2013-12-17 HISTORY — PX: EXCHANGE OF A DIALYSIS CATHETER: SHX5818

## 2013-12-17 HISTORY — PX: FISTULOGRAM: SHX5832

## 2013-12-17 HISTORY — PX: LIGATION OF COMPETING BRANCHES OF ARTERIOVENOUS FISTULA: SHX5949

## 2013-12-17 LAB — POCT I-STAT, CHEM 8
BUN: 44 mg/dL — AB (ref 6–23)
CALCIUM ION: 1.21 mmol/L (ref 1.13–1.30)
Chloride: 100 mEq/L (ref 96–112)
Creatinine, Ser: 5 mg/dL — ABNORMAL HIGH (ref 0.50–1.10)
Glucose, Bld: 84 mg/dL (ref 70–99)
HCT: 35 % — ABNORMAL LOW (ref 36.0–46.0)
Hemoglobin: 11.9 g/dL — ABNORMAL LOW (ref 12.0–15.0)
Potassium: 3.6 mEq/L — ABNORMAL LOW (ref 3.7–5.3)
Sodium: 138 mEq/L (ref 137–147)
TCO2: 24 mmol/L (ref 0–100)

## 2013-12-17 SURGERY — EXCHANGE OF A DIALYSIS CATHETER
Anesthesia: General | Site: Neck | Laterality: Right

## 2013-12-17 SURGERY — ASSESSMENT, SHUNT FUNCTION, WITH CONTRAST RADIOGRAPHIC STUDY
Anesthesia: LOCAL

## 2013-12-17 MED ORDER — PROPOFOL 10 MG/ML IV BOLUS
INTRAVENOUS | Status: DC | PRN
  Administered 2013-12-17: 170 mg via INTRAVENOUS

## 2013-12-17 MED ORDER — PHENYLEPHRINE HCL 10 MG/ML IJ SOLN
INTRAMUSCULAR | Status: AC
  Filled 2013-12-17: qty 1

## 2013-12-17 MED ORDER — FENTANYL CITRATE 0.05 MG/ML IJ SOLN: 25.0000 ug | INTRAMUSCULAR | Status: DC | PRN

## 2013-12-17 MED ORDER — OXYCODONE-ACETAMINOPHEN 5-325 MG PO TABS: 1.0000 | ORAL_TABLET | ORAL | Status: DC | PRN

## 2013-12-17 MED ORDER — 0.9 % SODIUM CHLORIDE (POUR BTL) OPTIME
TOPICAL | Status: DC | PRN
  Administered 2013-12-17: 1000 mL

## 2013-12-17 MED ORDER — SODIUM CHLORIDE 0.9 % IJ SOLN
INTRAMUSCULAR | Status: AC
  Filled 2013-12-17: qty 20

## 2013-12-17 MED ORDER — PHENYLEPHRINE 40 MCG/ML (10ML) SYRINGE FOR IV PUSH (FOR BLOOD PRESSURE SUPPORT)
PREFILLED_SYRINGE | INTRAVENOUS | Status: AC
  Filled 2013-12-17: qty 10

## 2013-12-17 MED ORDER — FENTANYL CITRATE 0.05 MG/ML IJ SOLN
INTRAMUSCULAR | Status: DC | PRN
  Administered 2013-12-17: 75 ug via INTRAVENOUS

## 2013-12-17 MED ORDER — SODIUM CHLORIDE 0.9 % IJ SOLN: 3.0000 mL | INTRAMUSCULAR | Status: DC | PRN

## 2013-12-17 MED ORDER — HEPARIN SODIUM (PORCINE) 1000 UNIT/ML IJ SOLN
INTRAMUSCULAR | Status: AC
  Filled 2013-12-17: qty 1

## 2013-12-17 MED ORDER — PROPOFOL 10 MG/ML IV BOLUS
INTRAVENOUS | Status: AC
  Filled 2013-12-17: qty 20

## 2013-12-17 MED ORDER — THROMBIN 20000 UNITS EX SOLR
CUTANEOUS | Status: AC
  Filled 2013-12-17: qty 20000

## 2013-12-17 MED ORDER — GLYCOPYRROLATE 0.2 MG/ML IJ SOLN
INTRAMUSCULAR | Status: AC
  Filled 2013-12-17: qty 3

## 2013-12-17 MED ORDER — IODIXANOL 320 MG/ML IV SOLN
INTRAVENOUS | Status: DC | PRN
Start: 2013-12-17 — End: 2013-12-17
  Administered 2013-12-17: 100 mL via INTRAVENOUS

## 2013-12-17 MED ORDER — HEPARIN SODIUM (PORCINE) 5000 UNIT/ML IJ SOLN
INTRAMUSCULAR | Status: DC | PRN
  Administered 2013-12-17 (×2)

## 2013-12-17 MED ORDER — EPHEDRINE SULFATE 50 MG/ML IJ SOLN
INTRAMUSCULAR | Status: AC
  Filled 2013-12-17: qty 1

## 2013-12-17 MED ORDER — SODIUM CHLORIDE 0.9 % IV SOLN
INTRAVENOUS | Status: DC
  Administered 2013-12-17: 11:00:00 via INTRAVENOUS

## 2013-12-17 MED ORDER — SODIUM CHLORIDE 0.9 % IV SOLN
10.0000 mg | INTRAVENOUS | Status: DC | PRN
  Administered 2013-12-17: 40 ug/min via INTRAVENOUS

## 2013-12-17 MED ORDER — LIDOCAINE HCL (CARDIAC) 10 MG/ML IV SOLN
INTRAVENOUS | Status: DC | PRN
  Administered 2013-12-17: 70 mg via INTRAVENOUS

## 2013-12-17 MED ORDER — ONDANSETRON HCL 4 MG/2ML IJ SOLN
INTRAMUSCULAR | Status: AC
  Filled 2013-12-17: qty 2

## 2013-12-17 MED ORDER — HEPARIN SODIUM (PORCINE) 1000 UNIT/ML IJ SOLN
INTRAMUSCULAR | Status: DC | PRN
  Administered 2013-12-17: 5 mL via INTRAVENOUS

## 2013-12-17 MED ORDER — FENTANYL CITRATE 0.05 MG/ML IJ SOLN
INTRAMUSCULAR | Status: AC
  Filled 2013-12-17: qty 5

## 2013-12-17 MED ORDER — NEOSTIGMINE METHYLSULFATE 1 MG/ML IJ SOLN
INTRAMUSCULAR | Status: AC
  Filled 2013-12-17: qty 10

## 2013-12-17 MED ORDER — SODIUM CHLORIDE 0.9 % IV SOLN
INTRAVENOUS | Status: DC | PRN
  Administered 2013-12-17 (×2): via INTRAVENOUS

## 2013-12-17 SURGICAL SUPPLY — 62 items
BAG DECANTER FOR FLEXI CONT (MISCELLANEOUS) IMPLANT
BALLN MUSTANG 5.0X40 75 (BALLOONS) ×4
BALLN MUSTANG 5.0X40 75CM (BALLOONS) ×1
BALLN MUSTANG 6.0X40 75 (BALLOONS) ×4
BALLN MUSTANG 6.0X40 75CM (BALLOONS) ×1
BALLOON MUSTANG 5.0X40 75 (BALLOONS) ×3 IMPLANT
BALLOON MUSTANG 6.0X40 75 (BALLOONS) ×3 IMPLANT
CANISTER SUCTION 2500CC (MISCELLANEOUS) ×5 IMPLANT
CATH BEACON 5.038 65CM KMP-01 (CATHETERS) ×10 IMPLANT
CATH CANNON HEMO 15F 50CM (CATHETERS) IMPLANT
CATH CANNON HEMO 15FR 19 (HEMODIALYSIS SUPPLIES) IMPLANT
CATH CANNON HEMO 15FR 23CM (HEMODIALYSIS SUPPLIES) IMPLANT
CATH CANNON HEMO 15FR 31CM (HEMODIALYSIS SUPPLIES) IMPLANT
CATH CANNON HEMO 15FR 32CM (HEMODIALYSIS SUPPLIES) ×5 IMPLANT
CATH STRAIGHT 5FR 65CM (CATHETERS) ×5 IMPLANT
COVER PROBE W GEL 5X96 (DRAPES) ×5 IMPLANT
COVER SURGICAL LIGHT HANDLE (MISCELLANEOUS) ×5 IMPLANT
DERMABOND ADVANCED (GAUZE/BANDAGES/DRESSINGS) ×2
DERMABOND ADVANCED .7 DNX12 (GAUZE/BANDAGES/DRESSINGS) ×3 IMPLANT
DEVICE TORQUE KENDALL .025-038 (MISCELLANEOUS) ×5 IMPLANT
DRAPE CHEST BREAST 15X10 FENES (DRAPES) ×5 IMPLANT
ELECT REM PT RETURN 9FT ADLT (ELECTROSURGICAL) ×5
ELECTRODE REM PT RTRN 9FT ADLT (ELECTROSURGICAL) ×3 IMPLANT
GAUZE SPONGE 2X2 8PLY STRL LF (GAUZE/BANDAGES/DRESSINGS) ×3 IMPLANT
GAUZE SPONGE 4X4 16PLY XRAY LF (GAUZE/BANDAGES/DRESSINGS) ×5 IMPLANT
GLOVE BIO SURGEON STRL SZ7 (GLOVE) ×5 IMPLANT
GLOVE BIOGEL PI IND STRL 7.5 (GLOVE) ×3 IMPLANT
GLOVE BIOGEL PI INDICATOR 7.5 (GLOVE) ×2
GLOVE ECLIPSE 7.5 STRL STRAW (GLOVE) ×10 IMPLANT
GOWN STRL REUS W/ TWL LRG LVL3 (GOWN DISPOSABLE) ×9 IMPLANT
GOWN STRL REUS W/TWL LRG LVL3 (GOWN DISPOSABLE) ×6
GUIDEWIRE ANGLED .035X150CM (WIRE) ×5 IMPLANT
INTRODUCER COOK 11FR (CATHETERS) IMPLANT
INTRODUCER SET COOK 14FR (MISCELLANEOUS) IMPLANT
KIT BASIN OR (CUSTOM PROCEDURE TRAY) ×5 IMPLANT
KIT ENCORE 26 ADVANTAGE (KITS) ×5 IMPLANT
KIT ROOM TURNOVER OR (KITS) ×5 IMPLANT
NEEDLE 18GX1X1/2 (RX/OR ONLY) (NEEDLE) ×5 IMPLANT
NS IRRIG 1000ML POUR BTL (IV SOLUTION) ×5 IMPLANT
PACK CV ACCESS (CUSTOM PROCEDURE TRAY) ×5 IMPLANT
PAD ARMBOARD 7.5X6 YLW CONV (MISCELLANEOUS) ×10 IMPLANT
SET INTRODUCER 12FR PACEMAKER (SHEATH) IMPLANT
SET MICROPUNCTURE 5F STIFF (MISCELLANEOUS) ×5 IMPLANT
SHEATH PINNACLE R/O II 6F 4CM (SHEATH) ×5 IMPLANT
SPONGE GAUZE 2X2 STER 10/PKG (GAUZE/BANDAGES/DRESSINGS) ×2
SUT ETHILON 3 0 PS 1 (SUTURE) ×5 IMPLANT
SUT MNCRL AB 4-0 PS2 18 (SUTURE) ×5 IMPLANT
SUT PROLENE 6 0 BV (SUTURE) ×5 IMPLANT
SUT SILK 2 0 FS (SUTURE) IMPLANT
SUT VIC AB 3-0 SH 27 (SUTURE) ×2
SUT VIC AB 3-0 SH 27X BRD (SUTURE) ×3 IMPLANT
SUT VIC AB 4-0 PS2 27 (SUTURE) ×10 IMPLANT
SYR 20CC LL (SYRINGE) ×15 IMPLANT
SYR 30ML LL (SYRINGE) IMPLANT
SYR 5ML LL (SYRINGE) ×5 IMPLANT
SYRINGE 10CC LL (SYRINGE) ×5 IMPLANT
TOWEL OR 17X24 6PK STRL BLUE (TOWEL DISPOSABLE) ×5 IMPLANT
TOWEL OR 17X26 10 PK STRL BLUE (TOWEL DISPOSABLE) ×5 IMPLANT
UNDERPAD 30X30 INCONTINENT (UNDERPADS AND DIAPERS) IMPLANT
WATER STERILE IRR 1000ML POUR (IV SOLUTION) ×5 IMPLANT
WIRE AMPLATZ SS-J .035X180CM (WIRE) ×5 IMPLANT
WIRE BENTSON .035X145CM (WIRE) ×5 IMPLANT

## 2013-12-17 NOTE — Preoperative (Signed)
Beta Blockers   Reason not to administer Beta Blockers:Not Applicable 

## 2013-12-17 NOTE — Anesthesia Procedure Notes (Signed)
Procedure Name: LMA Insertion Date/Time: 12/17/2013 12:34 PM Performed by: Rogelia BogaMUELLER, Kellis Topete P Pre-anesthesia Checklist: Patient identified, Emergency Drugs available, Suction available, Patient being monitored and Timeout performed Patient Re-evaluated:Patient Re-evaluated prior to inductionOxygen Delivery Method: Circle system utilized Preoxygenation: Pre-oxygenation with 100% oxygen Intubation Type: IV induction Ventilation: Mask ventilation without difficulty LMA: LMA inserted LMA Size: 4.0 Number of attempts: 1 Placement Confirmation: positive ETCO2 and breath sounds checked- equal and bilateral Tube secured with: Tape Dental Injury: Teeth and Oropharynx as per pre-operative assessment

## 2013-12-17 NOTE — Interval H&P Note (Signed)
History and Physical Interval Note:  12/17/2013 11:53 AM  [name]  has presented today for surgery, with the diagnosis of END STAGE RENAL DISEASE DECREASED ACCESS FLOW THROUGH LEFT IJ DIALYSIS CATHETER RIGHT ARM SWELLING  The various methods of treatment have been discussed with the patient and family. After consideration of risks, benefits and other options for treatment, the patient has consented to  Procedure(s): EXCHANGE OF A DIALYSIS CATHETER (Left) FISTULOGRAM- RIGHT ARM- POSSIBLE INTERVENTION (Right) as a surgical intervention .  The patient's history has been reviewed, patient examined, no change in status, stable for surgery.  I have reviewed the patient's chart and labs.  Questions were answered to the patient's satisfaction.     Lesli Issa S

## 2013-12-17 NOTE — Progress Notes (Signed)
Patient is oriented to person and place but is unaware of situation. Kipp BroodBrent (grandson) is at chair side and answered most of questions during pre-op. Kipp BroodBrent stated his father Onalee HuaDavid is POA but is at home sick with the flu. Kipp BroodBrent gave Nurse his fathers number and this Nurse and Eunice Blaseebbie, RN called Onalee Huaavid and obtained a telephone consent. Consent signed and witnessed by Debbie,RN and placed on chart.

## 2013-12-17 NOTE — H&P (Signed)
Brief History and Physical  History of Present Illness  [name] is a 80 y.o. female who presents with chief complaint: right arm swelling in the setting of left brachiocephalic arteriovenous fistula and known obturation of right innominate vein by tunneled dialysis catheter.  She currently has hemodialysis via a left internal jugular vein tunneled dialysis catheter.  By report, flow rates had been limited via that tunneled dialysis catheter.  The patient presents today for R arm fistulogram, possible venoplasty; left fibrin sheath angioplasty, exchange of LIJ TDC.[name]    Past Medical History  Diagnosis Date  . Renal disorder   . Shortness of breath     with exertion  . Pneumonia 05/2013  . Mental disorder     dementia - short term memory  . History of blood transfusion     after fistula ruptured  . Dementia     Past Surgical History  Procedure Laterality Date  . Dialysis fistula creation    . Ligation of arteriovenous  fistula Left 06/04/2013    Procedure: LIGATION OF ARTERIOVENOUS  FISTULA;  Surgeon: Fransisco Hertz, MD;  Location: [facility] OR;  Service: Vascular;  Laterality: Left;  . Insertion of dialysis catheter Right 06/04/2013    Procedure: INSERTION OF DIALYSIS CATHETER;  Surgeon: Fransisco Hertz, MD;  Location: [facility] OR;  Service: Vascular;  Laterality: Right;  . Cholecystectomy  1992  . Av fistula placement Right 07/07/2013    Procedure: ARTERIOVENOUS (AV) FISTULA CREATION - RIGHT BRACHIAL CEPHALIC ;  Surgeon: Fransisco Hertz, MD;  Location: [facility] OR;  Service: Vascular;  Laterality: Right;  . Removal of a dialysis catheter Right 11/09/2013    Procedure: REMOVAL OF A DIALYSIS CATHETER- Right TDC ;  Surgeon: Fransisco Hertz, MD;  Location: [facility] OR;  Service: Vascular;  Laterality: Right;  . Insertion of dialysis catheter Left 11/09/2013    Procedure: INSERTION OF DIALYSIS CATHETER- Left TDC;  Surgeon: Fransisco Hertz, MD;  Location: [facility] OR;  Service: Vascular;  Laterality: Left;    History    Social History  . Marital Status: Divorced    Spouse Name: N/A    Number of Children: N/A  . Years of Education: N/A   Occupational History  . Not on file.   Social History Main Topics  . Smoking status: Never Smoker   . Smokeless tobacco: Never Used  . Alcohol Use: No  . Drug Use: No  . Sexual Activity: No   Other Topics Concern  . Not on file   Social History Narrative  . No narrative on file    History reviewed. No pertinent family history.  No current facility-administered medications on file prior to encounter.   Current Outpatient Prescriptions on File Prior to Encounter  Medication Sig Dispense Refill  . donepezil (ARICEPT) 10 MG tablet Take 10 mg by mouth at bedtime.      . ferrous sulfate 325 (65 FE) MG tablet Take 1 tablet (325 mg total) by mouth 2 (two) times daily with a meal.  60 tablet  0  . memantine (NAMENDA) 10 MG tablet Take 10 mg by mouth 2 (two) times daily. For memory      . multivitamin (RENA-VIT) TABS tablet Take 1 tablet by mouth daily.      . sevelamer carbonate (RENVELA) 800 MG tablet Take 800 mg by mouth 3 (three) times daily with meals.      . torsemide (DEMADEX) 20 MG tablet Take 20 mg by mouth daily.      [name]  diazepam (VALIUM) 2 MG tablet Take 2 mg by mouth every 6 (six) hours as needed for anxiety (on dialysis days).      [name]. oxyCODONE (ROXICODONE) 5 MG immediate release tablet Take 1 tablet (5 mg total) by mouth every 4 (four) hours as needed for severe pain.  15 tablet  0    Allergies  Allergen Reactions  . Sulfa Antibiotics Nausea And Vomiting and Rash  . Sulfur Nausea And Vomiting and Rash    Review of Systems: As listed above, otherwise negative.  Physical Examination  Filed Vitals:   12/17/13 0937  BP: 155/58  Pulse: 74  Temp: 97.7 F (36.5 C)  TempSrc: Oral  Resp: 18  Height: 5\' 1"  (1.549 m)  Weight: 138 lb 4 oz (62.71 kg)  SpO2: 100%    General: A&O x 3, WDWN  Pulmonary: Sym exp, good air movt, CTAB, no rales,  rhonchi, & wheezing  Cardiac: RRR, Nl S1, S2, no Murmurs, rubs or gallops  Gastrointestinal: soft, NTND, -G/R, - HSM, - masses, - CVAT B  Musculoskeletal: M/S 5/5 throughout , Extremities without ischemic changes , palpable thrill and bruit, right arm swelling  Laboratory See iStat  Medical Decision Making  [name] is a 80 y.o. 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   The patient is scheduled for: R arm fistulogram, possible venoplasty; left fibrin sheath angioplasty, exchange of LIJ TDC. I discussed with the patient the nature of angiographic procedures, especially the limited patencies of any endovascular intervention.  The patient is aware of that the risks of an angiographic procedure include but are not limited to: bleeding, infection, access site complications, renal failure, embolization, rupture of vessel, dissection, possible need for emergent surgical intervention, possible need for surgical procedures to treat the patient's pathology, and stroke and death.   The patient is aware the risks of tunneled dialysis catheter placement include but are not limited to: bleeding, infection, central venous injury, pneumothorax, possible venous stenosis, possible malpositioning in the venous system, and possible infections related to long-term catheter presence.   The patient is aware of the risks and agrees to proceed.  Leonides SakeBrian Isac Lincks, MD [facility] [place] Office: [phone] Pager: [phone]  12/17/2013, 11:02 AM

## 2013-12-17 NOTE — Op Note (Signed)
NAME: Deanna StarchJean I Buckles    MRN: [id number] DOB: 10/19/1931    DATE OF OPERATION: 12/17/2013  PREOP DIAGNOSIS:  1. End-stage renal disease.  2. Swelling right upper extremity.  3. Poorly functioning left IJ tunneled dialysis catheter.  POSTOP DIAGNOSIS: Same  PROCEDURE:  1. Ultrasound-guided access to right brachiocephalic AV fistula 2. Fistulogram right brachiocephalic AV fistula 3. Venoplasty right cephalic vein stenosis 4. Ligation of competing branch right brachiocephalic AV fistula 5. Placement of left IJ 27 cm tunneled dialysis catheter  SURGEON: Di Kindlehristopher S. Edilia Boickson, MD, FACS  ASSIST: none  ANESTHESIA: Gen.   EBL: 100 cc  INDICATIONS: [name] is a 80 y.o. female who had a right brachiocephalic AV fistula and has had significant arm swelling. She also has a poorly functioning left IJ tunneled dialysis catheter. She was evaluated by Dr. Imogene Burnhen and it was recommended she have her catheter changed also undergo a fistulogram to evaluate for an outflow stenosis.  FINDINGS: There was a significant stenosis in the central portion of the right brachiocephalic AV fistula which was successfully venoplastied. There was a large competing branch which was ligated. The catheter was very difficult as even when I placed it deep into the right atrium ports would continually flipped back and kinked. Therefore I ultimately had to place the tip of the catheter in the inferior vena cava where it worked well.   TECHNIQUE:  The patient was brought to the operating room and received general anesthetic. The left upper extremity the right neck and chest were prepped and draped in the usual sterile fashion. Attention was first turned to the right arm. Under ultrasound guidance, the proximal fistula was cannulated with a micropuncture needle and micropuncture sheath introduced over the wire. A fistulogram was then obtained and the fistula was evaluated from the cannulation site to the central veins. There  was a mild stenosis in the right brachiocephalic vein in a more significant stenosis in the midportion of the upper arm cephalic vein. I elected to address this with venoplasty. The micropuncture sheath was exchanged for a short 6 [place] sheath over a [employer] wire. I selected a 5 mm x 4 cm balloon which was inflated to 24 atmospheres for 1 minute. Follow up films showed some mild residual stenosis. I. Therefore went back with a 6 mm x 4 cm balloon which was inflated to 24 atmospheres for 1 minute. Completion films showed an excellent result. I did advance a comfy catheter into the distal vein to allow better visualization of the innominate stenosis which did not appear to be significant.  There was also a large competing branch. I felt this should be ligated. A small transverse incision was made over the area after was marked with ultrasound. The branch was clearly identified and ligated with 2 2-0 silk ties and divided. This wound was closed with deep layer 3-0 Vicryl and the skin closed with 4-0 Vicryl.  Next attention was turned to placement of a new left IJ catheter. A wire was passed through the old catheter and advanced down into the right atrium. The old catheter was removed. The dilator and peel-away sheath were passed over the wire. A new catheter was placed through the peel-away sheath after the dilator was removed. I positioned the catheter very low in the right atrium but continually the catheter was being pushed back and kinking at the junction of the right brachiocephalic vein and superior vena cava. I tried multiple times ultimately was not able to successfully  keep the catheter low enough to prevent the 2 ports of the catheter from separating with one catheter going superiorly and one catheter going inferiorly. I felt really the best option therefore was to place the catheter down into the inferior vena cava. A guidewire was advanced through the catheter down into the right atrium. I used a comfy  catheter to direct angle Glidewire down into the inferior vena cava and then exchanged the angled Glidewire for an Amplatz wire. The dilator and peel-away sheath were then advanced over the Amplatz wire after the old catheter was removed. The catheter was threaded over the wire down into the inferior vena cava and the wire removed. The exit site the cath was selected and the catheter brought through the tunnel and an cut and the distal ports were attached. Both ports withdrew easily and then flushed with heparinized saline. The cath was secured at its exit site with a 3-0 nylon suture. The IJ cannulation site was closed with 4-0 subcuticular stitch. Dermabond was applied. The patient tolerated the procedure well and was transferred to the recovery room in stable condition. All needle and sponge counts were correct.  Waverly Ferrari, MD, FACS [facility]  DATE OF DICTATION:   12/17/2013

## 2013-12-17 NOTE — Anesthesia Postprocedure Evaluation (Signed)
Anesthesia Post Note  Patient: [name]  Procedure(s) Performed: Procedure(s) (LRB): EXCHANGE OF A DIALYSIS CATHETER (Left) FISTULOGRAM- RIGHT ARM- POSSIBLE INTERVENTION (Right) LIGATION OF COMPETING BRANCHES OF ARTERIOVENOUS FISTULA (Right)  Anesthesia type: General  Patient location: PACU  Post pain: Pain level controlled  Post assessment: Patient's Cardiovascular Status Stable  Last Vitals:  Filed Vitals:   12/17/13 1600  BP: 143/51  Pulse: 73  Temp:   Resp: 18    Post vital signs: Reviewed and stable  Level of consciousness: alert  Complications: No apparent anesthesia complications

## 2013-12-17 NOTE — Anesthesia Preprocedure Evaluation (Addendum)
Anesthesia Evaluation  Patient identified by MRN, date of birth, ID band Patient awake    Reviewed: Allergy & Precautions, H&P , NPO status , Patient's Chart, lab work & pertinent test results  Airway Mallampati: II TM Distance: >3 FB Neck ROM: Full    Dental  (+) Teeth Intact   Pulmonary shortness of breath, pneumonia -,          Cardiovascular negative cardio ROS      Neuro/Psych Anxiety    GI/Hepatic negative GI ROS,   Endo/Other    Renal/GU DialysisRenal disease     Musculoskeletal   Abdominal   Peds  Hematology  (+) anemia ,   Anesthesia Other Findings   Reproductive/Obstetrics                          Anesthesia Physical Anesthesia Plan  ASA: III  Anesthesia Plan: MAC   Post-op Pain Management:    Induction: Intravenous  Airway Management Planned: LMA  Additional Equipment:   Intra-op Plan:   Post-operative Plan: Extubation in OR  Informed Consent: I have reviewed the patients History and Physical, chart, labs and discussed the procedure including the risks, benefits and alternatives for the proposed anesthesia with the patient or authorized representative who has indicated his/her understanding and acceptance.   Dental advisory given  Plan Discussed with: CRNA, Anesthesiologist and Surgeon  Anesthesia Plan Comments:        Anesthesia Quick Evaluation

## 2013-12-17 NOTE — Transfer of Care (Signed)
Immediate Anesthesia Transfer of Care Note  Patient: [name]  Procedure(s) Performed: Procedure(s): EXCHANGE OF A DIALYSIS CATHETER (Left) FISTULOGRAM- RIGHT ARM- POSSIBLE INTERVENTION (Right) LIGATION OF COMPETING BRANCHES OF ARTERIOVENOUS FISTULA (Right)  Patient Location: PACU  Anesthesia Type:General  Level of Consciousness: awake, alert , oriented and patient cooperative  Airway & Oxygen Therapy: Patient Spontanous Breathing and Patient connected to nasal cannula oxygen  Post-op Assessment: Report given to PACU RN and Post -op Vital signs reviewed and stable  Post vital signs: Reviewed and stable  Complications: No apparent anesthesia complications

## 2013-12-18 ENCOUNTER — Encounter (HOSPITAL_COMMUNITY): Payer: Self-pay | Admitting: Vascular Surgery

## 2013-12-20 ENCOUNTER — Emergency Department (HOSPITAL_COMMUNITY): Payer: Medicare HMO

## 2013-12-20 ENCOUNTER — Encounter (HOSPITAL_COMMUNITY): Payer: Self-pay | Admitting: Emergency Medicine

## 2013-12-20 ENCOUNTER — Inpatient Hospital Stay (HOSPITAL_COMMUNITY)
Admission: EM | Admit: 2013-12-20 | Discharge: 2013-12-23 | DRG: 689 | Disposition: A | Discharge status: Home / Self Care | Payer: Medicare HMO | Attending: Internal Medicine | Admitting: Internal Medicine

## 2013-12-20 DIAGNOSIS — J9600 Acute respiratory failure, unspecified whether with hypoxia or hypercapnia: Secondary | ICD-10-CM

## 2013-12-20 DIAGNOSIS — N039 Chronic nephritic syndrome with unspecified morphologic changes: Secondary | ICD-10-CM

## 2013-12-20 DIAGNOSIS — M7989 Other specified soft tissue disorders: Secondary | ICD-10-CM | POA: Diagnosis present

## 2013-12-20 DIAGNOSIS — Z992 Dependence on renal dialysis: Secondary | ICD-10-CM

## 2013-12-20 DIAGNOSIS — D62 Acute posthemorrhagic anemia: Secondary | ICD-10-CM

## 2013-12-20 DIAGNOSIS — G929 Unspecified toxic encephalopathy: Secondary | ICD-10-CM | POA: Diagnosis present

## 2013-12-20 DIAGNOSIS — D638 Anemia in other chronic diseases classified elsewhere: Secondary | ICD-10-CM | POA: Diagnosis present

## 2013-12-20 DIAGNOSIS — G92 Toxic encephalopathy: Secondary | ICD-10-CM | POA: Diagnosis present

## 2013-12-20 DIAGNOSIS — F0390 Unspecified dementia without behavioral disturbance: Secondary | ICD-10-CM | POA: Diagnosis present

## 2013-12-20 DIAGNOSIS — N186 End stage renal disease: Secondary | ICD-10-CM | POA: Diagnosis present

## 2013-12-20 DIAGNOSIS — D696 Thrombocytopenia, unspecified: Secondary | ICD-10-CM | POA: Diagnosis present

## 2013-12-20 DIAGNOSIS — G928 Other toxic encephalopathy: Secondary | ICD-10-CM | POA: Diagnosis present

## 2013-12-20 DIAGNOSIS — Z79899 Other long term (current) drug therapy: Secondary | ICD-10-CM

## 2013-12-20 DIAGNOSIS — N390 Urinary tract infection, site not specified: Principal | ICD-10-CM | POA: Diagnosis present

## 2013-12-20 DIAGNOSIS — R627 Adult failure to thrive: Secondary | ICD-10-CM | POA: Diagnosis present

## 2013-12-20 DIAGNOSIS — D631 Anemia in chronic kidney disease: Secondary | ICD-10-CM | POA: Diagnosis present

## 2013-12-20 DIAGNOSIS — B9689 Other specified bacterial agents as the cause of diseases classified elsewhere: Secondary | ICD-10-CM | POA: Diagnosis present

## 2013-12-20 LAB — URINALYSIS, ROUTINE W REFLEX MICROSCOPIC
GLUCOSE, UA: NEGATIVE mg/dL
Ketones, ur: NEGATIVE mg/dL
Nitrite: NEGATIVE
PH: 7 (ref 5.0–8.0)
Protein, ur: >300 mg/dL — AB
Specific Gravity, Urine: 1.018 (ref 1.005–1.030)
Urobilinogen, UA: 0.2 mg/dL (ref 0.0–1.0)

## 2013-12-20 LAB — COMPREHENSIVE METABOLIC PANEL
ALK PHOS: 81 U/L (ref 39–117)
ALT: 8 U/L (ref 0–35)
AST: 14 U/L (ref 0–37)
Albumin: 2.4 g/dL — ABNORMAL LOW (ref 3.5–5.2)
BUN: 38 mg/dL — AB (ref 6–23)
CO2: 22 mEq/L (ref 19–32)
CREATININE: 4.82 mg/dL — AB (ref 0.50–1.10)
Calcium: 8.6 mg/dL (ref 8.4–10.5)
Chloride: 97 mEq/L (ref 96–112)
GFR calc non Af Amer: 8 mL/min — ABNORMAL LOW (ref >90)
GFR, EST AFRICAN AMERICAN: 9 mL/min — AB (ref >90)
Glucose, Bld: 102 mg/dL — ABNORMAL HIGH (ref 70–99)
Potassium: 3.9 mEq/L (ref 3.7–5.3)
Sodium: 136 mEq/L — ABNORMAL LOW (ref 137–147)
Total Bilirubin: 0.5 mg/dL (ref 0.3–1.2)
Total Protein: 7.2 g/dL (ref 6.0–8.3)

## 2013-12-20 LAB — CBC WITH DIFFERENTIAL/PLATELET
BASOS PCT: 0 % (ref 0–1)
Basophils Absolute: 0 10*3/uL (ref 0.0–0.1)
EOS ABS: 0.1 10*3/uL (ref 0.0–0.7)
EOS PCT: 1 % (ref 0–5)
HEMATOCRIT: 30.4 % — AB (ref 36.0–46.0)
HEMOGLOBIN: 9.7 g/dL — AB (ref 12.0–15.0)
Lymphocytes Relative: 7 % — ABNORMAL LOW (ref 12–46)
Lymphs Abs: 1 10*3/uL (ref 0.7–4.0)
MCH: 29.2 pg (ref 26.0–34.0)
MCHC: 31.9 g/dL (ref 30.0–36.0)
MCV: 91.6 fL (ref 78.0–100.0)
MONO ABS: 0.5 10*3/uL (ref 0.1–1.0)
MONOS PCT: 3 % (ref 3–12)
Neutro Abs: 12.7 10*3/uL — ABNORMAL HIGH (ref 1.7–7.7)
Neutrophils Relative %: 89 % — ABNORMAL HIGH (ref 43–77)
Platelets: 178 10*3/uL (ref 150–400)
RBC: 3.32 MIL/uL — AB (ref 3.87–5.11)
RDW: 16.8 % — ABNORMAL HIGH (ref 11.5–15.5)
WBC: 14.3 10*3/uL — ABNORMAL HIGH (ref 4.0–10.5)

## 2013-12-20 LAB — URINE MICROSCOPIC-ADD ON

## 2013-12-20 LAB — TROPONIN I

## 2013-12-20 MED ORDER — DEXTROSE 5 % IV SOLN
1.0000 g | Freq: Once | INTRAVENOUS | Status: AC
  Administered 2013-12-20: 1 g via INTRAVENOUS
  Filled 2013-12-20: qty 10

## 2013-12-20 MED ORDER — ACETAMINOPHEN 325 MG PO TABS
650.0000 mg | ORAL_TABLET | Freq: Once | ORAL | Status: AC
  Administered 2013-12-20: 650 mg via ORAL
  Filled 2013-12-20: qty 2

## 2013-12-20 NOTE — H&P (Signed)
[facility] History and Physical  [name] ZOX:[id number] DOB: 10/04/1931 DOA: 12/20/2013  Referring physician: ED physician PCP: Fredirick MaudlinHAWKINS,EDWARD L, MD   Chief Complaint: Confusion   HPI:  Pt is 80 yo female with ESRD, HD MWF in , [facility] for further evaluation of progressive confusion. Please note that pt is somewhat poor historian and most of the details provided by her two sons at bedside. They explain that pt has been progressively confused over the past several days, poor oral intake and failure to thrive. They also reports fevers as high as 101 F, sweats, malodorous urine. Pt still make little bit of urine per sons. Pt currently denies chest pain or shortness of breath, no specific abdominal concerns. Per sons' report, no known concerns of urinary urgency or dysuria, frequency, no flank pain noted.   In ED, pt is hemodynamically stable, UA worrisome for UTI, [facility] asked to admit to medical floor for further evaluation.   Assessment and Plan: Active Problems: Altered mental status - possibly related to UTI - will admit to med-surg unit - will place on Rocephin and will follow upon urine cultures - pt is currently hemodynamically stable and oriented to name and knows she is in the hospital  ESRD - HD MWF - per nephrology team  Anemia of chronic kidney disease - no signs of active bleed - will check CBC in AM Leukocytosis - secondary to UTI - ABX as noted above - CBC in AM  Code Status: Full Family Communication: Pt and two sons at bedside Disposition Plan: Admit to medical floor   Review of Systems:  Constitutional: Per HPI  HENT: Negative for hearing loss, ear pain, nosebleeds, congestion, sore throat, neck pain, tinnitus and ear discharge.   Eyes: Negative for blurred vision, double vision, photophobia, pain, discharge and redness.  Respiratory: Negative for cough, hemoptysis, sputum production, shortness of breath, wheezing and stridor.    Cardiovascular: Negative for chest pain, palpitations, orthopnea, claudication and leg swelling.  Gastrointestinal: Negative for heartburn, constipation, blood in stool and melena.  Genitourinary: Per HPI  Musculoskeletal: Negative for myalgias, back pain, joint pain and falls.  Skin: Negative for itching and rash.  Neurological: Negative for tingling, tremors, sensory change, speech change, focal weakness, loss of consciousness and headaches.  Endo/Heme/Allergies: Negative for environmental allergies and polydipsia. Does not bruise/bleed easily.  Psychiatric/Behavioral: Negative for suicidal ideas. The patient is not nervous/anxious.      Past Medical History  Diagnosis Date  . Renal disorder   . Shortness of breath     with exertion  . Pneumonia 05/2013  . Mental disorder     dementia - short term memory  . History of blood transfusion     after fistula ruptured  . Dementia     Past Surgical History  Procedure Laterality Date  . Dialysis fistula creation    . Ligation of arteriovenous  fistula Left 06/04/2013    Procedure: LIGATION OF ARTERIOVENOUS  FISTULA;  Surgeon: Fransisco HertzBrian L Chen, MD;  [facility];  Service: Vascular;  Laterality: Left;  . Insertion of dialysis catheter Right 06/04/2013    Procedure: INSERTION OF DIALYSIS CATHETER;  Surgeon: Fransisco HertzBrian L Chen, MD;  [facility];  Service: Vascular;  Laterality: Right;  . Cholecystectomy  1992  . Av fistula placement Right 07/07/2013    Procedure: ARTERIOVENOUS (AV) FISTULA CREATION - RIGHT BRACHIAL CEPHALIC ;  Surgeon: Fransisco HertzBrian L Chen, MD;  [facility];  Service: Vascular;  Laterality: Right;  .  Removal of a dialysis catheter Right 11/09/2013    Procedure: REMOVAL OF A DIALYSIS CATHETER- Right TDC ;  Surgeon: Fransisco Hertz, MD;  Location: [facility] OR;  Service: Vascular;  Laterality: Right;  . Insertion of dialysis catheter Left 11/09/2013    Procedure: INSERTION OF DIALYSIS CATHETER- Left TDC;  Surgeon: Fransisco Hertz, MD;  Location:  [facility] OR;  Service: Vascular;  Laterality: Left;  . Exchange of a dialysis catheter Left 12/17/2013    Procedure: EXCHANGE OF A DIALYSIS CATHETER;  Surgeon: Chuck Hint, MD;  Location: [facility] OR;  Service: Vascular;  Laterality: Left;  . Fistulogram Right 12/17/2013    Procedure: FISTULOGRAM- RIGHT ARM- POSSIBLE INTERVENTION;  Surgeon: Chuck Hint, MD;  Location: [facility] OR;  Service: Vascular;  Laterality: Right;  . Ligation of competing branches of arteriovenous fistula Right 12/17/2013    Procedure: LIGATION OF COMPETING BRANCHES OF ARTERIOVENOUS FISTULA;  Surgeon: Chuck Hint, MD;  Location: [facility] OR;  Service: Vascular;  Laterality: Right;    Social History:  reports that she has never smoked. She has never used smokeless tobacco. She reports that she does not drink alcohol or use illicit drugs.  Allergies  Allergen Reactions  . Sulfa Antibiotics Nausea And Vomiting and Rash  . Sulfur Nausea And Vomiting and Rash   No known family medical history  Prior to Admission medications   Medication Sig Start Date End Date Taking? Authorizing Provider  donepezil (ARICEPT) 10 MG tablet Take 10 mg by mouth at bedtime.   Yes Historical Provider, MD  ferrous sulfate 325 (65 FE) MG tablet Take 1 tablet (325 mg total) by mouth 2 (two) times daily with a meal. 06/05/13  Yes Renae Fickle, MD  Memantine HCl ER (NAMENDA XR) 28 MG CP24 Take 28 mg by mouth daily.   Yes Historical Provider, MD  multivitamin (RENA-VIT) TABS tablet Take 1 tablet by mouth daily.   Yes Historical Provider, MD  sevelamer carbonate (RENVELA) 800 MG tablet Take 800 mg by mouth 3 (three) times daily with meals.   Yes Historical Provider, MD  torsemide (DEMADEX) 20 MG tablet Take 20 mg by mouth daily.   Yes Historical Provider, MD    Physical Exam: Filed Vitals:   12/20/13 2037 12/20/13 2144  BP: 139/75 131/83  Pulse: 112 108  Temp: 102.4 F (39.1 C)   TempSrc: Oral   Resp: 16 17  SpO2: 99% 100%     Physical Exam  Constitutional: Appears well-developed and well-nourished. No distress.  HENT: Normocephalic. External right and left ear normal. Dry MM Eyes: Conjunctivae and EOM are normal. PERRLA, no scleral icterus.  Neck: Normal ROM. Neck supple. No JVD. No tracheal deviation. No thyromegaly.  CVS: Regular rhythm, tachycardic, S1/S2 +, no murmurs, no gallops, no carotid bruit.  Pulmonary: Effort and breath sounds normal, no stridor, rhonchi, diminished breath sounds at bases  Abdominal: Soft. BS +,  no distension, tenderness, rebound or guarding.  Musculoskeletal: Normal range of motion. No edema and no tenderness.  Lymphadenopathy: No lymphadenopathy noted, cervical, inguinal. Neuro: Alert and oriented to name and place (does not know the name of the hospital). Normal reflexes, muscle tone coordination. No cranial nerve deficit. Skin: Skin is warm and dry. No rash noted. Not diaphoretic. No erythema. No pallor.  Psychiatric: Normal mood and affect.  Labs on Admission:  Basic Metabolic Panel:  Recent Labs Lab 12/17/13 0937 12/20/13 2212  NA 138 136*  K 3.6* 3.9  CL 100 97  CO2  --  22  GLUCOSE 84 102*  BUN 44* 38*  CREATININE 5.00* 4.82*  CALCIUM  --  8.6   Liver Function Tests:  Recent Labs Lab 12/20/13 2212  AST 14  ALT 8  ALKPHOS 81  BILITOT 0.5  PROT 7.2  ALBUMIN 2.4*   CBC:  Recent Labs Lab 12/17/13 0937 12/20/13 2212  WBC  --  14.3*  NEUTROABS  --  12.7*  HGB 11.9* 9.7*  HCT 35.0* 30.4*  MCV  --  91.6  PLT  --  178   Cardiac Enzymes:  Recent Labs Lab 12/20/13 2212  TROPONINI <0.30    Radiological Exams on Admission: Dg Chest 2 View   12/20/2013 Pulmonary edema and small pleural effusions.    Ct Head Wo Contrast   12/20/2013  No acute finding.   EKG: Normal sinus rhythm, no ST/T wave changes  Debbora Presto, MD  [facility] Pager [phone]  If 7PM-7AM, please contact night-coverage [email or web address] Password [email or web address] 12/20/2013,  11:06 PM

## 2013-12-20 NOTE — ED Notes (Signed)
This RN attempted to collect labs, unsuccessful x2. Phlebotomy paged.

## 2013-12-20 NOTE — ED Notes (Signed)
Admitting physician at bedside

## 2013-12-20 NOTE — ED Notes (Signed)
Phlebotomy at bedside.

## 2013-12-20 NOTE — ED Notes (Signed)
PER EMS: pt from home, presents 2+ pitting edema of right arm and hand, and swelling of left leg. Pt is a dialysis patient, port access but family reports the port sites are changed often. Pt alert to self but not place, time, or situation. Pt family reports pt has dementia. Was scheduled for exam "fistulagram" to determine why she was swelling, which was done Thursday,  and family reports they still dont know the cause of the swelling. VS: BP-126/88, HR-117, 144 CBG, denies pain. Family reports patient had trouble standing and reports fever of 102.7

## 2013-12-20 NOTE — ED Notes (Signed)
Dr Sheldon at bedside  

## 2013-12-20 NOTE — ED Provider Notes (Signed)
CSN: [id number]     Arrival date & time 12/20/13  2016 History   First MD Initiated Contact with Patient 12/20/13 2019     Chief Complaint  Patient presents with  . Altered Mental Status   (Consider location/radiation/quality/duration/timing/severity/associated sxs/prior Treatment) Patient is a 80 y.o. female presenting with altered mental status.  Altered Mental Status  Level 5 caveat due to dementia and confusion Pt brought to the ED via EMS from home. She has multiple medical problems including ESRD on HD, has had difficulty with her dialysis access recently including fistula revision in RUE and adjustment of L subclavian vascath last week. Per family she began running a fever today and has had some mild increase in confusion. She has not had a cough, no vomiting. Makes urine occasionally. Several family members have had flu-like symptoms recently.    Past Medical History  Diagnosis Date  . Renal disorder   . Shortness of breath     with exertion  . Pneumonia 05/2013  . Mental disorder     dementia - short term memory  . History of blood transfusion     after fistula ruptured  . Dementia    Past Surgical History  Procedure Laterality Date  . Dialysis fistula creation    . Ligation of arteriovenous  fistula Left 06/04/2013    Procedure: LIGATION OF ARTERIOVENOUS  FISTULA;  Surgeon: Fransisco HertzBrian L Chen, MD;  [facility];  Service: Vascular;  Laterality: Left;  . Insertion of dialysis catheter Right 06/04/2013    Procedure: INSERTION OF DIALYSIS CATHETER;  Surgeon: Fransisco HertzBrian L Chen, MD;  [facility];  Service: Vascular;  Laterality: Right;  . Cholecystectomy  1992  . Av fistula placement Right 07/07/2013    Procedure: ARTERIOVENOUS (AV) FISTULA CREATION - RIGHT BRACHIAL CEPHALIC ;  Surgeon: Fransisco HertzBrian L Chen, MD;  [facility];  Service: Vascular;  Laterality: Right;  . Removal of a dialysis catheter Right 11/09/2013    Procedure: REMOVAL OF A DIALYSIS CATHETER- Right TDC ;  Surgeon: Fransisco HertzBrian  L Chen, MD;  [facility];  Service: Vascular;  Laterality: Right;  . Insertion of dialysis catheter Left 11/09/2013    Procedure: INSERTION OF DIALYSIS CATHETER- Left TDC;  Surgeon: Fransisco HertzBrian L Chen, MD;  [facility];  Service: Vascular;  Laterality: Left;  . Exchange of a dialysis catheter Left 12/17/2013    Procedure: EXCHANGE OF A DIALYSIS CATHETER;  Surgeon: Chuck Hinthristopher S Dickson, MD;  [facility];  Service: Vascular;  Laterality: Left;  . Fistulogram Right 12/17/2013    Procedure: FISTULOGRAM- RIGHT ARM- POSSIBLE INTERVENTION;  Surgeon: Chuck Hinthristopher S Dickson, MD;  [facility];  Service: Vascular;  Laterality: Right;  . Ligation of competing branches of arteriovenous fistula Right 12/17/2013    Procedure: LIGATION OF COMPETING BRANCHES OF ARTERIOVENOUS FISTULA;  Surgeon: Chuck Hinthristopher S Dickson, MD;  [facility];  Service: Vascular;  Laterality: Right;   No family history on file. History  Substance Use Topics  . Smoking status: Never Smoker   . Smokeless tobacco: Never Used  . Alcohol Use: No   OB History   Grav Para Term Preterm Abortions TAB SAB Ect Mult Living                 Review of Systems Unable to assess due to mental status.    Allergies  Sulfa antibiotics and Sulfur  Home Medications   Current Outpatient Rx  Name  Route  Sig  Dispense  Refill  .  diazepam (VALIUM) 2 MG tablet   Oral   Take 2 mg by mouth every 6 (six) hours as needed for anxiety (on dialysis days).         . donepezil (ARICEPT) 10 MG tablet   Oral   Take 10 mg by mouth at bedtime.         . ferrous sulfate 325 (65 FE) MG tablet   Oral   Take 1 tablet (325 mg total) by mouth 2 (two) times daily with a meal.   60 tablet   0   . memantine (NAMENDA) 10 MG tablet   Oral   Take 10 mg by mouth 2 (two) times daily. For memory         . multivitamin (RENA-VIT) TABS tablet   Oral   Take 1 tablet by mouth daily.         [name] oxyCODONE (ROXICODONE) 5 MG immediate release  tablet   Oral   Take 1 tablet (5 mg total) by mouth every 4 (four) hours as needed for severe pain.   15 tablet   0   . oxyCODONE-acetaminophen (ROXICET) 5-325 MG per tablet   Oral   Take 1-2 tablets by mouth every 4 (four) hours as needed for severe pain.   20 tablet   0   . sevelamer carbonate (RENVELA) 800 MG tablet   Oral   Take 800 mg by mouth 3 (three) times daily with meals.         . torsemide (DEMADEX) 20 MG tablet   Oral   Take 20 mg by mouth daily.          BP 139/75  Pulse 112  Temp(Src) 102.4 F (39.1 C) (Oral)  Resp 16  SpO2 99% Physical Exam  Nursing note and vitals reviewed. Constitutional: She appears well-developed and well-nourished.  HENT:  Head: Normocephalic and atraumatic.  Eyes: EOM are normal. Pupils are equal, round, and reactive to light.  Neck: Normal range of motion. Neck supple.  Cardiovascular: Normal rate, normal heart sounds and intact distal pulses.   Pulmonary/Chest: Effort normal and breath sounds normal.  Abdominal: Bowel sounds are normal. She exhibits no distension. There is no tenderness.  Musculoskeletal: Normal range of motion. She exhibits edema (edem of RUE and LLE at baseline per family, thrill palpated in RUE fistula). She exhibits no tenderness.  Neurological: She is alert. She has normal strength. No cranial nerve deficit or sensory deficit.  Skin: Skin is warm and dry. No rash noted.  Psychiatric: She has a normal mood and affect.    ED Course  Procedures (including critical care time) Labs Review Labs Reviewed  CBC WITH DIFFERENTIAL - Abnormal; Notable for the following:    WBC 14.3 (*)    RBC 3.32 (*)    Hemoglobin 9.7 (*)    HCT 30.4 (*)    RDW 16.8 (*)    Neutrophils Relative % 89 (*)    Neutro Abs 12.7 (*)    Lymphocytes Relative 7 (*)    All other components within normal limits  COMPREHENSIVE METABOLIC PANEL - Abnormal; Notable for the following:    Sodium 136 (*)    Glucose, Bld 102 (*)    BUN 38  (*)    Creatinine, Ser 4.82 (*)    Albumin 2.4 (*)    GFR calc non Af Amer 8 (*)    GFR calc Af Amer 9 (*)    All other components within normal limits  URINALYSIS, ROUTINE W  REFLEX MICROSCOPIC - Abnormal; Notable for the following:    Color, Urine AMBER (*)    APPearance TURBID (*)    Hgb urine dipstick LARGE (*)    Bilirubin Urine SMALL (*)    Protein, ur >300 (*)    Leukocytes, UA LARGE (*)    All other components within normal limits  URINE MICROSCOPIC-ADD ON - Abnormal; Notable for the following:    Squamous Epithelial / LPF MANY (*)    Bacteria, UA MANY (*)    All other components within normal limits  URINE CULTURE  TROPONIN I   Imaging Review Dg Chest 2 View  12/20/2013   CLINICAL DATA:  Confusion.  End-stage renal disease.  EXAM: CHEST  2 VIEW  COMPARISON:  12/17/2013  FINDINGS: Left IJ dialysis catheter, catheter tips extending into the hepatic cava. Left subclavian vascular stenting.  Chronic cardiomegaly. Small bilateral pleural effusions, left larger than right. Diffuse interstitial coarsening, above baseline. No asymmetric opacity. No pneumothorax.  IMPRESSION: Pulmonary edema and small pleural effusions.   Electronically Signed   By: Tiburcio Pea M.D.   On: 12/20/2013 21:21   Ct Head Wo Contrast  12/20/2013   CLINICAL DATA:  Altered mental status.  EXAM: CT HEAD WITHOUT CONTRAST  TECHNIQUE: Contiguous axial images were obtained from the base of the skull through the vertex without intravenous contrast.  COMPARISON:  None.  FINDINGS: There is cortical atrophy and some chronic microvascular ischemic change. No evidence of acute intracranial abnormality including infarction, hemorrhage, mass lesion, mass effect, midline shift or abnormal extra-axial fluid collection. Chronic right maxillary sinus disease is seen. The calvarium is intact. No pneumocephalus or hydrocephalus.  IMPRESSION: No acute finding.   Electronically Signed   By: Drusilla Kanner M.D.   On: 12/20/2013 21:29     EKG duplicated in MUSE, cannot read it there.    Date: 12/20/2013  Rate: 111  Rhythm: sinus tachycardia  QRS Axis: left  Intervals: normal  ST/T Wave abnormalities: nonspecific T wave changes  Conduction Disutrbances:none  Narrative Interpretation:   Old EKG Reviewed: unchanged    MDM   1. UTI (urinary tract infection)     Labs and imaging results reviewed. Pt has UTI as likely source of fever. Treated with Rocephin, no signs of wound infection. Discussed with Hospitalist who will admit.     Charles B. Bernette Mayers, MD 12/20/13 [phone]

## 2013-12-21 ENCOUNTER — Telehealth: Payer: Self-pay | Admitting: Vascular Surgery

## 2013-12-21 ENCOUNTER — Encounter (HOSPITAL_COMMUNITY): Payer: Self-pay | Admitting: *Deleted

## 2013-12-21 DIAGNOSIS — N186 End stage renal disease: Secondary | ICD-10-CM

## 2013-12-21 DIAGNOSIS — N390 Urinary tract infection, site not specified: Principal | ICD-10-CM

## 2013-12-21 LAB — BASIC METABOLIC PANEL
BUN: 42 mg/dL — ABNORMAL HIGH (ref 6–23)
CO2: 22 meq/L (ref 19–32)
CREATININE: 5.33 mg/dL — AB (ref 0.50–1.10)
Calcium: 8.2 mg/dL — ABNORMAL LOW (ref 8.4–10.5)
Chloride: 97 mEq/L (ref 96–112)
GFR calc Af Amer: 8 mL/min — ABNORMAL LOW (ref >90)
GFR calc non Af Amer: 7 mL/min — ABNORMAL LOW (ref >90)
GLUCOSE: 72 mg/dL (ref 70–99)
Potassium: 3.8 mEq/L (ref 3.7–5.3)
Sodium: 137 mEq/L (ref 137–147)

## 2013-12-21 LAB — CBC
HEMATOCRIT: 27 % — AB (ref 36.0–46.0)
Hemoglobin: 8.9 g/dL — ABNORMAL LOW (ref 12.0–15.0)
MCH: 30.3 pg (ref 26.0–34.0)
MCHC: 33 g/dL (ref 30.0–36.0)
MCV: 91.8 fL (ref 78.0–100.0)
Platelets: DECREASED 10*3/uL (ref 150–400)
RBC: 2.94 MIL/uL — ABNORMAL LOW (ref 3.87–5.11)
RDW: 17.1 % — AB (ref 11.5–15.5)
WBC: 10.7 10*3/uL — AB (ref 4.0–10.5)

## 2013-12-21 MED ORDER — ONDANSETRON HCL 4 MG PO TABS
4.0000 mg | ORAL_TABLET | Freq: Four times a day (QID) | ORAL | Status: DC | PRN
Start: 2013-12-21 — End: 2013-12-23

## 2013-12-21 MED ORDER — DEXTROSE 5 % IV SOLN
1.0000 g | INTRAVENOUS | Status: DC
  Administered 2013-12-21 – 2013-12-23 (×3): 1 g via INTRAVENOUS
  Filled 2013-12-21 (×3): qty 10

## 2013-12-21 MED ORDER — SODIUM CHLORIDE 0.9 % IJ SOLN
3.0000 mL | Freq: Two times a day (BID) | INTRAMUSCULAR | Status: DC
  Administered 2013-12-21 – 2013-12-22 (×4): 3 mL via INTRAVENOUS

## 2013-12-21 MED ORDER — HYDROCODONE-ACETAMINOPHEN 5-325 MG PO TABS: 1.0000 | ORAL_TABLET | ORAL | Status: DC | PRN

## 2013-12-21 MED ORDER — SEVELAMER CARBONATE 800 MG PO TABS
800.0000 mg | ORAL_TABLET | Freq: Three times a day (TID) | ORAL | Status: DC
  Administered 2013-12-21 – 2013-12-23 (×6): 800 mg via ORAL
  Filled 2013-12-21 (×10): qty 1

## 2013-12-21 MED ORDER — TORSEMIDE 20 MG PO TABS
20.0000 mg | ORAL_TABLET | Freq: Every day | ORAL | Status: DC
  Administered 2013-12-21 – 2013-12-23 (×3): 20 mg via ORAL
  Filled 2013-12-21 (×3): qty 1

## 2013-12-21 MED ORDER — MEMANTINE HCL ER 28 MG PO CP24
28.0000 mg | ORAL_CAPSULE | Freq: Every day | ORAL | Status: DC
  Administered 2013-12-21 – 2013-12-22 (×2): 28 mg via ORAL
  Filled 2013-12-21 (×3): qty 28

## 2013-12-21 MED ORDER — ONDANSETRON HCL 4 MG/2ML IJ SOLN: 4.0000 mg | Freq: Four times a day (QID) | INTRAMUSCULAR | Status: DC | PRN

## 2013-12-21 MED ORDER — RENA-VITE PO TABS
1.0000 | ORAL_TABLET | Freq: Every day | ORAL | Status: DC
  Administered 2013-12-21 – 2013-12-22 (×2): via ORAL
  Filled 2013-12-21 (×3): qty 1

## 2013-12-21 MED ORDER — HEPARIN SODIUM (PORCINE) 5000 UNIT/ML IJ SOLN
5000.0000 [IU] | Freq: Three times a day (TID) | INTRAMUSCULAR | Status: DC
  Administered 2013-12-21 – 2013-12-23 (×7): 5000 [IU] via SUBCUTANEOUS
  Filled 2013-12-21 (×10): qty 1

## 2013-12-21 MED ORDER — FERROUS SULFATE 325 (65 FE) MG PO TABS
325.0000 mg | ORAL_TABLET | Freq: Two times a day (BID) | ORAL | Status: DC
  Administered 2013-12-21 – 2013-12-23 (×5): 325 mg via ORAL
  Filled 2013-12-21 (×7): qty 1

## 2013-12-21 MED ORDER — DONEPEZIL HCL 10 MG PO TABS
10.0000 mg | ORAL_TABLET | Freq: Every day | ORAL | Status: DC
  Administered 2013-12-21 – 2013-12-22 (×2): 10 mg via ORAL
  Filled 2013-12-21 (×4): qty 1

## 2013-12-21 NOTE — Progress Notes (Signed)
[facility] PROGRESS NOTE  [name] QIO:[id number] DOB: 01-19-1931 DOA: 12/20/2013 PCP: Deanna Maudlin, MD  Assessment/Plan:  Altered mental status  - Improved - possibly related to UTI   - will place on Rocephin and will follow upon urine cultures  - pt is currently hemodynamically stable and oriented to name and knows she is in the hospital   ESRD  - HD MWF  - Will call nephrology  Anemia of chronic kidney disease  - no signs of active bleed  - hb is 8.9 this morning  Leukocytosis  - secondary to UTI  - ABX as noted above     Code Status: Full code Family Communication: * Disposition Plan: Home when stable   Consultants:  None  Procedures:  None  Antibiotics:  Rocephin  HPI/Subjective: Patient seen and examine, admitted with altered mental status likely due to UTI. Started on IV Rocephin.  Objective: Filed Vitals:   12/21/13 1410  BP: 107/53  Pulse: 86  Temp: 98.5 F (36.9 C)  Resp: 18    Intake/Output Summary (Last 24 hours) at 12/21/13 1540 Last data filed at 12/21/13 1510  Gross per 24 hour  Intake    240 ml  Output      2 ml  Net    238 ml   Filed Weights   12/21/13 0108  Weight: 62.4 kg (137 lb 9.1 oz)    Exam:  Physical Exam: Head: Normocephalic, atraumatic.  Eyes: No signs of jaundice, EOMI Nose: Mucous membranes dry.  Throat: Oropharynx nonerythematous, no exudate appreciated.  Neck: supple,No deformities, masses, or tenderness noted. Lungs: Normal respiratory effort. B/L Clear to auscultation, no crackles or wheezes.  Heart: Regular RR. S1 and S2 normal  Abdomen: BS normoactive. Soft, Nondistended, non-tender.  Extremities: No pretibial edema, no erythema   Data Reviewed: Basic Metabolic Panel:  Recent Labs Lab 12/17/13 0937 12/20/13 2212 12/21/13 0530  NA 138 136* 137  K 3.6* 3.9 3.8  CL 100 97 97  CO2  --  22 22  GLUCOSE 84 102* 72  BUN 44* 38* 42*  CREATININE 5.00* 4.82* 5.33*  CALCIUM  --  8.6  8.2*   Liver Function Tests:  Recent Labs Lab 12/20/13 2212  AST 14  ALT 8  ALKPHOS 81  BILITOT 0.5  PROT 7.2  ALBUMIN 2.4*   No results found for this basename: LIPASE, AMYLASE,  in the last 168 hours No results found for this basename: AMMONIA,  in the last 168 hours CBC:  Recent Labs Lab 12/17/13 0937 12/20/13 2212 12/21/13 0530  WBC  --  14.3* 10.7*  NEUTROABS  --  12.7*  --   HGB 11.9* 9.7* 8.9*  HCT 35.0* 30.4* 27.0*  MCV  --  91.6 91.8  PLT  --  178 PLATELET CLUMPS NOTED ON SMEAR, COUNT APPEARS DECREASED   Cardiac Enzymes:  Recent Labs Lab 12/20/13 2212  TROPONINI <0.30   BNP (last 3 results) No results found for this basename: PROBNP,  in the last 8760 hours CBG: No results found for this basename: GLUCAP,  in the last 168 hours  No results found for this or any previous visit (from the past 240 hour(s)).   Studies: Dg Chest 2 View  12/20/2013   CLINICAL DATA:  Confusion.  End-stage renal disease.  EXAM: CHEST  2 VIEW  COMPARISON:  12/17/2013  FINDINGS: Left IJ dialysis catheter, catheter tips extending into the hepatic cava. Left subclavian vascular stenting.  Chronic cardiomegaly. Small bilateral pleural  effusions, left larger than right. Diffuse interstitial coarsening, above baseline. No asymmetric opacity. No pneumothorax.  IMPRESSION: Pulmonary edema and small pleural effusions.   Electronically Signed   By: Deanna PeaJonathan  [name] M.D.   On: 12/20/2013 21:21   Ct Head Wo Contrast  12/20/2013   CLINICAL DATA:  Altered mental status.  EXAM: CT HEAD WITHOUT CONTRAST  TECHNIQUE: Contiguous axial images were obtained from the base of the skull through the vertex without intravenous contrast.  COMPARISON:  None.  FINDINGS: There is cortical atrophy and some chronic microvascular ischemic change. No evidence of acute intracranial abnormality including infarction, hemorrhage, mass lesion, mass effect, midline shift or abnormal extra-axial fluid collection. Chronic right  maxillary sinus disease is seen. The calvarium is intact. No pneumocephalus or hydrocephalus.  IMPRESSION: No acute finding.   Electronically Signed   By: Deanna Kannerhomas  [name] M.D.   On: 12/20/2013 21:29    Scheduled Meds: . cefTRIAXone (ROCEPHIN)  IV  1 g Intravenous Q24H  . donepezil  10 mg Oral QHS  . ferrous sulfate  325 mg Oral BID WC  . heparin  5,000 Units Subcutaneous Q8H  . Memantine HCl ER  28 mg Oral Daily  . multivitamin  1 tablet Oral Daily  . sevelamer carbonate  800 mg Oral TID WC  . sodium chloride  3 mL Intravenous Q12H  . torsemide  20 mg Oral Daily   Continuous Infusions:   Active Problems:   UTI (lower urinary tract infection)    Time spent: 25 min    [facility] Center[facility],[name] S  [facility] Pager [phone]. If 7PM-7AM, please contact night-coverage at [email or web address], password [email or web address] 12/21/2013, 3:40 PM  LOS: 1 day

## 2013-12-21 NOTE — Telephone Encounter (Addendum)
Message copied by Fredrich BirksMILLIKAN, DANA P on Mon Dec 21, 2013  2:53 PM ------      Message from: Lorin MercyMCCHESNEY, MARILYN K      Created: Thu Dec 17, 2013  3:43 PM      Regarding: Schedule                   ----- Message -----         From: Chuck Hinthristopher S Dickson, MD         Sent: 12/17/2013   3:12 PM           To: [facility] Charge Pool      Subject: charge                                                   PROCEDURE:       1. Ultrasound-guided access to right brachiocephalic AV fistula      2. Fistulogram right brachiocephalic AV fistula      3. Venoplasty right cephalic vein stenosis      4. Ligation of competing branch right brachiocephalic AV fistula      5. Placement of left IJ 27 cm tunneled dialysis catheter            SURGEON: Di Kindlehristopher S. Edilia Boickson, MD, FACS            ASSIST: none            I believe that Dr. Imogene Burnhen noticed this patient better than I do so he should probably see the patient in 2-3 weeks to evaluate the fistula and determine when it can be used.            Thanks       CD ------  12/21/13: spoke with patients grandson to schedule appt for 01/08/14 @ 4:00. She is currently in the [facility] with a UTI, dpm

## 2013-12-22 LAB — BASIC METABOLIC PANEL
BUN: 52 mg/dL — AB (ref 6–23)
CALCIUM: 7.9 mg/dL — AB (ref 8.4–10.5)
CO2: 21 mEq/L (ref 19–32)
Chloride: 94 mEq/L — ABNORMAL LOW (ref 96–112)
Creatinine, Ser: 6.24 mg/dL — ABNORMAL HIGH (ref 0.50–1.10)
GFR calc Af Amer: 7 mL/min — ABNORMAL LOW (ref >90)
GFR, EST NON AFRICAN AMERICAN: 6 mL/min — AB (ref >90)
GLUCOSE: 89 mg/dL (ref 70–99)
Potassium: 4.1 mEq/L (ref 3.7–5.3)
SODIUM: 133 meq/L — AB (ref 137–147)

## 2013-12-22 LAB — CBC
HEMATOCRIT: 27.6 % — AB (ref 36.0–46.0)
Hemoglobin: 8.9 g/dL — ABNORMAL LOW (ref 12.0–15.0)
MCH: 29.3 pg (ref 26.0–34.0)
MCHC: 32.2 g/dL (ref 30.0–36.0)
MCV: 90.8 fL (ref 78.0–100.0)
Platelets: 190 10*3/uL (ref 150–400)
RBC: 3.04 MIL/uL — ABNORMAL LOW (ref 3.87–5.11)
RDW: 17 % — ABNORMAL HIGH (ref 11.5–15.5)
WBC: 9.5 10*3/uL (ref 4.0–10.5)

## 2013-12-22 LAB — HEPATITIS B SURFACE ANTIGEN: HEP B S AG: NEGATIVE

## 2013-12-22 MED ORDER — ALTEPLASE 2 MG IJ SOLR
2.0000 mg | Freq: Once | INTRAMUSCULAR | Status: AC | PRN
  Filled 2013-12-22: qty 2

## 2013-12-22 MED ORDER — NEPRO/CARBSTEADY PO LIQD
237.0000 mL | ORAL | Status: DC | PRN
  Filled 2013-12-22: qty 237

## 2013-12-22 MED ORDER — HEPARIN SODIUM (PORCINE) 1000 UNIT/ML DIALYSIS
1000.0000 [IU] | INTRAMUSCULAR | Status: DC | PRN
  Filled 2013-12-22: qty 1

## 2013-12-22 MED ORDER — HEPARIN SODIUM (PORCINE) 1000 UNIT/ML DIALYSIS
20.0000 [IU]/kg | INTRAMUSCULAR | Status: DC | PRN
  Filled 2013-12-22: qty 2

## 2013-12-22 MED ORDER — DOXERCALCIFEROL 4 MCG/2ML IV SOLN
3.5000 ug | Freq: Once | INTRAVENOUS | Status: AC
  Administered 2013-12-22: 17:00:00 via INTRAVENOUS
  Filled 2013-12-22: qty 2

## 2013-12-22 MED ORDER — SODIUM CHLORIDE 0.9 % IV SOLN: 100.0000 mL | INTRAVENOUS | Status: DC | PRN

## 2013-12-22 MED ORDER — DOXERCALCIFEROL 4 MCG/2ML IV SOLN
INTRAVENOUS | Status: AC
  Filled 2013-12-22: qty 2

## 2013-12-22 NOTE — Consult Note (Signed)
[name] 12/22/2013 [name], [name], B Requesting Physician:  Deanna BinderIskra Myers  Reason for Consult:  ESRD care HPI:  60F ESRD via L IJ TDC MWF [place] [facility] admitted 12/20/13 with AMS, fevers 101F max, malodorous urine, leukocytosis.  Admitted and placed on ceftriaxone.  CT head w/o acute findings.  UA with TNC WBC and bactereia.  No recent problems with HD.  Pt with TDC currently and had fistulogram of RUA AVF last Thursday with [facility]. .  On 12/17/13 had collateral vein ligation and angioplasty of R cephalic vein.  Has not been used yet.  TDC exchanged because of low BFR at the same time.    ROS Balance of 12 systems is negative w/ exceptions as above  PMH  Past Medical History  Diagnosis Date  . Renal disorder   . Shortness of breath     with exertion  . Pneumonia 05/2013  . Mental disorder     dementia - short term memory  . History of blood transfusion     after fistula ruptured  . Dementia    PSH  Past Surgical History  Procedure Laterality Date  . Dialysis fistula creation    . Ligation of arteriovenous  fistula Left 06/04/2013    Procedure: LIGATION OF ARTERIOVENOUS  FISTULA;  Surgeon: Fransisco HertzBrian L Chen, MD;  [facility];  Service: Vascular;  Laterality: Left;  . Insertion of dialysis catheter Right 06/04/2013    Procedure: INSERTION OF DIALYSIS CATHETER;  Surgeon: Fransisco HertzBrian L Chen, MD;  [facility];  Service: Vascular;  Laterality: Right;  . Cholecystectomy  1992  . Av fistula placement Right 07/07/2013    Procedure: ARTERIOVENOUS (AV) FISTULA CREATION - RIGHT BRACHIAL CEPHALIC ;  Surgeon: Fransisco HertzBrian L Chen, MD;  [facility];  Service: Vascular;  Laterality: Right;  . Removal of a dialysis catheter Right 11/09/2013    Procedure: REMOVAL OF A DIALYSIS CATHETER- Right TDC ;  Surgeon: Fransisco HertzBrian L Chen, MD;  [facility];  Service: Vascular;  Laterality: Right;  . Insertion of dialysis catheter Left 11/09/2013    Procedure: INSERTION OF DIALYSIS CATHETER- Left TDC;  Surgeon: Fransisco HertzBrian L  Chen, MD;  [facility];  Service: Vascular;  Laterality: Left;  . Exchange of a dialysis catheter Left 12/17/2013    Procedure: EXCHANGE OF A DIALYSIS CATHETER;  Surgeon: Chuck Hinthristopher S Dickson, MD;  [facility];  Service: Vascular;  Laterality: Left;  . Fistulogram Right 12/17/2013    Procedure: FISTULOGRAM- RIGHT ARM- POSSIBLE INTERVENTION;  Surgeon: Chuck Hinthristopher S Dickson, MD;  [facility];  Service: Vascular;  Laterality: Right;  . Ligation of competing branches of arteriovenous fistula Right 12/17/2013    Procedure: LIGATION OF COMPETING BRANCHES OF ARTERIOVENOUS FISTULA;  Surgeon: Chuck Hinthristopher S Dickson, MD;  [facility];  Service: Vascular;  Laterality: Right;   FH History reviewed. No pertinent family history. SH  reports that she has never smoked. She has never used smokeless tobacco. She reports that she does not drink alcohol or use illicit drugs. Allergies  Allergies  Allergen Reactions  . Sulfa Antibiotics Nausea And Vomiting and Rash  . Sulfur Nausea And Vomiting and Rash   Home medications Prior to Admission medications   Medication Sig Start Date End Date Taking? Authorizing Provider  donepezil (ARICEPT) 10 MG tablet Take 10 mg by mouth at bedtime.   Yes Historical Provider, MD  ferrous sulfate 325 (65 FE) MG tablet Take 1 tablet (325 mg total) by mouth 2 (two) times daily with  a meal. 06/05/13  Yes Renae Fickle, MD  Memantine HCl ER (NAMENDA XR) 28 MG CP24 Take 28 mg by mouth daily.   Yes Historical Provider, MD  multivitamin (RENA-VIT) TABS tablet Take 1 tablet by mouth daily.   Yes Historical Provider, MD  sevelamer carbonate (RENVELA) 800 MG tablet Take 800 mg by mouth 3 (three) times daily with meals.   Yes Historical Provider, MD  torsemide (DEMADEX) 20 MG tablet Take 20 mg by mouth daily.   Yes Historical Provider, MD    Current Medications Current Facility-Administered Medications  Medication Dose Route Frequency Provider Last Rate Last Dose  .  cefTRIAXone (ROCEPHIN) 1 g in dextrose 5 % 50 mL IVPB  1 g Intravenous Q24H Dorothea Ogle, MD   1 g at 12/21/13 2144  . donepezil (ARICEPT) tablet 10 mg  10 mg Oral QHS Dorothea Ogle, MD   10 mg at 12/21/13 2143  . ferrous sulfate tablet 325 mg  325 mg Oral BID WC Dorothea Ogle, MD   325 mg at 12/21/13 1820  . heparin injection 5,000 Units  5,000 Units Subcutaneous Q8H Dorothea Ogle, MD   5,000 Units at 12/22/13 0534  . HYDROcodone-acetaminophen (NORCO/VICODIN) 5-325 MG per tablet 1-2 tablet  1-2 tablet Oral Q4H PRN Dorothea Ogle, MD      . Memantine HCl ER CP24 28 mg  28 mg Oral Daily Dorothea Ogle, MD   28 mg at 12/21/13 1153  . multivitamin (RENA-VIT) tablet 1 tablet  1 tablet Oral Daily Dorothea Ogle, MD      . ondansetron [facility]) tablet 4 mg  4 mg Oral Q6H PRN Dorothea Ogle, MD       Or  . ondansetron [facility]) injection 4 mg  4 mg Intravenous Q6H PRN Dorothea Ogle, MD      . sevelamer carbonate (RENVELA) tablet 800 mg  800 mg Oral TID WC Dorothea Ogle, MD   800 mg at 12/21/13 1819  . sodium chloride 0.9 % injection 3 mL  3 mL Intravenous Q12H Dorothea Ogle, MD   3 mL at 12/21/13 2144  . torsemide (DEMADEX) tablet 20 mg  20 mg Oral Daily Dorothea Ogle, MD   20 mg at 12/21/13 1155    CBC  Recent Labs Lab 12/20/13 2212 12/21/13 0530 12/22/13 0349  WBC 14.3* 10.7* 9.5  NEUTROABS 12.7*  --   --   HGB 9.7* 8.9* 8.9*  HCT 30.4* 27.0* 27.6*  MCV 91.6 91.8 90.8  PLT 178 PLATELET CLUMPS NOTED ON SMEAR, COUNT APPEARS DECREASED 190   Basic Metabolic Panel  Recent Labs Lab 12/17/13 0937 12/20/13 2212 12/21/13 0530 12/22/13 0349  NA 138 136* 137 133*  K 3.6* 3.9 3.8 4.1  CL 100 97 97 94*  CO2  --  22 22 21   GLUCOSE 84 102* 72 89  BUN 44* 38* 42* 52*  CREATININE 5.00* 4.82* 5.33* 6.24*  CALCIUM  --  8.6 8.2* 7.9*    Physical Exam  Blood pressure 122/53, pulse 95, temperature 99.2 F (37.3 C), temperature source Oral, resp. rate 18, height 5\' 1"  (1.549 m), weight 62.4 kg (137 lb  9.1 oz), SpO2 98.00%. GEN: NAD, awake, alert ,interactive ENT: NCAT EYES: EOMI CV: RRR. Nl s1s2 PULM: RRR, clear anteriorly ABD: s/nd/nd.  No suprapubictenderness SKIN: no rashes/lesions. L IJ TDC bandaged, nontender EXT:No LEE. R BC AVF +B/T   A/P 46F ESRD MWF via TDC at [facility] with  AMS, Fever, leukocytosis, UTI.    1. ESRD: provide maintenance HD today.  Obtaining records. Can have HD today and tomorrow (inpt or outpt) to resuem normal schedule.  Target EDW.   2. UTI: per primary team 3. Anemia: will cont outpt treatment in house 4.   2HPTH: Cont outpt treatment today.   Sabra Heck MD 12/22/2013, 9:29 AM

## 2013-12-22 NOTE — Clinical Documentation Improvement (Signed)
  Patient admitted with "Altered mental status". This is repeated multiple times in Notes. In the Coding world this term is considered nonspecific and low weighted. If possible, please clarify this term to illustrate this patient's severity of illness and risk of mortality. Thank you.  Possible Clinical Conditions?  Encephalopathy (describe type if known)                       Hypertensive                       Metabolic                       Toxic Drug induced delirium  Hyponatremia / Hypernatremia Poisoning / Overdose Other Condition  Supporting Information: - UTI - FTT, ESRD, IJ catheter  - Fever to 102.[name].4 - V Rocephin  Thank You, Beverley FiedlerLaurie E Dora Simeone ,RN Clinical Documentation Specialist:  [phone]  [facility]- Health Information Management

## 2013-12-22 NOTE — Procedures (Signed)
I was present at this dialysis session. I have reviewed the session itself and made appropriate changes.   Sabra Heckyan Yesena Reaves  MD 12/22/2013, 2:35 PM

## 2013-12-22 NOTE — Progress Notes (Signed)
[facility] PROGRESS NOTE  [name] ZOX:[id number] DOB: 07/03/1931 DOA: 12/20/2013 PCP: Fredirick MaudlinHAWKINS,EDWARD L, MD  Interval history 80 yo female with ESRD, HD MWF in [place], brought [facility] for further evaluation of progressive confusion. Please note that pt is somewhat poor historian and most of the details provided by her two sons at bedside. They explain that pt has been progressively confused over the past several days, poor oral intake and failure to thrive. They also reports fevers as high as 101 F, sweats, malodorous urine. Pt still make little bit of urine per sons. Pt currently denies chest pain or shortness of breath, no specific abdominal concerns. Per sons' report, no known concerns of urinary urgency or dysuria, frequency, no flank pain noted. Patient was started on IV antibiotics, the final urine culture is pending.   Assessment/Plan:  Altered mental status  - Improved - possibly related to UTI   - will place on Rocephin and will follow upon urine cultures  - pt is currently hemodynamically stable and oriented to name and knows she is in the hospital   UTI Continue with Rocephin Urine culture is pending WBC is down to normal.  ESRD  - HD MWF  - Will call nephrology  Anemia of chronic kidney disease  - no signs of active bleed  - hb is 8.9 this morning  Leukocytosis  - secondary to UTI  - ABX as noted above    Code Status: Full code Family Communication: * Disposition Plan: Home when stable   Consultants:  None  Procedures:  None  Antibiotics:  Rocephin  HPI/Subjective: Patient seen and examine, admitted with altered mental status likely due to UTI. Started on IV Rocephin. Mental status has improved, knows she is on hospital. Family member at bedside also feels that her mental status has improved.  Objective: Filed Vitals:   12/22/13 1358  BP: 103/56  Pulse: 82  Temp:   Resp:     Intake/Output Summary (Last 24 hours) at 12/22/13  1429 Last data filed at 12/22/13 1100  Gross per 24 hour  Intake    360 ml  Output      1 ml  Net    359 ml   Filed Weights   12/21/13 0108 12/22/13 1350  Weight: 62.4 kg (137 lb 9.1 oz) 65.4 kg (144 lb 2.9 oz)    Exam:  Physical Exam: Head: Normocephalic, atraumatic.  Eyes: No signs of jaundice, EOMI Nose: Mucous membranes dry.  Throat: Oropharynx nonerythematous, no exudate appreciated.  Neck: supple,No deformities, masses, or tenderness noted. Lungs: Normal respiratory effort. B/L Clear to auscultation, no crackles or wheezes.  Heart: Regular RR. S1 and S2 normal  Abdomen: BS normoactive. Soft, Nondistended, non-tender.  Extremities: No pretibial edema, no erythema   Data Reviewed: Basic Metabolic Panel:  Recent Labs Lab 12/17/13 0937 12/20/13 2212 12/21/13 0530 12/22/13 0349  NA 138 136* 137 133*  K 3.6* 3.9 3.8 4.1  CL 100 97 97 94*  CO2  --  22 22 21   GLUCOSE 84 102* 72 89  BUN 44* 38* 42* 52*  CREATININE 5.00* 4.82* 5.33* 6.24*  CALCIUM  --  8.6 8.2* 7.9*   Liver Function Tests:  Recent Labs Lab 12/20/13 2212  AST 14  ALT 8  ALKPHOS 81  BILITOT 0.5  PROT 7.2  ALBUMIN 2.4*   No results found for this basename: LIPASE, AMYLASE,  in the last 168 hours No results found for this basename: AMMONIA,  in the  last 168 hours CBC:  Recent Labs Lab 12/17/13 0937 12/20/13 2212 12/21/13 0530 12/22/13 0349  WBC  --  14.3* 10.7* 9.5  NEUTROABS  --  12.7*  --   --   HGB 11.9* 9.7* 8.9* 8.9*  HCT 35.0* 30.4* 27.0* 27.6*  MCV  --  91.6 91.8 90.8  PLT  --  178 PLATELET CLUMPS NOTED ON SMEAR, COUNT APPEARS DECREASED 190   Cardiac Enzymes:  Recent Labs Lab 12/20/13 2212  TROPONINI <0.30   BNP (last 3 results) No results found for this basename: PROBNP,  in the last 8760 hours CBG: No results found for this basename: GLUCAP,  in the last 168 hours  Recent Results (from the past 240 hour(s))  URINE CULTURE     Status: None   Collection Time     12/20/13  8:58 PM      Result Value Range Status   Specimen Description URINE, CATHETERIZED   Final   Special Requests NONE   Final   Culture  Setup Time     Final   Value: 12/20/2013 23:01     Performed at [facility] Count     Final   Value: 50,000 COLONIES/ML     Performed at [facility]   Culture     Final   Value: GRAM NEGATIVE RODS     Performed at [facility]   Report Status PENDING   Incomplete     Studies: Dg Chest 2 View  12/20/2013   CLINICAL DATA:  Confusion.  End-stage renal disease.  EXAM: CHEST  2 VIEW  COMPARISON:  12/17/2013  FINDINGS: Left IJ dialysis catheter, catheter tips extending into the hepatic cava. Left subclavian vascular stenting.  Chronic cardiomegaly. Small bilateral pleural effusions, left larger than right. Diffuse interstitial coarsening, above baseline. No asymmetric opacity. No pneumothorax.  IMPRESSION: Pulmonary edema and small pleural effusions.   Electronically Signed   By: Tiburcio Pea M.D.   On: 12/20/2013 21:21   Ct Head Wo Contrast  12/20/2013   CLINICAL DATA:  Altered mental status.  EXAM: CT HEAD WITHOUT CONTRAST  TECHNIQUE: Contiguous axial images were obtained from the base of the skull through the vertex without intravenous contrast.  COMPARISON:  None.  FINDINGS: There is cortical atrophy and some chronic microvascular ischemic change. No evidence of acute intracranial abnormality including infarction, hemorrhage, mass lesion, mass effect, midline shift or abnormal extra-axial fluid collection. Chronic right maxillary sinus disease is seen. The calvarium is intact. No pneumocephalus or hydrocephalus.  IMPRESSION: No acute finding.   Electronically Signed   By: Drusilla Kanner M.D.   On: 12/20/2013 21:29    Scheduled Meds: . cefTRIAXone (ROCEPHIN)  IV  1 g Intravenous Q24H  . donepezil  10 mg Oral QHS  . doxercalciferol  3.5 mcg Intravenous Once  . ferrous sulfate  325 mg Oral BID WC  . heparin  5,000  Units Subcutaneous Q8H  . Memantine HCl ER  28 mg Oral Daily  . multivitamin  1 tablet Oral Daily  . sevelamer carbonate  800 mg Oral TID WC  . sodium chloride  3 mL Intravenous Q12H  . torsemide  20 mg Oral Daily   Continuous Infusions:   Active Problems:   UTI (lower urinary tract infection)    Time spent: 25 min    [facility]  [facility] Pager [phone]. If 7PM-7AM, please contact night-coverage at [email or web address], password [email or web address] 12/22/2013, 2:29 PM  LOS: 2 days

## 2013-12-23 DIAGNOSIS — D638 Anemia in other chronic diseases classified elsewhere: Secondary | ICD-10-CM | POA: Diagnosis present

## 2013-12-23 DIAGNOSIS — F0390 Unspecified dementia without behavioral disturbance: Secondary | ICD-10-CM

## 2013-12-23 DIAGNOSIS — G92 Toxic encephalopathy: Secondary | ICD-10-CM | POA: Diagnosis present

## 2013-12-23 DIAGNOSIS — G928 Other toxic encephalopathy: Secondary | ICD-10-CM | POA: Diagnosis present

## 2013-12-23 LAB — BASIC METABOLIC PANEL
BUN: 30 mg/dL — ABNORMAL HIGH (ref 6–23)
CALCIUM: 7.8 mg/dL — AB (ref 8.4–10.5)
CO2: 24 mEq/L (ref 19–32)
Chloride: 96 mEq/L (ref 96–112)
Creatinine, Ser: 4.57 mg/dL — ABNORMAL HIGH (ref 0.50–1.10)
GFR, EST AFRICAN AMERICAN: 9 mL/min — AB (ref >90)
GFR, EST NON AFRICAN AMERICAN: 8 mL/min — AB (ref >90)
GLUCOSE: 84 mg/dL (ref 70–99)
Potassium: 3.6 mEq/L — ABNORMAL LOW (ref 3.7–5.3)
SODIUM: 136 meq/L — AB (ref 137–147)

## 2013-12-23 LAB — CBC
HCT: 26.6 % — ABNORMAL LOW (ref 36.0–46.0)
Hemoglobin: 8.6 g/dL — ABNORMAL LOW (ref 12.0–15.0)
MCH: 29.5 pg (ref 26.0–34.0)
MCHC: 32.3 g/dL (ref 30.0–36.0)
MCV: 91.1 fL (ref 78.0–100.0)
PLATELETS: 180 10*3/uL (ref 150–400)
RBC: 2.92 MIL/uL — AB (ref 3.87–5.11)
RDW: 16.9 % — ABNORMAL HIGH (ref 11.5–15.5)
WBC: 8.4 10*3/uL (ref 4.0–10.5)

## 2013-12-23 MED ORDER — CEFPODOXIME PROXETIL 200 MG PO TABS: 200.0000 mg | ORAL_TABLET | ORAL | Status: DC

## 2013-12-23 NOTE — Procedures (Signed)
I was present at this dialysis session. I have reviewed the session itself and made appropriate changes.   U Cx w/ GNRs.  On ceftriaxone.  Most HD units only have cefazolin or ceftazidime for cephalosporins.  ABX choice per [facility] and can assist with treatment.  Minimal UF today.    Sabra Heckyan Lunell Robart  MD 12/23/2013, 11:39 AM

## 2013-12-23 NOTE — Progress Notes (Addendum)
[facility] PROGRESS NOTE  [name] ZOX:[id number] DOB: 08/24/1931 DOA: 12/20/2013 PCP: Fredirick Maudlin, MD  Interval history 80 yo female with ESRD, HD MWF in [place], brought to [facility] ED for further evaluation of progressive confusion. Please note that pt is somewhat poor historian and most of the details provided by her two sons at bedside. They explain that pt has been progressively confused over the past several days, poor oral intake and failure to thrive. They also reports fevers as high as 101 F, sweats, malodorous urine. Pt still make little bit of urine per sons. Pt currently denies chest pain or shortness of breath, no specific abdominal concerns. Per sons' report, no known concerns of urinary urgency or dysuria, frequency, no flank pain noted. Patient was started on IV antibiotics, the urine culture is pending.   Assessment/Plan:  Toxic metabolic encephalopathy  - mentation improving - likely related to UTI   GNR UTI -Continue Rocephin, urine Cx with 50K colonies of GNR await speciation and sensitivity  -WBC normal now and afebrile   ESRD  - HD MWF  - renal following, HD yesterday and today  Anemia of chronic kidney disease  -continue Iron, needs erythropoietin defer to renal panel  H/o Dementia -continue aricept and namenda  R arm swelling -could be related to previous AVF related problems  -r/o DVT  Ambulate Pt/ Ot eval needed  Disposition Plan: Home when stable   Consultants:  None  Procedures:  None  Antibiotics:  Rocephin  HPI/Subjective: She denies any complaints, somewhat confused this am  Objective: Filed Vitals:   12/23/13 1345  BP: 111/52  Pulse: 83  Temp:   Resp: 20    Intake/Output Summary (Last 24 hours) at 12/23/13 1436 Last data filed at 12/23/13 1329  Gross per 24 hour  Intake    240 ml  Output   2009 ml  Net  -1769 ml   Filed Weights   12/22/13 1644 12/23/13 1045 12/23/13 1329  Weight: 64.1 kg (141 lb 5 oz)  63.9 kg (140 lb 14 oz) 62.5 kg (137 lb 12.6 oz)    Exam:  Gen: alert, awake, oriented to self and partly to place only Head: Normocephalic, atraumatic.  Eyes: No signs of jaundice, EOMI Chest: [facility] with dressing Neck: supple,No deformities, masses, or tenderness noted. Lungs: Normal respiratory effort. B/L Clear to auscultation, no crackles or wheezes.  Heart: Regular RR. S1 and S2 normal  Abdomen: BS normoactive. Soft, Nondistended, non-tender.  Extremities: RUE 2 plus edema    Data Reviewed: Basic Metabolic Panel:  Recent Labs Lab 12/17/13 0937 12/20/13 2212 12/21/13 0530 12/22/13 0349 12/23/13 0240  NA 138 136* 137 133* 136*  K 3.6* 3.9 3.8 4.1 3.6*  CL 100 97 97 94* 96  CO2  --  22 22 21 24   GLUCOSE 84 102* 72 89 84  BUN 44* 38* 42* 52* 30*  CREATININE 5.00* 4.82* 5.33* 6.24* 4.57*  CALCIUM  --  8.6 8.2* 7.9* 7.8*   Liver Function Tests:  Recent Labs Lab 12/20/13 2212  AST 14  ALT 8  ALKPHOS 81  BILITOT 0.5  PROT 7.2  ALBUMIN 2.4*   No results found for this basename: LIPASE, AMYLASE,  in the last 168 hours No results found for this basename: AMMONIA,  in the last 168 hours CBC:  Recent Labs Lab 12/17/13 0937 12/20/13 2212 12/21/13 0530 12/22/13 0349 12/23/13 0240  WBC  --  14.3* 10.7* 9.5 8.4  NEUTROABS  --  12.7*  --   --   --  HGB 11.9* 9.7* 8.9* 8.9* 8.6*  HCT 35.0* 30.4* 27.0* 27.6* 26.6*  MCV  --  91.6 91.8 90.8 91.1  PLT  --  178 PLATELET CLUMPS NOTED ON SMEAR, COUNT APPEARS DECREASED 190 180   Cardiac Enzymes:  Recent Labs Lab 12/20/13 2212  TROPONINI <0.30   BNP (last 3 results) No results found for this basename: PROBNP,  in the last 8760 hours CBG: No results found for this basename: GLUCAP,  in the last 168 hours  Recent Results (from the past 240 hour(s))  URINE CULTURE     Status: None   Collection Time    12/20/13  8:58 PM      Result Value Range Status   Specimen Description URINE, CATHETERIZED   Final    Special Requests NONE   Final   Culture  Setup Time     Final   Value: 12/20/2013 23:01     [facility]formed at [facility]   Colony Count     Final   Value: 50,000 COLONIES/ML     [facility]formed at [facility]   Culture     Final   Value: GRAM NEGATIVE RODS     [facility]formed at [facility]   Report Status PENDING   Incomplete     Studies: No results found.  Scheduled Meds: . cefTRIAXone (ROCEPHIN)  IV  1 g Intravenous Q24H  . donepezil  10 mg Oral QHS  . ferrous sulfate  325 mg Oral BID WC  . heparin  5,000 Units Subcutaneous Q8H  . Memantine HCl ER  28 mg Oral Daily  . multivitamin  1 tablet Oral Daily  . sevelamer carbonate  800 mg Oral TID WC  . sodium chloride  3 mL Intravenous Q12H  . torsemide  20 mg Oral Daily   Continuous Infusions:   Active Problems:   UTI (lower urinary tract infection)    Time spent: 25 min    [facility] LLC[facility]SEPH,Merrit Waugh  [facility] Pager [phone]. If 7PM-7AM, please contact night-coverage at [email or web address], password [email or web address] 12/23/2013, 2:36 PM  LOS: 3 days

## 2013-12-23 NOTE — Discharge Summary (Signed)
Physician Discharge Summary  [name]:[id number] DOB: 1930-12-21 DOA: 12/20/2013  PCP: Deanna Maudlin, MD  Admit date: 12/20/2013 Discharge date: 12/23/2013  Time spent: 45 minutes  Recommendations for Outpatient Follow-up:  1. Please FU on urine culture report tomorrow 2. Please get outpatient dopplers to r/o DVT in R upper extremity 3. FU for HD MWF  Discharge Diagnoses:  Principal Problem:   Toxic metabolic encephalopathy Active Problems:   CKD (chronic kidney disease) stage V requiring chronic dialysis   Senile dementia   Thrombocytopenia, unspecified   ESRD (end stage renal disease) on dialysis   Swelling of limb   UTI (lower urinary tract infection)   Anemia of chronic disease   Discharge Condition: improved   Diet recommendation: renal  Filed Weights   12/22/13 1644 12/23/13 1045 12/23/13 1329  Weight: 64.1 kg (141 lb 5 oz) 63.9 kg (140 lb 14 oz) 62.5 kg (137 lb 12.6 oz)    History of present illness:  Chief Complaint: Confusion  HPI:  Pt is 80 yo female with ESRD, HD MWF in [place], brought to [facility] ED for further evaluation of progressive confusion. Please note that pt is somewhat poor historian and most of the details provided by her two sons at bedside. They explain that pt has been progressively confused over the past several days, poor oral intake and failure to thrive. They also reports fevers as high as 101 F, sweats, malodorous urine. Pt still make little bit of urine per sons. Pt currently denies chest pain or shortness of breath, no specific abdominal concerns. Per sons' report, no known concerns of urinary urgency or dysuria, frequency, no flank pain noted.  In ED, pt is hemodynamically stable, UA worrisome for UTI,   Hospital Course:  Toxic metabolic encephalopathy  - mentation improving  - likely related to UTI   GNR UTI  -treated with IV Rocephin,  -urine Cx with 50K colonies of GNR  speciation and sensitivity pending -family adamant to take  her home since she was told by the MDs yesterday that she could be discharge then. -i will discharge her on PO Cefpodoxime, urine cultures will need to be followed up on -WBC normal now and afebrile   ESRD  - HD MWF  - renal following, HD yesterday and today   Anemia of chronic kidney disease  -continue Iron, needs erythropoietin defer to renal panel   H/o Dementia  -continue aricept and namenda   R arm swelling  -could be related to previous AVF related problems  -family adamant to take her home and report that this is chronic, needs outpatient dopplers to r/o DVT  Consultations:  renal  Discharge Exam: Filed Vitals:   12/23/13 1500  BP: 100/57  Pulse: 88  Temp: 98.3 F (36.8 C)  Resp: 20    General: alert, awake, oriented to self and partly to time Cardiovascular: S1S2/RRR Respiratory: CTAB  Discharge Instructions      Discharge Orders   Future Appointments Provider Department Dept Phone   01/08/2014 4:00 PM Fransisco Hertz, MD [facility] [phone]   Future Orders Complete By Expires   Discharge instructions  As directed    Comments:     Renal diet   Increase activity slowly  As directed        Medication List         cefpodoxime 200 MG tablet  Commonly known as:  VANTIN  Take 1 tablet (200 mg total) by mouth every other day.  For 5 days     donepezil 10 MG tablet  Commonly known as:  ARICEPT  Take 10 mg by mouth at bedtime.     ferrous sulfate 325 (65 FE) MG tablet  Take 1 tablet (325 mg total) by mouth 2 (two) times daily with a meal.     multivitamin Tabs tablet  Take 1 tablet by mouth daily.     NAMENDA XR 28 MG Cp24  Generic drug:  Memantine HCl ER  Take 28 mg by mouth daily.     sevelamer carbonate 800 MG tablet  Commonly known as:  RENVELA  Take 800 mg by mouth 3 (three) times daily with meals.     torsemide 20 MG tablet  Commonly known as:  DEMADEX  Take 20 mg by mouth daily.       Allergies   Allergen Reactions  . Sulfa Antibiotics Nausea And Vomiting and Rash  . Sulfur Nausea And Vomiting and Rash   Follow-up Information   Follow up with HAWKINS,EDWARD L, MD. Schedule an appointment as soon as possible for a visit in 1 week.   Specialty:  Pulmonary Disease   Contact information:   [address] [place] [phone]        The results of significant diagnostics from this hospitalization (including imaging, microbiology, ancillary and laboratory) are listed below for reference.    Significant Diagnostic Studies: Dg Chest 2 View  12/20/2013   CLINICAL DATA:  Confusion.  End-stage renal disease.  EXAM: CHEST  2 VIEW  COMPARISON:  12/17/2013  FINDINGS: Left IJ dialysis catheter, catheter tips extending into the hepatic cava. Left subclavian vascular stenting.  Chronic cardiomegaly. Small bilateral pleural effusions, left larger than right. Diffuse interstitial coarsening, above baseline. No asymmetric opacity. No pneumothorax.  IMPRESSION: Pulmonary edema and small pleural effusions.   Electronically Signed   By: Tiburcio PeaJonathan  Watts M.D.   On: 12/20/2013 21:21   Ct Head Wo Contrast  12/20/2013   CLINICAL DATA:  Altered mental status.  EXAM: CT HEAD WITHOUT CONTRAST  TECHNIQUE: Contiguous axial images were obtained from the base of the skull through the vertex without intravenous contrast.  COMPARISON:  None.  FINDINGS: There is cortical atrophy and some chronic microvascular ischemic change. No evidence of acute intracranial abnormality including infarction, hemorrhage, mass lesion, mass effect, midline shift or abnormal extra-axial fluid collection. Chronic right maxillary sinus disease is seen. The calvarium is intact. No pneumocephalus or hydrocephalus.  IMPRESSION: No acute finding.   Electronically Signed   By: Drusilla Kannerhomas  Dalessio M.D.   On: 12/20/2013 21:29   Dg Chest Port 1 View  12/17/2013   CLINICAL DATA:  Patient has undergone dialysis catheter  placement.  EXAM: PORTABLE CHEST - 1 VIEW  COMPARISON:  DG CHEST 1V PORT dated 11/09/2013  FINDINGS: The dialysis type catheter has been placed via the low internal jugular approach. The tip of the catheter lies in the region of the distal aspect of the inferior vena cava. There is no pneumothorax. The cardiopericardial silhouette is enlarged. The pulmonary interstitial markings are mildly increased. The left hemidiaphragm is obscured likely secondary to atelectasis.  IMPRESSION: 1. The patient has undergone interval placement of the dual-lumen dialysis type catheter. The position of the tip of the catheter is in the region of the distal aspect of the inferior vena cava. Correlation clinically as to the adequacy of this positioning is needed. 2. The findings are consistent with low-grade CHF which appears stable. New left  lower lobe atelectasis is present.   Electronically Signed   By: David  [place]   On: 12/17/2013 16:19    Microbiology: Recent Results (from the past 240 hour(s))  URINE CULTURE     Status: None   Collection Time    12/20/13  8:58 PM      Result Value Range Status   Specimen Description URINE, CATHETERIZED   Final   Special Requests NONE   Final   Culture  Setup Time     Final   Value: 12/20/2013 23:01     Performed at [facility]   Colony Count     Final   Value: 50,000 COLONIES/ML     Performed at [facility]   Culture     Final   Value: GRAM NEGATIVE RODS     Performed at [facility]   Report Status PENDING   Incomplete     Labs: Basic Metabolic Panel:  Recent Labs Lab 12/17/13 0937 12/20/13 2212 12/21/13 0530 12/22/13 0349 12/23/13 0240  NA 138 136* 137 133* 136*  K 3.6* 3.9 3.8 4.1 3.6*  CL 100 97 97 94* 96  CO2  --  22 22 21 24   GLUCOSE 84 102* 72 89 84  BUN 44* 38* 42* 52* 30*  CREATININE 5.00* 4.82* 5.33* 6.24* 4.57*  CALCIUM  --  8.6 8.2* 7.9* 7.8*   Liver Function Tests:  Recent Labs Lab 12/20/13 2212  AST 14  ALT 8   ALKPHOS 81  BILITOT 0.5  PROT 7.2  ALBUMIN 2.4*   No results found for this basename: LIPASE, AMYLASE,  in the last 168 hours No results found for this basename: AMMONIA,  in the last 168 hours CBC:  Recent Labs Lab 12/17/13 0937 12/20/13 2212 12/21/13 0530 12/22/13 0349 12/23/13 0240  WBC  --  14.3* 10.7* 9.5 8.4  NEUTROABS  --  12.7*  --   --   --   HGB 11.9* 9.7* 8.9* 8.9* 8.6*  HCT 35.0* 30.4* 27.0* 27.6* 26.6*  MCV  --  91.6 91.8 90.8 91.1  PLT  --  178 PLATELET CLUMPS NOTED ON SMEAR, COUNT APPEARS DECREASED 190 180   Cardiac Enzymes:  Recent Labs Lab 12/20/13 2212  TROPONINI <0.30   BNP: BNP (last 3 results) No results found for this basename: PROBNP,  in the last 8760 hours CBG: No results found for this basename: GLUCAP,  in the last 168 hours     Signed:  Takeshi Teasdale  [facility] 12/23/2013, 3:39 PM

## 2013-12-23 NOTE — Progress Notes (Signed)
Discharge instructions reviewed with family and patient; vascular came to pick up pt; called MD and vascular was canceled; pt is to follow up with primary; no further questions at this time.  BARNETT, Geroge BasemanHEATHER M

## 2013-12-24 LAB — URINE CULTURE

## 2013-12-25 NOTE — Addendum Note (Signed)
Addendum created 12/25/13 0010 by Judie Petitharlene Edwards, MD   Modules edited: Anesthesia Attestations

## 2014-01-01 ENCOUNTER — Encounter: Payer: Medicare HMO | Admitting: Vascular Surgery

## 2014-01-07 ENCOUNTER — Encounter: Payer: Self-pay | Admitting: Vascular Surgery

## 2014-01-08 ENCOUNTER — Encounter: Payer: Self-pay | Admitting: Vascular Surgery

## 2014-01-08 ENCOUNTER — Ambulatory Visit (INDEPENDENT_AMBULATORY_CARE_PROVIDER_SITE_OTHER): Payer: Medicare HMO | Admitting: Vascular Surgery

## 2014-01-08 VITALS — BP 130/66 | HR 86 | Ht 61.0 in | Wt 138.0 lb

## 2014-01-08 DIAGNOSIS — T82898A Other specified complication of vascular prosthetic devices, implants and grafts, initial encounter: Secondary | ICD-10-CM

## 2014-01-08 DIAGNOSIS — N186 End stage renal disease: Secondary | ICD-10-CM

## 2014-01-08 NOTE — Progress Notes (Signed)
Brief History and Physical  History of Present Illness  [name] is a 80 y.o. female who presents with chief complaint: Right arm swelling.  The patient presents today for follow up after ligation ofcompeting branch right brachiocephalic AV fistula and Venoplasty right cephalic vein stenosis.  The swelling is no better and her right hand has blue discoloration and coolness to touch of all 5 digits.     Past Medical History  Diagnosis Date  . Renal disorder   . Shortness of breath     with exertion  . Pneumonia 05/2013  . Mental disorder     dementia - short term memory  . History of blood transfusion     after fistula ruptured  . Dementia     Past Surgical History  Procedure Laterality Date  . Dialysis fistula creation    . Ligation of arteriovenous  fistula Left 06/04/2013    Procedure: LIGATION OF ARTERIOVENOUS  FISTULA;  Surgeon: Fransisco HertzBrian L Chen, MD;  [facility];  Service: Vascular;  Laterality: Left;  . Insertion of dialysis catheter Right 06/04/2013    Procedure: INSERTION OF DIALYSIS CATHETER;  Surgeon: Fransisco HertzBrian L Chen, MD;  [facility];  Service: Vascular;  Laterality: Right;  . Cholecystectomy  1992  . Av fistula placement Right 07/07/2013    Procedure: ARTERIOVENOUS (AV) FISTULA CREATION - RIGHT BRACHIAL CEPHALIC ;  Surgeon: Fransisco HertzBrian L Chen, MD;  [facility];  Service: Vascular;  Laterality: Right;  . Removal of a dialysis catheter Right 11/09/2013    Procedure: REMOVAL OF A DIALYSIS CATHETER- Right TDC ;  Surgeon: Fransisco HertzBrian L Chen, MD;  [facility];  Service: Vascular;  Laterality: Right;  . Insertion of dialysis catheter Left 11/09/2013    Procedure: INSERTION OF DIALYSIS CATHETER- Left TDC;  Surgeon: Fransisco HertzBrian L Chen, MD;  [facility];  Service: Vascular;  Laterality: Left;  . Exchange of a dialysis catheter Left 12/17/2013    Procedure: EXCHANGE OF A DIALYSIS CATHETER;  Surgeon: Chuck Hinthristopher S Dickson, MD;  [facility];  Service: Vascular;  Laterality:  Left;  . Fistulogram Right 12/17/2013    Procedure: FISTULOGRAM- RIGHT ARM- POSSIBLE INTERVENTION;  Surgeon: Chuck Hinthristopher S Dickson, MD;  [facility];  Service: Vascular;  Laterality: Right;  . Ligation of competing branches of arteriovenous fistula Right 12/17/2013    Procedure: LIGATION OF COMPETING BRANCHES OF ARTERIOVENOUS FISTULA;  Surgeon: Chuck Hinthristopher S Dickson, MD;  [facility];  Service: Vascular;  Laterality: Right;    History   Social History  . Marital Status: Divorced    Spouse Name: N/A    Number of Children: N/A  . Years of Education: N/A   Occupational History  . Not on file.   Social History Main Topics  . Smoking status: Never Smoker   . Smokeless tobacco: Never Used  . Alcohol Use: No  . Drug Use: No  . Sexual Activity: No   Other Topics Concern  . Not on file   Social History Narrative  . No narrative on file    History reviewed. No pertinent family history.  Current Outpatient Prescriptions on File Prior to Visit  Medication Sig Dispense Refill  . cefpodoxime (VANTIN) 200 MG tablet Take 1 tablet (200 mg total) by mouth every other day. For 5 days  3 tablet  0  . donepezil (ARICEPT) 10 MG tablet Take 10 mg by mouth at bedtime.      . ferrous sulfate 325 (65 FE) MG  tablet Take 1 tablet (325 mg total) by mouth 2 (two) times daily with a meal.  60 tablet  0  . Memantine HCl ER (NAMENDA XR) 28 MG CP24 Take 28 mg by mouth daily.      . multivitamin (RENA-VIT) TABS tablet Take 1 tablet by mouth daily.      . sevelamer carbonate (RENVELA) 800 MG tablet Take 800 mg by mouth 3 (three) times daily with meals.      . torsemide (DEMADEX) 20 MG tablet Take 20 mg by mouth daily.       No current facility-administered medications on file prior to visit.    Allergies  Allergen Reactions  . Sulfa Antibiotics Nausea And Vomiting and Rash  . Sulfur Nausea And Vomiting and Rash    Review of Systems: As listed above, otherwise negative.  Physical  Examination  Filed Vitals:   01/08/14 1559  BP: 130/66  Pulse: 86  Height: 5\' 1"  (1.549 m)  Weight: 138 lb (62.596 kg)  SpO2: 100%    General: A&O x 3, WDWN  Pulmonary: Sym exp, good air movment  Cardiac: RRR   Musculoskeletal: M/S 5/5 throughout, Extremities with ischemic changes to her right finger tips. No ulcers and grip is 5/5   Medical Decision Making  [name] is a 80 y.o. 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     The patient is scheduled for: Fistula Ligation @/24/2015 by Dr. Darrick Penna.  She will use her diatek catheter for dialysis from this point on per her families request.  Mosetta Pigeon PA-C [facility] [place] Office: [phone]   01/08/2014, 4:20 PM  Addendum  I have independently interviewed and examined the patient, and I agree with the physician assistant's findings.  I suspect there is residual venous outflow stenosis given the worsening R arm swelling.  Ligation of the large side branch likely worsened this by removing a possible pressure outlet for the arm.  At this point, the patient's family is upset and would like resolution.  In this significantly impaired and demented patient, I recommended ligation of the right arm fistula and continuing hemodialysis via a catheter.   This is scheduled for this coming Tuesday with Dr. Darrick Penna.  Leonides Sake, MD [facility] [place] Office: [phone] Pager: [phone]  01/08/2014, 4:41 PM

## 2014-01-11 ENCOUNTER — Other Ambulatory Visit: Payer: Self-pay

## 2014-01-11 ENCOUNTER — Encounter (HOSPITAL_COMMUNITY): Payer: Self-pay | Admitting: *Deleted

## 2014-01-11 ENCOUNTER — Encounter (HOSPITAL_COMMUNITY): Payer: Self-pay

## 2014-01-11 NOTE — Progress Notes (Signed)
Domingo MendGrandson, Brent Beville verified allergies, meds and history. He lives with patient and will be coming with her in am. Her son, Onalee HuaDavid will also be coming and he is the power of attorney. Pt has dementia.

## 2014-01-12 ENCOUNTER — Ambulatory Visit (HOSPITAL_COMMUNITY): Admission: RE | Admit: 2014-01-12 | Payer: Medicare HMO | Source: Ambulatory Visit | Admitting: Vascular Surgery

## 2014-01-12 SURGERY — LIGATION OF ARTERIOVENOUS  FISTULA
Anesthesia: Monitor Anesthesia Care | Site: Arm Upper | Laterality: Right

## 2014-01-14 ENCOUNTER — Encounter (HOSPITAL_COMMUNITY): Payer: Self-pay | Admitting: Pharmacy Technician

## 2014-01-18 ENCOUNTER — Encounter (HOSPITAL_COMMUNITY): Payer: Self-pay | Admitting: *Deleted

## 2014-01-18 MED ORDER — DEXTROSE 5 % IV SOLN
1.5000 g | INTRAVENOUS | Status: AC
  Administered 2014-01-19: 1.5 g via INTRAVENOUS
  Filled 2014-01-18: qty 1.5

## 2014-01-19 ENCOUNTER — Ambulatory Visit (HOSPITAL_COMMUNITY): Payer: Medicare HMO

## 2014-01-19 ENCOUNTER — Encounter (HOSPITAL_COMMUNITY): Payer: Medicare HMO | Admitting: Anesthesiology

## 2014-01-19 ENCOUNTER — Ambulatory Visit (HOSPITAL_COMMUNITY)
Admission: RE | Admit: 2014-01-19 | Discharge: 2014-01-19 | Disposition: A | Discharge status: Home / Self Care | Payer: Medicare HMO | Source: Ambulatory Visit | Attending: Vascular Surgery | Admitting: Vascular Surgery

## 2014-01-19 ENCOUNTER — Ambulatory Visit (HOSPITAL_COMMUNITY): Payer: Medicare HMO | Admitting: Anesthesiology

## 2014-01-19 ENCOUNTER — Telehealth: Payer: Self-pay | Admitting: Vascular Surgery

## 2014-01-19 ENCOUNTER — Encounter (HOSPITAL_COMMUNITY): Payer: Self-pay | Admitting: *Deleted

## 2014-01-19 ENCOUNTER — Encounter (HOSPITAL_COMMUNITY)
Admission: RE | Disposition: A | Discharge status: Home / Self Care | Payer: Self-pay | Source: Ambulatory Visit | Attending: Vascular Surgery

## 2014-01-19 DIAGNOSIS — N186 End stage renal disease: Secondary | ICD-10-CM

## 2014-01-19 DIAGNOSIS — M7989 Other specified soft tissue disorders: Secondary | ICD-10-CM | POA: Insufficient documentation

## 2014-01-19 DIAGNOSIS — Z882 Allergy status to sulfonamides status: Secondary | ICD-10-CM | POA: Insufficient documentation

## 2014-01-19 DIAGNOSIS — F0390 Unspecified dementia without behavioral disturbance: Secondary | ICD-10-CM | POA: Insufficient documentation

## 2014-01-19 DIAGNOSIS — Y832 Surgical operation with anastomosis, bypass or graft as the cause of abnormal reaction of the patient, or of later complication, without mention of misadventure at the time of the procedure: Secondary | ICD-10-CM | POA: Insufficient documentation

## 2014-01-19 DIAGNOSIS — T82898A Other specified complication of vascular prosthetic devices, implants and grafts, initial encounter: Secondary | ICD-10-CM

## 2014-01-19 DIAGNOSIS — R209 Unspecified disturbances of skin sensation: Secondary | ICD-10-CM | POA: Insufficient documentation

## 2014-01-19 DIAGNOSIS — Z992 Dependence on renal dialysis: Secondary | ICD-10-CM | POA: Insufficient documentation

## 2014-01-19 HISTORY — PX: LIGATION OF ARTERIOVENOUS  FISTULA: SHX5948

## 2014-01-19 LAB — POCT I-STAT 4, (NA,K, GLUC, HGB,HCT)
Glucose, Bld: 83 mg/dL (ref 70–99)
HCT: 33 % — ABNORMAL LOW (ref 36.0–46.0)
HEMOGLOBIN: 11.2 g/dL — AB (ref 12.0–15.0)
POTASSIUM: 3.4 meq/L — AB (ref 3.7–5.3)
SODIUM: 135 meq/L — AB (ref 137–147)

## 2014-01-19 SURGERY — LIGATION OF ARTERIOVENOUS  FISTULA
Anesthesia: Monitor Anesthesia Care | Site: Arm Upper | Laterality: Right

## 2014-01-19 MED ORDER — SODIUM CHLORIDE 0.9 % IJ SOLN
INTRAMUSCULAR | Status: AC
  Filled 2014-01-19: qty 10

## 2014-01-19 MED ORDER — PROPOFOL 10 MG/ML IV BOLUS
INTRAVENOUS | Status: AC
  Filled 2014-01-19: qty 20

## 2014-01-19 MED ORDER — MIDAZOLAM HCL 2 MG/2ML IJ SOLN
INTRAMUSCULAR | Status: AC
  Filled 2014-01-19: qty 2

## 2014-01-19 MED ORDER — ONDANSETRON HCL 4 MG/2ML IJ SOLN
INTRAMUSCULAR | Status: AC
  Filled 2014-01-19: qty 2

## 2014-01-19 MED ORDER — 0.9 % SODIUM CHLORIDE (POUR BTL) OPTIME
TOPICAL | Status: DC | PRN
  Administered 2014-01-19: 1000 mL

## 2014-01-19 MED ORDER — FENTANYL CITRATE 0.05 MG/ML IJ SOLN
INTRAMUSCULAR | Status: AC
  Filled 2014-01-19: qty 5

## 2014-01-19 MED ORDER — THROMBIN 20000 UNITS EX SOLR
CUTANEOUS | Status: AC
  Filled 2014-01-19: qty 20000

## 2014-01-19 MED ORDER — CHLORHEXIDINE GLUCONATE CLOTH 2 % EX PADS: 6.0000 | MEDICATED_PAD | Freq: Once | CUTANEOUS | Status: DC

## 2014-01-19 MED ORDER — FENTANYL CITRATE 0.05 MG/ML IJ SOLN: 25.0000 ug | INTRAMUSCULAR | Status: DC | PRN

## 2014-01-19 MED ORDER — FENTANYL CITRATE 0.05 MG/ML IJ SOLN
INTRAMUSCULAR | Status: DC | PRN
  Administered 2014-01-19: 50 ug via INTRAVENOUS

## 2014-01-19 MED ORDER — PHENYLEPHRINE HCL 10 MG/ML IJ SOLN
INTRAMUSCULAR | Status: DC | PRN
  Administered 2014-01-19 (×3): 80 ug via INTRAVENOUS

## 2014-01-19 MED ORDER — ONDANSETRON HCL 4 MG/2ML IJ SOLN
INTRAMUSCULAR | Status: DC | PRN
  Administered 2014-01-19: 4 mg via INTRAVENOUS

## 2014-01-19 MED ORDER — SUCCINYLCHOLINE CHLORIDE 20 MG/ML IJ SOLN
INTRAMUSCULAR | Status: AC
  Filled 2014-01-19: qty 1

## 2014-01-19 MED ORDER — PROPOFOL INFUSION 10 MG/ML OPTIME
INTRAVENOUS | Status: DC | PRN
  Administered 2014-01-19: 50 ug/kg/min via INTRAVENOUS

## 2014-01-19 MED ORDER — EPHEDRINE SULFATE 50 MG/ML IJ SOLN
INTRAMUSCULAR | Status: AC
  Filled 2014-01-19: qty 1

## 2014-01-19 MED ORDER — SODIUM CHLORIDE 0.9 % IV SOLN
INTRAVENOUS | Status: DC
Start: 2014-01-19 — End: 2014-01-19

## 2014-01-19 MED ORDER — CEFUROXIME SODIUM 1.5 G IJ SOLR
INTRAMUSCULAR | Status: AC
  Filled 2014-01-19: qty 1.5

## 2014-01-19 MED ORDER — OXYCODONE-ACETAMINOPHEN 5-325 MG PO TABS: 1.0000 | ORAL_TABLET | Freq: Four times a day (QID) | ORAL | Status: DC | PRN

## 2014-01-19 MED ORDER — MIDAZOLAM HCL 5 MG/5ML IJ SOLN
INTRAMUSCULAR | Status: DC | PRN
  Administered 2014-01-19: 1 mg via INTRAVENOUS

## 2014-01-19 MED ORDER — LIDOCAINE-EPINEPHRINE (PF) 1 %-1:200000 IJ SOLN
INTRAMUSCULAR | Status: DC | PRN
  Administered 2014-01-19: 30 mL

## 2014-01-19 MED ORDER — PHENYLEPHRINE 40 MCG/ML (10ML) SYRINGE FOR IV PUSH (FOR BLOOD PRESSURE SUPPORT)
PREFILLED_SYRINGE | INTRAVENOUS | Status: AC
  Filled 2014-01-19: qty 10

## 2014-01-19 MED ORDER — BUPIVACAINE HCL 0.5 % IJ SOLN
INTRAMUSCULAR | Status: DC | PRN
  Administered 2014-01-19: 30 mL

## 2014-01-19 MED ORDER — SODIUM CHLORIDE 0.9 % IV SOLN
INTRAVENOUS | Status: DC | PRN
  Administered 2014-01-19: 07:00:00 via INTRAVENOUS

## 2014-01-19 MED ORDER — SODIUM CHLORIDE 0.9 % IR SOLN
Status: DC | PRN
  Administered 2014-01-19: 08:00:00

## 2014-01-19 SURGICAL SUPPLY — 39 items
BANDAGE ELASTIC 4 VELCRO ST LF (GAUZE/BANDAGES/DRESSINGS) ×3 IMPLANT
BANDAGE GAUZE ELAST BULKY 4 IN (GAUZE/BANDAGES/DRESSINGS) ×3 IMPLANT
CANISTER SUCTION 2500CC (MISCELLANEOUS) ×3 IMPLANT
CLIP TI MEDIUM 6 (CLIP) ×3 IMPLANT
CLIP TI WIDE RED SMALL 6 (CLIP) ×3 IMPLANT
COVER SURGICAL LIGHT HANDLE (MISCELLANEOUS) ×3 IMPLANT
DERMABOND ADHESIVE PROPEN (GAUZE/BANDAGES/DRESSINGS) ×2
DERMABOND ADVANCED (GAUZE/BANDAGES/DRESSINGS) ×2
DERMABOND ADVANCED .7 DNX12 (GAUZE/BANDAGES/DRESSINGS) ×1 IMPLANT
DERMABOND ADVANCED .7 DNX6 (GAUZE/BANDAGES/DRESSINGS) ×1 IMPLANT
ELECT REM PT RETURN 9FT ADLT (ELECTROSURGICAL) ×3
ELECTRODE REM PT RTRN 9FT ADLT (ELECTROSURGICAL) ×1 IMPLANT
GEL ULTRASOUND 20GR AQUASONIC (MISCELLANEOUS) IMPLANT
GLOVE BIO SURGEON STRL SZ7 (GLOVE) ×3 IMPLANT
GLOVE BIOGEL PI IND STRL 7.5 (GLOVE) ×1 IMPLANT
GLOVE BIOGEL PI INDICATOR 7.5 (GLOVE) ×2
GOWN STRL REUS W/ TWL LRG LVL3 (GOWN DISPOSABLE) ×3 IMPLANT
GOWN STRL REUS W/TWL LRG LVL3 (GOWN DISPOSABLE) ×6
KIT BASIN OR (CUSTOM PROCEDURE TRAY) ×3 IMPLANT
KIT ROOM TURNOVER OR (KITS) ×3 IMPLANT
NS IRRIG 1000ML POUR BTL (IV SOLUTION) ×3 IMPLANT
PACK CV ACCESS (CUSTOM PROCEDURE TRAY) ×3 IMPLANT
PAD ARMBOARD 7.5X6 YLW CONV (MISCELLANEOUS) ×6 IMPLANT
SPONGE GAUZE 4X4 12PLY (GAUZE/BANDAGES/DRESSINGS) ×3 IMPLANT
SPONGE SURGIFOAM ABS GEL 100 (HEMOSTASIS) IMPLANT
STAPLER VISISTAT (STAPLE) ×3 IMPLANT
SUT ETHILON 3 0 PS 1 (SUTURE) IMPLANT
SUT MNCRL AB 4-0 PS2 18 (SUTURE) ×6 IMPLANT
SUT PROLENE 6 0 BV (SUTURE) IMPLANT
SUT SILK 0 TIES 10X30 (SUTURE) ×3 IMPLANT
SUT VIC AB 3-0 SH 27 (SUTURE) ×2
SUT VIC AB 3-0 SH 27X BRD (SUTURE) ×1 IMPLANT
SWAB COLLECTION DEVICE MRSA (MISCELLANEOUS) IMPLANT
TAPE CLOTH SURG 4X10 WHT LF (GAUZE/BANDAGES/DRESSINGS) ×3 IMPLANT
TOWEL OR 17X24 6PK STRL BLUE (TOWEL DISPOSABLE) ×3 IMPLANT
TOWEL OR 17X26 10 PK STRL BLUE (TOWEL DISPOSABLE) ×3 IMPLANT
TUBE ANAEROBIC SPECIMEN COL (MISCELLANEOUS) IMPLANT
UNDERPAD 30X30 INCONTINENT (UNDERPADS AND DIAPERS) ×3 IMPLANT
WATER STERILE IRR 1000ML POUR (IV SOLUTION) ×3 IMPLANT

## 2014-01-19 NOTE — Anesthesia Procedure Notes (Signed)
Procedure Name: MAC Date/Time: 01/19/2014 7:44 AM Performed by: Jerilee HohMUMM, Abdurahman Rugg N Pre-anesthesia Checklist: Patient identified, Emergency Drugs available, Suction available and Patient being monitored Patient Re-evaluated:Patient Re-evaluated prior to inductionOxygen Delivery Method: Simple face mask Intubation Type: IV induction Ventilation: Nasal airway inserted- appropriate to patient size Placement Confirmation: positive ETCO2 and breath sounds checked- equal and bilateral

## 2014-01-19 NOTE — Op Note (Signed)
    OPERATIVE NOTE   PROCEDURE: 1. Ligation of right brachiocephalic arteriovenous fistula    PRE-OPERATIVE DIAGNOSIS: swollen right arm, likely venous stenosis  POST-OPERATIVE DIAGNOSIS: same as above   SURGEON: Deanna SakeBrian Diyana Starrett, MD  ANESTHESIA: local and MAC  ESTIMATED BLOOD LOSS: 30 cc  FINDING(S): 1. Large distal fistula (>1 cm) 2. Dopplerable right signal after fistula ligation  SPECIMEN(S):  none  INDICATIONS:   [name] is a 80 y.o. female who presents with severe right arm swelling. Multiple interventions previously were completed to try to optimize her right brachiocephalic arteriovenous fistula.  Unfortunately, these measures resulted in severe right arm swelling.  This patient has had limited use of the right arm since developing severe swelling.  I discussed with the patient and son to ligate her fistula.  They are aware risk include: infection, bleeding, and need for additional procedure in the future if the vein patch becomes aneurysmal.  DESCRIPTION: After obtaining full informed written consent, the patient was brought back to the operating room and placed supine upon the operating table.  The patient received IV antibiotics prior to induction.  After obtaining adequate anesthesia, the patient was prepped and draped in the standard fashion for: right access procedure.  I injected 5 cc of 1% lidocaine with epinephrine in the prior antecubital incision.  I made an incision and dissected out the distal fistula.  I was able to easily dissect out the anastomosis.  I tied off the fistula distally with two sets of 0 silk ties adjacent to each other (4 ties total).  I transected the fistula between the sets of ties.  This left a vein patch on the anastomosis.  Distally there was no palpable pulse due to the severe edema but there was a strong radial signal.  I reapproximated the deep edematous subcutaneous tissue with 3-0 Vicryl.  The subdermal tissue was reapproximated with a 4-0  monocryl.  I stapled the skin as the patient will drain serous fluid from this incision until her edema resolves.  I cleaned off the right arm and then applied sterile dressing to the antecubitum.  I wrapped the arm with a Kerlix and then applied an ACE wrap to the right arm gently.    COMPLICATIONS: none  CONDITION: stable  Deanna SakeBrian Ezmae Speers, MD [facility] [place] Office: [phone] Pager: [phone]  01/19/2014, 8:24 AM

## 2014-01-19 NOTE — Progress Notes (Signed)
Report given to lauren rn as caregiver 

## 2014-01-19 NOTE — Interval H&P Note (Signed)
History and Physical Interval Note:  01/19/2014 7:01 AM  Deanna StarchJean I Orlick  has presented today for surgery, with the diagnosis of End Stage Renal Disease Swelling of right arm complication of arteriovenous fistula  The various methods of treatment have been discussed with the patient and family. After consideration of risks, benefits and other options for treatment, the patient has consented to  Procedure(s): LIGATION OF ARTERIOVENOUS  FISTULA- RIGHT BRACHIOCEPHALIC FISTULA (Right) as a surgical intervention .  The patient's history has been reviewed, patient examined, no change in status, stable for surgery.  I have reviewed the patient's chart and labs.  Questions were answered to the patient's satisfaction.     Galvin Aversa LIANG-YU

## 2014-01-19 NOTE — Transfer of Care (Signed)
Immediate Anesthesia Transfer of Care Note  Patient: [name]  Procedure(s) Performed: Procedure(s): LIGATION OF ARTERIOVENOUS  FISTULA- RIGHT BRACHIOCEPHALIC FISTULA (Right)  Patient Location: PACU  Anesthesia Type:MAC  Level of Consciousness: awake, alert  and patient cooperative  Airway & Oxygen Therapy: Patient Spontanous Breathing  Post-op Assessment: Report given to PACU RN, Post -op Vital signs reviewed and stable and Patient moving all extremities  Post vital signs: Reviewed and stable  Complications: No apparent anesthesia complications

## 2014-01-19 NOTE — H&P (View-Only) (Signed)
Brief History and Physical  History of Present Illness  [name] is a 80 y.o. female who presents with chief complaint: Right arm swelling.  The patient presents today for follow up after ligation ofcompeting branch right brachiocephalic AV fistula and Venoplasty right cephalic vein stenosis.  The swelling is no better and her right hand has blue discoloration and coolness to touch of all 5 digits.     Past Medical History  Diagnosis Date  . Renal disorder   . Shortness of breath     with exertion  . Pneumonia 05/2013  . Mental disorder     dementia - short term memory  . History of blood transfusion     after fistula ruptured  . Dementia     Past Surgical History  Procedure Laterality Date  . Dialysis fistula creation    . Ligation of arteriovenous  fistula Left 06/04/2013    Procedure: LIGATION OF ARTERIOVENOUS  FISTULA;  Surgeon: Fransisco HertzBrian L Chen, MD;  [facility];  Service: Vascular;  Laterality: Left;  . Insertion of dialysis catheter Right 06/04/2013    Procedure: INSERTION OF DIALYSIS CATHETER;  Surgeon: Fransisco HertzBrian L Chen, MD;  [facility];  Service: Vascular;  Laterality: Right;  . Cholecystectomy  1992  . Av fistula placement Right 07/07/2013    Procedure: ARTERIOVENOUS (AV) FISTULA CREATION - RIGHT BRACHIAL CEPHALIC ;  Surgeon: Fransisco HertzBrian L Chen, MD;  [facility];  Service: Vascular;  Laterality: Right;  . Removal of a dialysis catheter Right 11/09/2013    Procedure: REMOVAL OF A DIALYSIS CATHETER- Right TDC ;  Surgeon: Fransisco HertzBrian L Chen, MD;  [facility];  Service: Vascular;  Laterality: Right;  . Insertion of dialysis catheter Left 11/09/2013    Procedure: INSERTION OF DIALYSIS CATHETER- Left TDC;  Surgeon: Fransisco HertzBrian L Chen, MD;  [facility];  Service: Vascular;  Laterality: Left;  . Exchange of a dialysis catheter Left 12/17/2013    Procedure: EXCHANGE OF A DIALYSIS CATHETER;  Surgeon: Chuck Hinthristopher S Dickson, MD;  [facility];  Service: Vascular;  Laterality:  Left;  . Fistulogram Right 12/17/2013    Procedure: FISTULOGRAM- RIGHT ARM- POSSIBLE INTERVENTION;  Surgeon: Chuck Hinthristopher S Dickson, MD;  [facility];  Service: Vascular;  Laterality: Right;  . Ligation of competing branches of arteriovenous fistula Right 12/17/2013    Procedure: LIGATION OF COMPETING BRANCHES OF ARTERIOVENOUS FISTULA;  Surgeon: Chuck Hinthristopher S Dickson, MD;  [facility];  Service: Vascular;  Laterality: Right;    History   Social History  . Marital Status: Divorced    Spouse Name: N/A    Number of Children: N/A  . Years of Education: N/A   Occupational History  . Not on file.   Social History Main Topics  . Smoking status: Never Smoker   . Smokeless tobacco: Never Used  . Alcohol Use: No  . Drug Use: No  . Sexual Activity: No   Other Topics Concern  . Not on file   Social History Narrative  . No narrative on file    History reviewed. No pertinent family history.  Current Outpatient Prescriptions on File Prior to Visit  Medication Sig Dispense Refill  . cefpodoxime (VANTIN) 200 MG tablet Take 1 tablet (200 mg total) by mouth every other day. For 5 days  3 tablet  0  . donepezil (ARICEPT) 10 MG tablet Take 10 mg by mouth at bedtime.      . ferrous sulfate 325 (65 FE) MG  tablet Take 1 tablet (325 mg total) by mouth 2 (two) times daily with a meal.  60 tablet  0  . Memantine HCl ER (NAMENDA XR) 28 MG CP24 Take 28 mg by mouth daily.      . multivitamin (RENA-VIT) TABS tablet Take 1 tablet by mouth daily.      . sevelamer carbonate (RENVELA) 800 MG tablet Take 800 mg by mouth 3 (three) times daily with meals.      . torsemide (DEMADEX) 20 MG tablet Take 20 mg by mouth daily.       No current facility-administered medications on file prior to visit.    Allergies  Allergen Reactions  . Sulfa Antibiotics Nausea And Vomiting and Rash  . Sulfur Nausea And Vomiting and Rash    Review of Systems: As listed above, otherwise negative.  Physical  Examination  Filed Vitals:   01/08/14 1559  BP: 130/66  Pulse: 86  Height: 5\' 1"  (1.549 m)  Weight: 138 lb (62.596 kg)  SpO2: 100%    General: A&O x 3, WDWN  Pulmonary: Sym exp, good air movment  Cardiac: RRR   Musculoskeletal: M/S 5/5 throughout, Extremities with ischemic changes to her right finger tips. No ulcers and grip is 5/5   Medical Decision Making  [name] is a 80 y.o. 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     The patient is scheduled for: Fistula Ligation @/24/2015 by Dr. Darrick Penna.  She will use her diatek catheter for dialysis from this point on per her families request.  Mosetta Pigeon PA-C [facility] [place] Office: [phone]   01/08/2014, 4:20 PM  Addendum  I have independently interviewed and examined the patient, and I agree with the physician assistant's findings.  I suspect there is residual venous outflow stenosis given the worsening R arm swelling.  Ligation of the large side branch likely worsened this by removing a possible pressure outlet for the arm.  At this point, the patient's family is upset and would like resolution.  In this significantly impaired and demented patient, I recommended ligation of the right arm fistula and continuing hemodialysis via a catheter.   This is scheduled for this coming Tuesday with Dr. Darrick Penna.  Leonides Sake, MD [facility] [place] Office: [phone] Pager: [phone]  01/08/2014, 4:41 PM

## 2014-01-19 NOTE — Discharge Instructions (Signed)

## 2014-01-19 NOTE — Anesthesia Preprocedure Evaluation (Addendum)
Anesthesia Evaluation  Patient identified by MRN, date of birth, ID band Patient awake    Reviewed: Allergy & Precautions, H&P , NPO status , Patient's Chart, lab work & pertinent test results  Airway Mallampati: II TM Distance: >3 FB Neck ROM: Full    Dental no notable dental hx. (+) Teeth Intact, Dental Advisory Given   Pulmonary shortness of breath, resolved,  breath sounds clear to auscultation  Pulmonary exam normal       Cardiovascular negative cardio ROS  Rhythm:Regular Rate:Normal     Neuro/Psych PSYCHIATRIC DISORDERS Dementia negative neurological ROS     GI/Hepatic negative GI ROS, Neg liver ROS,   Endo/Other  negative endocrine ROS  Renal/GU ESRF and DialysisRenal disease  negative genitourinary   Musculoskeletal   Abdominal   Peds  Hematology negative hematology ROS (+) anemia ,   Anesthesia Other Findings   Reproductive/Obstetrics negative OB ROS                         Anesthesia Physical Anesthesia Plan  ASA: III  Anesthesia Plan: MAC   Post-op Pain Management:    Induction: Intravenous  Airway Management Planned: Simple Face Mask  Additional Equipment:   Intra-op Plan:   Post-operative Plan:   Informed Consent: I have reviewed the patients History and Physical, chart, labs and discussed the procedure including the risks, benefits and alternatives for the proposed anesthesia with the patient or authorized representative who has indicated his/her understanding and acceptance.   Dental advisory given  Plan Discussed with: CRNA  Anesthesia Plan Comments:         Anesthesia Quick Evaluation

## 2014-01-19 NOTE — Preoperative (Signed)
Beta Blockers   Reason not to administer Beta Blockers:Not Applicable 

## 2014-01-19 NOTE — Telephone Encounter (Addendum)
Message copied by Fredrich BirksMILLIKAN, DANA P on Tue Jan 19, 2014  3:20 PM ------      Message from: Sharee PimpleMCCHESNEY, MARILYN K      Created: Tue Jan 19, 2014  9:07 AM      Regarding: Schedule                   ----- Message -----         From: Fransisco HertzBrian L Chen, MD         Sent: 01/19/2014   8:33 AM           To: [id number] Charge Pool            Luvenia StarchJean I Teem      [id number]      06/04/1931            PROCEDURE:      Ligation of right brachiocephalic arteriovenous fistula              Follow-up: 2 weeks       ------  01/19/14: lm for pt re appt, dpm

## 2014-01-20 NOTE — Anesthesia Postprocedure Evaluation (Signed)
  Anesthesia Post-op Note  Patient: [name]  Procedure(s) Performed: Procedure(s): LIGATION OF ARTERIOVENOUS  FISTULA- RIGHT BRACHIOCEPHALIC FISTULA (Right)  Patient Location: PACU  Anesthesia Type: MAC  Level of Consciousness: awake and alert   Airway and Oxygen Therapy: Patient Spontanous Breathing  Post-op Pain: mild  Post-op Assessment: Post-op Vital signs reviewed, Patient's Cardiovascular Status Stable and Respiratory Function Stable  Post-op Vital Signs: Reviewed  Filed Vitals:   01/19/14 0931  BP: 134/65  Pulse: 62  Temp:   Resp: 15    Complications: No apparent anesthesia complications

## 2014-01-21 ENCOUNTER — Encounter (HOSPITAL_COMMUNITY): Payer: Self-pay | Admitting: Vascular Surgery

## 2014-02-03 ENCOUNTER — Encounter: Payer: Self-pay | Admitting: Vascular Surgery

## 2014-02-04 ENCOUNTER — Encounter: Payer: Medicare HMO | Admitting: Vascular Surgery

## 2014-02-04 ENCOUNTER — Encounter: Payer: Self-pay | Admitting: Family

## 2014-02-04 ENCOUNTER — Ambulatory Visit (INDEPENDENT_AMBULATORY_CARE_PROVIDER_SITE_OTHER): Payer: Medicare HMO | Admitting: Family

## 2014-02-04 VITALS — BP 168/96 | HR 75 | Temp 97.0°F | Resp 16 | Ht 62.5 in | Wt 140.0 lb

## 2014-02-04 DIAGNOSIS — N186 End stage renal disease: Secondary | ICD-10-CM

## 2014-02-04 DIAGNOSIS — Z4802 Encounter for removal of sutures: Secondary | ICD-10-CM

## 2014-02-04 NOTE — Progress Notes (Signed)
    Postoperative Access Visit   History of Present Illness  [name] is a 80 y.o. year old female who presents for postoperative follow-up and staples removal for: ligation of right brachiocephalic AV fistula (Date: 01/19/2014).  The patient's wounds are well healed. The right arm swelling has resolved, per son. She lives next door to her son and someone stays with her around the clock. She has dementia.   For VQI Use Only  PRE-ADM LIVING: Home  AMB STATUS: Ambulatory  Physical Examination Filed Vitals:   02/04/14 1644  BP: 168/96  Pulse: 75  Temp: 97 F (36.1 C)  Resp: 16   Filed Weights   02/04/14 1644  Weight: 140 lb (63.504 kg)   Body mass index is 25.18 kg/(m^2).   Right upper arm : Incision is well healed, skin feels warm, hand grip is 4/5 and equal. No signs of infection, no drainage, right antecubital incision edges well proximated.  Medical Decision Making  [name] is a 80 y.o. year old female who presents s/p  ligation of right brachiocephalic AV fistula (Date: 01/19/2014).  The patient will follow up with Dr. Imogene Burnhen in 2 weeks for evaluation of permanent HD access placement.  Thank you for allowing us to participate in this patient's care.  Charisse MarchSuzanne Roosevelt Bisher, RN, MSN, FNP-C [facility] [place] Office: [phone]  Clinic MD: Darrick PennaFields  02/04/2014, 4:53 PM

## 2014-02-05 ENCOUNTER — Inpatient Hospital Stay (HOSPITAL_COMMUNITY)
Admission: EM | Admit: 2014-02-05 | Discharge: 2014-02-08 | DRG: 177 | Disposition: A | Discharge status: Home Health | Payer: Medicare HMO | Attending: Pulmonary Disease | Admitting: Pulmonary Disease

## 2014-02-05 ENCOUNTER — Emergency Department (HOSPITAL_COMMUNITY): Payer: Medicare HMO

## 2014-02-05 ENCOUNTER — Other Ambulatory Visit: Payer: Self-pay

## 2014-02-05 ENCOUNTER — Encounter: Payer: Medicare HMO | Admitting: Vascular Surgery

## 2014-02-05 ENCOUNTER — Encounter (HOSPITAL_COMMUNITY): Payer: Self-pay | Admitting: Emergency Medicine

## 2014-02-05 DIAGNOSIS — N186 End stage renal disease: Secondary | ICD-10-CM | POA: Diagnosis present

## 2014-02-05 DIAGNOSIS — J9690 Respiratory failure, unspecified, unspecified whether with hypoxia or hypercapnia: Secondary | ICD-10-CM | POA: Diagnosis present

## 2014-02-05 DIAGNOSIS — J9600 Acute respiratory failure, unspecified whether with hypoxia or hypercapnia: Secondary | ICD-10-CM | POA: Diagnosis present

## 2014-02-05 DIAGNOSIS — Z992 Dependence on renal dialysis: Secondary | ICD-10-CM

## 2014-02-05 DIAGNOSIS — F0390 Unspecified dementia without behavioral disturbance: Secondary | ICD-10-CM | POA: Diagnosis present

## 2014-02-05 DIAGNOSIS — I120 Hypertensive chronic kidney disease with stage 5 chronic kidney disease or end stage renal disease: Secondary | ICD-10-CM | POA: Diagnosis present

## 2014-02-05 DIAGNOSIS — J690 Pneumonitis due to inhalation of food and vomit: Principal | ICD-10-CM | POA: Diagnosis present

## 2014-02-05 DIAGNOSIS — E8770 Fluid overload, unspecified: Secondary | ICD-10-CM

## 2014-02-05 DIAGNOSIS — D649 Anemia, unspecified: Secondary | ICD-10-CM | POA: Diagnosis present

## 2014-02-05 LAB — COMPREHENSIVE METABOLIC PANEL
ALK PHOS: 87 U/L (ref 39–117)
ALT: 6 U/L (ref 0–35)
AST: 14 U/L (ref 0–37)
Albumin: 2.6 g/dL — ABNORMAL LOW (ref 3.5–5.2)
BUN: 38 mg/dL — ABNORMAL HIGH (ref 6–23)
CO2: 29 meq/L (ref 19–32)
Calcium: 8.1 mg/dL — ABNORMAL LOW (ref 8.4–10.5)
Chloride: 94 mEq/L — ABNORMAL LOW (ref 96–112)
Creatinine, Ser: 3.78 mg/dL — ABNORMAL HIGH (ref 0.50–1.10)
GFR, EST AFRICAN AMERICAN: 12 mL/min — AB (ref >90)
GFR, EST NON AFRICAN AMERICAN: 10 mL/min — AB (ref >90)
GLUCOSE: 178 mg/dL — AB (ref 70–99)
POTASSIUM: 3 meq/L — AB (ref 3.7–5.3)
Sodium: 135 mEq/L — ABNORMAL LOW (ref 137–147)
TOTAL PROTEIN: 7.8 g/dL (ref 6.0–8.3)
Total Bilirubin: 0.5 mg/dL (ref 0.3–1.2)

## 2014-02-05 LAB — CBC WITH DIFFERENTIAL/PLATELET
Basophils Absolute: 0 10*3/uL (ref 0.0–0.1)
Basophils Relative: 0 % (ref 0–1)
EOS ABS: 0.4 10*3/uL (ref 0.0–0.7)
Eosinophils Relative: 3 % (ref 0–5)
HCT: 36.9 % (ref 36.0–46.0)
Hemoglobin: 11.7 g/dL — ABNORMAL LOW (ref 12.0–15.0)
Lymphocytes Relative: 12 % (ref 12–46)
Lymphs Abs: 1.5 10*3/uL (ref 0.7–4.0)
MCH: 30.2 pg (ref 26.0–34.0)
MCHC: 31.7 g/dL (ref 30.0–36.0)
MCV: 95.1 fL (ref 78.0–100.0)
MONOS PCT: 3 % (ref 3–12)
Monocytes Absolute: 0.3 10*3/uL (ref 0.1–1.0)
Neutro Abs: 10.3 10*3/uL — ABNORMAL HIGH (ref 1.7–7.7)
Neutrophils Relative %: 82 % — ABNORMAL HIGH (ref 43–77)
Platelets: 161 10*3/uL (ref 150–400)
RBC: 3.88 MIL/uL (ref 3.87–5.11)
RDW: 17.5 % — ABNORMAL HIGH (ref 11.5–15.5)
WBC: 12.5 10*3/uL — ABNORMAL HIGH (ref 4.0–10.5)

## 2014-02-05 LAB — BLOOD GAS, ARTERIAL
ACID-BASE EXCESS: 2.2 mmol/L — AB (ref 0.0–2.0)
BICARBONATE: 26.5 meq/L — AB (ref 20.0–24.0)
DELIVERY SYSTEMS: POSITIVE
EXPIRATORY PAP: 7
FIO2: 40 %
Inspiratory PAP: 14
O2 SAT: 96.9 %
PATIENT TEMPERATURE: 37
PO2 ART: 88.9 mmHg (ref 80.0–100.0)
RATE: 8 resp/min
TCO2: 24.3 mmol/L (ref 0–100)
pCO2 arterial: 43.1 mmHg (ref 35.0–45.0)
pH, Arterial: 7.405 (ref 7.350–7.450)

## 2014-02-05 LAB — LACTIC ACID, PLASMA: LACTIC ACID, VENOUS: 2.3 mmol/L — AB (ref 0.5–2.2)

## 2014-02-05 LAB — TROPONIN I
Troponin I: <0.3 ng/mL (ref <0.30)
Troponin I: 0.38 ng/mL (ref <0.30)

## 2014-02-05 LAB — MRSA PCR SCREENING: MRSA BY PCR: NEGATIVE

## 2014-02-05 MED ORDER — PIPERACILLIN-TAZOBACTAM 3.375 G IVPB
3.3750 g | Freq: Once | INTRAVENOUS | Status: AC
  Administered 2014-02-05: 3.375 g via INTRAVENOUS
  Filled 2014-02-05: qty 50

## 2014-02-05 MED ORDER — SEVELAMER CARBONATE 800 MG PO TABS
800.0000 mg | ORAL_TABLET | Freq: Three times a day (TID) | ORAL | Status: DC
  Administered 2014-02-05 – 2014-02-08 (×8): 800 mg via ORAL
  Filled 2014-02-05 (×8): qty 1

## 2014-02-05 MED ORDER — ONDANSETRON HCL 4 MG/2ML IJ SOLN
4.0000 mg | Freq: Once | INTRAMUSCULAR | Status: AC
  Administered 2014-02-05: 4 mg via INTRAVENOUS

## 2014-02-05 MED ORDER — ONDANSETRON HCL 4 MG PO TABS: 4.0000 mg | ORAL_TABLET | Freq: Four times a day (QID) | ORAL | Status: DC | PRN

## 2014-02-05 MED ORDER — PIPERACILLIN-TAZOBACTAM IN DEX 2-0.25 GM/50ML IV SOLN
2.2500 g | Freq: Three times a day (TID) | INTRAVENOUS | Status: DC
  Administered 2014-02-05 – 2014-02-08 (×9): 2.25 g via INTRAVENOUS
  Filled 2014-02-05 (×12): qty 50

## 2014-02-05 MED ORDER — FERROUS SULFATE 325 (65 FE) MG PO TABS
325.0000 mg | ORAL_TABLET | Freq: Two times a day (BID) | ORAL | Status: DC
  Administered 2014-02-05 – 2014-02-08 (×6): 325 mg via ORAL
  Filled 2014-02-05 (×6): qty 1

## 2014-02-05 MED ORDER — RENA-VITE PO TABS
1.0000 | ORAL_TABLET | Freq: Every day | ORAL | Status: DC
  Administered 2014-02-05 – 2014-02-08 (×4): 1 via ORAL
  Filled 2014-02-05 (×10): qty 1

## 2014-02-05 MED ORDER — ONDANSETRON HCL 4 MG/2ML IJ SOLN: 4.0000 mg | Freq: Four times a day (QID) | INTRAMUSCULAR | Status: DC | PRN

## 2014-02-05 MED ORDER — HEPARIN SODIUM (PORCINE) 5000 UNIT/ML IJ SOLN
5000.0000 [IU] | Freq: Three times a day (TID) | INTRAMUSCULAR | Status: DC
  Administered 2014-02-05 – 2014-02-08 (×9): 5000 [IU] via SUBCUTANEOUS
  Filled 2014-02-05 (×8): qty 1

## 2014-02-05 MED ORDER — DONEPEZIL HCL 5 MG PO TABS
10.0000 mg | ORAL_TABLET | Freq: Every day | ORAL | Status: DC
  Administered 2014-02-05 – 2014-02-07 (×3): 10 mg via ORAL
  Filled 2014-02-05: qty 2
  Filled 2014-02-05: qty 1
  Filled 2014-02-05: qty 2
  Filled 2014-02-05 (×2): qty 1
  Filled 2014-02-05: qty 2

## 2014-02-05 MED ORDER — TORSEMIDE 20 MG PO TABS
20.0000 mg | ORAL_TABLET | Freq: Every day | ORAL | Status: DC
  Administered 2014-02-05 – 2014-02-08 (×4): 20 mg via ORAL
  Filled 2014-02-05 (×4): qty 1

## 2014-02-05 MED ORDER — ASPIRIN EC 81 MG PO TBEC
81.0000 mg | DELAYED_RELEASE_TABLET | Freq: Every day | ORAL | Status: DC
  Administered 2014-02-05 – 2014-02-08 (×4): 81 mg via ORAL
  Filled 2014-02-05 (×4): qty 1

## 2014-02-05 MED ORDER — HEPARIN SODIUM (PORCINE) 5000 UNIT/ML IJ SOLN
5000.0000 [IU] | Freq: Three times a day (TID) | INTRAMUSCULAR | Status: DC
  Filled 2014-02-05: qty 1

## 2014-02-05 MED ORDER — ONDANSETRON HCL 4 MG/2ML IJ SOLN
4.0000 mg | Freq: Once | INTRAMUSCULAR | Status: DC
  Filled 2014-02-05: qty 2

## 2014-02-05 MED ORDER — MEMANTINE HCL ER 28 MG PO CP24
28.0000 mg | ORAL_CAPSULE | Freq: Every day | ORAL | Status: DC
  Administered 2014-02-06 – 2014-02-08 (×3): 28 mg via ORAL
  Filled 2014-02-05 (×5): qty 28

## 2014-02-05 NOTE — H&P (Signed)
[facility] History and Physical  [name] ZOX:[id number] DOB: September 27, 1931 DOA: 02/05/2014  Referring physician: ER. PCP: Fredirick Maudlin, MD   Chief Complaint: Acute dyspnea.  HPI: [name] is a 80 y.o. female  This is an 80 year old lady who has underlying moderate dementia who presents with acute dyspnea while she was in dialysis today. She has end-stage renal disease and requires dialysis. When she came to the emergency room, she was in respiratory failure and required BiPAP. However, she is improved rapidly in the emergency room. Her saturation on room air was 72% when she presented to the ER. The patient herself cannot really give me any clear information due to her dementia. The son is at the bedside and he agrees that she looks much better now. The patient denies any chest pain.   Review of Systems:  Unable secondary to her to dementia.   Past Medical History  Diagnosis Date  . Shortness of breath     with exertion  . Pneumonia 05/2013  . Mental disorder     dementia - short term memory  . History of blood transfusion     after fistula ruptured  . Dementia   . Renal disorder     dialysis MWF   Past Surgical History  Procedure Laterality Date  . Dialysis fistula creation    . Ligation of arteriovenous  fistula Left 06/04/2013    Procedure: LIGATION OF ARTERIOVENOUS  FISTULA;  Surgeon: Fransisco Hertz, MD;  Location: [facility] OR;  Service: Vascular;  Laterality: Left;  . Insertion of dialysis catheter Right 06/04/2013    Procedure: INSERTION OF DIALYSIS CATHETER;  Surgeon: Fransisco Hertz, MD;  Location: [facility] OR;  Service: Vascular;  Laterality: Right;  . Cholecystectomy  1992  . Av fistula placement Right 07/07/2013    Procedure: ARTERIOVENOUS (AV) FISTULA CREATION - RIGHT BRACHIAL CEPHALIC ;  Surgeon: Fransisco Hertz, MD;  Location: [facility] OR;  Service: Vascular;  Laterality: Right;  . Removal of a dialysis catheter Right 11/09/2013    Procedure: REMOVAL OF A DIALYSIS  CATHETER- Right TDC ;  Surgeon: Fransisco Hertz, MD;  Location: [facility] OR;  Service: Vascular;  Laterality: Right;  . Insertion of dialysis catheter Left 11/09/2013    Procedure: INSERTION OF DIALYSIS CATHETER- Left TDC;  Surgeon: Fransisco Hertz, MD;  Location: [facility] OR;  Service: Vascular;  Laterality: Left;  . Exchange of a dialysis catheter Left 12/17/2013    Procedure: EXCHANGE OF A DIALYSIS CATHETER;  Surgeon: Chuck Hint, MD;  Location: [facility] OR;  Service: Vascular;  Laterality: Left;  . Fistulogram Right 12/17/2013    Procedure: FISTULOGRAM- RIGHT ARM- POSSIBLE INTERVENTION;  Surgeon: Chuck Hint, MD;  Location: [facility] OR;  Service: Vascular;  Laterality: Right;  . Ligation of competing branches of arteriovenous fistula Right 12/17/2013    Procedure: LIGATION OF COMPETING BRANCHES OF ARTERIOVENOUS FISTULA;  Surgeon: Chuck Hint, MD;  Location: [facility] OR;  Service: Vascular;  Laterality: Right;  . Ligation of arteriovenous  fistula Right 01/19/2014    Procedure: LIGATION OF ARTERIOVENOUS  FISTULA- RIGHT BRACHIOCEPHALIC FISTULA;  Surgeon: Fransisco Hertz, MD;  Location: [facility] OR;  Service: Vascular;  Laterality: Right;   Social History:  reports that she has never smoked. She has never used smokeless tobacco. She reports that she does not drink alcohol or use illicit drugs.  Allergies  Allergen Reactions  . Sulfa Antibiotics Nausea And Vomiting and Rash  . Sulfur Nausea And Vomiting and Rash  History reviewed. No pertinent family history.   Prior to Admission medications   Medication Sig Start Date End Date Taking? Authorizing Provider  donepezil (ARICEPT) 10 MG tablet Take 10 mg by mouth at bedtime.   Yes Historical Provider, MD  ferrous sulfate 325 (65 FE) MG tablet Take 1 tablet (325 mg total) by mouth 2 (two) times daily with a meal. 06/05/13  Yes Renae Fickle, MD  Memantine HCl ER (NAMENDA XR) 28 MG CP24 Take 28 mg by mouth daily.   Yes Historical Provider, MD  multivitamin  (RENA-VIT) TABS tablet Take 1 tablet by mouth daily.   Yes Historical Provider, MD  sevelamer carbonate (RENVELA) 800 MG tablet Take 800 mg by mouth 3 (three) times daily with meals.   Yes Historical Provider, MD  torsemide (DEMADEX) 20 MG tablet Take 20 mg by mouth daily.   Yes Historical Provider, MD   Physical Exam: Filed Vitals:   02/05/14 1500  BP: 123/74  Pulse: 88  Temp:   Resp: 26    BP 123/74  Pulse 88  Temp(Src) 100.1 F (37.8 C) (Rectal)  Resp 26  SpO2 94%  General:  Appears calm and comfortable. On BiPAP. Eyes: PERRL, normal lids, irises & conjunctiva ENT: grossly normal hearing, lips & tongue Neck: no LAD, masses or thyromegaly Cardiovascular: RRR, no m/r/g. No LE edema. Telemetry: SR, no arrhythmias  Respiratory: CTA bilaterally, no w/r/r. Normal respiratory effort. No significant wheezes, crackles or bronchial breathing. Abdomen: soft, ntnd Skin: no rash or induration seen on limited exam Musculoskeletal: grossly normal tone BUE/BLE Psychiatric: Not examined. Neurologic: grossly non-focal.          Labs on Admission:  Basic Metabolic Panel:  Recent Labs Lab 02/05/14 1306  NA 135*  K 3.0*  CL 94*  CO2 29  GLUCOSE 178*  BUN 38*  CREATININE 3.78*  CALCIUM 8.1*   Liver Function Tests:  Recent Labs Lab 02/05/14 1306  AST 14  ALT 6  ALKPHOS 87  BILITOT 0.5  PROT 7.8  ALBUMIN 2.6*     CBC:  Recent Labs Lab 02/05/14 1306  WBC 12.5*  NEUTROABS 10.3*  HGB 11.7*  HCT 36.9  MCV 95.1  PLT 161   Cardiac Enzymes:  Recent Labs Lab 02/05/14 1306  TROPONINI <0.30      Radiological Exams on Admission: Dg Chest Port 1 View  02/05/2014   CLINICAL DATA:  Shortness of breath  EXAM: PORTABLE CHEST - 1 VIEW  COMPARISON:  DG CHEST 2 VIEW dated 01/19/2014; DG CHEST 2 VIEW dated 12/20/2013; DG CHEST 1V PORT dated 12/17/2013  FINDINGS: Grossly unchanged enlarged cardiac silhouette and mediastinal contours with atherosclerotic plaque within the  thoracic aorta. Stable position of support apparatus. The pulmonary vasculature is less distinct on the present examination with cephalization of flow, worse within the right lung. Worsening bibasilar opacities, left greater than right. No change to minimal increase in small bilateral effusions, left greater than right. No definite pneumothorax though note, evaluation degraded secondary to overlying chin. A vascular stent overlies the left axilla. Unchanged bones.  IMPRESSION: Findings most suggestive of worsening asymmetric alveolar pulmonary edema, though note, underlying infection not excluded. Continued attention on follow-up is recommended.   Electronically Signed   By: Simonne Come M.D.   On: 02/05/2014 13:17    EKG: Independently reviewed. Normal sinus rhythm without any acute ST-T wave changes.  Assessment/Plan   1. Acute respiratory failure which seems to have improved on BiPAP. 2. Possible aspiration pneumonia causing #1.  3. Chronic kidney disease requiring dialysis. 4. Dementia, moderate.  Plan: 1. Admit to step down unit. 2. Try to wean off BiPAP and use nasal cannula oxygen. 3. Intravenous antibiotics for the possibility of aspiration pneumonia. 4. Nephrology consultation. 5. Serial cardiac enzymes in case she has had a cardiac event although I doubt this.  Further recommendations will depend on patient's hospital progress.   Code Status: Full code. This was discussed with the patient's son, who is at the bedside and who has health care power of attorney. While she does not want her to suffer, he feels that we should make efforts to improve her condition if she deteriorates, at least initially.  Family Communication: Discuss plan with patient and son at the bedside.   Disposition Plan: Depending on progress.   Time spent: 45 minutes.  Wilson SingerGOSRANI,Jaloni Davoli C [facility] [phone].

## 2014-02-05 NOTE — ED Provider Notes (Addendum)
CSN: [id number]     Arrival date & time 02/05/14  1228 History   First MD Initiated Contact with Patient 02/05/14 1237     Chief Complaint  Patient presents with  . Shortness of Breath     (Consider location/radiation/quality/duration/timing/severity/associated sxs/prior Treatment) HPI Comments: Patient brought to the ER from dialysis. Patient was at dialysis having her dialysis session when she became nauseated and started gagging. She then suddenly became short of breath. Patient's respiratory rate and work of breathing significantly increased and she was noted to have decreased oxygen saturation of 72% on room air. She does not use oxygen at home. Patient was placed on nasal cannula, oxygenation improved. Patient has a history of dementia, cannot give any further information at this time. Level V Caveat due to dementia.  Patient is a 80 y.o. female presenting with shortness of breath.  Shortness of Breath   Past Medical History  Diagnosis Date  . Shortness of breath     with exertion  . Pneumonia 05/2013  . Mental disorder     dementia - short term memory  . History of blood transfusion     after fistula ruptured  . Dementia   . Renal disorder     dialysis MWF   Past Surgical History  Procedure Laterality Date  . Dialysis fistula creation    . Ligation of arteriovenous  fistula Left 06/04/2013    Procedure: LIGATION OF ARTERIOVENOUS  FISTULA;  Surgeon: Fransisco HertzBrian L Chen, MD;  [facility];  Service: Vascular;  Laterality: Left;  . Insertion of dialysis catheter Right 06/04/2013    Procedure: INSERTION OF DIALYSIS CATHETER;  Surgeon: Fransisco HertzBrian L Chen, MD;  [facility];  Service: Vascular;  Laterality: Right;  . Cholecystectomy  1992  . Av fistula placement Right 07/07/2013    Procedure: ARTERIOVENOUS (AV) FISTULA CREATION - RIGHT BRACHIAL CEPHALIC ;  Surgeon: Fransisco HertzBrian L Chen, MD;  [facility];  Service: Vascular;  Laterality: Right;  . Removal of a dialysis catheter Right  11/09/2013    Procedure: REMOVAL OF A DIALYSIS CATHETER- Right TDC ;  Surgeon: Fransisco HertzBrian L Chen, MD;  [facility];  Service: Vascular;  Laterality: Right;  . Insertion of dialysis catheter Left 11/09/2013    Procedure: INSERTION OF DIALYSIS CATHETER- Left TDC;  Surgeon: Fransisco HertzBrian L Chen, MD;  [facility];  Service: Vascular;  Laterality: Left;  . Exchange of a dialysis catheter Left 12/17/2013    Procedure: EXCHANGE OF A DIALYSIS CATHETER;  Surgeon: Chuck Hinthristopher S Dickson, MD;  [facility];  Service: Vascular;  Laterality: Left;  . Fistulogram Right 12/17/2013    Procedure: FISTULOGRAM- RIGHT ARM- POSSIBLE INTERVENTION;  Surgeon: Chuck Hinthristopher S Dickson, MD;  [facility];  Service: Vascular;  Laterality: Right;  . Ligation of competing branches of arteriovenous fistula Right 12/17/2013    Procedure: LIGATION OF COMPETING BRANCHES OF ARTERIOVENOUS FISTULA;  Surgeon: Chuck Hinthristopher S Dickson, MD;  [facility];  Service: Vascular;  Laterality: Right;  . Ligation of arteriovenous  fistula Right 01/19/2014    Procedure: LIGATION OF ARTERIOVENOUS  FISTULA- RIGHT BRACHIOCEPHALIC FISTULA;  Surgeon: Fransisco HertzBrian L Chen, MD;  [facility];  Service: Vascular;  Laterality: Right;   History reviewed. No pertinent family history. History  Substance Use Topics  . Smoking status: Never Smoker   . Smokeless tobacco: Never Used  . Alcohol Use: No   OB History   Grav Para Term Preterm Abortions TAB SAB Ect Mult Living  Review of Systems  Unable to perform ROS: Dementia  Respiratory: Positive for shortness of breath.       Allergies  Sulfa antibiotics and Sulfur  Home Medications   Current Outpatient Rx  Name  Route  Sig  Dispense  Refill  . donepezil (ARICEPT) 10 MG tablet   Oral   Take 10 mg by mouth at bedtime.         . ferrous sulfate 325 (65 FE) MG tablet   Oral   Take 1 tablet (325 mg total) by mouth 2 (two) times daily with a meal.   60 tablet   0   . Memantine HCl  ER (NAMENDA XR) 28 MG CP24   Oral   Take 28 mg by mouth daily.         . multivitamin (RENA-VIT) TABS tablet   Oral   Take 1 tablet by mouth daily.         . sevelamer carbonate (RENVELA) 800 MG tablet   Oral   Take 800 mg by mouth 3 (three) times daily with meals.         . torsemide (DEMADEX) 20 MG tablet   Oral   Take 20 mg by mouth daily.          BP 173/107  Pulse 122  Temp(Src) 100.1 F (37.8 C) (Rectal)  Resp 35  SpO2 93% Physical Exam  Constitutional: She is oriented to person, place, and time. She appears well-developed and well-nourished. She appears distressed.  HENT:  Head: Normocephalic and atraumatic.  Right Ear: Hearing normal.  Left Ear: Hearing normal.  Nose: Nose normal.  Mouth/Throat: Oropharynx is clear and moist and mucous membranes are normal.  Eyes: Conjunctivae and EOM are normal. Pupils are equal, round, and reactive to light.  Neck: Normal range of motion. Neck supple.  Cardiovascular: Regular rhythm, S1 normal and S2 normal.  Exam reveals no gallop and no friction rub.   No murmur heard. Pulmonary/Chest: Accessory muscle usage present. Tachypnea noted. She has rales. She exhibits no tenderness.  Abdominal: Soft. Normal appearance and bowel sounds are normal. There is no hepatosplenomegaly. There is no tenderness. There is no rebound, no guarding, no tenderness at McBurney's point and negative Murphy's sign. No hernia.  Musculoskeletal: Normal range of motion.  Neurological: She is alert and oriented to person, place, and time. She has normal strength. No cranial nerve deficit or sensory deficit. Coordination normal. GCS eye subscore is 4. GCS verbal subscore is 5. GCS motor subscore is 6.  Skin: Skin is warm, dry and intact. No rash noted. No cyanosis.  Psychiatric: She has a normal mood and affect. Her speech is normal and behavior is normal. Thought content normal.    ED Course  Procedures (including critical care time) Labs  Review Labs Reviewed  CBC WITH DIFFERENTIAL - Abnormal; Notable for the following:    WBC 12.5 (*)    Hemoglobin 11.7 (*)    RDW 17.5 (*)    Neutrophils Relative % 82 (*)    Neutro Abs 10.3 (*)    All other components within normal limits  COMPREHENSIVE METABOLIC PANEL - Abnormal; Notable for the following:    Sodium 135 (*)    Potassium 3.0 (*)    Chloride 94 (*)    Glucose, Bld 178 (*)    BUN 38 (*)    Creatinine, Ser 3.78 (*)    Calcium 8.1 (*)    Albumin 2.6 (*)    GFR calc non Af  Amer 10 (*)    GFR calc Af Amer 12 (*)    All other components within normal limits  LACTIC ACID, PLASMA - Abnormal; Notable for the following:    Lactic Acid, Venous 2.3 (*)    All other components within normal limits  CULTURE, BLOOD (ROUTINE X 2)  CULTURE, BLOOD (ROUTINE X 2)  TROPONIN I  BLOOD GAS, ARTERIAL   Imaging Review Dg Chest Port 1 View  02/05/2014   CLINICAL DATA:  Shortness of breath  EXAM: PORTABLE CHEST - 1 VIEW  COMPARISON:  DG CHEST 2 VIEW dated 01/19/2014; DG CHEST 2 VIEW dated 12/20/2013; DG CHEST 1V PORT dated 12/17/2013  FINDINGS: Grossly unchanged enlarged cardiac silhouette and mediastinal contours with atherosclerotic plaque within the thoracic aorta. Stable position of support apparatus. The pulmonary vasculature is less distinct on the present examination with cephalization of flow, worse within the right lung. Worsening bibasilar opacities, left greater than right. No change to minimal increase in small bilateral effusions, left greater than right. No definite pneumothorax though note, evaluation degraded secondary to overlying chin. A vascular stent overlies the left axilla. Unchanged bones.  IMPRESSION: Findings most suggestive of worsening asymmetric alveolar pulmonary edema, though note, underlying infection not excluded. Continued attention on follow-up is recommended.   Electronically Signed   By: Simonne Come M.D.   On: 02/05/2014 13:17     EKG Interpretation None       Date: 02/05/2014  Rate: 114  Rhythm: sinus tachycardia  QRS Axis: left  Intervals: normal  ST/T Wave abnormalities: nonspecific ST/T changes  Conduction Disutrbances:none  Narrative Interpretation:   Old EKG Reviewed: unchanged    MDM   Final diagnoses:  Aspiration pneumonia    Patient presented to the ER in moderate respiratory distress. She had sudden change in her breathing after vomiting at dialysis. Patient had diffuse rales and I am concerned for aspiration. Chest x-ray does not show any obvious pneumonia. Patient had increased respiratory rate, hypoxia. She has significantly improved on BiPAP. Recheck after initiating BiPAP reveals improved rales and decreased respiratory rate. Blood gas will be obtained after 30 minutes. Patient will be hospitalized for further management.  Discussed with Doctor Kristian Covey, her nephrologist. He confirmed that she got 2 hours and 45 minutes of her dialysis session today. She will unlikely need dialysis in the next 1-2 days.  Gilda Crease, MD 02/05/14 1457  Gilda Crease, MD 02/05/14 1505  Gilda Crease, MD 02/05/14 1524

## 2014-02-05 NOTE — ED Notes (Addendum)
Pt sent from Dialysis by EMS for sudden onset of shortness of breath during dialysis. EMS states that the pt's O2 sats were 72 on room air, 92 on 2 L [place]. Pt poor historian, HX of dementia.

## 2014-02-05 NOTE — Progress Notes (Signed)
ANTIBIOTIC CONSULT NOTE - INITIAL  Pharmacy Consult for Zosyn Indication: Aspiration pneumonia  Allergies  Allergen Reactions  . Sulfa Antibiotics Nausea And Vomiting and Rash  . Sulfur Nausea And Vomiting and Rash    Patient Measurements:   Adjusted Body Weight:   Vital Signs: Temp: 100.1 F (37.8 C) (03/20 1320) Temp src: Rectal (03/20 1320) BP: 124/71 mmHg (03/20 1600) Pulse Rate: 92 (03/20 1600) Intake/Output from previous day:   Intake/Output from this shift:    Labs:  Recent Labs  02/05/14 1306  WBC 12.5*  HGB 11.7*  PLT 161  CREATININE 3.78*   The CrCl is unknown because both a height and weight (above a minimum accepted value) are required for this calculation. No results found for this basename: VANCOTROUGH, VANCOPEAK, VANCORANDOM, GENTTROUGH, GENTPEAK, GENTRANDOM, TOBRATROUGH, TOBRAPEAK, TOBRARND, AMIKACINPEAK, AMIKACINTROU, AMIKACIN,  in the last 72 hours   Microbiology: No results found for this or any previous visit (from the past 720 hour(s)).  Medical History: Past Medical History  Diagnosis Date  . Shortness of breath     with exertion  . Pneumonia 05/2013  . Mental disorder     dementia - short term memory  . History of blood transfusion     after fistula ruptured  . Dementia   . Renal disorder     dialysis MWF    Medications:  Scheduled:  . aspirin EC  81 mg Oral Daily  . donepezil  10 mg Oral QHS  . ferrous sulfate  325 mg Oral BID WC  . heparin  5,000 Units Subcutaneous 3 times per day  . Memantine HCl ER  28 mg Oral Daily  . multivitamin  1 tablet Oral Daily  . sevelamer carbonate  800 mg Oral TID WC  . torsemide  20 mg Oral Daily   Assessment: End stage renal disease, dialysis Possible aspiration pneumonia Zosyn 3.375 GM IV given in ED  Goal of Therapy:  Eradicate infection  Plan:  Zosyn 2.25 GM IV every 8 hours Labs per protocol   Raquel JamesPittman, Maebelle Sulton Bennett 02/05/2014,4:46 PM

## 2014-02-05 NOTE — ED Notes (Signed)
Per EMS, pt developed SOB during dialysis today. O2 sat of 75% reported by dialysis clinic. On arrival, pt dyspneic with O2 sat 97% on 4L [place]. Alert and confused, which is her baseline per EMS.

## 2014-02-05 NOTE — Progress Notes (Signed)
Elevated troponin of 0.38 made known to MD via text page.

## 2014-02-05 NOTE — ED Notes (Signed)
Bipap removed, pt placed on 2L  per Hospitalist.

## 2014-02-06 LAB — TROPONIN I: Troponin I: 0.44 ng/mL (ref <0.30)

## 2014-02-06 LAB — COMPREHENSIVE METABOLIC PANEL
ALK PHOS: 66 U/L (ref 39–117)
ALT: 5 U/L (ref 0–35)
AST: 14 U/L (ref 0–37)
Albumin: 2.3 g/dL — ABNORMAL LOW (ref 3.5–5.2)
BILIRUBIN TOTAL: 0.3 mg/dL (ref 0.3–1.2)
BUN: 52 mg/dL — ABNORMAL HIGH (ref 6–23)
CHLORIDE: 96 meq/L (ref 96–112)
CO2: 28 mEq/L (ref 19–32)
Calcium: 8.4 mg/dL (ref 8.4–10.5)
Creatinine, Ser: 5.08 mg/dL — ABNORMAL HIGH (ref 0.50–1.10)
GFR calc non Af Amer: 7 mL/min — ABNORMAL LOW (ref >90)
GFR, EST AFRICAN AMERICAN: 8 mL/min — AB (ref >90)
GLUCOSE: 83 mg/dL (ref 70–99)
POTASSIUM: 3.5 meq/L — AB (ref 3.7–5.3)
Sodium: 136 mEq/L — ABNORMAL LOW (ref 137–147)
Total Protein: 6.4 g/dL (ref 6.0–8.3)

## 2014-02-06 LAB — CBC
HCT: 29.9 % — ABNORMAL LOW (ref 36.0–46.0)
Hemoglobin: 9.8 g/dL — ABNORMAL LOW (ref 12.0–15.0)
MCH: 30.8 pg (ref 26.0–34.0)
MCHC: 32.8 g/dL (ref 30.0–36.0)
MCV: 94 fL (ref 78.0–100.0)
PLATELETS: 120 10*3/uL — AB (ref 150–400)
RBC: 3.18 MIL/uL — ABNORMAL LOW (ref 3.87–5.11)
RDW: 17.3 % — ABNORMAL HIGH (ref 11.5–15.5)
WBC: 5.2 10*3/uL (ref 4.0–10.5)

## 2014-02-06 LAB — PRO B NATRIURETIC PEPTIDE: Pro B Natriuretic peptide (BNP): >70000 pg/mL — ABNORMAL HIGH (ref 0–450)

## 2014-02-06 LAB — HEPATITIS B SURFACE ANTIGEN: HEP B S AG: NEGATIVE

## 2014-02-06 MED ORDER — LIDOCAINE HCL (PF) 1 % IJ SOLN: 5.0000 mL | INTRAMUSCULAR | Status: DC | PRN

## 2014-02-06 MED ORDER — SODIUM CHLORIDE 0.9 % IV SOLN: 100.0000 mL | INTRAVENOUS | Status: DC | PRN

## 2014-02-06 MED ORDER — HEPARIN SODIUM (PORCINE) 1000 UNIT/ML DIALYSIS
1000.0000 [IU] | INTRAMUSCULAR | Status: DC | PRN
  Filled 2014-02-06: qty 1

## 2014-02-06 MED ORDER — HEPARIN SODIUM (PORCINE) 1000 UNIT/ML DIALYSIS
300.0000 [IU] | INTRAMUSCULAR | Status: DC | PRN
  Filled 2014-02-06: qty 1

## 2014-02-06 MED ORDER — HEPARIN SODIUM (PORCINE) 1000 UNIT/ML DIALYSIS
20.0000 [IU]/kg | INTRAMUSCULAR | Status: DC | PRN
  Administered 2014-02-06: 1300 [IU] via INTRAVENOUS_CENTRAL
  Filled 2014-02-06: qty 2

## 2014-02-06 MED ORDER — ALTEPLASE 2 MG IJ SOLR: 2.0000 mg | Freq: Once | INTRAMUSCULAR | Status: AC | PRN

## 2014-02-06 MED ORDER — PENTAFLUOROPROP-TETRAFLUOROETH EX AERO
1.0000 "application " | INHALATION_SPRAY | CUTANEOUS | Status: DC | PRN
  Filled 2014-02-06: qty 103.5

## 2014-02-06 MED ORDER — NEPRO/CARBSTEADY PO LIQD
237.0000 mL | ORAL | Status: DC | PRN
  Filled 2014-02-06: qty 237

## 2014-02-06 MED ORDER — PIPERACILLIN-TAZOBACTAM IN DEX 2-0.25 GM/50ML IV SOLN
INTRAVENOUS | Status: AC
  Filled 2014-02-06: qty 100

## 2014-02-06 MED ORDER — LIDOCAINE-PRILOCAINE 2.5-2.5 % EX CREA
1.0000 "application " | TOPICAL_CREAM | CUTANEOUS | Status: DC | PRN
  Filled 2014-02-06: qty 5

## 2014-02-06 NOTE — Procedures (Signed)
   HEMODIALYSIS TREATMENT NOTE:  3.5 hour low heparin dialysis completed via left chest tunneled catheter.  Exit site unremarkable.  Goal met:  Tolerated removal of 2.9 liters without interruption in ultrafiltration.  All blood was reinfused.  Report given to Sharene Skeans, RN.  Davey Limas L. Akshay Spang, RN, CDN

## 2014-02-06 NOTE — Progress Notes (Signed)
Troponin of 0.44 notified MD via text page

## 2014-02-06 NOTE — Progress Notes (Signed)
Subjective: Patient was admitted last night due to shortness of breath. Patient is a case of ESRD on hemodialysis. She was seen by her nephrologist and patient is planned for hemodialysis.  Objective: Vital signs in last 24 hours: Temp:  [97.6 F (36.4 C)-100.1 F (37.8 C)] 97.6 F (36.4 C) (03/21 1045) Pulse Rate:  [60-122] 65 (03/21 1130) Resp:  [0-35] 17 (03/21 1130) BP: (107-173)/(60-117) 108/63 mmHg (03/21 1130) SpO2:  [89 %-100 %] 100 % (03/21 1045) Weight:  [62.7 kg (138 lb 3.7 oz)-63.8 kg (140 lb 10.5 oz)] 63.8 kg (140 lb 10.5 oz) (03/21 1045) Weight change:  Last BM Date: 02/05/14  Intake/Output from previous day: 03/20 0701 - 03/21 0700 In: 50 [IV Piggyback:50] Out: -   PHYSICAL EXAM General appearance: alert and no distress Resp: diminished breath sounds bilaterally and rhonchi bilaterally Cardio: S1, S2 normal GI: soft, non-tender; bowel sounds normal; no masses,  no organomegaly Extremities: extremities normal, atraumatic, no cyanosis or edema  Lab Results:    @labtest @ [facility]  Recent Labs  02/05/14 1540  PHART 7.405  PO2ART 88.9  TCO2 24.3  HCO3 26.5*   CULTURES Recent Results (from the past 240 hour(s))  CULTURE, BLOOD (ROUTINE X 2)     Status: None   Collection Time    02/05/14  1:07 PM      Result Value Ref Range Status   Specimen Description BLOOD LEFT HAND   Final   Special Requests BOTTLES DRAWN AEROBIC AND ANAEROBIC 10CC   Final   Culture NO GROWTH 1 DAY   Final   Report Status PENDING   Incomplete  CULTURE, BLOOD (ROUTINE X 2)     Status: None   Collection Time    02/05/14  1:13 PM      Result Value Ref Range Status   Specimen Description BLOOD LEFT ARM   Final   Special Requests BOTTLES DRAWN AEROBIC AND ANAEROBIC 6CC   Final   Culture NO GROWTH 1 DAY   Final   Report Status PENDING   Incomplete  MRSA PCR SCREENING     Status: None   Collection Time    02/05/14  5:38 PM      Result Value Ref Range Status   MRSA by PCR NEGATIVE   NEGATIVE Final   Comment:            The GeneXpert MRSA Assay (FDA     approved for NASAL specimens     only), is one component of a     comprehensive MRSA colonization     surveillance program. It is not     intended to diagnose MRSA     infection nor to guide or     monitor treatment for     MRSA infections.   Studies/Results: Dg Chest Port 1 View  02/05/2014   CLINICAL DATA:  Shortness of breath  EXAM: PORTABLE CHEST - 1 VIEW  COMPARISON:  DG CHEST 2 VIEW dated 01/19/2014; DG CHEST 2 VIEW dated 12/20/2013; DG CHEST 1V PORT dated 12/17/2013  FINDINGS: Grossly unchanged enlarged cardiac silhouette and mediastinal contours with atherosclerotic plaque within the thoracic aorta. Stable position of support apparatus. The pulmonary vasculature is less distinct on the present examination with cephalization of flow, worse within the right lung. Worsening bibasilar opacities, left greater than right. No change to minimal increase in small bilateral effusions, left greater than right. No definite pneumothorax though note, evaluation degraded secondary to overlying chin. A vascular stent overlies the left axilla.  Unchanged bones.  IMPRESSION: Findings most suggestive of worsening asymmetric alveolar pulmonary edema, though note, underlying infection not excluded. Continued attention on follow-up is recommended.   Electronically Signed   By: Simonne ComeJohn  Watts M.D.   On: 02/05/2014 13:17    Medications: I have reviewed the patient's current medications.  Assesment: Active Problems:   CKD (chronic kidney disease) stage V requiring chronic dialysis   Aspiration pneumonia   Respiratory failure   Dementia    Plan: Medications reviewed Nephrology consult appreciated Will continue telemetry     LOS: 1 day   Kealey Kemmer 02/06/2014, 11:35 AM

## 2014-02-06 NOTE — Consult Note (Signed)
Reason for Consult: End-stage renal disease Referring Physician: Dr. Gabriel Rung [name] is an 80 y.o. female.  HPI: She is a patient who has history of end-stage renal disease on maintenance hemodialysis, hypertension dementia presently was brought because of acute shortness of breath, wheezing and tachypnea. Initially Deanna saw patient on dialysis and was her usual state of health. About 2 and half hour on dialysis patient to start some nausea but no vomiting. Soon after that patient to start having difficulty breathing and became tachypneic. On examination patient was wheezing bilaterally. O2 saturation at room air was 87%. Patient was started on 4 L of nasal oxygen and her saturation improved. Possibility of chemical aspiration from her gastric content was entertained and patient referred to the emergency room. Presently patient is stable and offers no complaints. During that episode patient denies any chest pain except difficulty breathing and not feeling good. Past Medical History  Diagnosis Date  . Shortness of breath     with exertion  . Pneumonia 05/2013  . Mental disorder     dementia - short term memory  . History of blood transfusion     after fistula ruptured  . Dementia   . Renal disorder     dialysis MWF    Past Surgical History  Procedure Laterality Date  . Dialysis fistula creation    . Ligation of arteriovenous  fistula Left 06/04/2013    Procedure: LIGATION OF ARTERIOVENOUS  FISTULA;  Surgeon: Conrad [facility], MD;  Location: [place];  Service: Vascular;  Laterality: Left;  . Insertion of dialysis catheter Right 06/04/2013    Procedure: INSERTION OF DIALYSIS CATHETER;  Surgeon: Conrad [place], MD;  Location: [facility];  Service: Vascular;  Laterality: Right;  . Cholecystectomy  1992  . Av fistula placement Right 07/07/2013    Procedure: ARTERIOVENOUS (AV) FISTULA CREATION - RIGHT BRACHIAL CEPHALIC ;  Surgeon: Conrad Hico, MD;  Location: [place];  Service: Vascular;  Laterality: Right;   . Removal of a dialysis catheter Right 11/09/2013    Procedure: REMOVAL OF A DIALYSIS CATHETER- Right TDC ;  Surgeon: Conrad Brimfield, MD;  Location: [place];  Service: Vascular;  Laterality: Right;  . Insertion of dialysis catheter Left 11/09/2013    Procedure: INSERTION OF DIALYSIS CATHETER- Left TDC;  Surgeon: Conrad Alden, MD;  Location: [facility] [place];  Service: Vascular;  Laterality: Left;  . Exchange of a dialysis catheter Left 12/17/2013    Procedure: EXCHANGE OF A DIALYSIS CATHETER;  Surgeon: Angelia Mould, MD;  Location: [place];  Service: Vascular;  Laterality: Left;  . Fistulogram Right 12/17/2013    Procedure: FISTULOGRAM- RIGHT ARM- POSSIBLE INTERVENTION;  Surgeon: Angelia Mould, MD;  Location: [place] [facility];  Service: Vascular;  Laterality: Right;  . Ligation of competing branches of arteriovenous fistula Right 12/17/2013    Procedure: LIGATION OF COMPETING BRANCHES OF ARTERIOVENOUS FISTULA;  Surgeon: Angelia Mould, MD;  Location: [facility];  Service: Vascular;  Laterality: Right;  . Ligation of arteriovenous  fistula Right 01/19/2014    Procedure: LIGATION OF ARTERIOVENOUS  FISTULA- RIGHT BRACHIOCEPHALIC FISTULA;  Surgeon: Conrad Mason, MD;  Location: [place];  Service: Vascular;  Laterality: Right;    History reviewed. No pertinent family history.  Social History:  reports that she has never smoked. She has never used smokeless tobacco. She reports that she does not drink alcohol or use illicit drugs.  Allergies:  Allergies  Allergen Reactions  . Sulfa Antibiotics Nausea  And Vomiting and Rash  . Sulfur Nausea And Vomiting and Rash    Medications: Deanna have reviewed the patient's current medications.  Results for orders placed during the hospital encounter of 02/05/14 (from the past 48 hour(s))  CBC WITH DIFFERENTIAL     Status: Abnormal   Collection Time    02/05/14  1:06 PM      Result Value Ref Range   WBC 12.5 (*) 4.0 - 10.5 K/uL   RBC 3.88  3.87 - 5.11 MIL/uL   Hemoglobin  11.7 (*) 12.0 - 15.0 g/dL   HCT 36.9  36.0 - 46.0 %   MCV 95.1  78.0 - 100.0 fL   MCH 30.2  26.0 - 34.0 pg   MCHC 31.7  30.0 - 36.0 g/dL   RDW 17.5 (*) 11.5 - 15.5 %   Platelets 161  150 - 400 K/uL   Neutrophils Relative % 82 (*) 43 - 77 %   Neutro Abs 10.3 (*) 1.7 - 7.7 K/uL   Lymphocytes Relative 12  12 - 46 %   Lymphs Abs 1.5  0.7 - 4.0 K/uL   Monocytes Relative 3  3 - 12 %   Monocytes Absolute 0.3  0.1 - 1.0 K/uL   Eosinophils Relative 3  0 - 5 %   Eosinophils Absolute 0.4  0.0 - 0.7 K/uL   Basophils Relative 0  0 - 1 %   Basophils Absolute 0.0  0.0 - 0.1 K/uL  COMPREHENSIVE METABOLIC PANEL     Status: Abnormal   Collection Time    02/05/14  1:06 PM      Result Value Ref Range   Sodium 135 (*) 137 - 147 mEq/L   Potassium 3.0 (*) 3.7 - 5.3 mEq/L   Chloride 94 (*) 96 - 112 mEq/L   CO2 29  19 - 32 mEq/L   Glucose, Bld 178 (*) 70 - 99 mg/dL   BUN 38 (*) 6 - 23 mg/dL   Creatinine, Ser 3.78 (*) 0.50 - 1.10 mg/dL   Calcium 8.1 (*) 8.4 - 10.5 mg/dL   Total Protein 7.8  6.0 - 8.3 g/dL   Albumin 2.6 (*) 3.5 - 5.2 g/dL   AST 14  0 - 37 U/L   ALT 6  0 - 35 U/L   Alkaline Phosphatase 87  39 - 117 U/L   Total Bilirubin 0.5  0.3 - 1.2 mg/dL   GFR calc non Af Amer 10 (*) >90 mL/min   GFR calc Af Amer 12 (*) >90 mL/min   Comment: (NOTE)     The eGFR has been calculated using the CKD EPI equation.     This calculation has not been validated in all clinical situations.     eGFR's persistently <90 mL/min signify possible Chronic Kidney     Disease.  TROPONIN Deanna     Status: None   Collection Time    02/05/14  1:06 PM      Result Value Ref Range   Troponin Deanna <0.30  <0.30 ng/mL   Comment:            Due to the release kinetics of cTnI,     a negative result within the first hours     of the onset of symptoms does not rule out     myocardial infarction with certainty.     If myocardial infarction is still suspected,     repeat the test at appropriate intervals.  CULTURE, BLOOD  (ROUTINE X 2)  Status: None   Collection Time    02/05/14  1:07 PM      Result Value Ref Range   Specimen Description BLOOD LEFT HAND     Special Requests BOTTLES DRAWN AEROBIC AND ANAEROBIC 10CC     Culture NO GROWTH 1 DAY     Report Status PENDING    LACTIC ACID, PLASMA     Status: Abnormal   Collection Time    02/05/14  1:07 PM      Result Value Ref Range   Lactic Acid, Venous 2.3 (*) 0.5 - 2.2 mmol/L  CULTURE, BLOOD (ROUTINE X 2)     Status: None   Collection Time    02/05/14  1:13 PM      Result Value Ref Range   Specimen Description BLOOD LEFT ARM     Special Requests BOTTLES DRAWN AEROBIC AND ANAEROBIC 6CC     Culture NO GROWTH 1 DAY     Report Status PENDING    BLOOD GAS, ARTERIAL     Status: Abnormal   Collection Time    02/05/14  3:40 PM      Result Value Ref Range   FIO2 40.00     Delivery systems BILEVEL POSITIVE AIRWAY PRESSURE     Rate 8     Inspiratory PAP 14     Expiratory PAP 7     pH, Arterial 7.405  7.350 - 7.450   pCO2 arterial 43.1  35.0 - 45.0 mmHg   pO2, Arterial 88.9  80.0 - 100.0 mmHg   Bicarbonate 26.5 (*) 20.0 - 24.0 mEq/L   TCO2 24.3  0 - 100 mmol/L   Acid-Base Excess 2.2 (*) 0.0 - 2.0 mmol/L   O2 Saturation 96.9     Patient temperature 37.0     Collection site LEFT RADIAL     Drawn by COLLECTED BY RT     Sample type ARTERIAL     Allens test (pass/fail) PASS  PASS  TROPONIN Deanna     Status: None   Collection Time    02/05/14  4:52 PM      Result Value Ref Range   Troponin Deanna <0.30  <0.30 ng/mL   Comment:            Due to the release kinetics of cTnI,     a negative result within the first hours     of the onset of symptoms does not rule out     myocardial infarction with certainty.     If myocardial infarction is still suspected,     repeat the test at appropriate intervals.  MRSA PCR SCREENING     Status: None   Collection Time    02/05/14  5:38 PM      Result Value Ref Range   MRSA by PCR NEGATIVE  NEGATIVE   Comment:             The GeneXpert MRSA Assay (FDA     approved for NASAL specimens     only), is one component of a     comprehensive MRSA colonization     surveillance program. It is not     intended to diagnose MRSA     infection nor to guide or     monitor treatment for     MRSA infections.  TROPONIN Deanna     Status: Abnormal   Collection Time    02/05/14 10:35 PM      Result Value Ref Range  Troponin Deanna 0.38 (*) <0.30 ng/mL   Comment:            Due to the release kinetics of cTnI,     a negative result within the first hours     of the onset of symptoms does not rule out     myocardial infarction with certainty.     If myocardial infarction is still suspected,     repeat the test at appropriate intervals.     CRITICAL RESULT CALLED TO, READ BACK BY AND VERIFIED WITH:     R.WAGONER AT 2317 ON 02/05/14 BY S.VANHOORNE  TROPONIN Deanna     Status: Abnormal   Collection Time    02/06/14  4:36 AM      Result Value Ref Range   Troponin Deanna 0.44 (*) <0.30 ng/mL   Comment: CRITICAL RESULT CALLED TO, READ BACK BY AND VERIFIED WITH:     WAGONER R. AT [id number] ON [id number] BY THOMPSON S.                Due to the release kinetics of cTnI,     a negative result within the first hours     of the onset of symptoms does not rule out     myocardial infarction with certainty.     If myocardial infarction is still suspected,     repeat the test at appropriate intervals.  COMPREHENSIVE METABOLIC PANEL     Status: Abnormal   Collection Time    02/06/14  4:37 AM      Result Value Ref Range   Sodium 136 (*) 137 - 147 mEq/L   Potassium 3.5 (*) 3.7 - 5.3 mEq/L   Chloride 96  96 - 112 mEq/L   CO2 28  19 - 32 mEq/L   Glucose, Bld 83  70 - 99 mg/dL   BUN 52 (*) 6 - 23 mg/dL   Creatinine, Ser 5.08 (*) 0.50 - 1.10 mg/dL   Calcium 8.4  8.4 - 10.5 mg/dL   Total Protein 6.4  6.0 - 8.3 g/dL   Albumin 2.3 (*) 3.5 - 5.2 g/dL   AST 14  0 - 37 U/L   ALT 5  0 - 35 U/L   Alkaline Phosphatase 66  39 - 117 U/L   Total Bilirubin 0.3   0.3 - 1.2 mg/dL   GFR calc non Af Amer 7 (*) >90 mL/min   GFR calc Af Amer 8 (*) >90 mL/min   Comment: (NOTE)     The eGFR has been calculated using the CKD EPI equation.     This calculation has not been validated in all clinical situations.     eGFR's persistently <90 mL/min signify possible Chronic Kidney     Disease.  CBC     Status: Abnormal   Collection Time    02/06/14  4:37 AM      Result Value Ref Range   WBC 5.2  4.0 - 10.5 K/uL   RBC 3.18 (*) 3.87 - 5.11 MIL/uL   Hemoglobin 9.8 (*) 12.0 - 15.0 g/dL   HCT 29.9 (*) 36.0 - 46.0 %   MCV 94.0  78.0 - 100.0 fL   MCH 30.8  26.0 - 34.0 pg   MCHC 32.8  30.0 - 36.0 g/dL   RDW 17.3 (*) 11.5 - 15.5 %   Platelets 120 (*) 150 - 400 K/uL   Comment: DELTA CHECK NOTED    Dg Chest Port 1 View  02/05/2014  CLINICAL DATA:  Shortness of breath  EXAM: PORTABLE CHEST - 1 VIEW  COMPARISON:  DG CHEST 2 VIEW dated 01/19/2014; DG CHEST 2 VIEW dated 12/20/2013; DG CHEST 1V PORT dated 12/17/2013  FINDINGS: Grossly unchanged enlarged cardiac silhouette and mediastinal contours with atherosclerotic plaque within the thoracic aorta. Stable position of support apparatus. The pulmonary vasculature is less distinct on the present examination with cephalization of flow, worse within the right lung. Worsening bibasilar opacities, left greater than right. No change to minimal increase in small bilateral effusions, left greater than right. No definite pneumothorax though note, evaluation degraded secondary to overlying chin. A vascular stent overlies the left axilla. Unchanged bones.  IMPRESSION: Findings most suggestive of worsening asymmetric alveolar pulmonary edema, though note, underlying infection not excluded. Continued attention on follow-up is recommended.   Electronically Signed   By: Sandi Mariscal M.D.   On: 02/05/2014 13:17    Review of Systems  Constitutional: Negative for fever and chills.  Respiratory: Positive for shortness of breath and wheezing.    Cardiovascular: Negative for leg swelling.   Blood pressure 121/67, pulse 60, temperature 98.3 F (36.8 C), temperature source Axillary, resp. rate 0, height _0  (1.6 m), weight 63.8 kg (140 lb 10.5 oz), SpO2 100.00%. Physical Exam  Constitutional: No distress.  Eyes: Left eye exhibits no discharge.  Neck: No JVD present.  Cardiovascular: Normal rate and regular rhythm.   No murmur heard. Respiratory: No respiratory distress. She has no wheezes.  GI: She exhibits no distension. There is no tenderness.  Musculoskeletal: She exhibits no edema.    Assessment/Plan: Problem #1 difficulty in breathing possibly from aspiration. Presently patient feels better. Patient also didn't complete her dialysis in possibly she might have a component of CHF and pleural effusion. Problem #2 end-stage renal disease Problem #3 dementia Problem #4 history of urine tract infection and pneumonia Problem #5 anemia: Secondary to end-stage Reynolds is Problem #6 metabolic bone disease her calcium is range. Plan: We'll make arrangements for patient to get dialysis today Possibly we'll remove about 2-21/2  liters if her blood pressure tolerates. We'll check her basic metabolic panel, phosphorus in the morning. We'll give her Epogen after dialysis.  [name] S 02/06/2014, 7:37 AM

## 2014-02-07 LAB — BASIC METABOLIC PANEL
BUN: 34 mg/dL — ABNORMAL HIGH (ref 6–23)
CALCIUM: 8.2 mg/dL — AB (ref 8.4–10.5)
CO2: 28 meq/L (ref 19–32)
Chloride: 101 mEq/L (ref 96–112)
Creatinine, Ser: 4.12 mg/dL — ABNORMAL HIGH (ref 0.50–1.10)
GFR calc Af Amer: 11 mL/min — ABNORMAL LOW (ref >90)
GFR, EST NON AFRICAN AMERICAN: 9 mL/min — AB (ref >90)
Glucose, Bld: 84 mg/dL (ref 70–99)
Potassium: 3.7 mEq/L (ref 3.7–5.3)
Sodium: 141 mEq/L (ref 137–147)

## 2014-02-07 LAB — CBC
HCT: 29.9 % — ABNORMAL LOW (ref 36.0–46.0)
Hemoglobin: 9.7 g/dL — ABNORMAL LOW (ref 12.0–15.0)
MCH: 30.5 pg (ref 26.0–34.0)
MCHC: 32.4 g/dL (ref 30.0–36.0)
MCV: 94 fL (ref 78.0–100.0)
Platelets: 150 10*3/uL (ref 150–400)
RBC: 3.18 MIL/uL — ABNORMAL LOW (ref 3.87–5.11)
RDW: 17.4 % — ABNORMAL HIGH (ref 11.5–15.5)
WBC: 4.6 10*3/uL (ref 4.0–10.5)

## 2014-02-07 LAB — PHOSPHORUS: Phosphorus: 3 mg/dL (ref 2.3–4.6)

## 2014-02-07 MED ORDER — SODIUM CHLORIDE 0.9 % IV SOLN
250.0000 mL | INTRAVENOUS | Status: DC | PRN
  Administered 2014-02-07: 250 mL via INTRAVENOUS

## 2014-02-07 MED ORDER — SODIUM CHLORIDE 0.9 % IJ SOLN: 3.0000 mL | INTRAMUSCULAR | Status: DC | PRN

## 2014-02-07 MED ORDER — SODIUM CHLORIDE 0.9 % IV SOLN: 100.0000 mL | INTRAVENOUS | Status: DC | PRN

## 2014-02-07 MED ORDER — SODIUM CHLORIDE 0.9 % IJ SOLN
3.0000 mL | Freq: Two times a day (BID) | INTRAMUSCULAR | Status: DC
  Administered 2014-02-07 (×2): 3 mL via INTRAVENOUS

## 2014-02-07 MED ORDER — ALTEPLASE 2 MG IJ SOLR
2.0000 mg | Freq: Once | INTRAMUSCULAR | Status: AC | PRN
  Filled 2014-02-07: qty 2

## 2014-02-07 NOTE — Progress Notes (Signed)
Pt not in need of BIPAP at this time;but unit is on stand by for pt if needed.

## 2014-02-07 NOTE — Progress Notes (Signed)
Subjective: Patient feels much better today. She had dialysis yesterday.  Objective: Vital signs in last 24 hours: Temp:  [97.6 F (36.4 C)-98.3 F (36.8 C)] 98.2 F (36.8 C) (03/22 0800) Pulse Rate:  [60-82] 71 (03/22 0700) Resp:  [13-33] 21 (03/22 0700) BP: (89-139)/(48-102) 129/98 mmHg (03/22 0700) SpO2:  [87 %-100 %] 100 % (03/22 0700) Weight:  [61 kg (134 lb 7.7 oz)-63.8 kg (140 lb 10.5 oz)] 61 kg (134 lb 7.7 oz) (03/22 0500) Weight change: 1.1 kg (2 lb 6.8 oz) Last BM Date: 02/05/14  Intake/Output from previous day: 03/21 0701 - 03/22 0700 In: 450 [P.O.:300; IV Piggyback:150] Out: 2911   PHYSICAL EXAM General appearance: alert and no distress Resp: diminished breath sounds bilaterally and rhonchi bilaterally Cardio: S1, S2 normal GI: soft, non-tender; bowel sounds normal; no masses,  no organomegaly Extremities: extremities normal, atraumatic, no cyanosis or edema  Lab Results:    @labtest @ [facility]  Recent Labs  02/05/14 1540  PHART 7.405  PO2ART 88.9  TCO2 24.3  HCO3 26.5*   CULTURES Recent Results (from the past 240 hour(s))  CULTURE, BLOOD (ROUTINE X 2)     Status: None   Collection Time    02/05/14  1:07 PM      Result Value Ref Range Status   Specimen Description BLOOD LEFT HAND   Final   Special Requests BOTTLES DRAWN AEROBIC AND ANAEROBIC 10CC   Final   Culture NO GROWTH 2 DAYS   Final   Report Status PENDING   Incomplete  CULTURE, BLOOD (ROUTINE X 2)     Status: None   Collection Time    02/05/14  1:13 PM      Result Value Ref Range Status   Specimen Description BLOOD LEFT ARM   Final   Special Requests BOTTLES DRAWN AEROBIC AND ANAEROBIC 6CC   Final   Culture NO GROWTH 2 DAYS   Final   Report Status PENDING   Incomplete  MRSA PCR SCREENING     Status: None   Collection Time    02/05/14  5:38 PM      Result Value Ref Range Status   MRSA by PCR NEGATIVE  NEGATIVE Final   Comment:            The GeneXpert MRSA Assay (FDA     approved for  NASAL specimens     only), is one component of a     comprehensive MRSA colonization     surveillance program. It is not     intended to diagnose MRSA     infection nor to guide or     monitor treatment for     MRSA infections.   Studies/Results: Dg Chest Port 1 View  02/05/2014   CLINICAL DATA:  Shortness of breath  EXAM: PORTABLE CHEST - 1 VIEW  COMPARISON:  DG CHEST 2 VIEW dated 01/19/2014; DG CHEST 2 VIEW dated 12/20/2013; DG CHEST 1V PORT dated 12/17/2013  FINDINGS: Grossly unchanged enlarged cardiac silhouette and mediastinal contours with atherosclerotic plaque within the thoracic aorta. Stable position of support apparatus. The pulmonary vasculature is less distinct on the present examination with cephalization of flow, worse within the right lung. Worsening bibasilar opacities, left greater than right. No change to minimal increase in small bilateral effusions, left greater than right. No definite pneumothorax though note, evaluation degraded secondary to overlying chin. A vascular stent overlies the left axilla. Unchanged bones.  IMPRESSION: Findings most suggestive of worsening asymmetric alveolar pulmonary edema, though note,  underlying infection not excluded. Continued attention on follow-up is recommended.   Electronically Signed   By: Simonne Come M.D.   On: 02/05/2014 13:17    Medications: I have reviewed the patient's current medications.  Assesment: Active Problems:   CKD (chronic kidney disease) stage V requiring chronic dialysis   Aspiration pneumonia   Respiratory failure   Dementia    Plan: Medications reviewed Nephrology consult appreciated Will continue telemetry  Continue current treatment    LOS: 2 days   [name] 02/07/2014, 9:12 AM

## 2014-02-07 NOTE — Progress Notes (Signed)
Subjective: Interval History: has no complaint of nausea or vomiting. She denies any difficulty in breathing. Overall she's feeling much better..  Objective: Vital signs in last 24 hours: Temp:  [97.6 F (36.4 C)-98.3 F (36.8 C)] 98.2 F (36.8 C) (03/22 0800) Pulse Rate:  [60-82] 71 (03/22 0700) Resp:  [13-33] 21 (03/22 0700) BP: (89-139)/(48-102) 129/98 mmHg (03/22 0700) SpO2:  [87 %-100 %] 100 % (03/22 0700) Weight:  [61 kg (134 lb 7.7 oz)-63.8 kg (140 lb 10.5 oz)] 61 kg (134 lb 7.7 oz) (03/22 0500) Weight change: 1.1 kg (2 lb 6.8 oz)  Intake/Output from previous day: 03/21 0701 - 03/22 0700 In: 450 [P.O.:300; IV Piggyback:150] Out: 2911  Intake/Output this shift:    General appearance: alert, cooperative and no distress Resp: clear to auscultation bilaterally Cardio: regular rate and rhythm, S1, S2 normal, no murmur, click, rub or gallop GI: soft, non-tender; bowel sounds normal; no masses,  no organomegaly Extremities: extremities normal, atraumatic, no cyanosis or edema  Lab Results:  Recent Labs  02/06/14 0437 02/07/14 0516  WBC 5.2 4.6  HGB 9.8* 9.7*  HCT 29.9* 29.9*  PLT 120* 150   BMET:  Recent Labs  02/06/14 0437 02/07/14 0516  NA 136* 141  K 3.5* 3.7  CL 96 101  CO2 28 28  GLUCOSE 83 84  BUN 52* 34*  CREATININE 5.08* 4.12*  CALCIUM 8.4 8.2*   No results found for this basename: PTH,  in the last 72 hours Iron Studies: No results found for this basename: IRON, TIBC, TRANSFERRIN, FERRITIN,  in the last 72 hours  Studies/Results: Dg Chest Port 1 View  02/05/2014   CLINICAL DATA:  Shortness of breath  EXAM: PORTABLE CHEST - 1 VIEW  COMPARISON:  DG CHEST 2 VIEW dated 01/19/2014; DG CHEST 2 VIEW dated 12/20/2013; DG CHEST 1V PORT dated 12/17/2013  FINDINGS: Grossly unchanged enlarged cardiac silhouette and mediastinal contours with atherosclerotic plaque within the thoracic aorta. Stable position of support apparatus. The pulmonary vasculature is less  distinct on the present examination with cephalization of flow, worse within the right lung. Worsening bibasilar opacities, left greater than right. No change to minimal increase in small bilateral effusions, left greater than right. No definite pneumothorax though note, evaluation degraded secondary to overlying chin. A vascular stent overlies the left axilla. Unchanged bones.  IMPRESSION: Findings most suggestive of worsening asymmetric alveolar pulmonary edema, though note, underlying infection not excluded. Continued attention on follow-up is recommended.   Electronically Signed   By: Simonne ComeJohn  Watts M.D.   On: 02/05/2014 13:17    I have reviewed the patient's current medications.  Assessment/Plan: Problem #1 end-stage renal disease she status post hemodialysis yesterday. Patient has this moment does not have any uremic sign and symptoms. Her potassium is normal. Problem #2 difficulty in breathing feels much better. Problem #3 hypertension: Her blood pressure is reasonably controlled Problem #4 aspiration pneumonia  Problem #5 anemia: Her hemoglobin and hematocrit is below range. Presently patient on Epogen.  Problem #6 metabolic bone disease: Her calcium and phosphorus isn't range Problem #7 history of dementia Plan: We'll make arrangements for patient to get dialysis tomorrow which is her regular dialysis day. We'll continue his Epogen We'll check her basic metabolic panel in the morning.    LOS: 2 days   Crandall Harvel S 02/07/2014,9:29 AM

## 2014-02-07 NOTE — Progress Notes (Signed)
Report called to L.Covington,RN. Patient transferred to [id number] in stable condition via wheelchair.  

## 2014-02-08 LAB — BASIC METABOLIC PANEL
BUN: 48 mg/dL — ABNORMAL HIGH (ref 6–23)
CALCIUM: 8.6 mg/dL (ref 8.4–10.5)
CO2: 28 mEq/L (ref 19–32)
Chloride: 98 mEq/L (ref 96–112)
Creatinine, Ser: 5.72 mg/dL — ABNORMAL HIGH (ref 0.50–1.10)
GFR calc Af Amer: 7 mL/min — ABNORMAL LOW (ref >90)
GFR calc non Af Amer: 6 mL/min — ABNORMAL LOW (ref >90)
GLUCOSE: 89 mg/dL (ref 70–99)
Potassium: 3.8 mEq/L (ref 3.7–5.3)
Sodium: 138 mEq/L (ref 137–147)

## 2014-02-08 MED ORDER — LEVOFLOXACIN 750 MG PO TABS: 750.0000 mg | ORAL_TABLET | Freq: Every day | ORAL | Status: DC

## 2014-02-08 NOTE — Progress Notes (Signed)
Subjective: She is awake and alert. She seems to be doing better. She has no new complaints.  Objective: Vital signs in last 24 hours: Temp:  [97.9 F (36.6 C)-98.5 F (36.9 C)] 98.2 F (36.8 C) (03/23 0558) Pulse Rate:  [73-78] 73 (03/23 0558) Resp:  [20] 20 (03/23 0558) BP: (133-157)/(50-74) 148/74 mmHg (03/23 0558) SpO2:  [96 %-97 %] 97 % (03/23 0558) Weight change:  Last BM Date: 02/05/14  Intake/Output from previous day: 03/22 0701 - 03/23 0700 In: 870 [P.O.:720; IV Piggyback:150] Out: -   PHYSICAL EXAM General appearance: alert, cooperative and no distress Resp: clear to auscultation bilaterally Cardio: regular rate and rhythm, S1, S2 normal, no murmur, click, rub or gallop GI: soft, non-tender; bowel sounds normal; no masses,  no organomegaly Extremities: extremities normal, atraumatic, no cyanosis or edema  Lab Results:    Basic Metabolic Panel:  Recent Labs  47/82/9503/22/15 0516 02/08/14 0509  NA 141 138  K 3.7 3.8  CL 101 98  CO2 28 28  GLUCOSE 84 89  BUN 34* 48*  CREATININE 4.12* 5.72*  CALCIUM 8.2* 8.6  PHOS 3.0  --    Liver Function Tests:  Recent Labs  02/05/14 1306 02/06/14 0437  AST 14 14  ALT 6 5  ALKPHOS 87 66  BILITOT 0.5 0.3  PROT 7.8 6.4  ALBUMIN 2.6* 2.3*   No results found for this basename: LIPASE, AMYLASE,  in the last 72 hours No results found for this basename: AMMONIA,  in the last 72 hours CBC:  Recent Labs  02/05/14 1306 02/06/14 0437 02/07/14 0516  WBC 12.5* 5.2 4.6  NEUTROABS 10.3*  --   --   HGB 11.7* 9.8* 9.7*  HCT 36.9 29.9* 29.9*  MCV 95.1 94.0 94.0  PLT 161 120* 150   Cardiac Enzymes:  Recent Labs  02/05/14 1652 02/05/14 2235 02/06/14 0436  TROPONINI <0.30 0.38* 0.44*   BNP:  Recent Labs  02/06/14 0437  PROBNP >70000.0*   D-Dimer: No results found for this basename: DDIMER,  in the last 72 hours CBG: No results found for this basename: GLUCAP,  in the last 72 hours Hemoglobin A1C: No  results found for this basename: HGBA1C,  in the last 72 hours Fasting Lipid Panel: No results found for this basename: CHOL, HDL, LDLCALC, TRIG, CHOLHDL, LDLDIRECT,  in the last 72 hours Thyroid Function Tests: No results found for this basename: TSH, T4TOTAL, FREET4, T3FREE, THYROIDAB,  in the last 72 hours Anemia Panel: No results found for this basename: VITAMINB12, FOLATE, FERRITIN, TIBC, IRON, RETICCTPCT,  in the last 72 hours Coagulation: No results found for this basename: LABPROT, INR,  in the last 72 hours Urine Drug Screen: Drugs of Abuse  No results found for this basename: labopia, cocainscrnur, labbenz, amphetmu, thcu, labbarb    Alcohol Level: No results found for this basename: ETH,  in the last 72 hours Urinalysis: No results found for this basename: COLORURINE, APPERANCEUR, LABSPEC, PHURINE, GLUCOSEU, HGBUR, BILIRUBINUR, KETONESUR, PROTEINUR, UROBILINOGEN, NITRITE, LEUKOCYTESUR,  in the last 72 hours Misc. Labs:  ABGS  Recent Labs  02/05/14 1540  PHART 7.405  PO2ART 88.9  TCO2 24.3  HCO3 26.5*   CULTURES Recent Results (from the past 240 hour(s))  CULTURE, BLOOD (ROUTINE X 2)     Status: None   Collection Time    02/05/14  1:07 PM      Result Value Ref Range Status   Specimen Description BLOOD LEFT HAND   Final   Special  Requests BOTTLES DRAWN AEROBIC AND ANAEROBIC 10CC   Final   Culture NO GROWTH 2 DAYS   Final   Report Status PENDING   Incomplete  CULTURE, BLOOD (ROUTINE X 2)     Status: None   Collection Time    02/05/14  1:13 PM      Result Value Ref Range Status   Specimen Description BLOOD LEFT ARM   Final   Special Requests BOTTLES DRAWN AEROBIC AND ANAEROBIC 6CC   Final   Culture NO GROWTH 2 DAYS   Final   Report Status PENDING   Incomplete  MRSA PCR SCREENING     Status: None   Collection Time    02/05/14  5:38 PM      Result Value Ref Range Status   MRSA by PCR NEGATIVE  NEGATIVE Final   Comment:            The GeneXpert MRSA Assay (FDA      approved for NASAL specimens     only), is one component of a     comprehensive MRSA colonization     surveillance program. It is not     intended to diagnose MRSA     infection nor to guide or     monitor treatment for     MRSA infections.   Studies/Results: No results found.  Medications:  Prior to Admission:  Prescriptions prior to admission  Medication Sig Dispense Refill  . donepezil (ARICEPT) 10 MG tablet Take 10 mg by mouth at bedtime.      . ferrous sulfate 325 (65 FE) MG tablet Take 1 tablet (325 mg total) by mouth 2 (two) times daily with a meal.  60 tablet  0  . Memantine HCl ER (NAMENDA XR) 28 MG CP24 Take 28 mg by mouth daily.      . multivitamin (RENA-VIT) TABS tablet Take 1 tablet by mouth daily.      . sevelamer carbonate (RENVELA) 800 MG tablet Take 800 mg by mouth 3 (three) times daily with meals.      . torsemide (DEMADEX) 20 MG tablet Take 20 mg by mouth daily.       Scheduled: . aspirin EC  81 mg Oral Daily  . donepezil  10 mg Oral QHS  . ferrous sulfate  325 mg Oral BID WC  . heparin  5,000 Units Subcutaneous 3 times per day  . Memantine HCl ER  28 mg Oral Daily  . multivitamin  1 tablet Oral Daily  . piperacillin-tazobactam (ZOSYN)  IV  2.25 g Intravenous 3 times per day  . sevelamer carbonate  800 mg Oral TID WC  . sodium chloride  3 mL Intravenous Q12H  . torsemide  20 mg Oral Daily   Continuous:  WUJ:WJXBJY chloride, sodium chloride, sodium chloride, sodium chloride, sodium chloride, feeding supplement (NEPRO CARB STEADY), heparin, heparin, heparin, lidocaine (PF), lidocaine-prilocaine, ondansetron (ZOFRAN) IV, ondansetron, pentafluoroprop-tetrafluoroeth, sodium chloride  Assesment: She was admitted with respiratory failure thought to be related to aspiration pneumonia. She has baseline dementia which is unchanged. She has chronic kidney disease on dialysis and is due for dialysis today Active Problems:   CKD (chronic kidney disease) stage V  requiring chronic dialysis   Aspiration pneumonia   Respiratory failure   Dementia    Plan: She may be able to go home after dialysis    LOS: 3 days   [name] L 02/08/2014, 8:37 AM

## 2014-02-08 NOTE — Procedures (Signed)
   HEMODIALYSIS TREATMENT NOTE:  3.5 hour low heparin dialysis completed via left chest wall catheter.  Exit site unremarkable.  Goal met:  Tolerated removal of 2.2 liters without interruption in ultrafiltration. All blood was reinfused.  Report called to Betha Loa, RN.  Rockwell Alexandria, RN, CDN

## 2014-02-08 NOTE — Progress Notes (Signed)
ANTIBIOTIC CONSULT NOTE   Pharmacy Consult for Zosyn Indication: Aspiration pneumonia  Allergies  Allergen Reactions  . Sulfa Antibiotics Nausea And Vomiting and Rash  . Sulfur Nausea And Vomiting and Rash   Patient Measurements: Height: 5\' 3"  (160 cm) Weight: 134 lb 7.7 oz (61 kg) IBW/kg (Calculated) : 52.4  Vital Signs: Temp: 98.2 F (36.8 C) (03/23 0558) Temp src: Oral (03/23 0558) BP: 148/74 mmHg (03/23 0558) Pulse Rate: 73 (03/23 0558) Intake/Output from previous day: 03/22 0701 - 03/23 0700 In: 870 [P.O.:720; IV Piggyback:150] Out: -  Intake/Output from this shift:    Labs:  Recent Labs  02/05/14 1306 02/06/14 0437 02/07/14 0516 02/08/14 0509  WBC 12.5* 5.2 4.6  --   HGB 11.7* 9.8* 9.7*  --   PLT 161 120* 150  --   CREATININE 3.78* 5.08* 4.12* 5.72*   Estimated Creatinine Clearance: 6.4 ml/min (by C-G formula based on Cr of 5.72). No results found for this basename: VANCOTROUGH, Leodis BinetVANCOPEAK, VANCORANDOM, GENTTROUGH, GENTPEAK, GENTRANDOM, TOBRATROUGH, TOBRAPEAK, TOBRARND, AMIKACINPEAK, AMIKACINTROU, AMIKACIN,  in the last 72 hours   Microbiology: Recent Results (from the past 720 hour(s))  CULTURE, BLOOD (ROUTINE X 2)     Status: None   Collection Time    02/05/14  1:07 PM      Result Value Ref Range Status   Specimen Description BLOOD LEFT HAND   Final   Special Requests BOTTLES DRAWN AEROBIC AND ANAEROBIC 10CC   Final   Culture NO GROWTH 3 DAYS   Final   Report Status PENDING   Incomplete  CULTURE, BLOOD (ROUTINE X 2)     Status: None   Collection Time    02/05/14  1:13 PM      Result Value Ref Range Status   Specimen Description BLOOD LEFT ARM   Final   Special Requests BOTTLES DRAWN AEROBIC AND ANAEROBIC 6CC   Final   Culture NO GROWTH 3 DAYS   Final   Report Status PENDING   Incomplete  MRSA PCR SCREENING     Status: None   Collection Time    02/05/14  5:38 PM      Result Value Ref Range Status   MRSA by PCR NEGATIVE  NEGATIVE Final   Comment:            The GeneXpert MRSA Assay (FDA     approved for NASAL specimens     only), is one component of a     comprehensive MRSA colonization     surveillance program. It is not     intended to diagnose MRSA     infection nor to guide or     monitor treatment for     MRSA infections.   Medical History: Past Medical History  Diagnosis Date  . Shortness of breath     with exertion  . Pneumonia 05/2013  . Mental disorder     dementia - short term memory  . History of blood transfusion     after fistula ruptured  . Dementia   . Renal disorder     dialysis MWF   Medications:  Scheduled:  . aspirin EC  81 mg Oral Daily  . donepezil  10 mg Oral QHS  . ferrous sulfate  325 mg Oral BID WC  . heparin  5,000 Units Subcutaneous 3 times per day  . Memantine HCl ER  28 mg Oral Daily  . multivitamin  1 tablet Oral Daily  . piperacillin-tazobactam (ZOSYN)  IV  2.25  g Intravenous 3 times per day  . sevelamer carbonate  800 mg Oral TID WC  . sodium chloride  3 mL Intravenous Q12H  . torsemide  20 mg Oral Daily   Assessment: End stage renal disease, dialysis Possible aspiration pneumonia  Goal of Therapy:  Eradicate infection  Plan:  Continue Zosyn 2.25 GM IV every 8 hours Labs per protocol  Valrie Hart A 02/08/2014,11:05 AM

## 2014-02-08 NOTE — Progress Notes (Signed)
Subjective: Interval History: has no complaint of nausea or vomiting. She denies any difficulty in breathing.   Objective: Vital signs in last 24 hours: Temp:  [97.9 F (36.6 C)-98.5 F (36.9 C)] 98.2 F (36.8 C) (03/23 0558) Pulse Rate:  [73-78] 73 (03/23 0558) Resp:  [20] 20 (03/23 0558) BP: (137-148)/(64-74) 148/74 mmHg (03/23 0558) SpO2:  [96 %-97 %] 97 % (03/23 0558) Weight change:   Intake/Output from previous day: 03/22 0701 - 03/23 0700 In: 870 [P.O.:720; IV Piggyback:150] Out: -  Intake/Output this shift:    General appearance: alert, cooperative and no distress Resp: clear to auscultation bilaterally Cardio: regular rate and rhythm, S1, S2 normal, no murmur, click, rub or gallop GI: soft, non-tender; bowel sounds normal; no masses,  no organomegaly Extremities: extremities normal, atraumatic, no cyanosis or edema  Lab Results:  Recent Labs  02/06/14 0437 02/07/14 0516  WBC 5.2 4.6  HGB 9.8* 9.7*  HCT 29.9* 29.9*  PLT 120* 150   BMET:   Recent Labs  02/07/14 0516 02/08/14 0509  NA 141 138  K 3.7 3.8  CL 101 98  CO2 28 28  GLUCOSE 84 89  BUN 34* 48*  CREATININE 4.12* 5.72*  CALCIUM 8.2* 8.6   No results found for this basename: PTH,  in the last 72 hours Iron Studies: No results found for this basename: IRON, TIBC, TRANSFERRIN, FERRITIN,  in the last 72 hours  Studies/Results: No results found.  I have reviewed the patient's current medications.  Assessment/Plan: Problem #1 end-stage renal disease she status post hemodialysis the day before yesterday. Patient has this moment does not have any uremic sign and symptoms. Her potassium is normal. Problem #2 difficulty in breathing feels much better. Problem #3 hypertension: Her blood pressure is reasonably controlled Problem #4 aspiration pneumonia  Problem #5 anemia: Her hemoglobin and hematocrit is below range.  but stable.  Presently patient on Epogen.  Problem #6 metabolic bone disease: Her  calcium and phosphorus is in range Problem #7 history of dementia Plan: We'll make arrangements for patient to get dialysis today We'll continue his Epogen We'll check her basic metabolic panel in the morning.    LOS: 3 days   Zeki Bedrosian S 02/08/2014,10:38 AM

## 2014-02-08 NOTE — Progress Notes (Signed)
UR chart review completed.  

## 2014-02-08 NOTE — Care Management Note (Signed)
    Page 1 of 1   02/08/2014     1:56:40 PM   CARE MANAGEMENT NOTE 02/08/2014  Patient:  [name],[name]   Account Number:  [id number]  Date Initiated:  02/08/2014  Documentation initiated by:  Sharrie RothmanBLACKWELL,Jackolyn Geron C  Subjective/Objective Assessment:   Pt admitted from home with respiratory failure. Pt lives alone and has a son that lives next door and is very active in the care of the pt. Pt is fairly independent with ADL's. [facility] [place] [facility] [place]     Action/Plan:   No CM needs noted. Pt for discharge today.   Anticipated DC Date:  02/08/2014   Anticipated DC Plan:  HOME/SELF CARE      DC Planning Services  CM consult      Choice offered to / List presented to:             Status of service:  Completed, signed off Medicare Important Message given?  YES (If response is "NO", the following Medicare IM given date fields will be blank) Date Medicare IM given:  02/08/2014 Date Additional Medicare IM given:    Discharge Disposition:  HOME/SELF CARE  Per UR Regulation:    If discussed at Long Length of Stay Meetings, dates discussed:    Comments:  02/08/14 1350 Arlyss Queenammy Keyonna [place], RN BSN CM

## 2014-02-08 NOTE — Progress Notes (Signed)
Pt to go to dialysis around 4pm today.  Pt's son, Onalee HuaDavid, at bedside.  Went over all d/c instructions with patient and Onalee HuaDavid.  [facility]  Pt's son aware of when and how patient should take new and current medications. Pt's son wishes to bring patient home tonight after dialysis, Dr Juanetta GoslingHawkins aware of late discharge time, pt's son aware as well.

## 2014-02-09 NOTE — Discharge Summary (Signed)
Physician Discharge Summary  Patient ID: [name] MRN: [id number] DOB/AGE: 78/11/1930 78 y.o. Primary Care Physician:Dashiel Bergquist L, MD Admit date: 02/05/2014 Discharge date: 02/09/2014    Discharge Diagnoses:   Active Problems:   CKD (chronic kidney disease) stage V requiring chronic dialysis   Aspiration pneumonia   Respiratory failure   Dementia     Medication List         donepezil 10 MG tablet  Commonly known as:  ARICEPT  Take 10 mg by mouth at bedtime.     ferrous sulfate 325 (65 FE) MG tablet  Take 1 tablet (325 mg total) by mouth 2 (two) times daily with a meal.     levofloxacin 750 MG tablet  Commonly known as:  LEVAQUIN  Take 1 tablet (750 mg total) by mouth daily.     multivitamin Tabs tablet  Take 1 tablet by mouth daily.     NAMENDA XR 28 MG Cp24  Generic drug:  Memantine HCl ER  Take 28 mg by mouth daily.     sevelamer carbonate 800 MG tablet  Commonly known as:  RENVELA  Take 800 mg by mouth 3 (three) times daily with meals.     torsemide 20 MG tablet  Commonly known as:  DEMADEX  Take 20 mg by mouth daily.        Discharged Condition: Improved    Consults: Nephrology  Significant Diagnostic Studies: Dg Chest 2 View  01/19/2014   CLINICAL DATA:  Preoperative film.  EXAM: CHEST  2 VIEW  COMPARISON:  Chest x-ray 12/20/2013.  FINDINGS: Left-sided internal jugular PermCath with tips terminating in the right atrium and likely within the intrahepatic portion of the inferior vena cava. Left subclavian stent. Lung volumes are low. Cephalization of the pulmonary vasculature. Indistinct interstitial markings and patchy ill-defined foci of potential airspace disease scattered throughout the lungs bilaterally. Elevation of the left hemidiaphragm. Blunting of the left costophrenic sulcus which may suggest a small left pleural effusion. Mild cardiomegaly. Mediastinal contours are distorted by patient positioning, but appear similar to prior studies.  Atherosclerosis in the thoracic aorta. Numerous old healed posterolateral right-sided rib fractures.  IMPRESSION: 1. Postoperative changes and support apparatus, as above, similar prior study. 2. Pulmonary venous congestion with patchy interstitial discretion pulmonary venous congestion with diffuse interstitial prominence and patchy airspace disease scattered throughout the lungs bilaterally, favored to reflect a background of mild to moderate pulmonary edema. There is also a small left pleural effusion. Given the mild cardiomegaly, findings may reflect congestive heart failure. 3. Atherosclerosis.   Electronically Signed   By: Trudie Reedaniel  Entrikin M.D.   On: 01/19/2014 07:11   Dg Chest Port 1 View  02/05/2014   CLINICAL DATA:  Shortness of breath  EXAM: PORTABLE CHEST - 1 VIEW  COMPARISON:  DG CHEST 2 VIEW dated 01/19/2014; DG CHEST 2 VIEW dated 12/20/2013; DG CHEST 1V PORT dated 12/17/2013  FINDINGS: Grossly unchanged enlarged cardiac silhouette and mediastinal contours with atherosclerotic plaque within the thoracic aorta. Stable position of support apparatus. The pulmonary vasculature is less distinct on the present examination with cephalization of flow, worse within the right lung. Worsening bibasilar opacities, left greater than right. No change to minimal increase in small bilateral effusions, left greater than right. No definite pneumothorax though note, evaluation degraded secondary to overlying chin. A vascular stent overlies the left axilla. Unchanged bones.  IMPRESSION: Findings most suggestive of worsening asymmetric alveolar pulmonary edema, though note, underlying infection not excluded. Continued attention on follow-up is  recommended.   Electronically Signed   By: Simonne Come M.D.   On: 02/05/2014 13:17    Lab Results: Basic Metabolic Panel:  Recent Labs  16/10/96 0516 02/08/14 0509  NA 141 138  K 3.7 3.8  CL 101 98  CO2 28 28  GLUCOSE 84 89  BUN 34* 48*  CREATININE 4.12* 5.72*  CALCIUM  8.2* 8.6  PHOS 3.0  --    Liver Function Tests: No results found for this basename: AST, ALT, ALKPHOS, BILITOT, PROT, ALBUMIN,  in the last 72 hours   CBC:  Recent Labs  02/07/14 0516  WBC 4.6  HGB 9.7*  HCT 29.9*  MCV 94.0  PLT 150    Recent Results (from the past 240 hour(s))  CULTURE, BLOOD (ROUTINE X 2)     Status: None   Collection Time    02/05/14  1:07 PM      Result Value Ref Range Status   Specimen Description BLOOD LEFT HAND   Final   Special Requests BOTTLES DRAWN AEROBIC AND ANAEROBIC 10CC   Final   Culture NO GROWTH 3 DAYS   Final   Report Status PENDING   Incomplete  CULTURE, BLOOD (ROUTINE X 2)     Status: None   Collection Time    02/05/14  1:13 PM      Result Value Ref Range Status   Specimen Description BLOOD LEFT ARM   Final   Special Requests BOTTLES DRAWN AEROBIC AND ANAEROBIC 6CC   Final   Culture NO GROWTH 3 DAYS   Final   Report Status PENDING   Incomplete  MRSA PCR SCREENING     Status: None   Collection Time    02/05/14  5:38 PM      Result Value Ref Range Status   MRSA by PCR NEGATIVE  NEGATIVE Final   Comment:            The GeneXpert MRSA Assay (FDA     approved for NASAL specimens     only), is one component of a     comprehensive MRSA colonization     surveillance program. It is not     intended to diagnose MRSA     infection nor to guide or     monitor treatment for     MRSA infections.     Hospital Course: This is an 80 year old who was undergoing dialysis when apparently she aspirated. She became acutely short of breath and came to the emergency department. She did not show pneumonia on her chest x-ray. She has baseline dementia so can't give much history. She had dialysis in the hospital with removal of some fluid. She was placed on antibiotics and improved. By the time of discharge she was back at baseline her chest was clear she was not coughing. She underwent dialysis on the day of discharge was discharged after  that  Discharge Exam: Blood pressure 137/75, pulse 79, temperature 97.7 F (36.5 C), temperature source Oral, resp. rate 16, height 5\' 3"  (1.6 m), weight 61 kg (134 lb 7.7 oz), SpO2 97.00%. She is awake and alert. Her chest is clear. Her heart is regular. She looks comfortable.  Disposition: Home to continue outpatient dialysis      Discharge Orders   Future Orders Complete By Expires   Discharge patient  As directed    Comments:     After dialysis        Signed: Paislee Szatkowski L   02/09/2014, 8:15 AM

## 2014-02-10 LAB — CULTURE, BLOOD (ROUTINE X 2)
CULTURE: NO GROWTH
Culture: NO GROWTH

## 2014-02-16 ENCOUNTER — Ambulatory Visit: Payer: Self-pay | Admitting: Vascular Surgery

## 2014-02-16 DIAGNOSIS — N181 Chronic kidney disease, stage 1: Secondary | ICD-10-CM

## 2014-02-16 LAB — BASIC METABOLIC PANEL
ANION GAP: 5 — AB (ref 7–16)
BUN: 48 mg/dL — AB (ref 7–18)
CALCIUM: 8 mg/dL — AB (ref 8.5–10.1)
CHLORIDE: 97 mmol/L — AB (ref 98–107)
CO2: 30 mmol/L (ref 21–32)
Creatinine: 5.18 mg/dL — ABNORMAL HIGH (ref 0.60–1.30)
EGFR (African American): 8 — ABNORMAL LOW
GFR CALC NON AF AMER: 7 — AB
Glucose: 88 mg/dL (ref 65–99)
OSMOLALITY: 277 (ref 275–301)
Potassium: 3.9 mmol/L (ref 3.5–5.1)
SODIUM: 132 mmol/L — AB (ref 136–145)

## 2014-02-16 LAB — CBC
HCT: 36.4 % (ref 35.0–47.0)
HGB: 11.8 g/dL — ABNORMAL LOW (ref 12.0–16.0)
MCH: 30.1 pg (ref 26.0–34.0)
MCHC: 32.3 g/dL (ref 32.0–36.0)
MCV: 93 fL (ref 80–100)
Platelet: 182 10*3/uL (ref 150–440)
RBC: 3.9 10*6/uL (ref 3.80–5.20)
RDW: 18 % — ABNORMAL HIGH (ref 11.5–14.5)
WBC: 6.7 10*3/uL (ref 3.6–11.0)

## 2014-02-24 ENCOUNTER — Ambulatory Visit: Payer: Self-pay | Admitting: Vascular Surgery

## 2014-03-30 ENCOUNTER — Ambulatory Visit: Payer: Self-pay | Admitting: Physician Assistant

## 2014-04-02 ENCOUNTER — Inpatient Hospital Stay: Payer: Self-pay | Admitting: Internal Medicine

## 2014-04-02 LAB — URINALYSIS, COMPLETE
Bilirubin,UR: NEGATIVE
Glucose,UR: NEGATIVE mg/dL (ref 0–75)
Ketone: NEGATIVE
Nitrite: NEGATIVE
Ph: 8 (ref 4.5–8.0)
Protein: 500
Specific Gravity: 1.005 (ref 1.003–1.030)

## 2014-04-02 LAB — CBC WITH DIFFERENTIAL/PLATELET
BASOS ABS: 0.1 10*3/uL (ref 0.0–0.1)
BASOS PCT: 1.2 %
Eosinophil #: 0.2 10*3/uL (ref 0.0–0.7)
Eosinophil %: 4.5 %
HCT: 33.6 % — ABNORMAL LOW (ref 35.0–47.0)
HGB: 10.8 g/dL — AB (ref 12.0–16.0)
LYMPHS ABS: 0.9 10*3/uL — AB (ref 1.0–3.6)
LYMPHS PCT: 18.6 %
MCH: 30.3 pg (ref 26.0–34.0)
MCHC: 32.3 g/dL (ref 32.0–36.0)
MCV: 94 fL (ref 80–100)
Monocyte #: 0.2 x10 3/mm (ref 0.2–0.9)
Monocyte %: 4.4 %
Neutrophil #: 3.5 10*3/uL (ref 1.4–6.5)
Neutrophil %: 71.3 %
PLATELETS: 116 10*3/uL — AB (ref 150–440)
RBC: 3.57 10*6/uL — ABNORMAL LOW (ref 3.80–5.20)
RDW: 16.8 % — AB (ref 11.5–14.5)
WBC: 4.9 10*3/uL (ref 3.6–11.0)

## 2014-04-02 LAB — COMPREHENSIVE METABOLIC PANEL
ALBUMIN: 3.1 g/dL — AB (ref 3.4–5.0)
ANION GAP: 8 (ref 7–16)
Alkaline Phosphatase: 95 U/L
BILIRUBIN TOTAL: 0.6 mg/dL (ref 0.2–1.0)
BUN: 45 mg/dL — ABNORMAL HIGH (ref 7–18)
CREATININE: 5.16 mg/dL — AB (ref 0.60–1.30)
Calcium, Total: 8.9 mg/dL (ref 8.5–10.1)
Chloride: 98 mmol/L (ref 98–107)
Co2: 32 mmol/L (ref 21–32)
GFR CALC AF AMER: 8 — AB
GFR CALC NON AF AMER: 7 — AB
Glucose: 88 mg/dL (ref 65–99)
Osmolality: 287 (ref 275–301)
POTASSIUM: 3.8 mmol/L (ref 3.5–5.1)
SGOT(AST): 23 U/L (ref 15–37)
SGPT (ALT): 11 U/L — ABNORMAL LOW (ref 12–78)
Sodium: 138 mmol/L (ref 136–145)
Total Protein: 7.9 g/dL (ref 6.4–8.2)

## 2014-04-02 LAB — TROPONIN I: Troponin-I: 0.04 ng/mL

## 2014-04-02 LAB — LIPASE, BLOOD: Lipase: 146 U/L (ref 73–393)

## 2014-04-03 LAB — CBC WITH DIFFERENTIAL/PLATELET
BASOS ABS: 0 10*3/uL (ref 0.0–0.1)
BASOS PCT: 1 %
EOS ABS: 0.3 10*3/uL (ref 0.0–0.7)
EOS PCT: 7.8 %
HCT: 29.1 % — AB (ref 35.0–47.0)
HGB: 9.3 g/dL — ABNORMAL LOW (ref 12.0–16.0)
LYMPHS ABS: 0.9 10*3/uL — AB (ref 1.0–3.6)
Lymphocyte %: 19.9 %
MCH: 30.3 pg (ref 26.0–34.0)
MCHC: 32.1 g/dL (ref 32.0–36.0)
MCV: 95 fL (ref 80–100)
MONOS PCT: 5.3 %
Monocyte #: 0.2 x10 3/mm (ref 0.2–0.9)
Neutrophil #: 2.9 10*3/uL (ref 1.4–6.5)
Neutrophil %: 66 %
Platelet: 89 10*3/uL — ABNORMAL LOW (ref 150–440)
RBC: 3.08 10*6/uL — ABNORMAL LOW (ref 3.80–5.20)
RDW: 17.2 % — ABNORMAL HIGH (ref 11.5–14.5)
WBC: 4.3 10*3/uL (ref 3.6–11.0)

## 2014-04-03 LAB — RENAL FUNCTION PANEL
ALBUMIN: 2.7 g/dL — AB (ref 3.4–5.0)
Anion Gap: 5 — ABNORMAL LOW (ref 7–16)
BUN: 59 mg/dL — AB (ref 7–18)
CHLORIDE: 97 mmol/L — AB (ref 98–107)
CO2: 35 mmol/L — AB (ref 21–32)
CREATININE: 6.42 mg/dL — AB (ref 0.60–1.30)
Calcium, Total: 8.5 mg/dL (ref 8.5–10.1)
EGFR (African American): 6 — ABNORMAL LOW
EGFR (Non-African Amer.): 6 — ABNORMAL LOW
Glucose: 118 mg/dL — ABNORMAL HIGH (ref 65–99)
Osmolality: 291 (ref 275–301)
POTASSIUM: 4.1 mmol/L (ref 3.5–5.1)
Phosphorus: 4.9 mg/dL (ref 2.5–4.9)
Sodium: 137 mmol/L (ref 136–145)

## 2014-04-03 LAB — BASIC METABOLIC PANEL
Anion Gap: 8 (ref 7–16)
BUN: 54 mg/dL — ABNORMAL HIGH (ref 7–18)
CALCIUM: 8.3 mg/dL — AB (ref 8.5–10.1)
Chloride: 99 mmol/L (ref 98–107)
Co2: 32 mmol/L (ref 21–32)
Creatinine: 6.19 mg/dL — ABNORMAL HIGH (ref 0.60–1.30)
EGFR (Non-African Amer.): 6 — ABNORMAL LOW
GFR CALC AF AMER: 7 — AB
Glucose: 84 mg/dL (ref 65–99)
Osmolality: 291 (ref 275–301)
POTASSIUM: 3.9 mmol/L (ref 3.5–5.1)
Sodium: 139 mmol/L (ref 136–145)

## 2014-04-05 LAB — RENAL FUNCTION PANEL
Albumin: 2.7 g/dL — ABNORMAL LOW (ref 3.4–5.0)
Anion Gap: 6 — ABNORMAL LOW (ref 7–16)
BUN: 54 mg/dL — ABNORMAL HIGH (ref 7–18)
CALCIUM: 8 mg/dL — AB (ref 8.5–10.1)
CO2: 31 mmol/L (ref 21–32)
Chloride: 99 mmol/L (ref 98–107)
Creatinine: 6.07 mg/dL — ABNORMAL HIGH (ref 0.60–1.30)
EGFR (African American): 7 — ABNORMAL LOW
EGFR (Non-African Amer.): 6 — ABNORMAL LOW
GLUCOSE: 142 mg/dL — AB (ref 65–99)
Osmolality: 289 (ref 275–301)
Phosphorus: 4.3 mg/dL (ref 2.5–4.9)
Potassium: 3.9 mmol/L (ref 3.5–5.1)
Sodium: 136 mmol/L (ref 136–145)

## 2014-04-05 LAB — CBC WITH DIFFERENTIAL/PLATELET
Basophil #: 0.1 10*3/uL (ref 0.0–0.1)
Basophil #: 0 10*3/uL (ref 0.0–0.1)
Basophil %: 1 %
Basophil %: 1 %
EOS PCT: 9.4 %
Eosinophil #: 0.5 10*3/uL (ref 0.0–0.7)
Eosinophil #: 0.4 10*3/uL (ref 0.0–0.7)
Eosinophil %: 8.8 %
HCT: 28.1 % — AB (ref 35.0–47.0)
HCT: 28.8 % — ABNORMAL LOW (ref 35.0–47.0)
HGB: 9 g/dL — AB (ref 12.0–16.0)
HGB: 9.4 g/dL — AB (ref 12.0–16.0)
Lymphocyte #: 1 10*3/uL (ref 1.0–3.6)
Lymphocyte #: 0.9 10*3/uL — ABNORMAL LOW (ref 1.0–3.6)
Lymphocyte %: 20 %
Lymphocyte %: 19.4 %
MCH: 30.1 pg (ref 26.0–34.0)
MCH: 30.7 pg (ref 26.0–34.0)
MCHC: 32.1 g/dL (ref 32.0–36.0)
MCHC: 32.6 g/dL (ref 32.0–36.0)
MCV: 94 fL (ref 80–100)
MCV: 94 fL (ref 80–100)
MONO ABS: 0.3 x10 3/mm (ref 0.2–0.9)
MONOS PCT: 3.1 %
Monocyte #: 0.1 x10 3/mm — ABNORMAL LOW (ref 0.2–0.9)
Monocyte %: 5.5 %
NEUTROS ABS: 3.3 10*3/uL (ref 1.4–6.5)
NEUTROS PCT: 67.7 %
Neutrophil #: 3 10*3/uL (ref 1.4–6.5)
Neutrophil %: 64.1 %
Platelet: 106 10*3/uL — ABNORMAL LOW (ref 150–440)
Platelet: 117 10*3/uL — ABNORMAL LOW (ref 150–440)
RBC: 3.01 10*6/uL — ABNORMAL LOW (ref 3.80–5.20)
RBC: 3.05 10*6/uL — AB (ref 3.80–5.20)
RDW: 16.9 % — AB (ref 11.5–14.5)
RDW: 16.4 % — AB (ref 11.5–14.5)
WBC: 5.2 10*3/uL (ref 3.6–11.0)
WBC: 4.5 10*3/uL (ref 3.6–11.0)

## 2014-04-07 LAB — CULTURE, BLOOD (SINGLE)

## 2014-04-07 LAB — URINE CULTURE

## 2014-04-20 ENCOUNTER — Ambulatory Visit (HOSPITAL_COMMUNITY)
Admission: RE | Admit: 2014-04-20 | Discharge: 2014-04-20 | Disposition: A | Discharge status: Home / Self Care | Payer: Medicare HMO | Source: Ambulatory Visit | Attending: Pulmonary Disease | Admitting: Pulmonary Disease

## 2014-04-20 ENCOUNTER — Other Ambulatory Visit (HOSPITAL_COMMUNITY): Payer: Self-pay | Admitting: Pulmonary Disease

## 2014-04-20 DIAGNOSIS — M25552 Pain in left hip: Secondary | ICD-10-CM

## 2014-04-20 DIAGNOSIS — M25559 Pain in unspecified hip: Secondary | ICD-10-CM | POA: Insufficient documentation

## 2014-04-20 DIAGNOSIS — M259 Joint disorder, unspecified: Secondary | ICD-10-CM | POA: Insufficient documentation

## 2014-04-24 ENCOUNTER — Encounter (HOSPITAL_COMMUNITY): Payer: Self-pay | Admitting: Emergency Medicine

## 2014-04-24 ENCOUNTER — Emergency Department (HOSPITAL_COMMUNITY): Payer: Medicare HMO

## 2014-04-24 ENCOUNTER — Emergency Department (HOSPITAL_COMMUNITY)
Admission: EM | Admit: 2014-04-24 | Discharge: 2014-04-24 | Disposition: A | Discharge status: Home / Self Care | Payer: Medicare HMO | Attending: Emergency Medicine | Admitting: Emergency Medicine

## 2014-04-24 DIAGNOSIS — N186 End stage renal disease: Secondary | ICD-10-CM | POA: Insufficient documentation

## 2014-04-24 DIAGNOSIS — F0390 Unspecified dementia without behavioral disturbance: Secondary | ICD-10-CM | POA: Insufficient documentation

## 2014-04-24 DIAGNOSIS — M7989 Other specified soft tissue disorders: Secondary | ICD-10-CM | POA: Insufficient documentation

## 2014-04-24 DIAGNOSIS — R509 Fever, unspecified: Secondary | ICD-10-CM | POA: Insufficient documentation

## 2014-04-24 DIAGNOSIS — Z992 Dependence on renal dialysis: Secondary | ICD-10-CM | POA: Insufficient documentation

## 2014-04-24 DIAGNOSIS — N390 Urinary tract infection, site not specified: Secondary | ICD-10-CM | POA: Insufficient documentation

## 2014-04-24 DIAGNOSIS — Z79899 Other long term (current) drug therapy: Secondary | ICD-10-CM | POA: Insufficient documentation

## 2014-04-24 DIAGNOSIS — Z8701 Personal history of pneumonia (recurrent): Secondary | ICD-10-CM | POA: Insufficient documentation

## 2014-04-24 LAB — URINALYSIS, ROUTINE W REFLEX MICROSCOPIC
Glucose, UA: NEGATIVE mg/dL
Ketones, ur: NEGATIVE mg/dL
Leukocytes, UA: NEGATIVE
Nitrite: NEGATIVE
PH: 7.5 (ref 5.0–8.0)
Protein, ur: >300 mg/dL — AB
Specific Gravity, Urine: 1.015 (ref 1.005–1.030)
Urobilinogen, UA: 0.2 mg/dL (ref 0.0–1.0)

## 2014-04-24 LAB — CBC WITH DIFFERENTIAL/PLATELET
BASOS ABS: 0 10*3/uL (ref 0.0–0.1)
Basophils Relative: 1 % (ref 0–1)
EOS PCT: 6 % — AB (ref 0–5)
Eosinophils Absolute: 0.3 10*3/uL (ref 0.0–0.7)
HEMATOCRIT: 29.1 % — AB (ref 36.0–46.0)
Hemoglobin: 8.9 g/dL — ABNORMAL LOW (ref 12.0–15.0)
LYMPHS ABS: 0.7 10*3/uL (ref 0.7–4.0)
LYMPHS PCT: 15 % (ref 12–46)
MCH: 29.9 pg (ref 26.0–34.0)
MCHC: 30.6 g/dL (ref 30.0–36.0)
MCV: 97.7 fL (ref 78.0–100.0)
MONO ABS: 0.3 10*3/uL (ref 0.1–1.0)
Monocytes Relative: 6 % (ref 3–12)
NEUTROS ABS: 3.5 10*3/uL (ref 1.7–7.7)
Neutrophils Relative %: 72 % (ref 43–77)
Platelets: 87 10*3/uL — ABNORMAL LOW (ref 150–400)
RBC: 2.98 MIL/uL — ABNORMAL LOW (ref 3.87–5.11)
RDW: 18.9 % — ABNORMAL HIGH (ref 11.5–15.5)
WBC: 4.8 10*3/uL (ref 4.0–10.5)

## 2014-04-24 LAB — BASIC METABOLIC PANEL
BUN: 28 mg/dL — ABNORMAL HIGH (ref 6–23)
CALCIUM: 8.3 mg/dL — AB (ref 8.4–10.5)
CHLORIDE: 99 meq/L (ref 96–112)
CO2: 32 meq/L (ref 19–32)
Creatinine, Ser: 4.26 mg/dL — ABNORMAL HIGH (ref 0.50–1.10)
GFR calc Af Amer: 10 mL/min — ABNORMAL LOW (ref >90)
GFR calc non Af Amer: 9 mL/min — ABNORMAL LOW (ref >90)
GLUCOSE: 66 mg/dL — AB (ref 70–99)
Potassium: 3.4 mEq/L — ABNORMAL LOW (ref 3.7–5.3)
Sodium: 141 mEq/L (ref 137–147)

## 2014-04-24 LAB — URINE MICROSCOPIC-ADD ON

## 2014-04-24 MED ORDER — CIPROFLOXACIN HCL 250 MG PO TABS: 250.0000 mg | ORAL_TABLET | Freq: Every day | ORAL | Status: DC

## 2014-04-24 MED ORDER — CIPROFLOXACIN HCL 250 MG PO TABS
500.0000 mg | ORAL_TABLET | Freq: Once | ORAL | Status: AC
  Administered 2014-04-24: 500 mg via ORAL
  Filled 2014-04-24: qty 2

## 2014-04-24 NOTE — ED Notes (Signed)
Family at bedside. Patient very sleepy at this time. But will respond when named called.

## 2014-04-24 NOTE — Discharge Instructions (Signed)
Urinary Tract Infection  Urinary tract infections (UTIs) can develop anywhere along your urinary tract. Your urinary tract is your body's drainage system for removing wastes and extra water. Your urinary tract includes two kidneys, two ureters, a bladder, and a urethra. Your kidneys are a pair of bean-shaped organs. Each kidney is about the size of your fist. They are located below your ribs, one on each side of your spine.  CAUSES  Infections are caused by microbes, which are microscopic organisms, including fungi, viruses, and bacteria. These organisms are so small that they can only be seen through a microscope. Bacteria are the microbes that most commonly cause UTIs.  SYMPTOMS   Symptoms of UTIs may vary by age and gender of the patient and by the location of the infection. Symptoms in young women typically include a frequent and intense urge to urinate and a painful, burning feeling in the bladder or urethra during urination. Older women and men are more likely to be tired, shaky, and weak and have muscle aches and abdominal pain. A fever may mean the infection is in your kidneys. Other symptoms of a kidney infection include pain in your back or sides below the ribs, nausea, and vomiting.  DIAGNOSIS  To diagnose a UTI, your caregiver will ask you about your symptoms. Your caregiver also will ask to provide a urine sample. The urine sample will be tested for bacteria and white blood cells. White blood cells are made by your body to help fight infection.  TREATMENT   Typically, UTIs can be treated with medication. Because most UTIs are caused by a bacterial infection, they usually can be treated with the use of antibiotics. The choice of antibiotic and length of treatment depend on your symptoms and the type of bacteria causing your infection.  HOME CARE INSTRUCTIONS   If you were prescribed antibiotics, take them exactly as your caregiver instructs you. Finish the medication even if you feel better after you  have only taken some of the medication.   Drink enough water and fluids to keep your urine clear or pale yellow.   Avoid caffeine, tea, and carbonated beverages. They tend to irritate your bladder.   Empty your bladder often. Avoid holding urine for long periods of time.   Empty your bladder before and after sexual intercourse.   After a bowel movement, women should cleanse from front to back. Use each tissue only once.  SEEK MEDICAL CARE IF:    You have back pain.   You develop a fever.   Your symptoms do not begin to resolve within 3 days.  SEEK IMMEDIATE MEDICAL CARE IF:    You have severe back pain or lower abdominal pain.   You develop chills.   You have nausea or vomiting.   You have continued burning or discomfort with urination.  MAKE SURE YOU:    Understand these instructions.   Will watch your condition.   Will get help right away if you are not doing well or get worse.  Document Released: 08/15/2005 Document Revised: 05/06/2012 Document Reviewed: 12/14/2011  [employer] Patient Information 2014 [employer]

## 2014-04-24 NOTE — ED Provider Notes (Signed)
CSN: [id number]     Arrival date & time 04/24/14  1818 History   This chart was scribed for Flint MelterElliott L Antwoine Zorn, MD by Chestine SporeSoijett Blue, ED Scribe. The patient was seen in room APA09/APA09 at 7:19 PM.   Chief Complaint  Patient presents with  . Weakness   The history is provided by a relative. No language interpreter was used.   HPI Comments: [name] is a 80 y.o. female with h/o dementia who presents to the Emergency Department with increasing weakness onset 2 weeks ago. Relatives report that she has a fever, last Her relatives took her temperature today and it was 95. Pt relative states she was recently admitted for an UTI. He states that she is currently on dialysis 3 days a week.  Pt last went to dialysis yesterday. He states that she has associated symptoms of diaphoresis. He states that she has infrequent trips to the restroom. He states that [facility] and they discharged her with O2 and a Rx for Amoxicillin, 7 pills. He was later called about the Cipro Rx. When the Cipro Rx was filled it was only 4 pills. He states that the O2 has helped recently to alleviate some of her symptoms.  Pt needs assistance at baseline to ambulate. Relative denies any other symptoms.  Past Medical History  Diagnosis Date  . Shortness of breath     with exertion  . Pneumonia 05/2013  . Mental disorder     dementia - short term memory  . History of blood transfusion     after fistula ruptured  . Dementia   . Renal disorder     dialysis MWF   Past Surgical History  Procedure Laterality Date  . Dialysis fistula creation    . Ligation of arteriovenous  fistula Left 06/04/2013    Procedure: LIGATION OF ARTERIOVENOUS  FISTULA;  Surgeon: Fransisco HertzBrian L Chen, MD;  [facility];  Service: Vascular;  Laterality: Left;  . Insertion of dialysis catheter Right 06/04/2013    Procedure: INSERTION OF DIALYSIS CATHETER;  Surgeon: Fransisco HertzBrian L Chen, MD;  [facility];  Service: Vascular;  Laterality: Right;  .  Cholecystectomy  1992  . Av fistula placement Right 07/07/2013    Procedure: ARTERIOVENOUS (AV) FISTULA CREATION - RIGHT BRACHIAL CEPHALIC ;  Surgeon: Fransisco HertzBrian L Chen, MD;  [facility];  Service: Vascular;  Laterality: Right;  . Removal of a dialysis catheter Right 11/09/2013    Procedure: REMOVAL OF A DIALYSIS CATHETER- Right TDC ;  Surgeon: Fransisco HertzBrian L Chen, MD;  [facility];  Service: Vascular;  Laterality: Right;  . Insertion of dialysis catheter Left 11/09/2013    Procedure: INSERTION OF DIALYSIS CATHETER- Left TDC;  Surgeon: Fransisco HertzBrian L Chen, MD;  [facility];  Service: Vascular;  Laterality: Left;  . Exchange of a dialysis catheter Left 12/17/2013    Procedure: EXCHANGE OF A DIALYSIS CATHETER;  Surgeon: Chuck Hinthristopher S Dickson, MD;  [facility];  Service: Vascular;  Laterality: Left;  . Fistulogram Right 12/17/2013    Procedure: FISTULOGRAM- RIGHT ARM- POSSIBLE INTERVENTION;  Surgeon: Chuck Hinthristopher S Dickson, MD;  [facility];  Service: Vascular;  Laterality: Right;  . Ligation of competing branches of arteriovenous fistula Right 12/17/2013    Procedure: LIGATION OF COMPETING BRANCHES OF ARTERIOVENOUS FISTULA;  Surgeon: Chuck Hinthristopher S Dickson, MD;  [facility];  Service: Vascular;  Laterality: Right;  . Ligation of arteriovenous  fistula Right 01/19/2014    Procedure:  LIGATION OF ARTERIOVENOUS  FISTULA- RIGHT BRACHIOCEPHALIC FISTULA;  Surgeon: Fransisco Hertz, MD;  [facility] OR;  Service: Vascular;  Laterality: Right;   No family history on file. History  Substance Use Topics  . Smoking status: Never Smoker   . Smokeless tobacco: Never Used  . Alcohol Use: No   OB History   Grav Para Term Preterm Abortions TAB SAB Ect Mult Living                 Review of Systems  Constitutional: Positive for fever. Negative for appetite change.  Cardiovascular: Positive for leg swelling.  Genitourinary: Positive for decreased urine volume.  All other systems reviewed and are  negative.  Allergies  Sulfa antibiotics and Sulfur  Home Medications   Prior to Admission medications   Medication Sig Start Date End Date Taking? Authorizing Provider  donepezil (ARICEPT) 10 MG tablet Take 10 mg by mouth at bedtime.   Yes Historical Provider, MD  Memantine HCl ER (NAMENDA XR) 28 MG CP24 Take 28 mg by mouth daily.   Yes Historical Provider, MD  multivitamin (RENA-VIT) TABS tablet Take 1 tablet by mouth daily.   Yes Historical Provider, MD  sevelamer carbonate (RENVELA) 800 MG tablet Take 800 mg by mouth 3 (three) times daily with meals.   Yes Historical Provider, MD  torsemide (DEMADEX) 20 MG tablet Take 20 mg by mouth daily.   Yes Historical Provider, MD  ciprofloxacin (CIPRO) 250 MG tablet Take 1 tablet (250 mg total) by mouth daily. 04/24/14   Flint Melter, MD   BP 132/65  Pulse 68  Temp(Src) 97.5 F (36.4 C) (Oral)  Resp 18  SpO2 100% Physical Exam  Nursing note and vitals reviewed. Constitutional: She is oriented to person, place, and time. She appears well-developed and well-nourished.  HENT:  Head: Normocephalic and atraumatic.  Mouth/Throat: Oropharynx is clear and moist. No oropharyngeal exudate.  Eyes: Conjunctivae and EOM are normal. Pupils are equal, round, and reactive to light.  Neck: Normal range of motion and phonation normal. Neck supple.  Cardiovascular: Normal rate and intact distal pulses.  An irregular rhythm present.  Pulmonary/Chest: Effort normal. She has rales. She exhibits no tenderness.  Rales halfway up on the right and not on the left.  Abdominal: Soft. She exhibits no distension. There is no tenderness. There is no guarding.  Musculoskeletal: Normal range of motion. She exhibits edema.  1+ LE edema. Vascular graft in the left upper arm.  Neurological: She is alert and oriented to person, place, and time. She exhibits normal muscle tone.  Skin: Skin is warm and dry.  Psychiatric: She has a normal mood and affect. Her behavior is  normal. Judgment and thought content normal.    ED Course  Procedures (including critical care time)  DIAGNOSTIC STUDIES: Oxygen Saturation is 100% on room air , normal by my interpretation.    COORDINATION OF CARE: 7:23 PM-Discussed treatment plan with pt at bedside and pt agreed to plan.   Medications  ciprofloxacin (CIPRO) tablet 500 mg (500 mg Oral Given 04/24/14 2112)    Patient Vitals for the past 24 hrs:  BP Temp Temp src Pulse Resp SpO2  04/24/14 2116 131/53 mmHg - - 71 20 100 %  04/24/14 1900 - - - - - 100 %  04/24/14 1828 132/65 mmHg 97.5 F (36.4 C) Oral 68 18 100 %    Results for orders placed during the hospital encounter of 04/24/14  URINALYSIS, ROUTINE W REFLEX MICROSCOPIC  Result Value Ref Range   Color, Urine YELLOW  YELLOW   APPearance CLOUDY (*) CLEAR   Specific Gravity, Urine 1.015  1.005 - 1.030   pH 7.5  5.0 - 8.0   Glucose, UA NEGATIVE  NEGATIVE mg/dL   Hgb urine dipstick LARGE (*) NEGATIVE   Bilirubin Urine SMALL (*) NEGATIVE   Ketones, ur NEGATIVE  NEGATIVE mg/dL   Protein, ur >295 (*) NEGATIVE mg/dL   Urobilinogen, UA 0.2  0.0 - 1.0 mg/dL   Nitrite NEGATIVE  NEGATIVE   Leukocytes, UA NEGATIVE  NEGATIVE  CBC WITH DIFFERENTIAL      Result Value Ref Range   WBC 4.8  4.0 - 10.5 K/uL   RBC 2.98 (*) 3.87 - 5.11 MIL/uL   Hemoglobin 8.9 (*) 12.0 - 15.0 g/dL   HCT 62.1 (*) 30.8 - 65.7 %   MCV 97.7  78.0 - 100.0 fL   MCH 29.9  26.0 - 34.0 pg   MCHC 30.6  30.0 - 36.0 g/dL   RDW 84.6 (*) 96.2 - 95.2 %   Platelets 87 (*) 150 - 400 K/uL   Neutrophils Relative % 72  43 - 77 %   Neutro Abs 3.5  1.7 - 7.7 K/uL   Lymphocytes Relative 15  12 - 46 %   Lymphs Abs 0.7  0.7 - 4.0 K/uL   Monocytes Relative 6  3 - 12 %   Monocytes Absolute 0.3  0.1 - 1.0 K/uL   Eosinophils Relative 6 (*) 0 - 5 %   Eosinophils Absolute 0.3  0.0 - 0.7 K/uL   Basophils Relative 1  0 - 1 %   Basophils Absolute 0.0  0.0 - 0.1 K/uL  BASIC METABOLIC PANEL      Result Value Ref  Range   Sodium 141  137 - 147 mEq/L   Potassium 3.4 (*) 3.7 - 5.3 mEq/L   Chloride 99  96 - 112 mEq/L   CO2 32  19 - 32 mEq/L   Glucose, Bld 66 (*) 70 - 99 mg/dL   BUN 28 (*) 6 - 23 mg/dL   Creatinine, Ser 8.41 (*) 0.50 - 1.10 mg/dL   Calcium 8.3 (*) 8.4 - 10.5 mg/dL   GFR calc non Af Amer 9 (*) >90 mL/min   GFR calc Af Amer 10 (*) >90 mL/min  URINE MICROSCOPIC-ADD ON      Result Value Ref Range   Squamous Epithelial / LPF FEW (*) RARE   WBC, UA 3-6  <3 WBC/hpf   RBC / HPF 21-50  <3 RBC/hpf   Bacteria, UA FEW (*) RARE   Dg Chest 2 View  04/24/2014   CLINICAL DATA:  Weakness and shortness of breath.  EXAM: CHEST  2 VIEW  COMPARISON:  Chest x-ray 04/02/2014.  FINDINGS: Left internal jugular catheter with tip terminating in the distal superior vena cava. Left subclavian vein stent. Lung volumes are low. There is cephalization of the pulmonary vasculature, indistinctness of the interstitial markings, and patchy airspace disease throughout the lungs bilaterally suggestive of moderate pulmonary edema. Small bilateral pleural effusions. Mild cardiomegaly. Upper mediastinal contours are distorted by patient positioning. Atherosclerosis in the thoracic aorta.  IMPRESSION: 1. The appearance the chest suggests mild to moderate congestive heart failure, as above. 2. Atherosclerosis. 3. Postprocedural changes and support apparatus, as above.   Electronically Signed   By: Trudie Reed M.D.   On: 04/24/2014 20:10   Dg Hip Complete Left  04/20/2014   CLINICAL DATA:  Left  hip pain.  EXAM: LEFT HIP - COMPLETE 2+ VIEW  COMPARISON:  None.  FINDINGS: There is moderate diffuse decreased bone mineralization. There are mild symmetric degenerative changes of the hips. There is no definite acute fracture or dislocation.  IMPRESSION: No acute findings.  Mild symmetric degenerative changes of the hips.   Electronically Signed   By: Elberta Fortis M.D.   On: 04/20/2014 15:00     EKG Interpretation None      MDM    Final diagnoses:  UTI (lower urinary tract infection)    Urinary tract infection in patient with end-stage renal disease, on dialysis. No evidence for pulmonary edema, hypoxia, hyperkalemia, or severe bacterial infection.  Nursing Notes Reviewed/ Care Coordinated Applicable Imaging Reviewed Interpretation of Laboratory Data incorporated into ED treatment  The patient appears reasonably screened and/or stabilized for discharge and I doubt any other medical condition or other [facility] requiring further screening, evaluation, or treatment in the ED at this time prior to discharge.  Plan: Home Medications- Cipro, dose reduced; Home Treatments- rest; return here if the recommended treatment, does not improve the symptoms; Recommended follow up- PCP 1 week for recheck Urine  I personally performed the services described in this documentation, which was scribed in my presence. The recorded information has been reviewed and is accurate.       Flint Melter, MD 04/24/14 [phone]

## 2014-04-24 NOTE — ED Notes (Signed)
Pt c/o generalized weakness, low temperature that started this am, pt was just discharged from [facility] for "work on dialysis catheter and uti", family member at bedside states that pt had same symptoms when she was diagnosed with her uti.

## 2014-04-26 LAB — URINE CULTURE
Colony Count: NO GROWTH
Culture: NO GROWTH

## 2014-05-04 ENCOUNTER — Ambulatory Visit (HOSPITAL_COMMUNITY)
Admission: RE | Admit: 2014-05-04 | Discharge: 2014-05-04 | Disposition: A | Discharge status: Home / Self Care | Payer: Medicare HMO | Source: Ambulatory Visit | Attending: Pulmonary Disease | Admitting: Pulmonary Disease

## 2014-05-04 DIAGNOSIS — N390 Urinary tract infection, site not specified: Secondary | ICD-10-CM | POA: Insufficient documentation

## 2014-05-04 LAB — URINALYSIS, ROUTINE W REFLEX MICROSCOPIC
Bilirubin Urine: NEGATIVE
GLUCOSE, UA: NEGATIVE mg/dL
Ketones, ur: NEGATIVE mg/dL
Leukocytes, UA: NEGATIVE
Nitrite: NEGATIVE
PH: 7 (ref 5.0–8.0)
PROTEIN: 100 mg/dL — AB
SPECIFIC GRAVITY, URINE: 1.015 (ref 1.005–1.030)
Urobilinogen, UA: 0.2 mg/dL (ref 0.0–1.0)

## 2014-05-04 LAB — URINE MICROSCOPIC-ADD ON

## 2014-06-07 ENCOUNTER — Encounter (HOSPITAL_COMMUNITY): Payer: Self-pay | Admitting: Emergency Medicine

## 2014-06-07 ENCOUNTER — Inpatient Hospital Stay (HOSPITAL_COMMUNITY)
Admission: EM | Admit: 2014-06-07 | Discharge: 2014-06-09 | DRG: 689 | Disposition: A | Discharge status: Home Health | Payer: Medicare HMO | Attending: Pulmonary Disease | Admitting: Pulmonary Disease

## 2014-06-07 ENCOUNTER — Emergency Department (HOSPITAL_COMMUNITY): Payer: Medicare HMO

## 2014-06-07 DIAGNOSIS — N2581 Secondary hyperparathyroidism of renal origin: Secondary | ICD-10-CM | POA: Diagnosis present

## 2014-06-07 DIAGNOSIS — N3000 Acute cystitis without hematuria: Secondary | ICD-10-CM

## 2014-06-07 DIAGNOSIS — I509 Heart failure, unspecified: Secondary | ICD-10-CM | POA: Diagnosis present

## 2014-06-07 DIAGNOSIS — N039 Chronic nephritic syndrome with unspecified morphologic changes: Secondary | ICD-10-CM

## 2014-06-07 DIAGNOSIS — Y841 Kidney dialysis as the cause of abnormal reaction of the patient, or of later complication, without mention of misadventure at the time of the procedure: Secondary | ICD-10-CM | POA: Diagnosis not present

## 2014-06-07 DIAGNOSIS — Z9981 Dependence on supplemental oxygen: Secondary | ICD-10-CM

## 2014-06-07 DIAGNOSIS — F0390 Unspecified dementia without behavioral disturbance: Secondary | ICD-10-CM | POA: Diagnosis present

## 2014-06-07 DIAGNOSIS — T827XXA Infection and inflammatory reaction due to other cardiac and vascular devices, implants and grafts, initial encounter: Secondary | ICD-10-CM | POA: Diagnosis not present

## 2014-06-07 DIAGNOSIS — R509 Fever, unspecified: Secondary | ICD-10-CM

## 2014-06-07 DIAGNOSIS — D631 Anemia in chronic kidney disease: Secondary | ICD-10-CM | POA: Diagnosis present

## 2014-06-07 DIAGNOSIS — N390 Urinary tract infection, site not specified: Secondary | ICD-10-CM | POA: Diagnosis not present

## 2014-06-07 DIAGNOSIS — I120 Hypertensive chronic kidney disease with stage 5 chronic kidney disease or end stage renal disease: Secondary | ICD-10-CM | POA: Diagnosis present

## 2014-06-07 DIAGNOSIS — N186 End stage renal disease: Secondary | ICD-10-CM

## 2014-06-07 DIAGNOSIS — E8770 Fluid overload, unspecified: Secondary | ICD-10-CM | POA: Diagnosis present

## 2014-06-07 DIAGNOSIS — Z992 Dependence on renal dialysis: Secondary | ICD-10-CM

## 2014-06-07 DIAGNOSIS — D638 Anemia in other chronic diseases classified elsewhere: Secondary | ICD-10-CM | POA: Diagnosis present

## 2014-06-07 LAB — COMPREHENSIVE METABOLIC PANEL
ALK PHOS: 92 U/L (ref 39–117)
ALT: 6 U/L (ref 0–35)
AST: 13 U/L (ref 0–37)
Albumin: 2.8 g/dL — ABNORMAL LOW (ref 3.5–5.2)
Anion gap: 12 (ref 5–15)
BUN: 25 mg/dL — ABNORMAL HIGH (ref 6–23)
CALCIUM: 8.4 mg/dL (ref 8.4–10.5)
CO2: 27 meq/L (ref 19–32)
Chloride: 95 mEq/L — ABNORMAL LOW (ref 96–112)
Creatinine, Ser: 3.22 mg/dL — ABNORMAL HIGH (ref 0.50–1.10)
GFR calc non Af Amer: 13 mL/min — ABNORMAL LOW (ref >90)
GFR, EST AFRICAN AMERICAN: 14 mL/min — AB (ref >90)
GLUCOSE: 92 mg/dL (ref 70–99)
POTASSIUM: 3.8 meq/L (ref 3.7–5.3)
SODIUM: 134 meq/L — AB (ref 137–147)
Total Bilirubin: 0.4 mg/dL (ref 0.3–1.2)
Total Protein: 7.3 g/dL (ref 6.0–8.3)

## 2014-06-07 LAB — CBC WITH DIFFERENTIAL/PLATELET
BASOS ABS: 0 10*3/uL (ref 0.0–0.1)
Basophils Relative: 1 % (ref 0–1)
Eosinophils Absolute: 0.2 10*3/uL (ref 0.0–0.7)
Eosinophils Relative: 5 % (ref 0–5)
HCT: 32.8 % — ABNORMAL LOW (ref 36.0–46.0)
Hemoglobin: 10.6 g/dL — ABNORMAL LOW (ref 12.0–15.0)
LYMPHS PCT: 14 % (ref 12–46)
Lymphs Abs: 0.7 10*3/uL (ref 0.7–4.0)
MCH: 30.1 pg (ref 26.0–34.0)
MCHC: 32.3 g/dL (ref 30.0–36.0)
MCV: 93.2 fL (ref 78.0–100.0)
Monocytes Absolute: 0.3 10*3/uL (ref 0.1–1.0)
Monocytes Relative: 6 % (ref 3–12)
NEUTROS PCT: 75 % (ref 43–77)
Neutro Abs: 3.7 10*3/uL (ref 1.7–7.7)
PLATELETS: 107 10*3/uL — AB (ref 150–400)
RBC: 3.52 MIL/uL — AB (ref 3.87–5.11)
RDW: 17 % — ABNORMAL HIGH (ref 11.5–15.5)
WBC: 4.9 10*3/uL (ref 4.0–10.5)

## 2014-06-07 LAB — URINALYSIS, ROUTINE W REFLEX MICROSCOPIC
GLUCOSE, UA: NEGATIVE mg/dL
Ketones, ur: NEGATIVE mg/dL
Nitrite: NEGATIVE
Protein, ur: >300 mg/dL — AB
Specific Gravity, Urine: 1.02 (ref 1.005–1.030)
Urobilinogen, UA: 0.2 mg/dL (ref 0.0–1.0)
pH: 7 (ref 5.0–8.0)

## 2014-06-07 LAB — URINE MICROSCOPIC-ADD ON

## 2014-06-07 LAB — TROPONIN I

## 2014-06-07 LAB — LIPASE, BLOOD: Lipase: 58 U/L (ref 11–59)

## 2014-06-07 LAB — LACTIC ACID, PLASMA: Lactic Acid, Venous: 1.6 mmol/L (ref 0.5–2.2)

## 2014-06-07 MED ORDER — PIPERACILLIN-TAZOBACTAM 3.375 G IVPB 30 MIN
3.3750 g | Freq: Once | INTRAVENOUS | Status: AC
Start: 2014-06-07 — End: 2014-06-08
  Administered 2014-06-07: 3.375 g via INTRAVENOUS
  Filled 2014-06-07: qty 50

## 2014-06-07 MED ORDER — ACETAMINOPHEN 325 MG PO TABS
650.0000 mg | ORAL_TABLET | Freq: Once | ORAL | Status: AC
  Administered 2014-06-07: 650 mg via ORAL
  Filled 2014-06-07: qty 2

## 2014-06-07 NOTE — ED Provider Notes (Signed)
CSN: [id number]     Arrival date & time 06/07/14  1747 History   First MD Initiated Contact with Patient 06/07/14 2048     Chief Complaint  Patient presents with  . Fever     (Consider location/radiation/quality/duration/timing/severity/associated sxs/prior Treatment) HPI Comments: Patient from home with fever and weakness that got worse today. Patient has a history of recurrent UTIs and was placed on Cipro July 15. She's been taking 250 mg daily since then. Patient has not improved with this medication. She remains weak and not feeling well. Spiking fevers at home to 101. Her last dialysis was today. She denies any headache, neck pain, chest pain, shortness of breath or abdominal pain. She makes some urine at baseline. Denies any cough, runny nose or sore throat. She wears oxygen at home as needed.  The history is provided by the patient and a relative. The history is limited by the condition of the patient.    Past Medical History  Diagnosis Date  . Shortness of breath     with exertion  . Pneumonia 05/2013  . Mental disorder     dementia - short term memory  . History of blood transfusion     after fistula ruptured  . Dementia   . Renal disorder     dialysis MWF   Past Surgical History  Procedure Laterality Date  . Dialysis fistula creation    . Ligation of arteriovenous  fistula Left 06/04/2013    Procedure: LIGATION OF ARTERIOVENOUS  FISTULA;  Surgeon: Fransisco Hertz, MD;  Location: [facility] OR;  Service: Vascular;  Laterality: Left;  . Insertion of dialysis catheter Right 06/04/2013    Procedure: INSERTION OF DIALYSIS CATHETER;  Surgeon: Fransisco Hertz, MD;  Location: [facility] OR;  Service: Vascular;  Laterality: Right;  . Cholecystectomy  1992  . Av fistula placement Right 07/07/2013    Procedure: ARTERIOVENOUS (AV) FISTULA CREATION - RIGHT BRACHIAL CEPHALIC ;  Surgeon: Fransisco Hertz, MD;  Location: [facility] OR;  Service: Vascular;  Laterality: Right;  . Removal of a dialysis catheter Right  11/09/2013    Procedure: REMOVAL OF A DIALYSIS CATHETER- Right TDC ;  Surgeon: Fransisco Hertz, MD;  [facility];  Service: Vascular;  Laterality: Right;  . Insertion of dialysis catheter Left 11/09/2013    Procedure: INSERTION OF DIALYSIS CATHETER- Left TDC;  Surgeon: Fransisco Hertz, MD;  Location: [facility] OR;  Service: Vascular;  Laterality: Left;  . Exchange of a dialysis catheter Left 12/17/2013    Procedure: EXCHANGE OF A DIALYSIS CATHETER;  Surgeon: Chuck Hint, MD;  Location: [facility] OR;  Service: Vascular;  Laterality: Left;  . Fistulogram Right 12/17/2013    Procedure: FISTULOGRAM- RIGHT ARM- POSSIBLE INTERVENTION;  Surgeon: Chuck Hint, MD;  [facility];  Service: Vascular;  Laterality: Right;  . Ligation of competing branches of arteriovenous fistula Right 12/17/2013    Procedure: LIGATION OF COMPETING BRANCHES OF ARTERIOVENOUS FISTULA;  Surgeon: Chuck Hint, MD;  Location: [facility] OR;  Service: Vascular;  Laterality: Right;  . Ligation of arteriovenous  fistula Right 01/19/2014    Procedure: LIGATION OF ARTERIOVENOUS  FISTULA- RIGHT BRACHIOCEPHALIC FISTULA;  Surgeon: Fransisco Hertz, MD;  Location: [facility] OR;  Service: Vascular;  Laterality: Right;   History reviewed. No pertinent family history. History  Substance Use Topics  . Smoking status: Never Smoker   . Smokeless tobacco: Never Used  . Alcohol Use: No   OB History   Grav Para Term Preterm  Abortions TAB SAB Ect Mult Living                 Review of Systems  Unable to perform ROS: Dementia  Constitutional: Positive for fever.      Allergies  Sulfa antibiotics and Sulfur  Home Medications   Prior to Admission medications   Medication Sig Start Date End Date Taking? Authorizing Provider  ciprofloxacin (CIPRO) 250 MG tablet Take 250 mg by mouth daily.   Yes Historical Provider, MD  donepezil (ARICEPT) 10 MG tablet Take 10 mg by mouth at bedtime.   Yes Historical Provider, MD  Memantine HCl ER (NAMENDA XR)  28 MG CP24 Take 28 mg by mouth daily.   Yes Historical Provider, MD  multivitamin (RENA-VIT) TABS tablet Take 1 tablet by mouth daily.   Yes Historical Provider, MD  sevelamer carbonate (RENVELA) 800 MG tablet Take 800 mg by mouth 3 (three) times daily with meals.   Yes Historical Provider, MD  torsemide (DEMADEX) 20 MG tablet Take 20 mg by mouth daily.   Yes Historical Provider, MD   BP 141/78  Pulse 109  Temp(Src) 101.2 F (38.4 C) (Rectal)  Resp 16  Ht 5\' 3"  (1.6 m)  Wt 128 lb (58.06 kg)  BMI 22.68 kg/m2  SpO2 93% Physical Exam  Nursing note and vitals reviewed. Constitutional: She is oriented to person, place, and time. She appears well-developed and well-nourished. No distress.  HENT:  Head: Normocephalic and atraumatic.  Mouth/Throat: Oropharynx is clear and moist. No oropharyngeal exudate.  Eyes: Conjunctivae and EOM are normal. Pupils are equal, round, and reactive to light.  Neck: Normal range of motion. Neck supple.  No meningismus.  Cardiovascular: Normal rate, regular rhythm, normal heart sounds and intact distal pulses.   No murmur heard. Pulmonary/Chest: Effort normal and breath sounds normal. No respiratory distress.  Abdominal: Soft. There is no tenderness. There is no rebound and no guarding.  Musculoskeletal: Normal range of motion. She exhibits edema. She exhibits no tenderness.  Left upper extremity AV fistula thrill and bruit +1 pitting edema bilaterally  Neurological: She is alert and oriented to person, place, and time. No cranial nerve deficit. She exhibits normal muscle tone. Coordination normal.  No ataxia on finger to nose bilaterally. No pronator drift. 5/5 strength throughout. CN 2-12 intact. Negative Romberg. Equal grip strength. Sensation intact. Gait is normal.   Skin: Skin is warm.  Psychiatric: She has a normal mood and affect. Her behavior is normal.    ED Course  Procedures (including critical care time) Labs Review Labs Reviewed  CBC WITH  DIFFERENTIAL - Abnormal; Notable for the following:    RBC 3.52 (*)    Hemoglobin 10.6 (*)    HCT 32.8 (*)    RDW 17.0 (*)    Platelets 107 (*)    All other components within normal limits  COMPREHENSIVE METABOLIC PANEL - Abnormal; Notable for the following:    Sodium 134 (*)    Chloride 95 (*)    BUN 25 (*)    Creatinine, Ser 3.22 (*)    Albumin 2.8 (*)    GFR calc non Af Amer 13 (*)    GFR calc Af Amer 14 (*)    All other components within normal limits  CULTURE, BLOOD (ROUTINE X 2)  CULTURE, BLOOD (ROUTINE X 2)  URINE CULTURE  LIPASE, BLOOD  TROPONIN I  LACTIC ACID, PLASMA  URINALYSIS, ROUTINE W REFLEX MICROSCOPIC    Imaging Review No results found.  EKG Interpretation None      MDM   Final diagnoses:  None   dialysis patient with fever and generalized weakness. No cough, congestion, sore throat or runny nose. No chest pain or SOB. Recent treatment for UTI. Nontoxic appearing. Fever to 101.2 in the ED. WBC normal.  electrolytes normal.  CXR and UA pending at sign out.  Anticipate admission for antibiotics.  Care assumed by Dr. Wilkie Aye at shift change.    Glynn Octave, MD 06/07/14 [phone]

## 2014-06-07 NOTE — ED Provider Notes (Signed)
SIgned out from Dr. Manus Gunningancour.  Urinalysis and CXR pending.  Patient presents with fever. Currently on dialysis and been treated for a UTI. Started on Cipro on the 15th. Temp 100.9 today. Generalized weakness.  Urinalysis with too numerous to count white cells and many bacteria. No obvious signs of sepsis. Last dialyzed today. Reviewed patient's prior cultures. She last grew out Klebsiella pneumonia susceptible to Zosyn. Will place patient on Zosyn and admit to the hospitalist.  Shon Batonourtney F Shannen Vernon, MD 06/07/14 2306

## 2014-06-07 NOTE — ED Notes (Addendum)
Started low grade fever last Wednesday. Hx recurrent (montly) UTI. Called PCP. Started Cipro on 06/02/14, when patients son went to pick up Cipro he was informed by [facility], Tammy Hall that the dosage prescribed was twice the dose a renal failure should be taking and she instructed him to cut the dose in half. 100.9 temp today patient c/o of "not feeling good" and extreme weakness. Patients baseline is walking around without assistance. Hemodialysis patient received HD today.

## 2014-06-08 DIAGNOSIS — R509 Fever, unspecified: Secondary | ICD-10-CM | POA: Diagnosis present

## 2014-06-08 DIAGNOSIS — I120 Hypertensive chronic kidney disease with stage 5 chronic kidney disease or end stage renal disease: Secondary | ICD-10-CM | POA: Diagnosis not present

## 2014-06-08 DIAGNOSIS — N186 End stage renal disease: Secondary | ICD-10-CM | POA: Diagnosis not present

## 2014-06-08 DIAGNOSIS — T827XXA Infection and inflammatory reaction due to other cardiac and vascular devices, implants and grafts, initial encounter: Secondary | ICD-10-CM | POA: Diagnosis not present

## 2014-06-08 DIAGNOSIS — N2581 Secondary hyperparathyroidism of renal origin: Secondary | ICD-10-CM | POA: Diagnosis not present

## 2014-06-08 DIAGNOSIS — F0390 Unspecified dementia without behavioral disturbance: Secondary | ICD-10-CM | POA: Diagnosis present

## 2014-06-08 DIAGNOSIS — Z992 Dependence on renal dialysis: Secondary | ICD-10-CM | POA: Diagnosis not present

## 2014-06-08 DIAGNOSIS — Z9981 Dependence on supplemental oxygen: Secondary | ICD-10-CM | POA: Diagnosis not present

## 2014-06-08 DIAGNOSIS — N390 Urinary tract infection, site not specified: Secondary | ICD-10-CM | POA: Diagnosis present

## 2014-06-08 DIAGNOSIS — Y841 Kidney dialysis as the cause of abnormal reaction of the patient, or of later complication, without mention of misadventure at the time of the procedure: Secondary | ICD-10-CM | POA: Diagnosis not present

## 2014-06-08 DIAGNOSIS — D631 Anemia in chronic kidney disease: Secondary | ICD-10-CM | POA: Diagnosis not present

## 2014-06-08 DIAGNOSIS — D638 Anemia in other chronic diseases classified elsewhere: Secondary | ICD-10-CM | POA: Diagnosis not present

## 2014-06-08 DIAGNOSIS — I509 Heart failure, unspecified: Secondary | ICD-10-CM | POA: Diagnosis not present

## 2014-06-08 LAB — CBC
HCT: 30.8 % — ABNORMAL LOW (ref 36.0–46.0)
Hemoglobin: 9.8 g/dL — ABNORMAL LOW (ref 12.0–15.0)
MCH: 30.1 pg (ref 26.0–34.0)
MCHC: 31.8 g/dL (ref 30.0–36.0)
MCV: 94.5 fL (ref 78.0–100.0)
PLATELETS: 96 10*3/uL — AB (ref 150–400)
RBC: 3.26 MIL/uL — ABNORMAL LOW (ref 3.87–5.11)
RDW: 17 % — AB (ref 11.5–15.5)
WBC: 3.6 10*3/uL — ABNORMAL LOW (ref 4.0–10.5)

## 2014-06-08 LAB — COMPREHENSIVE METABOLIC PANEL
ALT: 5 U/L (ref 0–35)
AST: 12 U/L (ref 0–37)
Albumin: 2.4 g/dL — ABNORMAL LOW (ref 3.5–5.2)
Alkaline Phosphatase: 76 U/L (ref 39–117)
Anion gap: 10 (ref 5–15)
BUN: 29 mg/dL — AB (ref 6–23)
CHLORIDE: 98 meq/L (ref 96–112)
CO2: 29 mEq/L (ref 19–32)
CREATININE: 3.9 mg/dL — AB (ref 0.50–1.10)
Calcium: 8.1 mg/dL — ABNORMAL LOW (ref 8.4–10.5)
GFR calc non Af Amer: 10 mL/min — ABNORMAL LOW (ref >90)
GFR, EST AFRICAN AMERICAN: 11 mL/min — AB (ref >90)
Glucose, Bld: 83 mg/dL (ref 70–99)
Potassium: 3.7 mEq/L (ref 3.7–5.3)
Sodium: 137 mEq/L (ref 137–147)
Total Bilirubin: 0.4 mg/dL (ref 0.3–1.2)
Total Protein: 6.4 g/dL (ref 6.0–8.3)

## 2014-06-08 MED ORDER — SODIUM CHLORIDE 0.9 % IV SOLN: 100.0000 mL | INTRAVENOUS | Status: DC | PRN

## 2014-06-08 MED ORDER — HEPARIN SODIUM (PORCINE) 5000 UNIT/ML IJ SOLN
5000.0000 [IU] | Freq: Three times a day (TID) | INTRAMUSCULAR | Status: DC
  Administered 2014-06-08 – 2014-06-09 (×2): 5000 [IU] via SUBCUTANEOUS
  Filled 2014-06-08 (×2): qty 1

## 2014-06-08 MED ORDER — HEPARIN SODIUM (PORCINE) 5000 UNIT/ML IJ SOLN: 5000.0000 [IU] | Freq: Three times a day (TID) | INTRAMUSCULAR | Status: DC

## 2014-06-08 MED ORDER — ONDANSETRON HCL 4 MG/2ML IJ SOLN: 4.0000 mg | Freq: Four times a day (QID) | INTRAMUSCULAR | Status: DC | PRN

## 2014-06-08 MED ORDER — NEPRO/CARBSTEADY PO LIQD: 237.0000 mL | ORAL | Status: DC | PRN

## 2014-06-08 MED ORDER — ALPRAZOLAM 0.5 MG PO TABS
0.5000 mg | ORAL_TABLET | Freq: Once | ORAL | Status: AC
  Administered 2014-06-08: 0.5 mg via ORAL
  Filled 2014-06-08: qty 1

## 2014-06-08 MED ORDER — PIPERACILLIN-TAZOBACTAM IN DEX 2-0.25 GM/50ML IV SOLN
2.2500 g | Freq: Two times a day (BID) | INTRAVENOUS | Status: DC
  Filled 2014-06-08 (×2): qty 50

## 2014-06-08 MED ORDER — MEMANTINE HCL ER 28 MG PO CP24
28.0000 mg | ORAL_CAPSULE | Freq: Every day | ORAL | Status: DC
  Administered 2014-06-08 – 2014-06-09 (×2): 28 mg via ORAL
  Filled 2014-06-08 (×4): qty 28

## 2014-06-08 MED ORDER — PENTAFLUOROPROP-TETRAFLUOROETH EX AERO
1.0000 "application " | INHALATION_SPRAY | CUTANEOUS | Status: DC | PRN
  Filled 2014-06-08: qty 103.5

## 2014-06-08 MED ORDER — LIDOCAINE HCL (PF) 1 % IJ SOLN: 5.0000 mL | INTRAMUSCULAR | Status: DC | PRN

## 2014-06-08 MED ORDER — TORSEMIDE 20 MG PO TABS
20.0000 mg | ORAL_TABLET | Freq: Every day | ORAL | Status: DC
  Administered 2014-06-08 – 2014-06-09 (×2): 20 mg via ORAL
  Filled 2014-06-08 (×4): qty 1

## 2014-06-08 MED ORDER — LIDOCAINE-PRILOCAINE 2.5-2.5 % EX CREA: 1.0000 "application " | TOPICAL_CREAM | CUTANEOUS | Status: DC | PRN

## 2014-06-08 MED ORDER — SODIUM CHLORIDE 0.9 % IJ SOLN
3.0000 mL | Freq: Two times a day (BID) | INTRAMUSCULAR | Status: DC
  Administered 2014-06-08: 3 mL via INTRAVENOUS

## 2014-06-08 MED ORDER — SEVELAMER CARBONATE 800 MG PO TABS
800.0000 mg | ORAL_TABLET | Freq: Three times a day (TID) | ORAL | Status: DC
  Administered 2014-06-08 – 2014-06-09 (×5): 800 mg via ORAL
  Filled 2014-06-08 (×10): qty 1

## 2014-06-08 MED ORDER — HEPARIN SODIUM (PORCINE) 1000 UNIT/ML DIALYSIS
1000.0000 [IU] | INTRAMUSCULAR | Status: DC | PRN
  Filled 2014-06-08: qty 1

## 2014-06-08 MED ORDER — ONDANSETRON HCL 4 MG PO TABS: 4.0000 mg | ORAL_TABLET | Freq: Four times a day (QID) | ORAL | Status: DC | PRN

## 2014-06-08 MED ORDER — EPOETIN ALFA 10000 UNIT/ML IJ SOLN
10000.0000 [IU] | INTRAMUSCULAR | Status: DC
  Administered 2014-06-08: 10000 [IU] via INTRAVENOUS
  Filled 2014-06-08: qty 1

## 2014-06-08 MED ORDER — PIPERACILLIN-TAZOBACTAM IN DEX 2-0.25 GM/50ML IV SOLN
2.2500 g | Freq: Three times a day (TID) | INTRAVENOUS | Status: DC
  Administered 2014-06-08 – 2014-06-09 (×4): 2.25 g via INTRAVENOUS
  Filled 2014-06-08 (×7): qty 50

## 2014-06-08 MED ORDER — ALTEPLASE 2 MG IJ SOLR
2.0000 mg | Freq: Once | INTRAMUSCULAR | Status: AC | PRN
  Filled 2014-06-08: qty 2

## 2014-06-08 MED ORDER — RENA-VITE PO TABS
1.0000 | ORAL_TABLET | Freq: Every day | ORAL | Status: DC
  Administered 2014-06-08 – 2014-06-09 (×2): 1 via ORAL
  Filled 2014-06-08 (×4): qty 1

## 2014-06-08 MED ORDER — DONEPEZIL HCL 5 MG PO TABS
10.0000 mg | ORAL_TABLET | Freq: Every day | ORAL | Status: DC
  Administered 2014-06-08: 10 mg via ORAL
  Filled 2014-06-08: qty 2
  Filled 2014-06-08 (×2): qty 1

## 2014-06-08 NOTE — Progress Notes (Signed)
Subjective: She was admitted last night with urinary tract infection. She looks better. She's awake and she knows she is in the hospital  Objective: Vital signs in last 24 hours: Temp:  [97.5 F (36.4 C)-101.2 F (38.4 C)] 97.5 F (36.4 C) (07/21 0446) Pulse Rate:  [65-109] 65 (07/21 0204) Resp:  [16-32] 18 (07/21 0446) BP: (100-141)/(45-78) 106/52 mmHg (07/21 0446) SpO2:  [93 %-100 %] 97 % (07/21 0446) Weight:  [56 kg (123 lb 7.3 oz)-58.06 kg (128 lb)] 56 kg (123 lb 7.3 oz) (07/21 0204) Weight change:  Last BM Date: 06/07/14  Intake/Output from previous day: 07/20 0701 - 07/21 0700 In: -  Out: 10 [Urine:10]  PHYSICAL EXAM General appearance: alert, cooperative and mild distress Resp: clear to auscultation bilaterally Cardio: regular rate and rhythm, S1, S2 normal, no murmur, click, rub or gallop GI: soft, non-tender; bowel sounds normal; no masses,  no organomegaly Extremities: extremities normal, atraumatic, no cyanosis or edema  Lab Results:  Results for orders placed during the hospital encounter of 06/07/14 (from the past 48 hour(s))  CBC WITH DIFFERENTIAL     Status: Abnormal   Collection Time    06/07/14  9:00 PM      Result Value Ref Range   WBC 4.9  4.0 - 10.5 K/uL   RBC 3.52 (*) 3.87 - 5.11 MIL/uL   Hemoglobin 10.6 (*) 12.0 - 15.0 g/dL   HCT 32.8 (*) 36.0 - 46.0 %   MCV 93.2  78.0 - 100.0 fL   MCH 30.1  26.0 - 34.0 pg   MCHC 32.3  30.0 - 36.0 g/dL   RDW 17.0 (*) 11.5 - 15.5 %   Platelets 107 (*) 150 - 400 K/uL   Comment: SPECIMEN CHECKED FOR CLOTS     PLATELET COUNT CONFIRMED BY SMEAR   Neutrophils Relative % 75  43 - 77 %   Neutro Abs 3.7  1.7 - 7.7 K/uL   Lymphocytes Relative 14  12 - 46 %   Lymphs Abs 0.7  0.7 - 4.0 K/uL   Monocytes Relative 6  3 - 12 %   Monocytes Absolute 0.3  0.1 - 1.0 K/uL   Eosinophils Relative 5  0 - 5 %   Eosinophils Absolute 0.2  0.0 - 0.7 K/uL   Basophils Relative 1  0 - 1 %   Basophils Absolute 0.0  0.0 - 0.1 K/uL   COMPREHENSIVE METABOLIC PANEL     Status: Abnormal   Collection Time    06/07/14  9:00 PM      Result Value Ref Range   Sodium 134 (*) 137 - 147 mEq/L   Potassium 3.8  3.7 - 5.3 mEq/L   Chloride 95 (*) 96 - 112 mEq/L   CO2 27  19 - 32 mEq/L   Glucose, Bld 92  70 - 99 mg/dL   BUN 25 (*) 6 - 23 mg/dL   Creatinine, Ser 3.22 (*) 0.50 - 1.10 mg/dL   Calcium 8.4  8.4 - 10.5 mg/dL   Total Protein 7.3  6.0 - 8.3 g/dL   Albumin 2.8 (*) 3.5 - 5.2 g/dL   AST 13  0 - 37 U/L   ALT 6  0 - 35 U/L   Alkaline Phosphatase 92  39 - 117 U/L   Total Bilirubin 0.4  0.3 - 1.2 mg/dL   GFR calc non Af Amer 13 (*) >90 mL/min   GFR calc Af Amer 14 (*) >90 mL/min   Comment: (NOTE)  The eGFR has been calculated using the CKD EPI equation.     This calculation has not been validated in all clinical situations.     eGFR's persistently <90 mL/min signify possible Chronic Kidney     Disease.   Anion gap 12  5 - 15  LIPASE, BLOOD     Status: None   Collection Time    06/07/14  9:00 PM      Result Value Ref Range   Lipase 58  11 - 59 U/L  TROPONIN I     Status: None   Collection Time    06/07/14  9:00 PM      Result Value Ref Range   Troponin I <0.30  <0.30 ng/mL   Comment:            Due to the release kinetics of cTnI,     a negative result within the first hours     of the onset of symptoms does not rule out     myocardial infarction with certainty.     If myocardial infarction is still suspected,     repeat the test at appropriate intervals.  CULTURE, BLOOD (ROUTINE X 2)     Status: None   Collection Time    06/07/14  9:00 PM      Result Value Ref Range   Specimen Description BLOOD BLOOD RIGHT WRIST     Special Requests BOTTLES DRAWN AEROBIC AND ANAEROBIC [place]     Culture PENDING     Report Status PENDING    LACTIC ACID, PLASMA     Status: None   Collection Time    06/07/14  9:00 PM      Result Value Ref Range   Lactic Acid, Venous 1.6  0.5 - 2.2 mmol/L  URINALYSIS, ROUTINE W REFLEX  MICROSCOPIC     Status: Abnormal   Collection Time    06/07/14  9:57 PM      Result Value Ref Range   Color, Urine YELLOW  YELLOW   APPearance CLOUDY (*) CLEAR   Specific Gravity, Urine 1.020  1.005 - 1.030   pH 7.0  5.0 - 8.0   Glucose, UA NEGATIVE  NEGATIVE mg/dL   Hgb urine dipstick LARGE (*) NEGATIVE   Bilirubin Urine SMALL (*) NEGATIVE   Ketones, ur NEGATIVE  NEGATIVE mg/dL   Protein, ur >300 (*) NEGATIVE mg/dL   Urobilinogen, UA 0.2  0.0 - 1.0 mg/dL   Nitrite NEGATIVE  NEGATIVE   Leukocytes, UA LARGE (*) NEGATIVE  URINE MICROSCOPIC-ADD ON     Status: Abnormal   Collection Time    06/07/14  9:57 PM      Result Value Ref Range   Squamous Epithelial / LPF RARE  RARE   WBC, UA TOO NUMEROUS TO COUNT  <3 WBC/hpf   RBC / HPF 7-10  <3 RBC/hpf   Bacteria, UA MANY (*) RARE  CULTURE, BLOOD (ROUTINE X 2)     Status: None   Collection Time    06/07/14 10:02 PM      Result Value Ref Range   Specimen Description Blood BLOOD RIGHT ARM     Special Requests BOTTLES DRAWN AEROBIC AND ANAEROBIC [place]     Culture PENDING     Report Status PENDING    COMPREHENSIVE METABOLIC PANEL     Status: Abnormal   Collection Time    06/08/14  5:37 AM      Result Value Ref Range   Sodium 137  137 -  147 mEq/L   Potassium 3.7  3.7 - 5.3 mEq/L   Chloride 98  96 - 112 mEq/L   CO2 29  19 - 32 mEq/L   Glucose, Bld 83  70 - 99 mg/dL   BUN 29 (*) 6 - 23 mg/dL   Creatinine, Ser 3.90 (*) 0.50 - 1.10 mg/dL   Calcium 8.1 (*) 8.4 - 10.5 mg/dL   Total Protein 6.4  6.0 - 8.3 g/dL   Albumin 2.4 (*) 3.5 - 5.2 g/dL   AST 12  0 - 37 U/L   ALT 5  0 - 35 U/L   Alkaline Phosphatase 76  39 - 117 U/L   Total Bilirubin 0.4  0.3 - 1.2 mg/dL   GFR calc non Af Amer 10 (*) >90 mL/min   GFR calc Af Amer 11 (*) >90 mL/min   Comment: (NOTE)     The eGFR has been calculated using the CKD EPI equation.     This calculation has not been validated in all clinical situations.     eGFR's persistently <90 mL/min signify possible  Chronic Kidney     Disease.   Anion gap 10  5 - 15  CBC     Status: Abnormal   Collection Time    06/08/14  5:37 AM      Result Value Ref Range   WBC 3.6 (*) 4.0 - 10.5 K/uL   RBC 3.26 (*) 3.87 - 5.11 MIL/uL   Hemoglobin 9.8 (*) 12.0 - 15.0 g/dL   HCT 30.8 (*) 36.0 - 46.0 %   MCV 94.5  78.0 - 100.0 fL   MCH 30.1  26.0 - 34.0 pg   MCHC 31.8  30.0 - 36.0 g/dL   RDW 17.0 (*) 11.5 - 15.5 %   Platelets 96 (*) 150 - 400 K/uL   Comment: PLATELET COUNT CONFIRMED BY SMEAR     SPECIMEN CHECKED FOR CLOTS    ABGS No results found for this basename: PHART, PCO2, PO2ART, TCO2, HCO3,  in the last 72 hours CULTURES Recent Results (from the past 240 hour(s))  CULTURE, BLOOD (ROUTINE X 2)     Status: None   Collection Time    06/07/14  9:00 PM      Result Value Ref Range Status   Specimen Description BLOOD BLOOD RIGHT WRIST   Final   Special Requests BOTTLES DRAWN AEROBIC AND ANAEROBIC [place]   Final   Culture PENDING   Incomplete   Report Status PENDING   Incomplete  CULTURE, BLOOD (ROUTINE X 2)     Status: None   Collection Time    06/07/14 10:02 PM      Result Value Ref Range Status   Specimen Description Blood BLOOD RIGHT ARM   Final   Special Requests BOTTLES DRAWN AEROBIC AND ANAEROBIC [facility]   Final   Culture PENDING   Incomplete   Report Status PENDING   Incomplete   Studies/Results: Dg Chest 2 View  06/07/2014   CLINICAL DATA:  Fever today. History of dementia, of pneumonia and renal disorder of dialysis.  EXAM: CHEST  2 VIEW  COMPARISON:  04/24/2014 and 04/02/2014.  FINDINGS: 2217 hr. Left IJ dialysis catheter and left subclavian stent are grossly stable. There is cardiomegaly with aortic atherosclerosis. Pulmonary edema has worsened. There is chronic blunting of the left costophrenic angle which appears unchanged. No new airspace opacity is identified.  IMPRESSION: Worsening pulmonary edema consistent with congestive heart failure or volume overload. Stable blunting of the left  costophrenic angle consistent with a chronic pleural effusion or pleural thickening.   Electronically Signed   By: Camie Patience M.D.   On: 06/07/2014 23:09    Medications:  Prior to Admission:  Prescriptions prior to admission  Medication Sig Dispense Refill  . ciprofloxacin (CIPRO) 250 MG tablet Take 250 mg by mouth daily.      [name] donepezil (ARICEPT) 10 MG tablet Take 10 mg by mouth at bedtime.      . Memantine HCl ER (NAMENDA XR) 28 MG CP24 Take 28 mg by mouth daily.      . multivitamin (RENA-VIT) TABS tablet Take 1 tablet by mouth daily.      . sevelamer carbonate (RENVELA) 800 MG tablet Take 800 mg by mouth 3 (three) times daily with meals.      . torsemide (DEMADEX) 20 MG tablet Take 20 mg by mouth daily.       Scheduled: . donepezil  10 mg Oral QHS  . heparin  5,000 Units Subcutaneous 3 times per day  . Memantine HCl ER  28 mg Oral Daily  . multivitamin  1 tablet Oral Daily  . piperacillin-tazobactam (ZOSYN)  IV  2.25 g Intravenous Q12H  . sevelamer carbonate  800 mg Oral TID WC  . sodium chloride  3 mL Intravenous Q12H  . torsemide  20 mg Oral Daily   Continuous:  HBZ:JIRCVELFYBO (ZOFRAN) IV, ondansetron  Assesment: She was admitted with urinary tract infection. She has dementia which is moderately severe. She is chronic kidney disease and is on dialysis. She is better. Active Problems:   CKD (chronic kidney disease) stage V requiring chronic dialysis   Volume overload   UTI (lower urinary tract infection)   Anemia of chronic disease   Dementia   Urinary tract infection    Plan: Continue current treatments    LOS: 1 day   Asiya Cutbirth L 06/08/2014, 9:00 AM

## 2014-06-08 NOTE — Progress Notes (Signed)
ANTIBIOTIC CONSULT NOTE - follow up  Pharmacy Consult for Zosyn Indication: UTI in pt with ESRD (dialysis)   Allergies  Allergen Reactions  . Sulfa Antibiotics Nausea And Vomiting and Rash  . Sulfur Nausea And Vomiting and Rash   Patient Measurements: Height: 5\' 3"  (160 cm) Weight: 123 lb 7.3 oz (56 kg) IBW/kg (Calculated) : 52.4  Vital Signs: Temp: 97.5 F (36.4 C) (07/21 0446) Temp src: Oral (07/21 0446) BP: 106/52 mmHg (07/21 0446) Pulse Rate: 65 (07/21 0204) Intake/Output from previous day: 07/20 0701 - 07/21 0700 In: -  Out: 10 [Urine:10] Intake/Output from this shift: Total I/O In: 120 [P.O.:120] Out: -   Labs:  Recent Labs  06/07/14 2100 06/08/14 0537  WBC 4.9 3.6*  HGB 10.6* 9.8*  PLT 107* 96*  CREATININE 3.22* 3.90*   Estimated Creatinine Clearance: 9.4 ml/min (by C-G formula based on Cr of 3.9). No results found for this basename: VANCOTROUGH, Leodis BinetVANCOPEAK, VANCORANDOM, GENTTROUGH, GENTPEAK, GENTRANDOM, TOBRATROUGH, TOBRAPEAK, TOBRARND, AMIKACINPEAK, AMIKACINTROU, AMIKACIN,  in the last 72 hours   Microbiology: Recent Results (from the past 720 hour(s))  CULTURE, BLOOD (ROUTINE X 2)     Status: None   Collection Time    06/07/14  9:00 PM      Result Value Ref Range Status   Specimen Description BLOOD BLOOD RIGHT WRIST   Final   Special Requests BOTTLES DRAWN AEROBIC AND ANAEROBIC [facility]   Final   Culture NO GROWTH 1 DAY   Final   Report Status PENDING   Incomplete  CULTURE, BLOOD (ROUTINE X 2)     Status: None   Collection Time    06/07/14 10:02 PM      Result Value Ref Range Status   Specimen Description Blood BLOOD RIGHT ARM   Final   Special Requests BOTTLES DRAWN AEROBIC AND ANAEROBIC [facility]   Final   Culture NO GROWTH 1 DAY   Final   Report Status PENDING   Incomplete   Medical History: Past Medical History  Diagnosis Date  . Shortness of breath     with exertion  . Pneumonia 05/2013  . Mental disorder     dementia - short term memory  .  History of blood transfusion     after fistula ruptured  . Dementia   . Renal disorder     dialysis MWF   Medications:  Scheduled:  . donepezil  10 mg Oral QHS  . epoetin alfa  10,000 Units Intravenous Q T,Th,Sa-HD  . heparin  5,000 Units Subcutaneous 3 times per day  . Memantine HCl ER  28 mg Oral Daily  . multivitamin  1 tablet Oral Daily  . piperacillin-tazobactam (ZOSYN)  IV  2.25 g Intravenous 3 times per day  . sevelamer carbonate  800 mg Oral TID WC  . sodium chloride  3 mL Intravenous Q12H  . torsemide  20 mg Oral Daily   Infusions:    PRN: ondansetron (ZOFRAN) IV, ondansetron  Assessment: 80yo female demented patient who failed oral Cipro tx for UTI. Pt is also ESRD on hemodialysis  Goal of Therapy:  Eradicate infection.  Plan:  Zosyn 2.25gm IV q8h Monitor progress and cultures Deescalate abx / switch to PO Rx when appropriate  Margo AyeHall, Gale Hulse A 06/08/2014,10:13 AM

## 2014-06-08 NOTE — Consult Note (Signed)
Reason for Consult: End-stage renal disease Referring Physician: Dr. Gabriel Rung [name] is an 80 y.o. female.  HPI:  She is patient who has history of severe dementia, end-stage renal disease on hemodialysis presently was brought because of fever. Patient was dialyzed yesterday. Patient had history of fever and thought to be secondary to urine tract infection and started on antibiotics as an outpatient. However patient continued to have fever hence admitted for further management. Presently patient offers no complaints.  Past Medical History  Diagnosis Date  . Shortness of breath     with exertion  . Pneumonia 05/2013  . Mental disorder     dementia - short term memory  . History of blood transfusion     after fistula ruptured  . Dementia   . Renal disorder     dialysis MWF    Past Surgical History  Procedure Laterality Date  . Dialysis fistula creation    . Ligation of arteriovenous  fistula Left 06/04/2013    Procedure: LIGATION OF ARTERIOVENOUS  FISTULA;  Surgeon: Conrad Mabie, MD;  Location: [place];  Service: Vascular;  Laterality: Left;  . Insertion of dialysis catheter Right 06/04/2013    Procedure: INSERTION OF DIALYSIS CATHETER;  Surgeon: Conrad Lorimor, MD;  Location: [place];  Service: Vascular;  Laterality: Right;  . Cholecystectomy  1992  . Av fistula placement Right 07/07/2013    Procedure: ARTERIOVENOUS (AV) FISTULA CREATION - RIGHT BRACHIAL CEPHALIC ;  Surgeon: Conrad Newark, MD;  Location: [place];  Service: Vascular;  Laterality: Right;  . Removal of a dialysis catheter Right 11/09/2013    Procedure: REMOVAL OF A DIALYSIS CATHETER- Right TDC ;  Surgeon: Conrad Huntingdon, MD;  Location: [place];  Service: Vascular;  Laterality: Right;  . Insertion of dialysis catheter Left 11/09/2013    Procedure: INSERTION OF DIALYSIS CATHETER- Left TDC;  Surgeon: Conrad [place], MD;  Location: [place];  Service: Vascular;  Laterality: Left;  . Exchange of a dialysis catheter Left 12/17/2013     Procedure: EXCHANGE OF A DIALYSIS CATHETER;  Surgeon: Angelia Mould, MD;  Location: [place];  Service: Vascular;  Laterality: Left;  . Fistulogram Right 12/17/2013    Procedure: FISTULOGRAM- RIGHT ARM- POSSIBLE INTERVENTION;  Surgeon: Angelia Mould, MD;  Location: [place];  Service: Vascular;  Laterality: Right;  . Ligation of competing branches of arteriovenous fistula Right 12/17/2013    Procedure: LIGATION OF COMPETING BRANCHES OF ARTERIOVENOUS FISTULA;  Surgeon: Angelia Mould, MD;  Location: [place];  Service: Vascular;  Laterality: Right;  . Ligation of arteriovenous  fistula Right 01/19/2014    Procedure: LIGATION OF ARTERIOVENOUS  FISTULA- RIGHT BRACHIOCEPHALIC FISTULA;  Surgeon: Conrad , MD;  Location: [place];  Service: Vascular;  Laterality: Right;    History reviewed. No pertinent family history.  Social History:  reports that she has never smoked. She has never used smokeless tobacco. She reports that she does not drink alcohol or use illicit drugs.  Allergies:  Allergies  Allergen Reactions  . Sulfa Antibiotics Nausea And Vomiting and Rash  . Sulfur Nausea And Vomiting and Rash    Medications: Deanna have reviewed the patient'[name] current medications.  Results for orders placed during the hospital encounter of 06/07/14 (from the past 48 hour([name]))  CBC WITH DIFFERENTIAL     Status: Abnormal   Collection Time    06/07/14  9:00 PM      Result Value Ref Range  WBC 4.9  4.0 - 10.5 K/uL   RBC 3.52 (*) 3.87 - 5.11 MIL/uL   Hemoglobin 10.6 (*) 12.0 - 15.0 g/dL   HCT 32.8 (*) 36.0 - 46.0 %   MCV 93.2  78.0 - 100.0 fL   MCH 30.1  26.0 - 34.0 pg   MCHC 32.3  30.0 - 36.0 g/dL   RDW 17.0 (*) 11.5 - 15.5 %   Platelets 107 (*) 150 - 400 K/uL   Comment: SPECIMEN CHECKED FOR CLOTS     PLATELET COUNT CONFIRMED BY SMEAR   Neutrophils Relative % 75  43 - 77 %   Neutro Abs 3.7  1.7 - 7.7 K/uL   Lymphocytes Relative 14  12 - 46 %   Lymphs Abs 0.7  0.7 - 4.0 K/uL   Monocytes  Relative 6  3 - 12 %   Monocytes Absolute 0.3  0.1 - 1.0 K/uL   Eosinophils Relative 5  0 - 5 %   Eosinophils Absolute 0.2  0.0 - 0.7 K/uL   Basophils Relative 1  0 - 1 %   Basophils Absolute 0.0  0.0 - 0.1 K/uL  COMPREHENSIVE METABOLIC PANEL     Status: Abnormal   Collection Time    06/07/14  9:00 PM      Result Value Ref Range   Sodium 134 (*) 137 - 147 mEq/L   Potassium 3.8  3.7 - 5.3 mEq/L   Chloride 95 (*) 96 - 112 mEq/L   CO2 27  19 - 32 mEq/L   Glucose, Bld 92  70 - 99 mg/dL   BUN 25 (*) 6 - 23 mg/dL   Creatinine, Ser 3.22 (*) 0.50 - 1.10 mg/dL   Calcium 8.4  8.4 - 10.5 mg/dL   Total Protein 7.3  6.0 - 8.3 g/dL   Albumin 2.8 (*) 3.5 - 5.2 g/dL   AST 13  0 - 37 U/L   ALT 6  0 - 35 U/L   Alkaline Phosphatase 92  39 - 117 U/L   Total Bilirubin 0.4  0.3 - 1.2 mg/dL   GFR calc non Af Amer 13 (*) >90 mL/min   GFR calc Af Amer 14 (*) >90 mL/min   Comment: (NOTE)     The eGFR has been calculated using the CKD EPI equation.     This calculation has not been validated in all clinical situations.     eGFR'[name] persistently <90 mL/min signify possible Chronic Kidney     Disease.   Anion gap 12  5 - 15  LIPASE, BLOOD     Status: None   Collection Time    06/07/14  9:00 PM      Result Value Ref Range   Lipase 58  11 - 59 U/L  TROPONIN Deanna     Status: None   Collection Time    06/07/14  9:00 PM      Result Value Ref Range   Troponin Deanna <0.30  <0.30 ng/mL   Comment:            Due to the release kinetics of cTnI,     a negative result within the first hours     of the onset of symptoms does not rule out     myocardial infarction with certainty.     If myocardial infarction is still suspected,     repeat the test at appropriate intervals.  CULTURE, BLOOD (ROUTINE X 2)     Status: None   Collection  Time    06/07/14  9:00 PM      Result Value Ref Range   Specimen Description BLOOD BLOOD RIGHT WRIST     Special Requests BOTTLES DRAWN AEROBIC AND ANAEROBIC [place]     Culture PENDING      Report Status PENDING    LACTIC ACID, PLASMA     Status: None   Collection Time    06/07/14  9:00 PM      Result Value Ref Range   Lactic Acid, Venous 1.6  0.5 - 2.2 mmol/L  URINALYSIS, ROUTINE W REFLEX MICROSCOPIC     Status: Abnormal   Collection Time    06/07/14  9:57 PM      Result Value Ref Range   Color, Urine YELLOW  YELLOW   APPearance CLOUDY (*) CLEAR   Specific Gravity, Urine 1.020  1.005 - 1.030   pH 7.0  5.0 - 8.0   Glucose, UA NEGATIVE  NEGATIVE mg/dL   Hgb urine dipstick LARGE (*) NEGATIVE   Bilirubin Urine SMALL (*) NEGATIVE   Ketones, ur NEGATIVE  NEGATIVE mg/dL   Protein, ur >300 (*) NEGATIVE mg/dL   Urobilinogen, UA 0.2  0.0 - 1.0 mg/dL   Nitrite NEGATIVE  NEGATIVE   Leukocytes, UA LARGE (*) NEGATIVE  URINE MICROSCOPIC-ADD ON     Status: Abnormal   Collection Time    06/07/14  9:57 PM      Result Value Ref Range   Squamous Epithelial / LPF RARE  RARE   WBC, UA TOO NUMEROUS TO COUNT  <3 WBC/hpf   RBC / HPF 7-10  <3 RBC/hpf   Bacteria, UA MANY (*) RARE  CULTURE, BLOOD (ROUTINE X 2)     Status: None   Collection Time    06/07/14 10:02 PM      Result Value Ref Range   Specimen Description Blood BLOOD RIGHT ARM     Special Requests BOTTLES DRAWN AEROBIC AND ANAEROBIC [place]     Culture PENDING     Report Status PENDING    COMPREHENSIVE METABOLIC PANEL     Status: Abnormal   Collection Time    06/08/14  5:37 AM      Result Value Ref Range   Sodium 137  137 - 147 mEq/L   Potassium 3.7  3.7 - 5.3 mEq/L   Chloride 98  96 - 112 mEq/L   CO2 29  19 - 32 mEq/L   Glucose, Bld 83  70 - 99 mg/dL   BUN 29 (*) 6 - 23 mg/dL   Creatinine, Ser 3.90 (*) 0.50 - 1.10 mg/dL   Calcium 8.1 (*) 8.4 - 10.5 mg/dL   Total Protein 6.4  6.0 - 8.3 g/dL   Albumin 2.4 (*) 3.5 - 5.2 g/dL   AST 12  0 - 37 U/L   ALT 5  0 - 35 U/L   Alkaline Phosphatase 76  39 - 117 U/L   Total Bilirubin 0.4  0.3 - 1.2 mg/dL   GFR calc non Af Amer 10 (*) >90 mL/min   GFR calc Af Amer 11 (*) >90  mL/min   Comment: (NOTE)     The eGFR has been calculated using the CKD EPI equation.     This calculation has not been validated in all clinical situations.     eGFR'[name] persistently <90 mL/min signify possible Chronic Kidney     Disease.   Anion gap 10  5 - 15  CBC     Status: Abnormal  Collection Time    06/08/14  5:37 AM      Result Value Ref Range   WBC 3.6 (*) 4.0 - 10.5 K/uL   RBC 3.26 (*) 3.87 - 5.11 MIL/uL   Hemoglobin 9.8 (*) 12.0 - 15.0 g/dL   HCT 30.8 (*) 36.0 - 46.0 %   MCV 94.5  78.0 - 100.0 fL   MCH 30.1  26.0 - 34.0 pg   MCHC 31.8  30.0 - 36.0 g/dL   RDW 17.0 (*) 11.5 - 15.5 %   Platelets 96 (*) 150 - 400 K/uL   Comment: PLATELET COUNT CONFIRMED BY SMEAR     SPECIMEN CHECKED FOR CLOTS    Dg Chest 2 View  06/07/2014   CLINICAL DATA:  Fever today. History of dementia, of pneumonia and renal disorder of dialysis.  EXAM: CHEST  2 VIEW  COMPARISON:  04/24/2014 and 04/02/2014.  FINDINGS: 2217 hr. Left IJ dialysis catheter and left subclavian stent are grossly stable. There is cardiomegaly with aortic atherosclerosis. Pulmonary edema has worsened. There is chronic blunting of the left costophrenic angle which appears unchanged. No new airspace opacity is identified.  IMPRESSION: Worsening pulmonary edema consistent with congestive heart failure or volume overload. Stable blunting of the left costophrenic angle consistent with a chronic pleural effusion or pleural thickening.   Electronically Signed   By: Camie Patience M.D.   On: 06/07/2014 23:09    Review of Systems  Constitutional: Positive for fever.  Respiratory: Negative for shortness of breath.   Cardiovascular: Negative for orthopnea and leg swelling.  Gastrointestinal: Negative for nausea and vomiting.   Blood pressure 106/52, pulse 65, temperature 97.5 F (36.4 C), temperature source Oral, resp. rate 18, height '5\' 3"'  (1.6 m), weight 56 kg (123 lb 7.3 oz), SpO2 97.00%. Physical Exam  Constitutional: No distress.   Eyes: No scleral icterus.  Neck: No JVD present.  Cardiovascular: Normal rate and regular rhythm.   Respiratory: No respiratory distress.  GI: She exhibits no distension. There is no tenderness.  Musculoskeletal: She exhibits no edema.    Assessment/Plan:  Problem #1 end-stage renal disease she status post hemodialysis yesterday. Presently patient doesn't have any uremic sinus symptoms. Problem #2 dementia Problem #3 hypertension: Her blood pressure is reasonably controlled Problem #4 urinary tract infection Problem #5 anemia: Her hemoglobin and hematocrit is below target. Problem #6 metabolic bone disease calcium is range. Problem #7 fluid management: Patient is asymptomatic however she seems to have CHF and which seems to be worsening on the x-ray. She has also left pleural effusion. Plan: We'll make arrangements for patient to get dialysis today We'll try to remove about 3 L if her blood pressure tolerates With his Epogen 10,000 units IV after each dialysis We'll check her basic metabolic panel and phosphorus in the morning.   [name] 06/08/2014, 9:10 AM

## 2014-06-08 NOTE — Progress Notes (Signed)
ANTIBIOTIC CONSULT NOTE - INITIAL  Pharmacy Consult for Zosyn Indication: UTI in ESRD pt  Allergies  Allergen Reactions  . Sulfa Antibiotics Nausea And Vomiting and Rash  . Sulfur Nausea And Vomiting and Rash    Patient Measurements: Height: 5\' 3"  (160 cm) Weight: 128 lb (58.06 kg) IBW/kg (Calculated) : 52.4   Vital Signs: Temp: 101.2 F (38.4 C) (07/20 2142) Temp src: Rectal (07/20 2142) BP: 113/61 mmHg (07/21 0100) Pulse Rate: 86 (07/20 2300) Intake/Output from previous day: 07/20 0701 - 07/21 0700 In: -  Out: 10 [Urine:10] Intake/Output from this shift: Total I/O In: -  Out: 10 [Urine:10]  Labs:  Recent Labs  06/07/14 2100  WBC 4.9  HGB 10.6*  PLT 107*  CREATININE 3.22*   Estimated Creatinine Clearance: 11.3 ml/min (by C-G formula based on Cr of 3.22). No results found for this basename: VANCOTROUGH, Leodis BinetVANCOPEAK, VANCORANDOM, GENTTROUGH, GENTPEAK, GENTRANDOM, TOBRATROUGH, TOBRAPEAK, TOBRARND, AMIKACINPEAK, AMIKACINTROU, AMIKACIN,  in the last 72 hours   Microbiology: Recent Results (from the past 720 hour(s))  CULTURE, BLOOD (ROUTINE X 2)     Status: None   Collection Time    06/07/14  9:00 PM      Result Value Ref Range Status   Specimen Description BLOOD BLOOD RIGHT WRIST   Final   Special Requests BOTTLES DRAWN AEROBIC AND ANAEROBIC 7CC   Final   Culture PENDING   Incomplete   Report Status PENDING   Incomplete  CULTURE, BLOOD (ROUTINE X 2)     Status: None   Collection Time    06/07/14 10:02 PM      Result Value Ref Range Status   Specimen Description Blood BLOOD RIGHT ARM   Final   Special Requests BOTTLES DRAWN AEROBIC AND ANAEROBIC [facility]7CC   Final   Culture PENDING   Incomplete   Report Status PENDING   Incomplete    Medical History: Past Medical History  Diagnosis Date  . Shortness of breath     with exertion  . Pneumonia 05/2013  . Mental disorder     dementia - short term memory  . History of blood transfusion     after fistula ruptured   . Dementia   . Renal disorder     dialysis MWF    Medications:  Scheduled:  . donepezil  10 mg Oral QHS  . heparin  5,000 Units Subcutaneous 3 times per day  . Memantine HCl ER  28 mg Oral Daily  . multivitamin  1 tablet Oral Daily  . sevelamer carbonate  800 mg Oral TID WC  . sodium chloride  3 mL Intravenous Q12H  . torsemide  20 mg Oral Daily   Infusions:  . piperacillin-tazobactam (ZOSYN)  IV     PRN: ondansetron (ZOFRAN) IV, ondansetron  Assessment: [age]yr female demented patient who failed oral Cipro tx for UTI.  Pt is also ESRD on hemodialysis  Goal of Therapy:  Follow standard regimen of Zosyn for routine hemodialysis patients  Plan:  1.  Zosyn 2.25gm IV q12h 2.  Monitor indices of infection and renal output  Dorian Renfro E 06/08/2014,1:28 AM

## 2014-06-08 NOTE — Procedures (Signed)
   HEMODIALYSIS TREATMENT NOTE:  3.5 hour heparin-free dialysis completed via left upper arm AVG (16g ante/retrograde).  Goal met:  Tolerated removal of 3 liters without interruption in ultrafiltration.  All blood was reinfused and hemostasis was achieved within 15 minutes.  Report given to Morene Antu, RN.  Juwon Scripter L. Janet Decesare, RN, CDN

## 2014-06-08 NOTE — H&P (Signed)
[name] MRN: [id number] DOB/AGE: 78/11/1930 78 y.o. Primary Care Physician:HAWKINS,EDWARD L, MD Admit date: 06/07/2014 Chief Complaint: Fever. HPI: This is an 80 year old lady who has moderate dementia and end-stage renal disease on hemodialysis who now presents with fever. Approximately 5 days ago, she was started on oral ciprofloxacin for probable UTI. Unfortunately she has not improved and further evaluation in the emergency room shows that she had a fever and urine consistent with UTI. She is now being admitted for further management. The patient herself is unable to give me any clear history due to her dementia and history is obtained from ER medical records and the patient's son at the bedside.  Past Medical History  Diagnosis Date  . Shortness of breath     with exertion  . Pneumonia 05/2013  . Mental disorder     dementia - short term memory  . History of blood transfusion     after fistula ruptured  . Dementia   . Renal disorder     dialysis MWF   Past Surgical History  Procedure Laterality Date  . Dialysis fistula creation    . Ligation of arteriovenous  fistula Left 06/04/2013    Procedure: LIGATION OF ARTERIOVENOUS  FISTULA;  Surgeon: Fransisco HertzBrian L Chen, MD;  [facility];  Service: Vascular;  Laterality: Left;  . Insertion of dialysis catheter Right 06/04/2013    Procedure: INSERTION OF DIALYSIS CATHETER;  Surgeon: Fransisco HertzBrian L Chen, MD;  [facility];  Service: Vascular;  Laterality: Right;  . Cholecystectomy  1992  . Av fistula placement Right 07/07/2013    Procedure: ARTERIOVENOUS (AV) FISTULA CREATION - RIGHT BRACHIAL CEPHALIC ;  Surgeon: Fransisco HertzBrian L Chen, MD;  [facility];  Service: Vascular;  Laterality: Right;  . Removal of a dialysis catheter Right 11/09/2013    Procedure: REMOVAL OF A DIALYSIS CATHETER- Right TDC ;  Surgeon: Fransisco HertzBrian L Chen, MD;  [facility];  Service: Vascular;  Laterality: Right;  . Insertion of dialysis catheter Left 11/09/2013    Procedure:  INSERTION OF DIALYSIS CATHETER- Left TDC;  Surgeon: Fransisco HertzBrian L Chen, MD;  [facility];  Service: Vascular;  Laterality: Left;  . Exchange of a dialysis catheter Left 12/17/2013    Procedure: EXCHANGE OF A DIALYSIS CATHETER;  Surgeon: Chuck Hinthristopher S Dickson, MD;  [facility];  Service: Vascular;  Laterality: Left;  . Fistulogram Right 12/17/2013    Procedure: FISTULOGRAM- RIGHT ARM- POSSIBLE INTERVENTION;  Surgeon: Chuck Hinthristopher S Dickson, MD;  [facility];  Service: Vascular;  Laterality: Right;  . Ligation of competing branches of arteriovenous fistula Right 12/17/2013    Procedure: LIGATION OF COMPETING BRANCHES OF ARTERIOVENOUS FISTULA;  Surgeon: Chuck Hinthristopher S Dickson, MD;  [facility];  Service: Vascular;  Laterality: Right;  . Ligation of arteriovenous  fistula Right 01/19/2014    Procedure: LIGATION OF ARTERIOVENOUS  FISTULA- RIGHT BRACHIOCEPHALIC FISTULA;  Surgeon: Fransisco HertzBrian L Chen, MD;  [facility];  Service: Vascular;  Laterality: Right;        History reviewed. No pertinent family history.  Social History:  reports that she has never smoked. She has never used smokeless tobacco. She reports that she does not drink alcohol or use illicit drugs.   Allergies:  Allergies  Allergen Reactions  . Sulfa Antibiotics Nausea And Vomiting and Rash  . Sulfur Nausea And Vomiting and Rash     (Not in a hospital admission)     WUJ:WJXBJROS:apart from the symptoms mentioned above,there are no other symptoms  referable to all systems reviewed.  Physical Exam: Blood pressure 141/78, pulse 109, temperature 101.2 F (38.4 C), temperature source Rectal, resp. rate 16, height 5\' 3"  (1.6 m), weight 58.06 kg (128 lb), SpO2 93.00%. She is sleeping.. She appears to be hemodynamically stable. Heart sounds are present without gallop rhythm. She is in sinus rhythm. Lung fields are clear with inspiratory crackles and lower zones. There is no bronchial breathing. There is no wheezing. She does have  peripheral pitting edema in her lower legs.    Recent Labs  06/07/14 2100  WBC 4.9  NEUTROABS 3.7  HGB 10.6*  HCT 32.8*  MCV 93.2  PLT 107*    Recent Labs  06/07/14 2100  NA 134*  K 3.8  CL 95*  CO2 27  GLUCOSE 92  BUN 25*  CREATININE 3.22*  CALCIUM 8.4  lablast2(ast:2,ALT:2,alkphos:2,bilitot:2,prot:2,albumin:2)@    Recent Results (from the past 240 hour(s))  CULTURE, BLOOD (ROUTINE X 2)     Status: None   Collection Time    06/07/14  9:00 PM      Result Value Ref Range Status   Specimen Description BLOOD BLOOD RIGHT WRIST   Final   Special Requests BOTTLES DRAWN AEROBIC AND ANAEROBIC 7CC   Final   Culture PENDING   Incomplete   Report Status PENDING   Incomplete  CULTURE, BLOOD (ROUTINE X 2)     Status: None   Collection Time    06/07/14 10:02 PM      Result Value Ref Range Status   Specimen Description Blood BLOOD RIGHT ARM   Final   Special Requests BOTTLES DRAWN AEROBIC AND ANAEROBIC [facility]   Final   Culture PENDING   Incomplete   Report Status PENDING   Incomplete     Dg Chest 2 View  06/07/2014   CLINICAL DATA:  Fever today. History of dementia, of pneumonia and renal disorder of dialysis.  EXAM: CHEST  2 VIEW  COMPARISON:  04/24/2014 and 04/02/2014.  FINDINGS: 2217 hr. Left IJ dialysis catheter and left subclavian stent are grossly stable. There is cardiomegaly with aortic atherosclerosis. Pulmonary edema has worsened. There is chronic blunting of the left costophrenic angle which appears unchanged. No new airspace opacity is identified.  IMPRESSION: Worsening pulmonary edema consistent with congestive heart failure or volume overload. Stable blunting of the left costophrenic angle consistent with a chronic pleural effusion or pleural thickening.   Electronically Signed   By: Roxy Horseman M.D.   On: 06/07/2014 23:09   Impression: 1. UTI. 2. Fluid overload. 3. End-stage renal disease on hemodialysis. 4. Anemia of chronic kidney disease. 5. Moderate  dementia.     Plan: 1. Admit to medical floor. 2. Intravenous antibiotics for UTI. 3. Nephrology consultation. Patient will likely require further hemodialysis for her fluid overload.  Other recommendations will depend on patient's hospital progress.      GOSRANI,NIMISH C   06/08/2014, 1:04 AM

## 2014-06-09 LAB — BASIC METABOLIC PANEL
Anion gap: 10 (ref 5–15)
BUN: 29 mg/dL — AB (ref 6–23)
CHLORIDE: 99 meq/L (ref 96–112)
CO2: 30 meq/L (ref 19–32)
Calcium: 8.3 mg/dL — ABNORMAL LOW (ref 8.4–10.5)
Creatinine, Ser: 3.94 mg/dL — ABNORMAL HIGH (ref 0.50–1.10)
GFR calc Af Amer: 11 mL/min — ABNORMAL LOW (ref >90)
GFR calc non Af Amer: 10 mL/min — ABNORMAL LOW (ref >90)
GLUCOSE: 87 mg/dL (ref 70–99)
POTASSIUM: 3.9 meq/L (ref 3.7–5.3)
SODIUM: 139 meq/L (ref 137–147)

## 2014-06-09 LAB — CBC
HCT: 32.2 % — ABNORMAL LOW (ref 36.0–46.0)
HEMOGLOBIN: 10.1 g/dL — AB (ref 12.0–15.0)
MCH: 29.4 pg (ref 26.0–34.0)
MCHC: 31.4 g/dL (ref 30.0–36.0)
MCV: 93.9 fL (ref 78.0–100.0)
Platelets: 106 10*3/uL — ABNORMAL LOW (ref 150–400)
RBC: 3.43 MIL/uL — AB (ref 3.87–5.11)
RDW: 17 % — ABNORMAL HIGH (ref 11.5–15.5)
WBC: 3.6 10*3/uL — AB (ref 4.0–10.5)

## 2014-06-09 LAB — URINE CULTURE
COLONY COUNT: NO GROWTH
Culture: NO GROWTH

## 2014-06-09 LAB — HEPATITIS B SURFACE ANTIGEN: Hepatitis B Surface Ag: NEGATIVE

## 2014-06-09 LAB — PHOSPHORUS: Phosphorus: 4.2 mg/dL (ref 2.3–4.6)

## 2014-06-09 MED ORDER — EPOETIN ALFA 4000 UNIT/ML IJ SOLN
6000.0000 [IU] | INTRAMUSCULAR | Status: DC
  Filled 2014-06-09: qty 2

## 2014-06-09 MED ORDER — BIOTENE DRY MOUTH MT LIQD
15.0000 mL | Freq: Two times a day (BID) | OROMUCOSAL | Status: DC
  Administered 2014-06-09: 15 mL via OROMUCOSAL

## 2014-06-09 NOTE — Progress Notes (Signed)
UR chart review completed.  

## 2014-06-09 NOTE — Progress Notes (Signed)
[name]  MRN: [id number]  DOB/AGE: 1931-01-22 80 y.o.  Primary Care Physician:HAWKINS,EDWARD L, MD  Admit date: 06/07/2014  Chief Complaint:  Chief Complaint  Patient presents with  . Fever    S-Pt presented on  06/07/2014 with  Chief Complaint  Patient presents with  . Fever  .    Pt offers no new complaints.   Meds . antiseptic oral rinse  15 mL Mouth Rinse BID  . donepezil  10 mg Oral QHS  . epoetin alfa  10,000 Units Intravenous Q T,Th,Sa-HD  . heparin  5,000 Units Subcutaneous 3 times per day  . Memantine HCl ER  28 mg Oral Daily  . multivitamin  1 tablet Oral Daily  . piperacillin-tazobactam (ZOSYN)  IV  2.25 g Intravenous 3 times per day  . sevelamer carbonate  800 mg Oral TID WC  . sodium chloride  3 mL Intravenous Q12H  . torsemide  20 mg Oral Daily     Physical Exam: Vital signs in last 24 hours: Temp:  [97.6 F (36.4 C)-99.7 F (37.6 C)] 98 F (36.7 C) (07/22 0641) Pulse Rate:  [66-107] 69 (07/22 0641) Resp:  [16-18] 18 (07/22 0641) BP: (113-137)/(51-71) 116/60 mmHg (07/22 0641) SpO2:  [93 %-97 %] 95 % (07/22 0641) Weight:  [116 lb 13.5 oz (53 kg)-123 lb 10.9 oz (56.1 kg)] 116 lb 13.5 oz (53 kg) (07/21 1550) Weight change: -4 lb 5.2 oz (-1.96 kg) Last BM Date: 06/07/14   Physical Exam: General- pt is awake,follows commands Resp- No acute REsp distress, clear to auscultation. CVS- S1S2 regular in rate and rhythm GIT- BS+, soft, NT, ND EXT- NO LE Edema, Cyanosis Access- Left AVG+   Lab Results: CBC  Recent Labs  06/08/14 0537 06/09/14 0613  WBC 3.6* 3.6*  HGB 9.8* 10.1*  HCT 30.8* 32.2*  PLT 96* 106*    BMET  Recent Labs  06/08/14 0537 06/09/14 0613  NA 137 139  K 3.7 3.9  CL 98 99  CO2 29 30  GLUCOSE 83 87  BUN 29* 29*  CREATININE 3.90* 3.94*  CALCIUM 8.1* 8.3*    MICRO Recent Results (from the past 240 hour(s))  CULTURE, BLOOD (ROUTINE X 2)     Status: None   Collection Time    06/07/14  9:00 PM      Result  Value Ref Range Status   Specimen Description BLOOD BLOOD RIGHT WRIST   Final   Special Requests BOTTLES DRAWN AEROBIC AND ANAEROBIC 7CC   Final   Culture NO GROWTH 1 DAY   Final   Report Status PENDING   Incomplete  CULTURE, BLOOD (ROUTINE X 2)     Status: None   Collection Time    06/07/14 10:02 PM      Result Value Ref Range Status   Specimen Description Blood BLOOD RIGHT ARM   Final   Special Requests BOTTLES DRAWN AEROBIC AND ANAEROBIC 7CC   Final   Culture NO GROWTH 1 DAY   Final   Report Status PENDING   Incomplete      Lab Results  Component Value Date   PTH 495.2* 06/10/2013   CALCIUM 8.3* 06/09/2014   CAION 1.21 12/17/2013   PHOS 4.2 06/09/2014  Alb 2.4 Corrected Calcium 8.1+ 1.4=9.5     Impression: 1)Renal  ESRD on HD  On Tues/Thurs/Saturday schedule as inpt Was last dialyzed yesterday NO need of Hd today Will dialyze in am   2)HTN  BP at goal   3)Anemia  HGb at goal secondary to Chronic disease.   on EPO   4)CKD Mineral-Bone Disorder  PTH HIgh Secondary Hyperparathyroidism Present.  Phosphorus at goal.  Calcium when corrected for low albumin is at goal  5)CNS- Dementia stable   6)Electrolytes Normokalemic. NOrmonatremic   7)Acid base  Co2 at goal   8) ID- admitted with UTI On ABX      Plan:  Will dialyze in am      BHUTANI,MANPREET S 06/09/2014, 7:38 AM

## 2014-06-09 NOTE — Progress Notes (Signed)
Patient being d/c home with instructions. IV cath removed and intact. Verbalizes understanding. No c/o pain/swelling at site. Family notified of patient being d/c home and son coming to pick up patient.

## 2014-06-09 NOTE — Progress Notes (Signed)
Subjective: She seems to be doing well. She has no complaints.  Objective: Vital signs in last 24 hours: Temp:  [97.6 F (36.4 C)-99.7 F (37.6 C)] 98 F (36.7 C) (07/22 0641) Pulse Rate:  [66-107] 69 (07/22 0641) Resp:  [16-18] 18 (07/22 0641) BP: (113-137)/(51-71) 116/60 mmHg (07/22 0641) SpO2:  [93 %-97 %] 95 % (07/22 0641) Weight:  [53 kg (116 lb 13.5 oz)-56.1 kg (123 lb 10.9 oz)] 53 kg (116 lb 13.5 oz) (07/21 1550) Weight change: -1.96 kg (-4 lb 5.2 oz) Last BM Date: 06/07/14  Intake/Output from previous day: 07/21 0701 - 07/22 0700 In: 590 [P.O.:440; IV Piggyback:150] Out: 3000   PHYSICAL EXAM General appearance: alert, cooperative and no distress Resp: clear to auscultation bilaterally Cardio: regular rate and rhythm, S1, S2 normal, no murmur, click, rub or gallop GI: soft, non-tender; bowel sounds normal; no masses,  no organomegaly Extremities: extremities normal, atraumatic, no cyanosis or edema  Lab Results:  Results for orders placed during the hospital encounter of 06/07/14 (from the past 48 hour(s))  CBC WITH DIFFERENTIAL     Status: Abnormal   Collection Time    06/07/14  9:00 PM      Result Value Ref Range   WBC 4.9  4.0 - 10.5 K/uL   RBC 3.52 (*) 3.87 - 5.11 MIL/uL   Hemoglobin 10.6 (*) 12.0 - 15.0 g/dL   HCT 32.8 (*) 36.0 - 46.0 %   MCV 93.2  78.0 - 100.0 fL   MCH 30.1  26.0 - 34.0 pg   MCHC 32.3  30.0 - 36.0 g/dL   RDW 17.0 (*) 11.5 - 15.5 %   Platelets 107 (*) 150 - 400 K/uL   Comment: SPECIMEN CHECKED FOR CLOTS     PLATELET COUNT CONFIRMED BY SMEAR   Neutrophils Relative % 75  43 - 77 %   Neutro Abs 3.7  1.7 - 7.7 K/uL   Lymphocytes Relative 14  12 - 46 %   Lymphs Abs 0.7  0.7 - 4.0 K/uL   Monocytes Relative 6  3 - 12 %   Monocytes Absolute 0.3  0.1 - 1.0 K/uL   Eosinophils Relative 5  0 - 5 %   Eosinophils Absolute 0.2  0.0 - 0.7 K/uL   Basophils Relative 1  0 - 1 %   Basophils Absolute 0.0  0.0 - 0.1 K/uL  COMPREHENSIVE METABOLIC PANEL      Status: Abnormal   Collection Time    06/07/14  9:00 PM      Result Value Ref Range   Sodium 134 (*) 137 - 147 mEq/L   Potassium 3.8  3.7 - 5.3 mEq/L   Chloride 95 (*) 96 - 112 mEq/L   CO2 27  19 - 32 mEq/L   Glucose, Bld 92  70 - 99 mg/dL   BUN 25 (*) 6 - 23 mg/dL   Creatinine, Ser 3.22 (*) 0.50 - 1.10 mg/dL   Calcium 8.4  8.4 - 10.5 mg/dL   Total Protein 7.3  6.0 - 8.3 g/dL   Albumin 2.8 (*) 3.5 - 5.2 g/dL   AST 13  0 - 37 U/L   ALT 6  0 - 35 U/L   Alkaline Phosphatase 92  39 - 117 U/L   Total Bilirubin 0.4  0.3 - 1.2 mg/dL   GFR calc non Af Amer 13 (*) >90 mL/min   GFR calc Af Amer 14 (*) >90 mL/min   Comment: (NOTE)     The  eGFR has been calculated using the CKD EPI equation.     This calculation has not been validated in all clinical situations.     eGFR's persistently <90 mL/min signify possible Chronic Kidney     Disease.   Anion gap 12  5 - 15  LIPASE, BLOOD     Status: None   Collection Time    06/07/14  9:00 PM      Result Value Ref Range   Lipase 58  11 - 59 U/L  TROPONIN I     Status: None   Collection Time    06/07/14  9:00 PM      Result Value Ref Range   Troponin I <0.30  <0.30 ng/mL   Comment:            Due to the release kinetics of cTnI,     a negative result within the first hours     of the onset of symptoms does not rule out     myocardial infarction with certainty.     If myocardial infarction is still suspected,     repeat the test at appropriate intervals.  CULTURE, BLOOD (ROUTINE X 2)     Status: None   Collection Time    06/07/14  9:00 PM      Result Value Ref Range   Specimen Description BLOOD BLOOD RIGHT WRIST     Special Requests BOTTLES DRAWN AEROBIC AND ANAEROBIC [place]     Culture NO GROWTH 1 DAY     Report Status PENDING    LACTIC ACID, PLASMA     Status: None   Collection Time    06/07/14  9:00 PM      Result Value Ref Range   Lactic Acid, Venous 1.6  0.5 - 2.2 mmol/L  URINALYSIS, ROUTINE W REFLEX MICROSCOPIC     Status:  Abnormal   Collection Time    06/07/14  9:57 PM      Result Value Ref Range   Color, Urine YELLOW  YELLOW   APPearance CLOUDY (*) CLEAR   Specific Gravity, Urine 1.020  1.005 - 1.030   pH 7.0  5.0 - 8.0   Glucose, UA NEGATIVE  NEGATIVE mg/dL   Hgb urine dipstick LARGE (*) NEGATIVE   Bilirubin Urine SMALL (*) NEGATIVE   Ketones, ur NEGATIVE  NEGATIVE mg/dL   Protein, ur >300 (*) NEGATIVE mg/dL   Urobilinogen, UA 0.2  0.0 - 1.0 mg/dL   Nitrite NEGATIVE  NEGATIVE   Leukocytes, UA LARGE (*) NEGATIVE  URINE MICROSCOPIC-ADD ON     Status: Abnormal   Collection Time    06/07/14  9:57 PM      Result Value Ref Range   Squamous Epithelial / LPF RARE  RARE   WBC, UA TOO NUMEROUS TO COUNT  <3 WBC/hpf   RBC / HPF 7-10  <3 RBC/hpf   Bacteria, UA MANY (*) RARE  CULTURE, BLOOD (ROUTINE X 2)     Status: None   Collection Time    06/07/14 10:02 PM      Result Value Ref Range   Specimen Description Blood BLOOD RIGHT ARM     Special Requests BOTTLES DRAWN AEROBIC AND ANAEROBIC [place]     Culture NO GROWTH 1 DAY     Report Status PENDING    COMPREHENSIVE METABOLIC PANEL     Status: Abnormal   Collection Time    06/08/14  5:37 AM      Result Value Ref Range  Sodium 137  137 - 147 mEq/L   Potassium 3.7  3.7 - 5.3 mEq/L   Chloride 98  96 - 112 mEq/L   CO2 29  19 - 32 mEq/L   Glucose, Bld 83  70 - 99 mg/dL   BUN 29 (*) 6 - 23 mg/dL   Creatinine, Ser 3.90 (*) 0.50 - 1.10 mg/dL   Calcium 8.1 (*) 8.4 - 10.5 mg/dL   Total Protein 6.4  6.0 - 8.3 g/dL   Albumin 2.4 (*) 3.5 - 5.2 g/dL   AST 12  0 - 37 U/L   ALT 5  0 - 35 U/L   Alkaline Phosphatase 76  39 - 117 U/L   Total Bilirubin 0.4  0.3 - 1.2 mg/dL   GFR calc non Af Amer 10 (*) >90 mL/min   GFR calc Af Amer 11 (*) >90 mL/min   Comment: (NOTE)     The eGFR has been calculated using the CKD EPI equation.     This calculation has not been validated in all clinical situations.     eGFR's persistently <90 mL/min signify possible Chronic Kidney      Disease.   Anion gap 10  5 - 15  CBC     Status: Abnormal   Collection Time    06/08/14  5:37 AM      Result Value Ref Range   WBC 3.6 (*) 4.0 - 10.5 K/uL   RBC 3.26 (*) 3.87 - 5.11 MIL/uL   Hemoglobin 9.8 (*) 12.0 - 15.0 g/dL   HCT 30.8 (*) 36.0 - 46.0 %   MCV 94.5  78.0 - 100.0 fL   MCH 30.1  26.0 - 34.0 pg   MCHC 31.8  30.0 - 36.0 g/dL   RDW 17.0 (*) 11.5 - 15.5 %   Platelets 96 (*) 150 - 400 K/uL   Comment: PLATELET COUNT CONFIRMED BY SMEAR     SPECIMEN CHECKED FOR CLOTS  HEPATITIS B SURFACE ANTIGEN     Status: None   Collection Time    06/08/14 12:25 PM      Result Value Ref Range   Hepatitis B Surface Ag NEGATIVE  NEGATIVE   Comment: Performed at [facility]     Status: Abnormal   Collection Time    06/09/14  6:13 AM      Result Value Ref Range   Sodium 139  137 - 147 mEq/L   Potassium 3.9  3.7 - 5.3 mEq/L   Chloride 99  96 - 112 mEq/L   CO2 30  19 - 32 mEq/L   Glucose, Bld 87  70 - 99 mg/dL   BUN 29 (*) 6 - 23 mg/dL   Creatinine, Ser 3.94 (*) 0.50 - 1.10 mg/dL   Calcium 8.3 (*) 8.4 - 10.5 mg/dL   GFR calc non Af Amer 10 (*) >90 mL/min   GFR calc Af Amer 11 (*) >90 mL/min   Comment: (NOTE)     The eGFR has been calculated using the CKD EPI equation.     This calculation has not been validated in all clinical situations.     eGFR's persistently <90 mL/min signify possible Chronic Kidney     Disease.   Anion gap 10  5 - 15  CBC     Status: Abnormal   Collection Time    06/09/14  6:13 AM      Result Value Ref Range   WBC 3.6 (*) 4.0 - 10.5  K/uL   RBC 3.43 (*) 3.87 - 5.11 MIL/uL   Hemoglobin 10.1 (*) 12.0 - 15.0 g/dL   HCT 32.2 (*) 36.0 - 46.0 %   MCV 93.9  78.0 - 100.0 fL   MCH 29.4  26.0 - 34.0 pg   MCHC 31.4  30.0 - 36.0 g/dL   RDW 17.0 (*) 11.5 - 15.5 %   Platelets 106 (*) 150 - 400 K/uL   Comment: CONSISTENT WITH PREVIOUS RESULT  PHOSPHORUS     Status: None   Collection Time    06/09/14  6:13 AM      Result Value Ref  Range   Phosphorus 4.2  2.3 - 4.6 mg/dL    ABGS No results found for this basename: PHART, PCO2, PO2ART, TCO2, HCO3,  in the last 72 hours CULTURES Recent Results (from the past 240 hour(s))  CULTURE, BLOOD (ROUTINE X 2)     Status: None   Collection Time    06/07/14  9:00 PM      Result Value Ref Range Status   Specimen Description BLOOD BLOOD RIGHT WRIST   Final   Special Requests BOTTLES DRAWN AEROBIC AND ANAEROBIC [place]   Final   Culture NO GROWTH 1 DAY   Final   Report Status PENDING   Incomplete  CULTURE, BLOOD (ROUTINE X 2)     Status: None   Collection Time    06/07/14 10:02 PM      Result Value Ref Range Status   Specimen Description Blood BLOOD RIGHT ARM   Final   Special Requests BOTTLES DRAWN AEROBIC AND ANAEROBIC [place]   Final   Culture NO GROWTH 1 DAY   Final   Report Status PENDING   Incomplete   Studies/Results: Dg Chest 2 View  06/07/2014   CLINICAL DATA:  Fever today. History of dementia, of pneumonia and renal disorder of dialysis.  EXAM: CHEST  2 VIEW  COMPARISON:  04/24/2014 and 04/02/2014.  FINDINGS: 2217 hr. Left IJ dialysis catheter and left subclavian stent are grossly stable. There is cardiomegaly with aortic atherosclerosis. Pulmonary edema has worsened. There is chronic blunting of the left costophrenic angle which appears unchanged. No new airspace opacity is identified.  IMPRESSION: Worsening pulmonary edema consistent with congestive heart failure or volume overload. Stable blunting of the left costophrenic angle consistent with a chronic pleural effusion or pleural thickening.   Electronically Signed   By: Camie Patience M.D.   On: 06/07/2014 23:09    Medications:  Prior to Admission:  Prescriptions prior to admission  Medication Sig Dispense Refill  . ciprofloxacin (CIPRO) 250 MG tablet Take 250 mg by mouth daily.      [name] donepezil (ARICEPT) 10 MG tablet Take 10 mg by mouth at bedtime.      . Memantine HCl ER (NAMENDA XR) 28 MG CP24 Take 28 mg by mouth  daily.      . multivitamin (RENA-VIT) TABS tablet Take 1 tablet by mouth daily.      . sevelamer carbonate (RENVELA) 800 MG tablet Take 800 mg by mouth 3 (three) times daily with meals.      . torsemide (DEMADEX) 20 MG tablet Take 20 mg by mouth daily.       Scheduled: . antiseptic oral rinse  15 mL Mouth Rinse BID  . donepezil  10 mg Oral QHS  . [START ON 06/10/2014] epoetin alfa  6,000 Units Intravenous Q T,Th,Sa-HD  . Memantine HCl ER  28 mg Oral Daily  . multivitamin  1 tablet Oral Daily  . piperacillin-tazobactam (ZOSYN)  IV  2.25 g Intravenous 3 times per day  . sevelamer carbonate  800 mg Oral TID WC  . sodium chloride  3 mL Intravenous Q12H  . torsemide  20 mg Oral Daily   Continuous:  BZM:CEYEMV chloride, sodium chloride, feeding supplement (NEPRO CARB STEADY), heparin, lidocaine (PF), lidocaine-prilocaine, ondansetron (ZOFRAN) IV, ondansetron, pentafluoroprop-tetrafluoroeth  Assesment: She was admitted with presumed urinary tract infection. At baseline she has dementia and her mental status was worse than usual. Her urine culture is pending. She is on Zosyn and has improved but I cannot send her home on Zosyn so I'm awaiting the urine culture before I decide on discharge Active Problems:   CKD (chronic kidney disease) stage V requiring chronic dialysis   Volume overload   UTI (lower urinary tract infection)   Anemia of chronic disease   Dementia   Urinary tract infection    Plan: Await urine culture before discharge if she can be on an oral antibiotic she could be discharged home soon. If she requires IV antibiotics I need to coordinate that with her dialysis    LOS: 2 days   Bana Borgmeyer L 06/09/2014, 9:02 AM

## 2014-06-11 ENCOUNTER — Emergency Department (HOSPITAL_COMMUNITY): Payer: Medicare HMO

## 2014-06-11 ENCOUNTER — Inpatient Hospital Stay (HOSPITAL_COMMUNITY)
Admission: EM | Admit: 2014-06-11 | Discharge: 2014-06-15 | Disposition: A | Discharge status: Other Acute Inpatient Hospital | Payer: Medicare HMO | Source: Home / Self Care | Attending: Pulmonary Disease | Admitting: Pulmonary Disease

## 2014-06-11 ENCOUNTER — Encounter (HOSPITAL_COMMUNITY): Payer: Self-pay | Admitting: Emergency Medicine

## 2014-06-11 DIAGNOSIS — I120 Hypertensive chronic kidney disease with stage 5 chronic kidney disease or end stage renal disease: Secondary | ICD-10-CM | POA: Diagnosis present

## 2014-06-11 DIAGNOSIS — N186 End stage renal disease: Secondary | ICD-10-CM | POA: Diagnosis present

## 2014-06-11 DIAGNOSIS — D638 Anemia in other chronic diseases classified elsewhere: Secondary | ICD-10-CM

## 2014-06-11 DIAGNOSIS — N390 Urinary tract infection, site not specified: Principal | ICD-10-CM

## 2014-06-11 DIAGNOSIS — F0390 Unspecified dementia without behavioral disturbance: Secondary | ICD-10-CM

## 2014-06-11 DIAGNOSIS — Y841 Kidney dialysis as the cause of abnormal reaction of the patient, or of later complication, without mention of misadventure at the time of the procedure: Secondary | ICD-10-CM

## 2014-06-11 DIAGNOSIS — Z992 Dependence on renal dialysis: Secondary | ICD-10-CM

## 2014-06-11 DIAGNOSIS — T827XXA Infection and inflammatory reaction due to other cardiac and vascular devices, implants and grafts, initial encounter: Secondary | ICD-10-CM

## 2014-06-11 LAB — URINE MICROSCOPIC-ADD ON

## 2014-06-11 LAB — BASIC METABOLIC PANEL
Anion gap: 10 (ref 5–15)
BUN: 21 mg/dL (ref 6–23)
CHLORIDE: 96 meq/L (ref 96–112)
CO2: 31 mEq/L (ref 19–32)
Calcium: 8.3 mg/dL — ABNORMAL LOW (ref 8.4–10.5)
Creatinine, Ser: 3.23 mg/dL — ABNORMAL HIGH (ref 0.50–1.10)
GFR calc Af Amer: 14 mL/min — ABNORMAL LOW (ref >90)
GFR, EST NON AFRICAN AMERICAN: 12 mL/min — AB (ref >90)
GLUCOSE: 141 mg/dL — AB (ref 70–99)
Potassium: 4 mEq/L (ref 3.7–5.3)
Sodium: 137 mEq/L (ref 137–147)

## 2014-06-11 LAB — CBC WITH DIFFERENTIAL/PLATELET
Basophils Absolute: 0 10*3/uL (ref 0.0–0.1)
Basophils Relative: 0 % (ref 0–1)
EOS ABS: 0.3 10*3/uL (ref 0.0–0.7)
Eosinophils Relative: 6 % — ABNORMAL HIGH (ref 0–5)
HEMATOCRIT: 30.1 % — AB (ref 36.0–46.0)
HEMOGLOBIN: 9.8 g/dL — AB (ref 12.0–15.0)
Lymphocytes Relative: 18 % (ref 12–46)
Lymphs Abs: 0.7 10*3/uL (ref 0.7–4.0)
MCH: 30.1 pg (ref 26.0–34.0)
MCHC: 32.6 g/dL (ref 30.0–36.0)
MCV: 92.3 fL (ref 78.0–100.0)
MONO ABS: 0.3 10*3/uL (ref 0.1–1.0)
MONOS PCT: 7 % (ref 3–12)
Neutro Abs: 2.8 10*3/uL (ref 1.7–7.7)
Neutrophils Relative %: 69 % (ref 43–77)
Platelets: 128 10*3/uL — ABNORMAL LOW (ref 150–400)
RBC: 3.26 MIL/uL — ABNORMAL LOW (ref 3.87–5.11)
RDW: 16.5 % — ABNORMAL HIGH (ref 11.5–15.5)
WBC: 4.1 10*3/uL (ref 4.0–10.5)

## 2014-06-11 LAB — URINALYSIS, ROUTINE W REFLEX MICROSCOPIC
Bilirubin Urine: NEGATIVE
GLUCOSE, UA: NEGATIVE mg/dL
Ketones, ur: NEGATIVE mg/dL
Nitrite: NEGATIVE
Protein, ur: >300 mg/dL — AB
SPECIFIC GRAVITY, URINE: 1.02 (ref 1.005–1.030)
Urobilinogen, UA: 0.2 mg/dL (ref 0.0–1.0)
pH: 7.5 (ref 5.0–8.0)

## 2014-06-11 LAB — LACTIC ACID, PLASMA: Lactic Acid, Venous: 1.1 mmol/L (ref 0.5–2.2)

## 2014-06-11 MED ORDER — SODIUM CHLORIDE 0.9 % IJ SOLN
3.0000 mL | INTRAMUSCULAR | Status: DC | PRN
Start: 2014-06-11 — End: 2014-06-15

## 2014-06-11 MED ORDER — TORSEMIDE 20 MG PO TABS
20.0000 mg | ORAL_TABLET | Freq: Every day | ORAL | Status: DC
  Administered 2014-06-12 – 2014-06-15 (×4): 20 mg via ORAL
  Filled 2014-06-11 (×4): qty 1

## 2014-06-11 MED ORDER — DEXTROSE 5 % IV SOLN
1.0000 g | INTRAVENOUS | Status: DC
  Administered 2014-06-11 – 2014-06-12 (×2): 1 g via INTRAVENOUS
  Filled 2014-06-11 (×3): qty 10

## 2014-06-11 MED ORDER — MEMANTINE HCL ER 28 MG PO CP24: 28.0000 mg | ORAL_CAPSULE | Freq: Every day | ORAL | Status: DC

## 2014-06-11 MED ORDER — SODIUM CHLORIDE 0.9 % IJ SOLN
3.0000 mL | Freq: Two times a day (BID) | INTRAMUSCULAR | Status: DC
  Administered 2014-06-11 – 2014-06-14 (×4): 3 mL via INTRAVENOUS

## 2014-06-11 MED ORDER — DONEPEZIL HCL 5 MG PO TABS
10.0000 mg | ORAL_TABLET | Freq: Every day | ORAL | Status: DC
  Administered 2014-06-11 – 2014-06-14 (×4): 10 mg via ORAL
  Filled 2014-06-11 (×4): qty 2

## 2014-06-11 MED ORDER — RENA-VITE PO TABS
1.0000 | ORAL_TABLET | Freq: Every day | ORAL | Status: DC
  Administered 2014-06-12 – 2014-06-15 (×4): 1 via ORAL
  Filled 2014-06-11 (×5): qty 1

## 2014-06-11 MED ORDER — ACETAMINOPHEN 650 MG RE SUPP: 650.0000 mg | Freq: Four times a day (QID) | RECTAL | Status: DC | PRN

## 2014-06-11 MED ORDER — SEVELAMER CARBONATE 800 MG PO TABS
800.0000 mg | ORAL_TABLET | Freq: Three times a day (TID) | ORAL | Status: DC
  Administered 2014-06-12 – 2014-06-15 (×10): 800 mg via ORAL
  Filled 2014-06-11 (×12): qty 1

## 2014-06-11 MED ORDER — SODIUM CHLORIDE 0.9 % IV SOLN: 250.0000 mL | INTRAVENOUS | Status: DC | PRN

## 2014-06-11 MED ORDER — ACETAMINOPHEN 325 MG PO TABS
650.0000 mg | ORAL_TABLET | Freq: Four times a day (QID) | ORAL | Status: DC | PRN
  Administered 2014-06-13 – 2014-06-14 (×2): 650 mg via ORAL
  Filled 2014-06-11 (×2): qty 2

## 2014-06-11 NOTE — ED Provider Notes (Signed)
CSN: [id number]     Arrival date & time 06/11/14  2021 History   First MD Initiated Contact with Patient 06/11/14 2035   This chart was scribed for Donnetta Hutching, MD by Gwenevere Abbot, ED scribe. This patient was seen in room APA19/APA19 and the patient's care was started at 8:46 PM. Level 5 Caveat   Chief Complaint  Patient presents with  . Fever   The history is provided by a relative. The history is limited by the condition of the patient. No language interpreter was used.   HPI Comments: Level 5 Caveat for dementia [name] is a 80 y.o. female who presents to the Emergency Department complaining of a fever of 100.3. Son reports that he gave pt  2 regular strength tylenol, without relief. Son reports that pt has received dialysis every MWF for the last five years, but tolerates treatment. Son reports that pt was admitted to hospital for UTI on wednesday, was treated with antibiotics and discharged on Wednesday. Son reports that pt has had a UTI each month since 03/2014. Son reports that pt has minimal void.    Past Medical History  Diagnosis Date  . Shortness of breath     with exertion  . Pneumonia 05/2013  . Mental disorder     dementia - short term memory  . History of blood transfusion     after fistula ruptured  . Dementia   . Renal disorder     dialysis MWF   Past Surgical History  Procedure Laterality Date  . Dialysis fistula creation    . Ligation of arteriovenous  fistula Left 06/04/2013    Procedure: LIGATION OF ARTERIOVENOUS  FISTULA;  Surgeon: Fransisco Hertz, MD;  Location: [facility] OR;  Service: Vascular;  Laterality: Left;  . Insertion of dialysis catheter Right 06/04/2013    Procedure: INSERTION OF DIALYSIS CATHETER;  Surgeon: Fransisco Hertz, MD;  Location: [facility] OR;  Service: Vascular;  Laterality: Right;  . Cholecystectomy  1992  . Av fistula placement Right 07/07/2013    Procedure: ARTERIOVENOUS (AV) FISTULA CREATION - RIGHT BRACHIAL CEPHALIC ;  Surgeon: Fransisco Hertz, MD;   Location: [facility] OR;  Service: Vascular;  Laterality: Right;  . Removal of a dialysis catheter Right 11/09/2013    Procedure: REMOVAL OF A DIALYSIS CATHETER- Right TDC ;  Surgeon: Fransisco Hertz, MD;  Location: [facility] OR;  Service: Vascular;  Laterality: Right;  . Insertion of dialysis catheter Left 11/09/2013    Procedure: INSERTION OF DIALYSIS CATHETER- Left TDC;  Surgeon: Fransisco Hertz, MD;  Location: [facility] OR;  Service: Vascular;  Laterality: Left;  . Exchange of a dialysis catheter Left 12/17/2013    Procedure: EXCHANGE OF A DIALYSIS CATHETER;  Surgeon: Chuck Hint, MD;  Location: [facility] OR;  Service: Vascular;  Laterality: Left;  . Fistulogram Right 12/17/2013    Procedure: FISTULOGRAM- RIGHT ARM- POSSIBLE INTERVENTION;  Surgeon: Chuck Hint, MD;  Location: [facility] OR;  Service: Vascular;  Laterality: Right;  . Ligation of competing branches of arteriovenous fistula Right 12/17/2013    Procedure: LIGATION OF COMPETING BRANCHES OF ARTERIOVENOUS FISTULA;  Surgeon: Chuck Hint, MD;  Location: [facility] OR;  Service: Vascular;  Laterality: Right;  . Ligation of arteriovenous  fistula Right 01/19/2014    Procedure: LIGATION OF ARTERIOVENOUS  FISTULA- RIGHT BRACHIOCEPHALIC FISTULA;  Surgeon: Fransisco Hertz, MD;  Location: [facility] OR;  Service: Vascular;  Laterality: Right;   History reviewed. No pertinent family history. History  Substance  Use Topics  . Smoking status: Never Smoker   . Smokeless tobacco: Never Used  . Alcohol Use: No   OB History   Grav Para Term Preterm Abortions TAB SAB Ect Mult Living                 Review of Systems  Constitutional: Positive for fever.  Genitourinary: Positive for difficulty urinating.  All other systems reviewed and are negative.     Allergies  Sulfa antibiotics and Sulfur  Home Medications   Prior to Admission medications   Medication Sig Start Date End Date Taking? Authorizing Provider  donepezil (ARICEPT) 10 MG tablet Take 10 mg by mouth at  bedtime.   Yes Historical Provider, MD  Memantine HCl ER (NAMENDA XR) 28 MG CP24 Take 28 mg by mouth daily.   Yes Historical Provider, MD  multivitamin (RENA-VIT) TABS tablet Take 1 tablet by mouth daily.   Yes Historical Provider, MD  sevelamer carbonate (RENVELA) 800 MG tablet Take 800 mg by mouth 3 (three) times daily with meals.   Yes Historical Provider, MD  torsemide (DEMADEX) 20 MG tablet Take 20 mg by mouth daily.   Yes Historical Provider, MD   BP 134/59  Pulse 86  Temp(Src) 99.1 F (37.3 C) (Oral)  Resp 20  SpO2 99% Physical Exam  Nursing note and vitals reviewed. Constitutional: She is oriented to person, place, and time. She appears well-developed and well-nourished.  HENT:  Head: Normocephalic and atraumatic.  Eyes: Conjunctivae and EOM are normal. Pupils are equal, round, and reactive to light.  Neck: Normal range of motion. Neck supple.  Cardiovascular: Normal rate, regular rhythm and normal heart sounds.   Pulmonary/Chest: Effort normal and breath sounds normal.  Abdominal: Soft. Bowel sounds are normal.  Musculoskeletal: Normal range of motion.  Neurological: She is alert and oriented to person, place, and time.  Skin: Skin is warm and dry.  Psychiatric: She has a normal mood and affect. Her behavior is normal.    ED Course  Procedures  DIAGNOSTIC STUDIES: Oxygen Saturation is 99% on RA, normal by my interpretation.  COORDINATION OF CARE: 8:50 PM-Discussed treatment plan which includes urinalysis and blood count with relative at bedside and relative agreed to plan. Results for orders placed during the hospital encounter of 06/11/14  CBC WITH DIFFERENTIAL      Result Value Ref Range   WBC 4.1  4.0 - 10.5 K/uL   RBC 3.26 (*) 3.87 - 5.11 MIL/uL   Hemoglobin 9.8 (*) 12.0 - 15.0 g/dL   HCT 16.130.1 (*) 09.636.0 - 04.546.0 %   MCV 92.3  78.0 - 100.0 fL   MCH 30.1  26.0 - 34.0 pg   MCHC 32.6  30.0 - 36.0 g/dL   RDW 40.916.5 (*) 81.111.5 - 91.415.5 %   Platelets 128 (*) 150 - 400 K/uL    Neutrophils Relative % 69  43 - 77 %   Neutro Abs 2.8  1.7 - 7.7 K/uL   Lymphocytes Relative 18  12 - 46 %   Lymphs Abs 0.7  0.7 - 4.0 K/uL   Monocytes Relative 7  3 - 12 %   Monocytes Absolute 0.3  0.1 - 1.0 K/uL   Eosinophils Relative 6 (*) 0 - 5 %   Eosinophils Absolute 0.3  0.0 - 0.7 K/uL   Basophils Relative 0  0 - 1 %   Basophils Absolute 0.0  0.0 - 0.1 K/uL  BASIC METABOLIC PANEL      Result Value  Ref Range   Sodium 137  137 - 147 mEq/L   Potassium 4.0  3.7 - 5.3 mEq/L   Chloride 96  96 - 112 mEq/L   CO2 31  19 - 32 mEq/L   Glucose, Bld 141 (*) 70 - 99 mg/dL   BUN 21  6 - 23 mg/dL   Creatinine, Ser 1.61 (*) 0.50 - 1.10 mg/dL   Calcium 8.3 (*) 8.4 - 10.5 mg/dL   GFR calc non Af Amer 12 (*) >90 mL/min   GFR calc Af Amer 14 (*) >90 mL/min   Anion gap 10  5 - 15  URINALYSIS, ROUTINE W REFLEX MICROSCOPIC      Result Value Ref Range   Color, Urine AMBER (*) YELLOW   APPearance CLOUDY (*) CLEAR   Specific Gravity, Urine 1.020  1.005 - 1.030   pH 7.5  5.0 - 8.0   Glucose, UA NEGATIVE  NEGATIVE mg/dL   Hgb urine dipstick LARGE (*) NEGATIVE   Bilirubin Urine NEGATIVE  NEGATIVE   Ketones, ur NEGATIVE  NEGATIVE mg/dL   Protein, ur >096 (*) NEGATIVE mg/dL   Urobilinogen, UA 0.2  0.0 - 1.0 mg/dL   Nitrite NEGATIVE  NEGATIVE   Leukocytes, UA LARGE (*) NEGATIVE  URINE MICROSCOPIC-ADD ON      Result Value Ref Range   WBC, UA TOO NUMEROUS TO COUNT  <3 WBC/hpf   RBC / HPF TOO NUMEROUS TO COUNT  <3 RBC/hpf   Bacteria, UA MANY (*) RARE   Urine-Other MICROSCOPIC EXAM PERFORMED ON UNCONCENTRATED URINE     Dg Chest 2 View  06/07/2014   CLINICAL DATA:  Fever today. History of dementia, of pneumonia and renal disorder of dialysis.  EXAM: CHEST  2 VIEW  COMPARISON:  04/24/2014 and 04/02/2014.  FINDINGS: 2217 hr. Left IJ dialysis catheter and left subclavian stent are grossly stable. There is cardiomegaly with aortic atherosclerosis. Pulmonary edema has worsened. There is chronic blunting  of the left costophrenic angle which appears unchanged. No new airspace opacity is identified.  IMPRESSION: Worsening pulmonary edema consistent with congestive heart failure or volume overload. Stable blunting of the left costophrenic angle consistent with a chronic pleural effusion or pleural thickening.   Electronically Signed   By: Roxy Horseman M.D.   On: 06/07/2014 23:09              EKG Interpretation None      MDM   Final diagnoses:  UTI (lower urinary tract infection)  End stage renal disease   Patient has end-stage renal disease with a urinary tract infection. IV Rocephin per pharmacy consultation. admit for observation.  I personally performed the services described in this documentation, which was scribed in my presence. The recorded information has been reviewed and is accurate.      Donnetta Hutching, MD 06/11/14 2245

## 2014-06-11 NOTE — H&P (Signed)
PCP:   Fredirick MaudlinHAWKINS,EDWARD L, MD   Chief Complaint:  fever  HPI: 80 yo female esrd dialysis m/w/f, freq uti, dementia comes in with son for fever.  She was just d/c on Wednesday with uti but unclear if abx arranged with dialysis or not (no d/c summary).  Son reports she went home, then started getting  More weak and fever today.  No cough.    Review of Systems:  Unobtainable from patient  Past Medical History: Past Medical History  Diagnosis Date  . Shortness of breath     with exertion  . Pneumonia 05/2013  . Mental disorder     dementia - short term memory  . History of blood transfusion     after fistula ruptured  . Dementia   . Renal disorder     dialysis MWF   Past Surgical History  Procedure Laterality Date  . Dialysis fistula creation    . Ligation of arteriovenous  fistula Left 06/04/2013    Procedure: LIGATION OF ARTERIOVENOUS  FISTULA;  Surgeon: Fransisco HertzBrian L Chen, MD;  [facility];  Service: Vascular;  Laterality: Left;  . Insertion of dialysis catheter Right 06/04/2013    Procedure: INSERTION OF DIALYSIS CATHETER;  Surgeon: Fransisco HertzBrian L Chen, MD;  [facility];  Service: Vascular;  Laterality: Right;  . Cholecystectomy  1992  . Av fistula placement Right 07/07/2013    Procedure: ARTERIOVENOUS (AV) FISTULA CREATION - RIGHT BRACHIAL CEPHALIC ;  Surgeon: Fransisco HertzBrian L Chen, MD;  [facility];  Service: Vascular;  Laterality: Right;  . Removal of a dialysis catheter Right 11/09/2013    Procedure: REMOVAL OF A DIALYSIS CATHETER- Right TDC ;  Surgeon: Fransisco HertzBrian L Chen, MD;  [facility];  Service: Vascular;  Laterality: Right;  . Insertion of dialysis catheter Left 11/09/2013    Procedure: INSERTION OF DIALYSIS CATHETER- Left TDC;  Surgeon: Fransisco HertzBrian L Chen, MD;  [facility];  Service: Vascular;  Laterality: Left;  . Exchange of a dialysis catheter Left 12/17/2013    Procedure: EXCHANGE OF A DIALYSIS CATHETER;  Surgeon: Chuck Hinthristopher S Dickson, MD;  [facility];  Service: Vascular;   Laterality: Left;  . Fistulogram Right 12/17/2013    Procedure: FISTULOGRAM- RIGHT ARM- POSSIBLE INTERVENTION;  Surgeon: Chuck Hinthristopher S Dickson, MD;  [facility];  Service: Vascular;  Laterality: Right;  . Ligation of competing branches of arteriovenous fistula Right 12/17/2013    Procedure: LIGATION OF COMPETING BRANCHES OF ARTERIOVENOUS FISTULA;  Surgeon: Chuck Hinthristopher S Dickson, MD;  [facility];  Service: Vascular;  Laterality: Right;  . Ligation of arteriovenous  fistula Right 01/19/2014    Procedure: LIGATION OF ARTERIOVENOUS  FISTULA- RIGHT BRACHIOCEPHALIC FISTULA;  Surgeon: Fransisco HertzBrian L Chen, MD;  [facility];  Service: Vascular;  Laterality: Right;    Medications: Prior to Admission medications   Medication Sig Start Date End Date Taking? Authorizing Provider  ciprofloxacin (CIPRO) 250 MG tablet Take 250 mg by mouth daily.    Historical Provider, MD  donepezil (ARICEPT) 10 MG tablet Take 10 mg by mouth at bedtime.    Historical Provider, MD  Memantine HCl ER (NAMENDA XR) 28 MG CP24 Take 28 mg by mouth daily.    Historical Provider, MD  multivitamin (RENA-VIT) TABS tablet Take 1 tablet by mouth daily.    Historical Provider, MD  sevelamer carbonate (RENVELA) 800 MG tablet Take 800 mg by mouth 3 (three) times daily with meals.    Historical Provider, MD  torsemide (DEMADEX) 20 MG  tablet Take 20 mg by mouth daily.    Historical Provider, MD    Allergies:   Allergies  Allergen Reactions  . Sulfa Antibiotics Nausea And Vomiting and Rash  . Sulfur Nausea And Vomiting and Rash    Social History:  reports that she has never smoked. She has never used smokeless tobacco. She reports that she does not drink alcohol or use illicit drugs.  Family History: History reviewed. No pertinent family history.  Physical Exam: Filed Vitals:   06/11/14 2025 06/11/14 2037 06/11/14 2039 06/11/14 2100  BP: 134/59 116/59  109/57  Pulse: 86  82 84  Temp: 99.1 F (37.3 C)     TempSrc: Oral      Resp: 20     SpO2: 99%  95% 96%   General appearance: alert, cooperative and no distress Head: Normocephalic, without obvious abnormality, atraumatic Eyes: negative Nose: Nares normal. Septum midline. Mucosa normal. No drainage or sinus tenderness. Neck: no JVD and supple, symmetrical, trachea midline Lungs: clear to auscultation bilaterally Heart: regular rate and rhythm, S1, S2 normal, no murmur, click, rub or gallop Abdomen: soft, non-tender; bowel sounds normal; no masses,  no organomegaly Extremities: extremities normal, atraumatic, no cyanosis or edema Pulses: 2+ and symmetric Skin: Skin color, texture, turgor normal. No rashes or lesions Neurologic: Grossly normal  Labs on Admission:   Recent Labs  06/09/14 0613 06/11/14 2108  NA 139 137  K 3.9 4.0  CL 99 96  CO2 30 31  GLUCOSE 87 141*  BUN 29* 21  CREATININE 3.94* 3.23*  CALCIUM 8.3* 8.3*  PHOS 4.2  --     Recent Labs  06/09/14 0613 06/11/14 2108  WBC 3.6* 4.1  NEUTROABS  --  2.8  HGB 10.1* 9.8*  HCT 32.2* 30.1*  MCV 93.9 92.3  PLT 106* 128*   Radiological Exams on Admission: cxr pending  Assessment/Plan  79 yo female with fever, esrd dialysis status  Principal Problem:   UTI (lower urinary tract infection)-  Urine cx was negative from this past week.  Place on rocephin.  Ck cxr.  Active Problems:   Senile dementia   ESRD (end stage renal disease) on dialysis   Anemia of chronic disease  obs on med.  Presumptive full code.  DAVID,RACHAL A 06/11/2014, 10:26 PM

## 2014-06-11 NOTE — ED Notes (Signed)
Pt took two Tylenol roughly 1.5 hours ago.

## 2014-06-11 NOTE — ED Notes (Signed)
Pt c/o a fever. Pt was admitted on 06-07-14 for a uti. Now son sates pts fever has returned.

## 2014-06-12 LAB — CBC
HEMATOCRIT: 31.3 % — AB (ref 36.0–46.0)
HEMOGLOBIN: 10 g/dL — AB (ref 12.0–15.0)
MCH: 30 pg (ref 26.0–34.0)
MCHC: 31.9 g/dL (ref 30.0–36.0)
MCV: 94 fL (ref 78.0–100.0)
Platelets: 117 10*3/uL — ABNORMAL LOW (ref 150–400)
RBC: 3.33 MIL/uL — ABNORMAL LOW (ref 3.87–5.11)
RDW: 16.5 % — ABNORMAL HIGH (ref 11.5–15.5)
WBC: 4.1 10*3/uL (ref 4.0–10.5)

## 2014-06-12 LAB — BASIC METABOLIC PANEL
Anion gap: 10 (ref 5–15)
BUN: 27 mg/dL — ABNORMAL HIGH (ref 6–23)
CHLORIDE: 97 meq/L (ref 96–112)
CO2: 30 meq/L (ref 19–32)
Calcium: 8.1 mg/dL — ABNORMAL LOW (ref 8.4–10.5)
Creatinine, Ser: 3.92 mg/dL — ABNORMAL HIGH (ref 0.50–1.10)
GFR calc Af Amer: 11 mL/min — ABNORMAL LOW (ref >90)
GFR calc non Af Amer: 10 mL/min — ABNORMAL LOW (ref >90)
Glucose, Bld: 87 mg/dL (ref 70–99)
POTASSIUM: 4.3 meq/L (ref 3.7–5.3)
SODIUM: 137 meq/L (ref 137–147)

## 2014-06-12 MED ORDER — MEMANTINE HCL 10 MG PO TABS
20.0000 mg | ORAL_TABLET | Freq: Every day | ORAL | Status: DC
  Administered 2014-06-12 – 2014-06-14 (×3): 20 mg via ORAL
  Filled 2014-06-12 (×3): qty 2

## 2014-06-12 MED ORDER — VANCOMYCIN HCL IN DEXTROSE 1-5 GM/200ML-% IV SOLN
1000.0000 mg | Freq: Once | INTRAVENOUS | Status: AC
  Administered 2014-06-12: 1000 mg via INTRAVENOUS
  Filled 2014-06-12: qty 200

## 2014-06-12 MED ORDER — BIOTENE DRY MOUTH MT LIQD
15.0000 mL | Freq: Two times a day (BID) | OROMUCOSAL | Status: DC
  Administered 2014-06-12 – 2014-06-14 (×6): 15 mL via OROMUCOSAL

## 2014-06-12 NOTE — Consult Note (Signed)
Reason for Consult: End-stage renal disease Referring Physician: Dr. Gabriel Rung I [name] is an 80 y.o. female.  HPI: She is a patient who has history of hypertension, end-stage renal disease on maintenance hemodialysis, and recent history of urine tract infection presently brought by her family's after she was having some confusion and fever at home after coming from dialysis. Presently patient offers no complaints. She denies any difficulty breathing, no cough. Since patient has dementia most of the time she has difficulty in comprehension.  Past Medical History  Diagnosis Date  . Shortness of breath     with exertion  . Pneumonia 05/2013  . Mental disorder     dementia - short term memory  . History of blood transfusion     after fistula ruptured  . Dementia   . Renal disorder     dialysis MWF    Past Surgical History  Procedure Laterality Date  . Dialysis fistula creation    . Ligation of arteriovenous  fistula Left 06/04/2013    Procedure: LIGATION OF ARTERIOVENOUS  FISTULA;  Surgeon: Conrad [place], MD;  Location: [place];  Service: Vascular;  Laterality: Left;  . Insertion of dialysis catheter Right 06/04/2013    Procedure: INSERTION OF DIALYSIS CATHETER;  Surgeon: Conrad [place], MD;  Location: [place];  Service: Vascular;  Laterality: Right;  . Cholecystectomy  1992  . Av fistula placement Right 07/07/2013    Procedure: ARTERIOVENOUS (AV) FISTULA CREATION - RIGHT BRACHIAL CEPHALIC ;  Surgeon: Conrad Moquino, MD;  Location: [place];  Service: Vascular;  Laterality: Right;  . Removal of a dialysis catheter Right 11/09/2013    Procedure: REMOVAL OF A DIALYSIS CATHETER- Right TDC ;  Surgeon: Conrad La[place], MD;  Location: [place];  Service: Vascular;  Laterality: Right;  . Insertion of dialysis catheter Left 11/09/2013    Procedure: INSERTION OF DIALYSIS CATHETER- Left TDC;  Surgeon: Conrad , MD;  Location: [place];  Service: Vascular;  Laterality: Left;  . Exchange of a dialysis catheter  Left 12/17/2013    Procedure: EXCHANGE OF A DIALYSIS CATHETER;  Surgeon: Angelia Mould, MD;  Location: [place];  Service: Vascular;  Laterality: Left;  . Fistulogram Right 12/17/2013    Procedure: FISTULOGRAM- RIGHT ARM- POSSIBLE INTERVENTION;  Surgeon: Angelia Mould, MD;  Location: [place];  Service: Vascular;  Laterality: Right;  . Ligation of competing branches of arteriovenous fistula Right 12/17/2013    Procedure: LIGATION OF COMPETING BRANCHES OF ARTERIOVENOUS FISTULA;  Surgeon: Angelia Mould, MD;  Location: [place];  Service: Vascular;  Laterality: Right;  . Ligation of arteriovenous  fistula Right 01/19/2014    Procedure: LIGATION OF ARTERIOVENOUS  FISTULA- RIGHT BRACHIOCEPHALIC FISTULA;  Surgeon: Conrad , MD;  Location: [place];  Service: Vascular;  Laterality: Right;    History reviewed. No pertinent family history.  Social History:  reports that she has never smoked. She has never used smokeless tobacco. She reports that she does not drink alcohol or use illicit drugs.  Allergies:  Allergies  Allergen Reactions  . Sulfa Antibiotics Nausea And Vomiting and Rash  . Sulfur Nausea And Vomiting and Rash    Medications: I have reviewed the patient's current medications.  Results for orders placed during the hospital encounter of 06/11/14 (from the past 48 hour(s))  CBC WITH DIFFERENTIAL     Status: Abnormal   Collection Time    06/11/14  9:08 PM      Result Value  Ref Range   WBC 4.1  4.0 - 10.5 K/uL   RBC 3.26 (*) 3.87 - 5.11 MIL/uL   Hemoglobin 9.8 (*) 12.0 - 15.0 g/dL   HCT 30.1 (*) 36.0 - 46.0 %   MCV 92.3  78.0 - 100.0 fL   MCH 30.1  26.0 - 34.0 pg   MCHC 32.6  30.0 - 36.0 g/dL   RDW 16.5 (*) 11.5 - 15.5 %   Platelets 128 (*) 150 - 400 K/uL   Neutrophils Relative % 69  43 - 77 %   Neutro Abs 2.8  1.7 - 7.7 K/uL   Lymphocytes Relative 18  12 - 46 %   Lymphs Abs 0.7  0.7 - 4.0 K/uL   Monocytes Relative 7  3 - 12 %   Monocytes Absolute 0.3  0.1 - 1.0  K/uL   Eosinophils Relative 6 (*) 0 - 5 %   Eosinophils Absolute 0.3  0.0 - 0.7 K/uL   Basophils Relative 0  0 - 1 %   Basophils Absolute 0.0  0.0 - 0.1 K/uL  BASIC METABOLIC PANEL     Status: Abnormal   Collection Time    06/11/14  9:08 PM      Result Value Ref Range   Sodium 137  137 - 147 mEq/L   Potassium 4.0  3.7 - 5.3 mEq/L   Chloride 96  96 - 112 mEq/L   CO2 31  19 - 32 mEq/L   Glucose, Bld 141 (*) 70 - 99 mg/dL   BUN 21  6 - 23 mg/dL   Creatinine, Ser 3.23 (*) 0.50 - 1.10 mg/dL   Calcium 8.3 (*) 8.4 - 10.5 mg/dL   GFR calc non Af Amer 12 (*) >90 mL/min   GFR calc Af Amer 14 (*) >90 mL/min   Comment: (NOTE)     The eGFR has been calculated using the CKD EPI equation.     This calculation has not been validated in all clinical situations.     eGFR's persistently <90 mL/min signify possible Chronic Kidney     Disease.   Anion gap 10  5 - 15  URINALYSIS, ROUTINE W REFLEX MICROSCOPIC     Status: Abnormal   Collection Time    06/11/14  9:19 PM      Result Value Ref Range   Color, Urine AMBER (*) YELLOW   Comment: BIOCHEMICALS MAY BE AFFECTED BY COLOR   APPearance CLOUDY (*) CLEAR   Specific Gravity, Urine 1.020  1.005 - 1.030   pH 7.5  5.0 - 8.0   Glucose, UA NEGATIVE  NEGATIVE mg/dL   Hgb urine dipstick LARGE (*) NEGATIVE   Bilirubin Urine NEGATIVE  NEGATIVE   Ketones, ur NEGATIVE  NEGATIVE mg/dL   Protein, ur >300 (*) NEGATIVE mg/dL   Urobilinogen, UA 0.2  0.0 - 1.0 mg/dL   Nitrite NEGATIVE  NEGATIVE   Leukocytes, UA LARGE (*) NEGATIVE  URINE MICROSCOPIC-ADD ON     Status: Abnormal   Collection Time    06/11/14  9:19 PM      Result Value Ref Range   WBC, UA TOO NUMEROUS TO COUNT  <3 WBC/hpf   RBC / HPF TOO NUMEROUS TO COUNT  <3 RBC/hpf   Bacteria, UA MANY (*) RARE   Urine-Other MICROSCOPIC EXAM PERFORMED ON UNCONCENTRATED URINE    LACTIC ACID, PLASMA     Status: None   Collection Time    06/11/14 10:34 PM      Result Value  Ref Range   Lactic Acid, Venous 1.1   0.5 - 2.2 mmol/L  BASIC METABOLIC PANEL     Status: Abnormal   Collection Time    06/12/14  6:22 AM      Result Value Ref Range   Sodium 137  137 - 147 mEq/L   Potassium 4.3  3.7 - 5.3 mEq/L   Chloride 97  96 - 112 mEq/L   CO2 30  19 - 32 mEq/L   Glucose, Bld 87  70 - 99 mg/dL   BUN 27 (*) 6 - 23 mg/dL   Creatinine, Ser 3.92 (*) 0.50 - 1.10 mg/dL   Calcium 8.1 (*) 8.4 - 10.5 mg/dL   GFR calc non Af Amer 10 (*) >90 mL/min   GFR calc Af Amer 11 (*) >90 mL/min   Comment: (NOTE)     The eGFR has been calculated using the CKD EPI equation.     This calculation has not been validated in all clinical situations.     eGFR's persistently <90 mL/min signify possible Chronic Kidney     Disease.   Anion gap 10  5 - 15  CBC     Status: Abnormal   Collection Time    06/12/14  6:22 AM      Result Value Ref Range   WBC 4.1  4.0 - 10.5 K/uL   RBC 3.33 (*) 3.87 - 5.11 MIL/uL   Hemoglobin 10.0 (*) 12.0 - 15.0 g/dL   HCT 31.3 (*) 36.0 - 46.0 %   MCV 94.0  78.0 - 100.0 fL   MCH 30.0  26.0 - 34.0 pg   MCHC 31.9  30.0 - 36.0 g/dL   RDW 16.5 (*) 11.5 - 15.5 %   Platelets 117 (*) 150 - 400 K/uL   Comment: SPECIMEN CHECKED FOR CLOTS     PLATELET COUNT CONFIRMED BY SMEAR    Dg Chest Port 1 View  06/11/2014   CLINICAL DATA:  Fever.  Chronic kidney disease.  EXAM: PORTABLE CHEST - 1 VIEW  COMPARISON:  06/07/2014.  FINDINGS: 2231 hrs. The cardio pericardial silhouette is enlarged. Pulmonary edema pattern persists without substantial interval change. Left-sided HeRO catheter remains in place.  IMPRESSION: Cardiomegaly with pulmonary edema.   Electronically Signed   By: Misty Stanley M.D.   On: 06/11/2014 23:20    Review of Systems  Constitutional: Positive for fever and chills.  Respiratory: Negative for shortness of breath.   Cardiovascular: Negative for chest pain and orthopnea.  Gastrointestinal: Negative for nausea and vomiting.   Blood pressure 111/43, pulse 76, temperature 99.6 F (37.6 C),  temperature source Oral, resp. rate 18, height '5\' 3"'  (1.6 m), weight 55.248 kg (121 lb 12.8 oz), SpO2 98.00%. Physical Exam  Constitutional: No distress.  Eyes: No scleral icterus.  Neck: No JVD present.  Cardiovascular: Normal rate and regular rhythm.   No murmur heard. Respiratory: She has no wheezes.  Musculoskeletal: She exhibits no edema.    Assessment/Plan: Problem #1 end-stage renal disease she status post hemodialysis yesterday. Presently patient denies any uremic sign and symptoms. Problem #2 fever: Etiology as this moment not clear. Patient was admitted for urine tract infection the last time and was treated with antibiotics. Her culture results I most no growth. Presently still her urine is turbid possibly this could be secondary to recurrent urinary tract infection. Patient presently she has a graft she doesn't have any tenderness or swelling. Problem #3 hypertension her blood pressure is reasonably controlled Problem #4  history of dementia Problem #5 anemia: Her hemoglobin hematocrit is was in a target goal. Problem #6 metabolic bone disease: Her calcium is range. Plan: Agree with present treatment We'll add vancomycin  her antibiotics. Presently patient doesn't need any dialysis. If her fever persists possibly we may need to do echocardiogram. We'll check her CBC, phosphorus and basic metabolic panel in the morning.   Rayvon Dakin S 06/12/2014, 9:55 AM

## 2014-06-12 NOTE — Discharge Summary (Addendum)
Physician Discharge Summary  Patient ID: [name] MRN: [id number] DOB/AGE: 78/11/1930 78 y.o. Primary Care Physician:Kolbey Teichert L, MD Admit date: 06/07/2014 Discharge date: 06/09/2014    Discharge Diagnoses:   Active Problems:   CKD (chronic kidney disease) stage V requiring chronic dialysis   Volume overload   UTI (lower urinary tract infection)   Anemia of chronic disease   Dementia   Urinary tract infection     Medication List         donepezil 10 MG tablet  Commonly known as:  ARICEPT  Take 10 mg by mouth at bedtime.     multivitamin Tabs tablet  Take 1 tablet by mouth daily.     NAMENDA XR 28 MG Cp24  Generic drug:  Memantine HCl ER  Take 28 mg by mouth daily.     sevelamer carbonate 800 MG tablet  Commonly known as:  RENVELA  Take 800 mg by mouth 3 (three) times daily with meals.     torsemide 20 MG tablet  Commonly known as:  DEMADEX  Take 20 mg by mouth daily.        Discharged Condition: Improved    Consults: Nephrology  Significant Diagnostic Studies: Dg Chest 2 View  06/07/2014   CLINICAL DATA:  Fever today. History of dementia, of pneumonia and renal disorder of dialysis.  EXAM: CHEST  2 VIEW  COMPARISON:  04/24/2014 and 04/02/2014.  FINDINGS: 2217 hr. Left IJ dialysis catheter and left subclavian stent are grossly stable. There is cardiomegaly with aortic atherosclerosis. Pulmonary edema has worsened. There is chronic blunting of the left costophrenic angle which appears unchanged. No new airspace opacity is identified.  IMPRESSION: Worsening pulmonary edema consistent with congestive heart failure or volume overload. Stable blunting of the left costophrenic angle consistent with a chronic pleural effusion or pleural thickening.   Electronically Signed   By: Roxy HorsemanBill  Veazey M.D.   On: 06/07/2014 23:09   Dg Chest Port 1 View  06/11/2014   CLINICAL DATA:  Fever.  Chronic kidney disease.  EXAM: PORTABLE CHEST - 1 VIEW  COMPARISON:  06/07/2014.   FINDINGS: 2231 hrs. The cardio pericardial silhouette is enlarged. Pulmonary edema pattern persists without substantial interval change. Left-sided HeRO catheter remains in place.  IMPRESSION: Cardiomegaly with pulmonary edema.   Electronically Signed   By: Kennith CenterEric  Mansell M.D.   On: 06/11/2014 23:20    Lab Results: Basic Metabolic Panel:  Recent Labs  40/98/1107/24/15 2108 06/12/14 0622  NA 137 137  K 4.0 4.3  CL 96 97  CO2 31 30  GLUCOSE 141* 87  BUN 21 27*  CREATININE 3.23* 3.92*  CALCIUM 8.3* 8.1*   Liver Function Tests: No results found for this basename: AST, ALT, ALKPHOS, BILITOT, PROT, ALBUMIN,  in the last 72 hours   CBC:  Recent Labs  06/11/14 2108 06/12/14 0622  WBC 4.1 4.1  NEUTROABS 2.8  --   HGB 9.8* 10.0*  HCT 30.1* 31.3*  MCV 92.3 94.0  PLT 128* 117*    Recent Results (from the past 240 hour(s))  CULTURE, BLOOD (ROUTINE X 2)     Status: None   Collection Time    06/07/14  9:00 PM      Result Value Ref Range Status   Specimen Description BLOOD RIGHT WRIST   Final   Special Requests BOTTLES DRAWN AEROBIC AND ANAEROBIC 7CC   Final   Culture NO GROWTH 4 DAYS   Final   Report Status PENDING   Incomplete  URINE CULTURE     Status: None   Collection Time    06/07/14  9:57 PM      Result Value Ref Range Status   Specimen Description URINE, CATHETERIZED   Final   Special Requests NONE   Final   Culture  Setup Time     Final   Value: 06/08/2014 13:40     Performed at [facility] Count     Final   Value: NO GROWTH     Performed at [facility]   Culture     Final   Value: NO GROWTH     Performed at [facility]   Report Status 06/09/2014 FINAL   Final  CULTURE, BLOOD (ROUTINE X 2)     Status: None   Collection Time    06/07/14 10:02 PM      Result Value Ref Range Status   Specimen Description BLOOD RIGHT ARM   Final   Special Requests BOTTLES DRAWN AEROBIC AND ANAEROBIC 7CC   Final   Culture NO GROWTH 4 DAYS   Final    Report Status PENDING   Incomplete     Hospital Course: This is an 80 year old who had been having more trouble with fever and not being as responsive. This is typically from a urinary tract infection. We were attempting to get a urine specimen but she had already been started on Cipro. Since she continued to have trouble she was brought to the emergency department and was admitted from there. She was started on Zosyn. Urine culture from the emergency department showed no growth. After approximately 48 hours she was back at baseline and in fact seemed a bit better oriented than usual. Since there was no growth on her urine culture done in the emergency department it was felt that she probably had a urinary tract infection that was sensitive to Cipro and she was discharged on that.  Discharge Exam: Blood pressure 124/62, pulse 75, temperature 98 F (36.7 C), temperature source Oral, resp. rate 18, height 5\' 3"  (1.6 m), weight 53 kg (116 lb 13.5 oz), SpO2 96.00%. She is awake and alert. She is oriented. Her chest is clear.  Disposition: Home she will continue dialysis as an outpatient      Discharge Instructions   Discharge patient    Complete by:  As directed              Signed: Moishy Laday [name]   06/12/2014, 8:35 AM

## 2014-06-12 NOTE — Progress Notes (Addendum)
Subjective: She was readmitted with presumed urinary tract infection. She had been in the hospital last week with similar problem. Her urine had no growth so it was assumed that the Cipro that she had been placed on as an outpatient was effective for her urinary organism but simply was taking some time for her to respond. She is now on Rocephin IV. She says she feels better.  Objective: Vital signs in last 24 hours: Temp:  [98.3 F (36.8 C)-99.6 F (37.6 C)] 99.6 F (37.6 C) (07/25 0552) Pulse Rate:  [76-86] 76 (07/25 0552) Resp:  [18-20] 18 (07/25 0552) BP: (104-134)/(43-63) 111/43 mmHg (07/25 0552) SpO2:  [94 %-99 %] 98 % (07/25 0552) Weight:  [55.248 kg (121 lb 12.8 oz)] 55.248 kg (121 lb 12.8 oz) (07/24 2315) Weight change:  Last BM Date: 06/12/14  Intake/Output from previous day:    PHYSICAL EXAM General appearance: alert, cooperative and mild distress Resp: clear to auscultation bilaterally Cardio: regular rate and rhythm, S1, S2 normal, no murmur, click, rub or gallop GI: soft, non-tender; bowel sounds normal; no masses,  no organomegaly Extremities: extremities normal, atraumatic, no cyanosis or edema  Lab Results:  Results for orders placed during the hospital encounter of 06/11/14 (from the past 48 hour(s))  CBC WITH DIFFERENTIAL     Status: Abnormal   Collection Time    06/11/14  9:08 PM      Result Value Ref Range   WBC 4.1  4.0 - 10.5 K/uL   RBC 3.26 (*) 3.87 - 5.11 MIL/uL   Hemoglobin 9.8 (*) 12.0 - 15.0 g/dL   HCT 30.1 (*) 36.0 - 46.0 %   MCV 92.3  78.0 - 100.0 fL   MCH 30.1  26.0 - 34.0 pg   MCHC 32.6  30.0 - 36.0 g/dL   RDW 16.5 (*) 11.5 - 15.5 %   Platelets 128 (*) 150 - 400 K/uL   Neutrophils Relative % 69  43 - 77 %   Neutro Abs 2.8  1.7 - 7.7 K/uL   Lymphocytes Relative 18  12 - 46 %   Lymphs Abs 0.7  0.7 - 4.0 K/uL   Monocytes Relative 7  3 - 12 %   Monocytes Absolute 0.3  0.1 - 1.0 K/uL   Eosinophils Relative 6 (*) 0 - 5 %   Eosinophils  Absolute 0.3  0.0 - 0.7 K/uL   Basophils Relative 0  0 - 1 %   Basophils Absolute 0.0  0.0 - 0.1 K/uL  BASIC METABOLIC PANEL     Status: Abnormal   Collection Time    06/11/14  9:08 PM      Result Value Ref Range   Sodium 137  137 - 147 mEq/L   Potassium 4.0  3.7 - 5.3 mEq/L   Chloride 96  96 - 112 mEq/L   CO2 31  19 - 32 mEq/L   Glucose, Bld 141 (*) 70 - 99 mg/dL   BUN 21  6 - 23 mg/dL   Creatinine, Ser 3.23 (*) 0.50 - 1.10 mg/dL   Calcium 8.3 (*) 8.4 - 10.5 mg/dL   GFR calc non Af Amer 12 (*) >90 mL/min   GFR calc Af Amer 14 (*) >90 mL/min   Comment: (NOTE)     The eGFR has been calculated using the CKD EPI equation.     This calculation has not been validated in all clinical situations.     eGFR's persistently <90 mL/min signify possible Chronic Kidney  Disease.   Anion gap 10  5 - 15  URINALYSIS, ROUTINE W REFLEX MICROSCOPIC     Status: Abnormal   Collection Time    06/11/14  9:19 PM      Result Value Ref Range   Color, Urine AMBER (*) YELLOW   Comment: BIOCHEMICALS MAY BE AFFECTED BY COLOR   APPearance CLOUDY (*) CLEAR   Specific Gravity, Urine 1.020  1.005 - 1.030   pH 7.5  5.0 - 8.0   Glucose, UA NEGATIVE  NEGATIVE mg/dL   Hgb urine dipstick LARGE (*) NEGATIVE   Bilirubin Urine NEGATIVE  NEGATIVE   Ketones, ur NEGATIVE  NEGATIVE mg/dL   Protein, ur >300 (*) NEGATIVE mg/dL   Urobilinogen, UA 0.2  0.0 - 1.0 mg/dL   Nitrite NEGATIVE  NEGATIVE   Leukocytes, UA LARGE (*) NEGATIVE  URINE MICROSCOPIC-ADD ON     Status: Abnormal   Collection Time    06/11/14  9:19 PM      Result Value Ref Range   WBC, UA TOO NUMEROUS TO COUNT  <3 WBC/hpf   RBC / HPF TOO NUMEROUS TO COUNT  <3 RBC/hpf   Bacteria, UA MANY (*) RARE   Urine-Other MICROSCOPIC EXAM PERFORMED ON UNCONCENTRATED URINE    LACTIC ACID, PLASMA     Status: None   Collection Time    06/11/14 10:34 PM      Result Value Ref Range   Lactic Acid, Venous 1.1  0.5 - 2.2 mmol/L  BASIC METABOLIC PANEL     Status:  Abnormal   Collection Time    06/12/14  6:22 AM      Result Value Ref Range   Sodium 137  137 - 147 mEq/L   Potassium 4.3  3.7 - 5.3 mEq/L   Chloride 97  96 - 112 mEq/L   CO2 30  19 - 32 mEq/L   Glucose, Bld 87  70 - 99 mg/dL   BUN 27 (*) 6 - 23 mg/dL   Creatinine, Ser 3.92 (*) 0.50 - 1.10 mg/dL   Calcium 8.1 (*) 8.4 - 10.5 mg/dL   GFR calc non Af Amer 10 (*) >90 mL/min   GFR calc Af Amer 11 (*) >90 mL/min   Comment: (NOTE)     The eGFR has been calculated using the CKD EPI equation.     This calculation has not been validated in all clinical situations.     eGFR's persistently <90 mL/min signify possible Chronic Kidney     Disease.   Anion gap 10  5 - 15  CBC     Status: Abnormal   Collection Time    06/12/14  6:22 AM      Result Value Ref Range   WBC 4.1  4.0 - 10.5 K/uL   RBC 3.33 (*) 3.87 - 5.11 MIL/uL   Hemoglobin 10.0 (*) 12.0 - 15.0 g/dL   HCT 31.3 (*) 36.0 - 46.0 %   MCV 94.0  78.0 - 100.0 fL   MCH 30.0  26.0 - 34.0 pg   MCHC 31.9  30.0 - 36.0 g/dL   RDW 16.5 (*) 11.5 - 15.5 %   Platelets 117 (*) 150 - 400 K/uL   Comment: SPECIMEN CHECKED FOR CLOTS     PLATELET COUNT CONFIRMED BY SMEAR    ABGS No results found for this basename: PHART, PCO2, PO2ART, TCO2, HCO3,  in the last 72 hours CULTURES Recent Results (from the past 240 hour(s))  CULTURE, BLOOD (ROUTINE X 2)  Status: None   Collection Time    06/07/14  9:00 PM      Result Value Ref Range Status   Specimen Description BLOOD RIGHT WRIST   Final   Special Requests BOTTLES DRAWN AEROBIC AND ANAEROBIC [place]   Final   Culture NO GROWTH 4 DAYS   Final   Report Status PENDING   Incomplete  URINE CULTURE     Status: None   Collection Time    06/07/14  9:57 PM      Result Value Ref Range Status   Specimen Description URINE, CATHETERIZED   Final   Special Requests NONE   Final   Culture  Setup Time     Final   Value: 06/08/2014 13:40     Performed at [place]     Final   Value:  NO GROWTH     Performed at [facility]   Culture     Final   Value: NO GROWTH     Performed at [facility]   Report Status 06/09/2014 FINAL   Final  CULTURE, BLOOD (ROUTINE X 2)     Status: None   Collection Time    06/07/14 10:02 PM      Result Value Ref Range Status   Specimen Description BLOOD RIGHT ARM   Final   Special Requests BOTTLES DRAWN AEROBIC AND ANAEROBIC [place]   Final   Culture NO GROWTH 4 DAYS   Final   Report Status PENDING   Incomplete   Studies/Results: Dg Chest Port 1 View  06/11/2014   CLINICAL DATA:  Fever.  Chronic kidney disease.  EXAM: PORTABLE CHEST - 1 VIEW  COMPARISON:  06/07/2014.  FINDINGS: 2231 hrs. The cardio pericardial silhouette is enlarged. Pulmonary edema pattern persists without substantial interval change. Left-sided HeRO catheter remains in place.  IMPRESSION: Cardiomegaly with pulmonary edema.   Electronically Signed   By: Misty Stanley M.D.   On: 06/11/2014 23:20    Medications:  Prior to Admission:  Prescriptions prior to admission  Medication Sig Dispense Refill  . donepezil (ARICEPT) 10 MG tablet Take 10 mg by mouth at bedtime.      . Memantine HCl ER (NAMENDA XR) 28 MG CP24 Take 28 mg by mouth daily.      . multivitamin (RENA-VIT) TABS tablet Take 1 tablet by mouth daily.      . sevelamer carbonate (RENVELA) 800 MG tablet Take 800 mg by mouth 3 (three) times daily with meals.      . torsemide (DEMADEX) 20 MG tablet Take 20 mg by mouth daily.       Scheduled: . antiseptic oral rinse  15 mL Mouth Rinse BID  . cefTRIAXone (ROCEPHIN)  IV  1 g Intravenous Q24H  . donepezil  10 mg Oral QHS  . Memantine HCl ER  28 mg Oral Daily  . multivitamin  1 tablet Oral Daily  . sevelamer carbonate  800 mg Oral TID WC  . sodium chloride  3 mL Intravenous Q12H  . torsemide  20 mg Oral Daily   Continuous:  KMQ:KMMNOT chloride, acetaminophen, acetaminophen, sodium chloride  Assesment: She is presumed to have a urinary tract infection.  She has end-stage renal disease on dialysis. We don't have another source of infection but since she's on dialysis she certainly could have some sort of infection in the blood stream. Principal Problem:   UTI (lower urinary tract infection) Active Problems:   Senile dementia  ESRD (end stage renal disease) on dialysis   Anemia of chronic disease    Plan:continue rx. Discussed with Dr. Lowanda Foster. Add vancomycin empirically    LOS: 1 day   Shinika Estelle L 06/12/2014, 9:13 AM

## 2014-06-13 LAB — BASIC METABOLIC PANEL
Anion gap: 14 (ref 5–15)
BUN: 46 mg/dL — AB (ref 6–23)
CO2: 26 mEq/L (ref 19–32)
Calcium: 8.3 mg/dL — ABNORMAL LOW (ref 8.4–10.5)
Chloride: 97 mEq/L (ref 96–112)
Creatinine, Ser: 5.82 mg/dL — ABNORMAL HIGH (ref 0.50–1.10)
GFR calc non Af Amer: 6 mL/min — ABNORMAL LOW (ref >90)
GFR, EST AFRICAN AMERICAN: 7 mL/min — AB (ref >90)
GLUCOSE: 86 mg/dL (ref 70–99)
POTASSIUM: 4.4 meq/L (ref 3.7–5.3)
Sodium: 137 mEq/L (ref 137–147)

## 2014-06-13 LAB — CBC
HCT: 31.7 % — ABNORMAL LOW (ref 36.0–46.0)
Hemoglobin: 10.2 g/dL — ABNORMAL LOW (ref 12.0–15.0)
MCH: 29.9 pg (ref 26.0–34.0)
MCHC: 32.2 g/dL (ref 30.0–36.0)
MCV: 93 fL (ref 78.0–100.0)
PLATELETS: 136 10*3/uL — AB (ref 150–400)
RBC: 3.41 MIL/uL — AB (ref 3.87–5.11)
RDW: 16.5 % — ABNORMAL HIGH (ref 11.5–15.5)
WBC: 5.6 10*3/uL (ref 4.0–10.5)

## 2014-06-13 LAB — PHOSPHORUS: PHOSPHORUS: 5.5 mg/dL — AB (ref 2.3–4.6)

## 2014-06-13 MED ORDER — LIDOCAINE HCL (PF) 1 % IJ SOLN
INTRAMUSCULAR | Status: AC
  Administered 2014-06-13: 2.1 mL
  Filled 2014-06-13: qty 5

## 2014-06-13 MED ORDER — VANCOMYCIN HCL IN DEXTROSE 750-5 MG/150ML-% IV SOLN
750.0000 mg | INTRAVENOUS | Status: DC
  Administered 2014-06-14: 750 mg via INTRAVENOUS
  Filled 2014-06-13 (×2): qty 150

## 2014-06-13 MED ORDER — CEFTRIAXONE SODIUM 1 G IJ SOLR
1.0000 g | INTRAMUSCULAR | Status: DC
  Administered 2014-06-13 – 2014-06-14 (×2): 1 g via INTRAMUSCULAR
  Filled 2014-06-13 (×3): qty 10

## 2014-06-13 MED ORDER — CEFTRIAXONE SODIUM 1 G IJ SOLR
INTRAMUSCULAR | Status: AC
  Filled 2014-06-13: qty 10

## 2014-06-13 NOTE — Progress Notes (Signed)
Notified MD of increase in temperature and that the patient is "not feeling well" per patient.  Order blood cultures and will notify the family that we will continue the Rocephin and Vanc.  Patient received 650 mg of Tylenol and will continue to monitor patient.

## 2014-06-13 NOTE — Progress Notes (Addendum)
ANTIBIOTIC CONSULT NOTE - INITIAL  Pharmacy Consult for Vancomycin Indication: UTI and possible bacteremia (source unclear)  Allergies  Allergen Reactions  . Sulfa Antibiotics Nausea And Vomiting and Rash  . Sulfur Nausea And Vomiting and Rash   Patient Measurements: Height: 5\' 3"  (160 cm) Weight: 121 lb 12.8 oz (55.248 kg) IBW/kg (Calculated) : 52.4  Vital Signs: Temp: 98.7 F (37.1 C) (07/26 0507) Temp src: Oral (07/26 0507) BP: 134/64 mmHg (07/26 0507) Pulse Rate: 82 (07/26 0507) Intake/Output from previous day: 07/25 0701 - 07/26 0700 In: 340 [P.O.:240; IV Piggyback:100] Out: -  Intake/Output from this shift: Total I/O In: 120 [P.O.:120] Out: -   Labs:  Recent Labs  06/11/14 2108 06/12/14 0622 06/13/14 0545  WBC 4.1 4.1 5.6  HGB 9.8* 10.0* 10.2*  PLT 128* 117* 136*  CREATININE 3.23* 3.92* 5.82*   Estimated Creatinine Clearance: 6.3 ml/min (by C-G formula based on Cr of 5.82). No results found for this basename: VANCOTROUGH, VANCOPEAK, VANCORANDOM, GENTTROUGH, GENTPEAK, GENTRANDOM, TOBRATROUGH, TOBRAPEAK, TOBRARND, AMIKACINPEAK, AMIKACINTROU, AMIKACIN,  in the last 72 hours   Microbiology: Recent Results (from the past 720 hour(s))  CULTURE, BLOOD (ROUTINE X 2)     Status: None   Collection Time    06/07/14  9:00 PM      Result Value Ref Range Status   Specimen Description BLOOD RIGHT WRIST   Final   Special Requests BOTTLES DRAWN AEROBIC AND ANAEROBIC [facility]   Final   Culture NO GROWTH 4 DAYS   Final   Report Status PENDING   Incomplete  URINE CULTURE     Status: None   Collection Time    06/07/14  9:57 PM      Result Value Ref Range Status   Specimen Description URINE, CATHETERIZED   Final   Special Requests NONE   Final   Culture  Setup Time     Final   Value: 06/08/2014 13:40     Performed at [facility]     Final   Value: NO GROWTH     Performed at [facility]   Culture     Final   Value: NO GROWTH   Performed at [facility]   Report Status 06/09/2014 FINAL   Final  CULTURE, BLOOD (ROUTINE X 2)     Status: None   Collection Time    06/07/14 10:02 PM      Result Value Ref Range Status   Specimen Description BLOOD RIGHT ARM   Final   Special Requests BOTTLES DRAWN AEROBIC AND ANAEROBIC [facility]   Final   Culture NO GROWTH 4 DAYS   Final   Report Status PENDING   Incomplete    Medical History: Past Medical History  Diagnosis Date  . Shortness of breath     with exertion  . Pneumonia 05/2013  . Mental disorder     dementia - short term memory  . History of blood transfusion     after fistula ruptured  . Dementia   . Renal disorder     dialysis MWF   Rocephin 7/24 >> Vancomycin 7/25 >>  Assessment: 80yo female admitted with recurrent UTI.  D/W Dr Juanetta Gosling. Pt admitted with recurrent UTI / infection. Suspicious for bacteremia due to dialysis catheter. Plan is to empirically add Vancomycin to Rocephin Rx until better idea of source of infection.  Pt received Vancomycin 1000mg  IV yesterday.  Goal of Therapy:  Pre-Hemodialysis Vancomycin level goal range =15-25 mcg/ml  Plan:  Continue Rocephin per MD (no renal adjustment needed) Vancomycin 750mg  IV after each dialysis Monitor labs, progress, and cultures Check Vancomycin level when warranted  [name], [name] 06/13/2014,1:33 PM

## 2014-06-13 NOTE — Progress Notes (Signed)
Notified MD that patient lost IV access.  Per MD order, will not start a new IV and will give IM Rocephin.  Will continue to monitor patient.

## 2014-06-13 NOTE — Progress Notes (Signed)
Subjective: She says she feels okay. She has no new complaints. The source of her fever does not seem clear at this time  Objective: Vital signs in last 24 hours: Temp:  [98.7 F (37.1 C)-99.1 F (37.3 C)] 98.7 F (37.1 C) (07/26 0507) Pulse Rate:  [72-91] 82 (07/26 0507) Resp:  [14-18] 14 (07/26 0507) BP: (110-135)/(50-68) 134/64 mmHg (07/26 0507) SpO2:  [96 %-97 %] 96 % (07/26 0507) Weight change:  Last BM Date: 06/12/14  Intake/Output from previous day: 07/25 0701 - 07/26 0700 In: 340 [P.O.:240; IV Piggyback:100] Out: -   PHYSICAL EXAM General appearance: alert, cooperative and no distress Resp: clear to auscultation bilaterally Cardio: regular rate and rhythm, S1, S2 normal, no murmur, click, rub or gallop GI: soft, non-tender; bowel sounds normal; no masses,  no organomegaly Extremities: extremities normal, atraumatic, no cyanosis or edema  Lab Results:  Results for orders placed during the hospital encounter of 06/11/14 (from the past 48 hour(s))  CBC WITH DIFFERENTIAL     Status: Abnormal   Collection Time    06/11/14  9:08 PM      Result Value Ref Range   WBC 4.1  4.0 - 10.5 K/uL   RBC 3.26 (*) 3.87 - 5.11 MIL/uL   Hemoglobin 9.8 (*) 12.0 - 15.0 g/dL   HCT 30.1 (*) 36.0 - 46.0 %   MCV 92.3  78.0 - 100.0 fL   MCH 30.1  26.0 - 34.0 pg   MCHC 32.6  30.0 - 36.0 g/dL   RDW 16.5 (*) 11.5 - 15.5 %   Platelets 128 (*) 150 - 400 K/uL   Neutrophils Relative % 69  43 - 77 %   Neutro Abs 2.8  1.7 - 7.7 K/uL   Lymphocytes Relative 18  12 - 46 %   Lymphs Abs 0.7  0.7 - 4.0 K/uL   Monocytes Relative 7  3 - 12 %   Monocytes Absolute 0.3  0.1 - 1.0 K/uL   Eosinophils Relative 6 (*) 0 - 5 %   Eosinophils Absolute 0.3  0.0 - 0.7 K/uL   Basophils Relative 0  0 - 1 %   Basophils Absolute 0.0  0.0 - 0.1 K/uL  BASIC METABOLIC PANEL     Status: Abnormal   Collection Time    06/11/14  9:08 PM      Result Value Ref Range   Sodium 137  137 - 147 mEq/[name]   Potassium 4.0  3.7 -  5.3 mEq/[name]   Chloride 96  96 - 112 mEq/[name]   CO2 31  19 - 32 mEq/[name]   Glucose, Bld 141 (*) 70 - 99 mg/dL   BUN 21  6 - 23 mg/dL   Creatinine, Ser 3.23 (*) 0.50 - 1.10 mg/dL   Calcium 8.3 (*) 8.4 - 10.5 mg/dL   GFR calc non Af Amer 12 (*) >90 mL/min   GFR calc Af Amer 14 (*) >90 mL/min   Comment: (NOTE)     The eGFR has been calculated using the CKD EPI equation.     This calculation has not been validated in all clinical situations.     eGFR's persistently <90 mL/min signify possible Chronic Kidney     Disease.   Anion gap 10  5 - 15  URINALYSIS, ROUTINE W REFLEX MICROSCOPIC     Status: Abnormal   Collection Time    06/11/14  9:19 PM      Result Value Ref Range   Color, Urine AMBER (*)  YELLOW   Comment: BIOCHEMICALS MAY BE AFFECTED BY COLOR   APPearance CLOUDY (*) CLEAR   Specific Gravity, Urine 1.020  1.005 - 1.030   pH 7.5  5.0 - 8.0   Glucose, UA NEGATIVE  NEGATIVE mg/dL   Hgb urine dipstick LARGE (*) NEGATIVE   Bilirubin Urine NEGATIVE  NEGATIVE   Ketones, ur NEGATIVE  NEGATIVE mg/dL   Protein, ur >300 (*) NEGATIVE mg/dL   Urobilinogen, UA 0.2  0.0 - 1.0 mg/dL   Nitrite NEGATIVE  NEGATIVE   Leukocytes, UA LARGE (*) NEGATIVE  URINE MICROSCOPIC-ADD ON     Status: Abnormal   Collection Time    06/11/14  9:19 PM      Result Value Ref Range   WBC, UA TOO NUMEROUS TO COUNT  <3 WBC/hpf   RBC / HPF TOO NUMEROUS TO COUNT  <3 RBC/hpf   Bacteria, UA MANY (*) RARE   Urine-Other MICROSCOPIC EXAM PERFORMED ON UNCONCENTRATED URINE    LACTIC ACID, PLASMA     Status: None   Collection Time    06/11/14 10:34 PM      Result Value Ref Range   Lactic Acid, Venous 1.1  0.5 - 2.2 mmol/[name]  BASIC METABOLIC PANEL     Status: Abnormal   Collection Time    06/12/14  6:22 AM      Result Value Ref Range   Sodium 137  137 - 147 mEq/[name]   Potassium 4.3  3.7 - 5.3 mEq/[name]   Chloride 97  96 - 112 mEq/[name]   CO2 30  19 - 32 mEq/[name]   Glucose, Bld 87  70 - 99 mg/dL   BUN 27 (*) 6 - 23 mg/dL   Creatinine,  Ser 3.92 (*) 0.50 - 1.10 mg/dL   Calcium 8.1 (*) 8.4 - 10.5 mg/dL   GFR calc non Af Amer 10 (*) >90 mL/min   GFR calc Af Amer 11 (*) >90 mL/min   Comment: (NOTE)     The eGFR has been calculated using the CKD EPI equation.     This calculation has not been validated in all clinical situations.     eGFR's persistently <90 mL/min signify possible Chronic Kidney     Disease.   Anion gap 10  5 - 15  CBC     Status: Abnormal   Collection Time    06/12/14  6:22 AM      Result Value Ref Range   WBC 4.1  4.0 - 10.5 K/uL   RBC 3.33 (*) 3.87 - 5.11 MIL/uL   Hemoglobin 10.0 (*) 12.0 - 15.0 g/dL   HCT 31.3 (*) 36.0 - 46.0 %   MCV 94.0  78.0 - 100.0 fL   MCH 30.0  26.0 - 34.0 pg   MCHC 31.9  30.0 - 36.0 g/dL   RDW 16.5 (*) 11.5 - 15.5 %   Platelets 117 (*) 150 - 400 K/uL   Comment: SPECIMEN CHECKED FOR CLOTS     PLATELET COUNT CONFIRMED BY SMEAR  BASIC METABOLIC PANEL     Status: Abnormal   Collection Time    06/13/14  5:45 AM      Result Value Ref Range   Sodium 137  137 - 147 mEq/[name]   Potassium 4.4  3.7 - 5.3 mEq/[name]   Chloride 97  96 - 112 mEq/[name]   CO2 26  19 - 32 mEq/[name]   Glucose, Bld 86  70 - 99 mg/dL   BUN 46 (*) 6 - 23  mg/dL   Comment: DELTA CHECK NOTED   Creatinine, Ser 5.82 (*) 0.50 - 1.10 mg/dL   Calcium 8.3 (*) 8.4 - 10.5 mg/dL   GFR calc non Af Amer 6 (*) >90 mL/min   GFR calc Af Amer 7 (*) >90 mL/min   Comment: (NOTE)     The eGFR has been calculated using the CKD EPI equation.     This calculation has not been validated in all clinical situations.     eGFR's persistently <90 mL/min signify possible Chronic Kidney     Disease.   Anion gap 14  5 - 15  CBC     Status: Abnormal   Collection Time    06/13/14  5:45 AM      Result Value Ref Range   WBC 5.6  4.0 - 10.5 K/uL   RBC 3.41 (*) 3.87 - 5.11 MIL/uL   Hemoglobin 10.2 (*) 12.0 - 15.0 g/dL   HCT 31.7 (*) 36.0 - 46.0 %   MCV 93.0  78.0 - 100.0 fL   MCH 29.9  26.0 - 34.0 pg   MCHC 32.2  30.0 - 36.0 g/dL   RDW 16.5 (*)  11.5 - 15.5 %   Platelets 136 (*) 150 - 400 K/uL  PHOSPHORUS     Status: Abnormal   Collection Time    06/13/14  5:45 AM      Result Value Ref Range   Phosphorus 5.5 (*) 2.3 - 4.6 mg/dL    ABGS No results found for this basename: PHART, PCO2, PO2ART, TCO2, HCO3,  in the last 72 hours CULTURES Recent Results (from the past 240 hour(s))  CULTURE, BLOOD (ROUTINE X 2)     Status: None   Collection Time    06/07/14  9:00 PM      Result Value Ref Range Status   Specimen Description BLOOD RIGHT WRIST   Final   Special Requests BOTTLES DRAWN AEROBIC AND ANAEROBIC [place]   Final   Culture NO GROWTH 4 DAYS   Final   Report Status PENDING   Incomplete  URINE CULTURE     Status: None   Collection Time    06/07/14  9:57 PM      Result Value Ref Range Status   Specimen Description URINE, CATHETERIZED   Final   Special Requests NONE   Final   Culture  Setup Time     Final   Value: 06/08/2014 13:40     Performed at [place]     Final   Value: NO GROWTH     Performed at [facility]   Culture     Final   Value: NO GROWTH     Performed at [facility]   Report Status 06/09/2014 FINAL   Final  CULTURE, BLOOD (ROUTINE X 2)     Status: None   Collection Time    06/07/14 10:02 PM      Result Value Ref Range Status   Specimen Description BLOOD RIGHT ARM   Final   Special Requests BOTTLES DRAWN AEROBIC AND ANAEROBIC [place]   Final   Culture NO GROWTH 4 DAYS   Final   Report Status PENDING   Incomplete   Studies/Results: Dg Chest Port 1 View  06/11/2014   CLINICAL DATA:  Fever.  Chronic kidney disease.  EXAM: PORTABLE CHEST - 1 VIEW  COMPARISON:  06/07/2014.  FINDINGS: 2231 hrs. The cardio pericardial silhouette is enlarged. Pulmonary edema pattern persists without  substantial interval change. Left-sided HeRO catheter remains in place.  IMPRESSION: Cardiomegaly with pulmonary edema.   Electronically Signed   By: Misty Stanley M.D.   On: 06/11/2014 23:20     Medications:  Prior to Admission:  Prescriptions prior to admission  Medication Sig Dispense Refill  . donepezil (ARICEPT) 10 MG tablet Take 10 mg by mouth at bedtime.      . Memantine HCl ER (NAMENDA XR) 28 MG CP24 Take 28 mg by mouth daily.      . multivitamin (RENA-VIT) TABS tablet Take 1 tablet by mouth daily.      . sevelamer carbonate (RENVELA) 800 MG tablet Take 800 mg by mouth 3 (three) times daily with meals.      . torsemide (DEMADEX) 20 MG tablet Take 20 mg by mouth daily.       Scheduled: . antiseptic oral rinse  15 mL Mouth Rinse BID  . cefTRIAXone (ROCEPHIN)  IV  1 g Intravenous Q24H  . donepezil  10 mg Oral QHS  . memantine  20 mg Oral QHS  . multivitamin  1 tablet Oral Daily  . sevelamer carbonate  800 mg Oral TID WC  . sodium chloride  3 mL Intravenous Q12H  . torsemide  20 mg Oral Daily   Continuous:  ARE:QJEADG chloride, acetaminophen, acetaminophen, sodium chloride  Assesment: She was admitted with febrile illness presumably a urinary tract infection. She had been in the hospital earlier this week with similar problem but had no growth in her urine culture. She was doing well during dialysis and was seen by Dr. Lowanda Foster and was without complaints. However when she got home she developed fever and confusion. There is some concern that this may be related to infection in her dialysis catheter. Principal Problem:   UTI (lower urinary tract infection) Active Problems:   Senile dementia   ESRD (end stage renal disease) on dialysis   Anemia of chronic disease    Plan: I don't think at this point we know exactly why she's had trouble but she seems to be better on IV antibiotics. Await cultures.    LOS: 2 days   [name] 06/13/2014, 9:59 AM

## 2014-06-13 NOTE — Progress Notes (Signed)
Subjective: Interval History: has no complaint of nausea or vomiting. Patient denies also any difficulty in breathing..  Objective: Vital signs in last 24 hours: Temp:  [98.7 F (37.1 C)-99.1 F (37.3 C)] 98.7 F (37.1 C) (07/26 0507) Pulse Rate:  [72-91] 82 (07/26 0507) Resp:  [14-18] 14 (07/26 0507) BP: (110-135)/(50-68) 134/64 mmHg (07/26 0507) SpO2:  [96 %-97 %] 96 % (07/26 0507) Weight change:   Intake/Output from previous day: 07/25 0701 - 07/26 0700 In: 340 [P.O.:240; IV Piggyback:100] Out: -  Intake/Output this shift:    General appearance: alert, cooperative and no distress Resp: clear to auscultation bilaterally Cardio: regular rate and rhythm, S1, S2 normal, no murmur, click, rub or gallop GI: soft, non-tender; bowel sounds normal; no masses,  no organomegaly Extremities: extremities normal, atraumatic, no cyanosis or edema  Lab Results:  Recent Labs  06/12/14 0622 06/13/14 0545  WBC 4.1 5.6  HGB 10.0* 10.2*  HCT 31.3* 31.7*  PLT 117* 136*   BMET:  Recent Labs  06/12/14 0622 06/13/14 0545  NA 137 137  K 4.3 4.4  CL 97 97  CO2 30 26  GLUCOSE 87 86  BUN 27* 46*  CREATININE 3.92* 5.82*  CALCIUM 8.1* 8.3*   No results found for this basename: PTH,  in the last 72 hours Iron Studies: No results found for this basename: IRON, TIBC, TRANSFERRIN, FERRITIN,  in the last 72 hours  Studies/Results: Dg Chest Port 1 View  06/11/2014   CLINICAL DATA:  Fever.  Chronic kidney disease.  EXAM: PORTABLE CHEST - 1 VIEW  COMPARISON:  06/07/2014.  FINDINGS: 2231 hrs. The cardio pericardial silhouette is enlarged. Pulmonary edema pattern persists without substantial interval change. Left-sided HeRO catheter remains in place.  IMPRESSION: Cardiomegaly with pulmonary edema.   Electronically Signed   By: Kennith CenterEric  Mansell M.D.   On: 06/11/2014 23:20    I have reviewed the patient's current medications.  Assessment/Plan: Problem #1 end-stage renal disease she status post  hemodialysis on Friday. Presently patient does not have any nausea or vomiting. Her potassium is good Problem #2 anemia: Her hemoglobin hematocrit is in target range Problem #3 fever: Source of infection as this moment not clear. At present time thought to be secondary to recurrent urine tract infection. Blood culture is now no growth. Patient is on antibiotics and she is a febrile today with normal white cell count.  Problem #4 hypertension: Her blood pressure is reasonably controlled Problem #5 fluid management no sign of fluid overload. Patient however has history of pleural effusion Problem #6 metabolic bone disease: Her calcium is in range but her phosphorus is slightly high. Presently she is on a binder. Plan: We'll increase her Renvela to 800 mg 2 tablet by mouth 3 times a day with meals and one with snack. We'll make arrangements for patient to get dialysis tomorrow which is her regular schedule. We'll continue his present management.   LOS: 2 days   Ilija Maxim S 06/13/2014,7:54 AM

## 2014-06-14 DIAGNOSIS — I369 Nonrheumatic tricuspid valve disorder, unspecified: Secondary | ICD-10-CM

## 2014-06-14 LAB — BASIC METABOLIC PANEL
ANION GAP: 17 — AB (ref 5–15)
BUN: 66 mg/dL — ABNORMAL HIGH (ref 6–23)
CHLORIDE: 96 meq/L (ref 96–112)
CO2: 24 meq/L (ref 19–32)
Calcium: 8.4 mg/dL (ref 8.4–10.5)
Creatinine, Ser: 7.37 mg/dL — ABNORMAL HIGH (ref 0.50–1.10)
GFR calc non Af Amer: 5 mL/min — ABNORMAL LOW (ref >90)
GFR, EST AFRICAN AMERICAN: 5 mL/min — AB (ref >90)
Glucose, Bld: 92 mg/dL (ref 70–99)
Potassium: 4.9 mEq/L (ref 3.7–5.3)
Sodium: 137 mEq/L (ref 137–147)

## 2014-06-14 LAB — CULTURE, BLOOD (ROUTINE X 2)
CULTURE: NO GROWTH
Culture: NO GROWTH

## 2014-06-14 MED ORDER — DIAZEPAM 2 MG PO TABS
2.0000 mg | ORAL_TABLET | Freq: Once | ORAL | Status: AC
  Administered 2014-06-14: 2 mg via ORAL
  Filled 2014-06-14 (×2): qty 1

## 2014-06-14 MED ORDER — LIDOCAINE HCL (PF) 1 % IJ SOLN: 5.0000 mL | INTRAMUSCULAR | Status: DC | PRN

## 2014-06-14 MED ORDER — NEPRO/CARBSTEADY PO LIQD
237.0000 mL | ORAL | Status: DC | PRN
  Filled 2014-06-14: qty 237

## 2014-06-14 MED ORDER — HEPARIN SODIUM (PORCINE) 1000 UNIT/ML DIALYSIS
1000.0000 [IU] | INTRAMUSCULAR | Status: DC | PRN
Start: 2014-06-14 — End: 2014-06-14
  Filled 2014-06-14: qty 1

## 2014-06-14 MED ORDER — PENTAFLUOROPROP-TETRAFLUOROETH EX AERO
1.0000 "application " | INHALATION_SPRAY | CUTANEOUS | Status: DC | PRN
  Filled 2014-06-14: qty 103.5

## 2014-06-14 MED ORDER — LIDOCAINE-PRILOCAINE 2.5-2.5 % EX CREA: 1.0000 "application " | TOPICAL_CREAM | CUTANEOUS | Status: DC | PRN

## 2014-06-14 MED ORDER — SODIUM CHLORIDE 0.9 % IV SOLN: 100.0000 mL | INTRAVENOUS | Status: DC | PRN

## 2014-06-14 MED ORDER — ALTEPLASE 2 MG IJ SOLR
2.0000 mg | Freq: Once | INTRAMUSCULAR | Status: DC | PRN
  Filled 2014-06-14: qty 2

## 2014-06-14 NOTE — Progress Notes (Signed)
Subjective: Interval History: Patient offers no complaint. Denies any pain  Objective: Vital signs in last 24 hours: Temp:  [97.8 F (36.6 C)-100.9 F (38.3 C)] 97.8 F (36.6 C) (07/27 0641) Pulse Rate:  [86-96] 88 (07/27 0641) Resp:  [12-20] 17 (07/27 0641) BP: (129-137)/(64-68) 129/64 mmHg (07/27 0641) SpO2:  [93 %-95 %] 95 % (07/27 0641) Weight change:   Intake/Output from previous day: 07/26 0701 - 07/27 0700 In: 240 [P.O.:240] Out: -  Intake/Output this shift:    General appearance: alert, cooperative and no distress Resp: clear to auscultation bilaterally Cardio: regular rate and rhythm, S1, S2 normal, no murmur, click, rub or gallop GI: soft, non-tender; bowel sounds normal; no masses,  no organomegaly Extremities: extremities normal, atraumatic, no cyanosis or edema  Lab Results:  Recent Labs  06/12/14 0622 06/13/14 0545  WBC 4.1 5.6  HGB 10.0* 10.2*  HCT 31.3* 31.7*  PLT 117* 136*   BMET:   Recent Labs  06/13/14 0545 06/14/14 0530  NA 137 137  K 4.4 4.9  CL 97 96  CO2 26 24  GLUCOSE 86 92  BUN 46* 66*  CREATININE 5.82* 7.37*  CALCIUM 8.3* 8.4   No results found for this basename: PTH,  in the last 72 hours Iron Studies: No results found for this basename: IRON, TIBC, TRANSFERRIN, FERRITIN,  in the last 72 hours  Studies/Results: No results found.  I have reviewed the patient's current medications.  Assessment/Plan: Problem #1 end-stage renal disease she status post hemodialysis on Friday. Presently denies any symptoms of uremia Problem #2 anemia: Her hemoglobin hematocrit is in target range and stable Problem #3 fever: Source of infection as this moment not clear. At present time thought to be secondary to recurrent urine tract infection. Blood culture is  no growth. Patient is on antibiotics and she had episode of fever last night  Problem #4 hypertension: Her blood pressure is reasonably controlled Problem #5 fluid management no sign of  fluid overload. Patient however has history of pleural effusion Problem #6 metabolic bone disease: Her calcium is in range but her phosphorus is slightly high. Dose of her binder was increased Plan:We will make arrangement for dialysis today Possible doing echocardiogram may be reasonable Repeat blood culture during dialysis We'll continue his present management.   LOS: 3 days   [name] S 06/14/2014,7:48 AM

## 2014-06-14 NOTE — Progress Notes (Signed)
Subjective: She had more fever yesterday. She says she doesn't feel as well today. All cultures are negative so far.  Objective: Vital signs in last 24 hours: Temp:  [97.8 F (36.6 C)-100.9 F (38.3 C)] 97.8 F (36.6 C) (07/27 0641) Pulse Rate:  [86-96] 88 (07/27 0641) Resp:  [12-20] 17 (07/27 0641) BP: (129-137)/(64-68) 129/64 mmHg (07/27 0641) SpO2:  [93 %-95 %] 95 % (07/27 0641) Weight change:  Last BM Date: 06/12/14  Intake/Output from previous day: 07/26 0701 - 07/27 0700 In: 240 [P.O.:240] Out: -   PHYSICAL EXAM General appearance: alert, cooperative and Mildly confused Resp: clear to auscultation bilaterally Cardio: regular rate and rhythm, S1, S2 normal, no murmur, click, rub or gallop GI: soft, non-tender; bowel sounds normal; no masses,  no organomegaly Extremities: extremities normal, atraumatic, no cyanosis or edema Neurologically intact  Lab Results:  Results for orders placed during the hospital encounter of 06/11/14 (from the past 48 hour(s))  BASIC METABOLIC PANEL     Status: Abnormal   Collection Time    06/13/14  5:45 AM      Result Value Ref Range   Sodium 137  137 - 147 mEq/L   Potassium 4.4  3.7 - 5.3 mEq/L   Chloride 97  96 - 112 mEq/L   CO2 26  19 - 32 mEq/L   Glucose, Bld 86  70 - 99 mg/dL   BUN 46 (*) 6 - 23 mg/dL   Comment: DELTA CHECK NOTED   Creatinine, Ser 5.82 (*) 0.50 - 1.10 mg/dL   Calcium 8.3 (*) 8.4 - 10.5 mg/dL   GFR calc non Af Amer 6 (*) >90 mL/min   GFR calc Af Amer 7 (*) >90 mL/min   Comment: (NOTE)     The eGFR has been calculated using the CKD EPI equation.     This calculation has not been validated in all clinical situations.     eGFR's persistently <90 mL/min signify possible Chronic Kidney     Disease.   Anion gap 14  5 - 15  CBC     Status: Abnormal   Collection Time    06/13/14  5:45 AM      Result Value Ref Range   WBC 5.6  4.0 - 10.5 K/uL   RBC 3.41 (*) 3.87 - 5.11 MIL/uL   Hemoglobin 10.2 (*) 12.0 - 15.0 g/dL    HCT 31.7 (*) 36.0 - 46.0 %   MCV 93.0  78.0 - 100.0 fL   MCH 29.9  26.0 - 34.0 pg   MCHC 32.2  30.0 - 36.0 g/dL   RDW 16.5 (*) 11.5 - 15.5 %   Platelets 136 (*) 150 - 400 K/uL  PHOSPHORUS     Status: Abnormal   Collection Time    06/13/14  5:45 AM      Result Value Ref Range   Phosphorus 5.5 (*) 2.3 - 4.6 mg/dL  BASIC METABOLIC PANEL     Status: Abnormal   Collection Time    06/14/14  5:30 AM      Result Value Ref Range   Sodium 137  137 - 147 mEq/L   Potassium 4.9  3.7 - 5.3 mEq/L   Chloride 96  96 - 112 mEq/L   CO2 24  19 - 32 mEq/L   Glucose, Bld 92  70 - 99 mg/dL   BUN 66 (*) 6 - 23 mg/dL   Creatinine, Ser 7.37 (*) 0.50 - 1.10 mg/dL   Calcium 8.4  8.4 -  10.5 mg/dL   GFR calc non Af Amer 5 (*) >90 mL/min   GFR calc Af Amer 5 (*) >90 mL/min   Comment: (NOTE)     The eGFR has been calculated using the CKD EPI equation.     This calculation has not been validated in all clinical situations.     eGFR's persistently <90 mL/min signify possible Chronic Kidney     Disease.   Anion gap 17 (*) 5 - 15    ABGS No results found for this basename: PHART, PCO2, PO2ART, TCO2, HCO3,  in the last 72 hours CULTURES Recent Results (from the past 240 hour(s))  CULTURE, BLOOD (ROUTINE X 2)     Status: None   Collection Time    06/07/14  9:00 PM      Result Value Ref Range Status   Specimen Description BLOOD RIGHT WRIST   Final   Special Requests BOTTLES DRAWN AEROBIC AND ANAEROBIC [place]   Final   Culture NO GROWTH 4 DAYS   Final   Report Status PENDING   Incomplete  URINE CULTURE     Status: None   Collection Time    06/07/14  9:57 PM      Result Value Ref Range Status   Specimen Description URINE, CATHETERIZED   Final   Special Requests NONE   Final   Culture  Setup Time     Final   Value: 06/08/2014 13:40     Performed at [facility]     Final   Value: NO GROWTH     Performed at [facility]   Culture     Final   Value: NO GROWTH      Performed at [facility]   Report Status 06/09/2014 FINAL   Final  CULTURE, BLOOD (ROUTINE X 2)     Status: None   Collection Time    06/07/14 10:02 PM      Result Value Ref Range Status   Specimen Description BLOOD RIGHT ARM   Final   Special Requests BOTTLES DRAWN AEROBIC AND ANAEROBIC [place]   Final   Culture NO GROWTH 4 DAYS   Final   Report Status PENDING   Incomplete   Studies/Results: No results found.  Medications:  Prior to Admission:  Prescriptions prior to admission  Medication Sig Dispense Refill  . donepezil (ARICEPT) 10 MG tablet Take 10 mg by mouth at bedtime.      . Memantine HCl ER (NAMENDA XR) 28 MG CP24 Take 28 mg by mouth daily.      . multivitamin (RENA-VIT) TABS tablet Take 1 tablet by mouth daily.      . sevelamer carbonate (RENVELA) 800 MG tablet Take 800 mg by mouth 3 (three) times daily with meals.      . torsemide (DEMADEX) 20 MG tablet Take 20 mg by mouth daily.       Scheduled: . antiseptic oral rinse  15 mL Mouth Rinse BID  . cefTRIAXone (ROCEPHIN) IM  1 g Intramuscular Q24H  . donepezil  10 mg Oral QHS  . memantine  20 mg Oral QHS  . multivitamin  1 tablet Oral Daily  . sevelamer carbonate  800 mg Oral TID WC  . sodium chloride  3 mL Intravenous Q12H  . torsemide  20 mg Oral Daily  . vancomycin  750 mg Intravenous Q M,W,F-HD   Continuous:  ZOX:WRUEAV chloride, sodium chloride, sodium chloride, acetaminophen, acetaminophen, alteplase, feeding supplement (NEPRO CARB  STEADY), heparin, lidocaine (PF), lidocaine-prilocaine, pentafluoroprop-tetrafluoroeth, sodium chloride  Assesment: She was admitted with presumed urinary tract infection. She continues to have fever and all of her cultures are negative so far. Principal Problem:   UTI (lower urinary tract infection) Active Problems:   Senile dementia   ESRD (end stage renal disease) on dialysis   Anemia of chronic disease    Plan: Continue IV antibiotics. Echo ordered    LOS: 3 days    Nike Southwell L 06/14/2014, 8:12 AM

## 2014-06-14 NOTE — Progress Notes (Signed)
  Echocardiogram 2D Echocardiogram has been performed.  Reyanne Hussar 06/14/2014, 11:34 AM

## 2014-06-14 NOTE — Progress Notes (Signed)
Patient taken to dialysis via bed. Valium given before transport as ordered.

## 2014-06-15 ENCOUNTER — Inpatient Hospital Stay: Payer: Self-pay | Admitting: Internal Medicine

## 2014-06-15 LAB — BASIC METABOLIC PANEL
Anion gap: 11 (ref 5–15)
BUN: 36 mg/dL — AB (ref 6–23)
CO2: 30 meq/L (ref 19–32)
Calcium: 8.1 mg/dL — ABNORMAL LOW (ref 8.4–10.5)
Chloride: 100 mEq/L (ref 96–112)
Creatinine, Ser: 5.15 mg/dL — ABNORMAL HIGH (ref 0.50–1.10)
GFR calc Af Amer: 8 mL/min — ABNORMAL LOW (ref >90)
GFR calc non Af Amer: 7 mL/min — ABNORMAL LOW (ref >90)
GLUCOSE: 86 mg/dL (ref 70–99)
POTASSIUM: 4.5 meq/L (ref 3.7–5.3)
Sodium: 141 mEq/L (ref 137–147)

## 2014-06-15 LAB — COMPREHENSIVE METABOLIC PANEL
ALBUMIN: 2.3 g/dL — AB (ref 3.4–5.0)
ALT: 11 U/L — AB
ANION GAP: 8 (ref 7–16)
Alkaline Phosphatase: 112 U/L
BUN: 47 mg/dL — ABNORMAL HIGH (ref 7–18)
Bilirubin,Total: 0.4 mg/dL (ref 0.2–1.0)
CHLORIDE: 99 mmol/L (ref 98–107)
Calcium, Total: 8.1 mg/dL — ABNORMAL LOW (ref 8.5–10.1)
Co2: 30 mmol/L (ref 21–32)
Creatinine: 6.1 mg/dL — ABNORMAL HIGH (ref 0.60–1.30)
EGFR (African American): 7 — ABNORMAL LOW
EGFR (Non-African Amer.): 6 — ABNORMAL LOW
Glucose: 110 mg/dL — ABNORMAL HIGH (ref 65–99)
OSMOLALITY: 287 (ref 275–301)
Potassium: 4.5 mmol/L (ref 3.5–5.1)
SGOT(AST): 16 U/L (ref 15–37)
Sodium: 137 mmol/L (ref 136–145)
Total Protein: 7.2 g/dL (ref 6.4–8.2)

## 2014-06-15 LAB — GLUCOSE, CAPILLARY
Glucose-Capillary: 95 mg/dL (ref 70–99)
Glucose-Capillary: 90 mg/dL (ref 70–99)

## 2014-06-15 LAB — CBC
HEMATOCRIT: 29 % — AB (ref 36.0–46.0)
HEMOGLOBIN: 9.3 g/dL — AB (ref 12.0–15.0)
MCH: 29.8 pg (ref 26.0–34.0)
MCHC: 32.1 g/dL (ref 30.0–36.0)
MCV: 92.9 fL (ref 78.0–100.0)
Platelets: 127 10*3/uL — ABNORMAL LOW (ref 150–400)
RBC: 3.12 MIL/uL — AB (ref 3.87–5.11)
RDW: 16.5 % — ABNORMAL HIGH (ref 11.5–15.5)
WBC: 5.4 10*3/uL (ref 4.0–10.5)

## 2014-06-15 LAB — CBC WITH DIFFERENTIAL/PLATELET
BASOS PCT: 1 %
Basophil #: 0.1 10*3/uL (ref 0.0–0.1)
Eosinophil #: 0.6 10*3/uL (ref 0.0–0.7)
Eosinophil %: 10.2 %
HCT: 31.5 % — ABNORMAL LOW (ref 35.0–47.0)
HGB: 10.3 g/dL — AB (ref 12.0–16.0)
LYMPHS ABS: 0.7 10*3/uL — AB (ref 1.0–3.6)
Lymphocyte %: 12.3 %
MCH: 30.8 pg (ref 26.0–34.0)
MCHC: 32.8 g/dL (ref 32.0–36.0)
MCV: 94 fL (ref 80–100)
MONO ABS: 0.4 x10 3/mm (ref 0.2–0.9)
Monocyte %: 6.8 %
NEUTROS ABS: 3.8 10*3/uL (ref 1.4–6.5)
Neutrophil %: 69.7 %
Platelet: 138 10*3/uL — ABNORMAL LOW (ref 150–440)
RBC: 3.36 10*6/uL — ABNORMAL LOW (ref 3.80–5.20)
RDW: 16.3 % — ABNORMAL HIGH (ref 11.5–14.5)
WBC: 5.4 10*3/uL (ref 3.6–11.0)

## 2014-06-15 NOTE — Procedures (Signed)
   HEMODIALYSIS TREATMENT NOTE:  3.5 hour heparin-free dialysis completed via left upper arm AVG (16g ante/retrograde).  Goal met:  Tolerated removal of 2.6 liters without interruption in ultrafiltration.  All blood was reinfused and hemostasis was achieved within 15 minutes.  Report given to [place], Therapist, sports.  Roxi Hlavaty L. Regina Ganci, RN, CDN

## 2014-06-15 NOTE — Progress Notes (Signed)
Subjective: She had significant fever yesterday after dialysis. I discussed her situation with Dr. Michel Bickers infectious disease specialist in [place] and he is concerned as are Dr. Lowanda Foster and myself that she has an infection in her dialysis access. We do not have any positive blood cultures yet. We don't have another source of infection however. Temporally it seems related because she develops fever after dialysis  Objective: Vital signs in last 24 hours: Temp:  [97.6 F (36.4 C)-102.9 F (39.4 C)] 97.6 F (36.4 C) (07/28 4825) Pulse Rate:  [63-102] 63 (07/28 0613) Resp:  [16-18] 16 (07/28 0613) BP: (110-140)/(52-83) 110/52 mmHg (07/28 0613) SpO2:  [92 %-95 %] 95 % (07/28 0613) Weight:  [57.199 kg (126 lb 1.6 oz)-61.4 kg (135 lb 5.8 oz)] 61.4 kg (135 lb 5.8 oz) (07/27 2030) Weight change:  Last BM Date: 06/12/14  Intake/Output from previous day: 07/27 0701 - 07/28 0700 In: 240 [P.O.:240] Out: 2600   PHYSICAL EXAM General appearance: alert, cooperative and Appears weak Resp: clear to auscultation bilaterally Cardio: regular rate and rhythm, S1, S2 normal, no murmur, click, rub or gallop GI: soft, non-tender; bowel sounds normal; no masses,  no organomegaly Extremities: extremities normal, atraumatic, no cyanosis or edema  Lab Results:  Results for orders placed during the hospital encounter of 06/11/14 (from the past 48 hour(s))  CULTURE, BLOOD (ROUTINE X 2)     Status: None   Collection Time    06/13/14  7:41 PM      Result Value Ref Range   Specimen Description BLOOD RIGHT FOREARM     Special Requests BOTTLES DRAWN AEROBIC AND ANAEROBIC 12CC     Culture NO GROWTH 1 DAY     Report Status PENDING    CULTURE, BLOOD (ROUTINE X 2)     Status: None   Collection Time    06/13/14  7:53 PM      Result Value Ref Range   Specimen Description BLOOD RIGHT HAND     Special Requests BOTTLES DRAWN AEROBIC AND ANAEROBIC 8CC     Culture NO GROWTH 1 DAY     Report Status PENDING     BASIC METABOLIC PANEL     Status: Abnormal   Collection Time    06/14/14  5:30 AM      Result Value Ref Range   Sodium 137  137 - 147 mEq/L   Potassium 4.9  3.7 - 5.3 mEq/L   Chloride 96  96 - 112 mEq/L   CO2 24  19 - 32 mEq/L   Glucose, Bld 92  70 - 99 mg/dL   BUN 66 (*) 6 - 23 mg/dL   Creatinine, Ser 7.37 (*) 0.50 - 1.10 mg/dL   Calcium 8.4  8.4 - 10.5 mg/dL   GFR calc non Af Amer 5 (*) >90 mL/min   GFR calc Af Amer 5 (*) >90 mL/min   Comment: (NOTE)     The eGFR has been calculated using the CKD EPI equation.     This calculation has not been validated in all clinical situations.     eGFR's persistently <90 mL/min signify possible Chronic Kidney     Disease.   Anion gap 17 (*) 5 - 15  GLUCOSE, CAPILLARY     Status: None   Collection Time    06/14/14 10:12 PM      Result Value Ref Range   Glucose-Capillary 95  70 - 99 mg/dL   Comment 1 Notify RN    GLUCOSE, CAPILLARY  Status: None   Collection Time    06/15/14  2:10 AM      Result Value Ref Range   Glucose-Capillary 90  70 - 99 mg/dL   Comment 1 Notify RN    BASIC METABOLIC PANEL     Status: Abnormal   Collection Time    06/15/14  5:36 AM      Result Value Ref Range   Sodium 141  137 - 147 mEq/L   Potassium 4.5  3.7 - 5.3 mEq/L   Chloride 100  96 - 112 mEq/L   CO2 30  19 - 32 mEq/L   Glucose, Bld 86  70 - 99 mg/dL   BUN 36 (*) 6 - 23 mg/dL   Comment: DELTA CHECK NOTED   Creatinine, Ser 5.15 (*) 0.50 - 1.10 mg/dL   Calcium 8.1 (*) 8.4 - 10.5 mg/dL   GFR calc non Af Amer 7 (*) >90 mL/min   GFR calc Af Amer 8 (*) >90 mL/min   Comment: (NOTE)     The eGFR has been calculated using the CKD EPI equation.     This calculation has not been validated in all clinical situations.     eGFR's persistently <90 mL/min signify possible Chronic Kidney     Disease.   Anion gap 11  5 - 15  CBC     Status: Abnormal   Collection Time    06/15/14  5:36 AM      Result Value Ref Range   WBC 5.4  4.0 - 10.5 K/uL   RBC 3.12  (*) 3.87 - 5.11 MIL/uL   Hemoglobin 9.3 (*) 12.0 - 15.0 g/dL   HCT 29.0 (*) 36.0 - 46.0 %   MCV 92.9  78.0 - 100.0 fL   MCH 29.8  26.0 - 34.0 pg   MCHC 32.1  30.0 - 36.0 g/dL   RDW 16.5 (*) 11.5 - 15.5 %   Platelets 127 (*) 150 - 400 K/uL    ABGS No results found for this basename: PHART, PCO2, PO2ART, TCO2, HCO3,  in the last 72 hours CULTURES Recent Results (from the past 240 hour(s))  CULTURE, BLOOD (ROUTINE X 2)     Status: None   Collection Time    06/07/14  9:00 PM      Result Value Ref Range Status   Specimen Description BLOOD RIGHT WRIST   Final   Special Requests BOTTLES DRAWN AEROBIC AND ANAEROBIC [place]   Final   Culture NO GROWTH 7 DAYS   Final   Report Status 06/14/2014 FINAL   Final  URINE CULTURE     Status: None   Collection Time    06/07/14  9:57 PM      Result Value Ref Range Status   Specimen Description URINE, CATHETERIZED   Final   Special Requests NONE   Final   Culture  Setup Time     Final   Value: 06/08/2014 13:40     Performed at [facility]     Final   Value: NO GROWTH     Performed at [facility]   Culture     Final   Value: NO GROWTH     Performed at [facility]   Report Status 06/09/2014 FINAL   Final  CULTURE, BLOOD (ROUTINE X 2)     Status: None   Collection Time    06/07/14 10:02 PM      Result Value Ref Range Status  Specimen Description BLOOD RIGHT ARM   Final   Special Requests BOTTLES DRAWN AEROBIC AND ANAEROBIC [place]   Final   Culture NO GROWTH 7 DAYS   Final   Report Status 06/14/2014 FINAL   Final  CULTURE, BLOOD (ROUTINE X 2)     Status: None   Collection Time    06/11/14  9:30 PM      Result Value Ref Range Status   Specimen Description BLOOD RIGHT HAND   Final   Special Requests BOTTLES DRAWN AEROBIC AND ANAEROBIC 8CC   Final   Culture NO GROWTH 3 DAYS   Final   Report Status PENDING   Incomplete  CULTURE, BLOOD (ROUTINE X 2)     Status: None   Collection Time    06/11/14  9:30 PM       Result Value Ref Range Status   Specimen Description BLOOD RIGHT ARM   Final   Special Requests BOTTLES DRAWN AEROBIC ONLY 8CC   Final   Culture NO GROWTH 3 DAYS   Final   Report Status PENDING   Incomplete  CULTURE, BLOOD (ROUTINE X 2)     Status: None   Collection Time    06/13/14  7:41 PM      Result Value Ref Range Status   Specimen Description BLOOD RIGHT FOREARM   Final   Special Requests BOTTLES DRAWN AEROBIC AND ANAEROBIC 12CC   Final   Culture NO GROWTH 1 DAY   Final   Report Status PENDING   Incomplete  CULTURE, BLOOD (ROUTINE X 2)     Status: None   Collection Time    06/13/14  7:53 PM      Result Value Ref Range Status   Specimen Description BLOOD RIGHT HAND   Final   Special Requests BOTTLES DRAWN AEROBIC AND ANAEROBIC 8CC   Final   Culture NO GROWTH 1 DAY   Final   Report Status PENDING   Incomplete   Studies/Results: No results found.  Medications:  Prior to Admission:  Prescriptions prior to admission  Medication Sig Dispense Refill  . donepezil (ARICEPT) 10 MG tablet Take 10 mg by mouth at bedtime.      . Memantine HCl ER (NAMENDA XR) 28 MG CP24 Take 28 mg by mouth daily.      . multivitamin (RENA-VIT) TABS tablet Take 1 tablet by mouth daily.      . sevelamer carbonate (RENVELA) 800 MG tablet Take 800 mg by mouth 3 (three) times daily with meals.      . torsemide (DEMADEX) 20 MG tablet Take 20 mg by mouth daily.       Scheduled: . antiseptic oral rinse  15 mL Mouth Rinse BID  . cefTRIAXone (ROCEPHIN) IM  1 g Intramuscular Q24H  . donepezil  10 mg Oral QHS  . memantine  20 mg Oral QHS  . multivitamin  1 tablet Oral Daily  . sevelamer carbonate  800 mg Oral TID WC  . sodium chloride  3 mL Intravenous Q12H  . torsemide  20 mg Oral Daily  . vancomycin  750 mg Intravenous Q M,W,F-HD   Continuous:  KVQ:QVZDGL chloride, acetaminophen, acetaminophen, sodium chloride  Assesment: She was admitted with febrile illness. He was thought to be related to  urinary tract infection but unfortunately she did not have urine culture done at the time of admission. Her fever seems to happen after each dialysis which is concerning that she may have infection in her dialysis access. She has  dementia which is unchanged. She has chronic renal failure on dialysis. Echocardiogram showed markedly diminished ejection fraction Principal Problem:   UTI (lower urinary tract infection) Active Problems:   Senile dementia   ESRD (end stage renal disease) on dialysis   Anemia of chronic disease    Plan: She may need to be transferred to have dialysis access removed.    LOS: 4 days   Aribella Vavra L 06/15/2014, 8:49 AM

## 2014-06-15 NOTE — Care Management Note (Signed)
    Page 1 of 1   06/15/2014     11:27:16 AM CARE MANAGEMENT NOTE 06/15/2014  Patient:  Luvenia StarchBEVILL,Tujuana I   Account Number:  [id number]  Date Initiated:  06/15/2014  Documentation initiated by:  Sharrie RothmanBLACKWELL,Efrata Brunner C  Subjective/Objective Assessment:   Pt admitted from home with fever. Pt has paid caregivers during the day and son stays with pt at night. Pt requires max assistance with ADL's. Pt has a walker for home use.     Action/Plan:   [facility] for continued care for possible dialysis catheter. PT consult ordered. Will continue to follow.   Anticipated DC Date:  06/18/2014   Anticipated DC Plan:  HOME W HOME HEALTH SERVICES      DC Planning Services  CM consult      Choice offered to / List presented to:             Status of service:  In process, will continue to follow Medicare Important Message given?   (If response is "NO", the following Medicare IM given date fields will be blank) Date Medicare IM given:   Medicare IM given by:   Date Additional Medicare IM given:   Additional Medicare IM given by:    Discharge Disposition:    Per UR Regulation:    If discussed at Long Length of Stay Meetings, dates discussed:    Comments:  06/15/14 1120 Arlyss Queenammy Byrant Valent, RN BSN CM

## 2014-06-15 NOTE — Plan of Care (Signed)
Pt being transferred to [facility] via carelink to receive possible surgery from Vascular surgery. Pt will be going to room 209 and report was called to Magdalene PatriciaGwynn Yates, RN.  Family also told of transfer.

## 2014-06-15 NOTE — Progress Notes (Addendum)
Subjective: Interval History: Patient sleepy but arousable . No complaint  Objective: Vital signs in last 24 hours: Temp:  [97.6 F (36.4 C)-102.9 F (39.4 C)] 97.6 F (36.4 C) (07/28 16100613) Pulse Rate:  [63-102] 63 (07/28 0613) Resp:  [16-18] 16 (07/28 0613) BP: (110-140)/(52-83) 110/52 mmHg (07/28 0613) SpO2:  [92 %-95 %] 95 % (07/28 0613) Weight:  [57.199 kg (126 lb 1.6 oz)-61.4 kg (135 lb 5.8 oz)] 61.4 kg (135 lb 5.8 oz) (07/27 2030) Weight change:   Intake/Output from previous day: 07/27 0701 - 07/28 0700 In: 240 [P.O.:240] Out: 2600  Intake/Output this shift:    General appearance: alert, cooperative and no distress Resp: clear to auscultation bilaterally Cardio: regular rate and rhythm, S1, S2 normal, no murmur, click, rub or gallop GI: soft, non-tender; bowel sounds normal; no masses,  no organomegaly Extremities: extremities normal, atraumatic, no cyanosis or edema  Lab Results:  Recent Labs  06/13/14 0545 06/15/14 0536  WBC 5.6 5.4  HGB 10.2* 9.3*  HCT 31.7* 29.0*  PLT 136* 127*   BMET:   Recent Labs  06/14/14 0530 06/15/14 0536  NA 137 141  K 4.9 4.5  CL 96 100  CO2 24 30  GLUCOSE 92 86  BUN 66* 36*  CREATININE 7.37* 5.15*  CALCIUM 8.4 8.1*   No results found for this basename: PTH,  in the last 72 hours Iron Studies: No results found for this basename: IRON, TIBC, TRANSFERRIN, FERRITIN,  in the last 72 hours  Studies/Results: No results found.  I have reviewed the patient's current medications.  Assessment/Plan: Problem #1 end-stage renal disease she status post hemodialysis yesterday. Her potassium is in range Problem #2 anemia: Her hemoglobin hematocrit is below target Problem #3 fever: Continue to be febrile.  Source of infection as this moment not clear. At present time thought to be secondary to recurrent urine tract infection. Blood culture is  no growth. Patient is on antibiotics and her echo no vegtation. Graft? Problem #4  hypertension: Her blood pressure is reasonably controlled Problem #5 fluid management no sign of fluid overload.  Problem #6 metabolic bone disease: Her calcium is in range but her phosphorus is slightly high. On a binder Plan:We will make arrangement for dialysis for tomorrow Agree with getting some inmput from ID. Possibly vascular surgery consult    LOS: 4 days   Edynn Gillock S 06/15/2014,8:00 AM

## 2014-06-16 LAB — CBC WITH DIFFERENTIAL/PLATELET
Basophil #: 0 10*3/uL (ref 0.0–0.1)
Basophil %: 0.9 %
EOS ABS: 0.6 10*3/uL (ref 0.0–0.7)
Eosinophil %: 11.6 %
HCT: 28 % — ABNORMAL LOW (ref 35.0–47.0)
HGB: 8.9 g/dL — AB (ref 12.0–16.0)
LYMPHS PCT: 18.5 %
Lymphocyte #: 0.9 10*3/uL — ABNORMAL LOW (ref 1.0–3.6)
MCH: 29.9 pg (ref 26.0–34.0)
MCHC: 31.8 g/dL — AB (ref 32.0–36.0)
MCV: 94 fL (ref 80–100)
MONO ABS: 0.3 x10 3/mm (ref 0.2–0.9)
Monocyte %: 6.2 %
NEUTROS ABS: 3.2 10*3/uL (ref 1.4–6.5)
Neutrophil %: 62.8 %
Platelet: 139 10*3/uL — ABNORMAL LOW (ref 150–440)
RBC: 2.98 10*6/uL — ABNORMAL LOW (ref 3.80–5.20)
RDW: 16.3 % — AB (ref 11.5–14.5)
WBC: 5 10*3/uL (ref 3.6–11.0)

## 2014-06-16 LAB — VANCOMYCIN, TROUGH: Vancomycin, Trough: 27 ug/mL (ref 10–20)

## 2014-06-16 LAB — BASIC METABOLIC PANEL
Anion Gap: 10 (ref 7–16)
BUN: 54 mg/dL — ABNORMAL HIGH (ref 7–18)
CALCIUM: 8.3 mg/dL — AB (ref 8.5–10.1)
CO2: 26 mmol/L (ref 21–32)
Chloride: 102 mmol/L (ref 98–107)
Creatinine: 6.7 mg/dL — ABNORMAL HIGH (ref 0.60–1.30)
EGFR (African American): 6 — ABNORMAL LOW
GFR CALC NON AF AMER: 5 — AB
Glucose: 86 mg/dL (ref 65–99)
OSMOLALITY: 290 (ref 275–301)
POTASSIUM: 4.7 mmol/L (ref 3.5–5.1)
Sodium: 138 mmol/L (ref 136–145)

## 2014-06-16 LAB — CULTURE, BLOOD (ROUTINE X 2)
Culture: NO GROWTH
Culture: NO GROWTH

## 2014-06-16 LAB — PHOSPHORUS: Phosphorus: 6.2 mg/dL — ABNORMAL HIGH (ref 2.5–4.9)

## 2014-06-16 NOTE — Progress Notes (Signed)
UR chart review completed.  

## 2014-06-19 LAB — CULTURE, BLOOD (ROUTINE X 2)
Culture: NO GROWTH
Culture: NO GROWTH

## 2014-06-19 NOTE — Discharge Summary (Addendum)
Physician Discharge Summary  Patient ID: [name] MRN: [id number] DOB/AGE: 1931-07-17 80 y.o. Primary Care Physician:Ave Scharnhorst L, MD Admit date: 06/11/2014 Discharge date: 06/15/2014    Discharge Diagnoses:   Principal Problem:   UTI (lower urinary tract infection) Active Problems:   CKD (chronic kidney disease) stage V requiring chronic dialysis   Senile dementia   ESRD (end stage renal disease) on dialysis   Anemia of chronic disease  possible infection of dialysis access    Medication List    ASK your doctor about these medications       donepezil 10 MG tablet  Commonly known as:  ARICEPT  Take 10 mg by mouth at bedtime.     multivitamin Tabs tablet  Take 1 tablet by mouth daily.     NAMENDA XR 28 MG Cp24  Generic drug:  Memantine HCl ER  Take 28 mg by mouth daily.     sevelamer carbonate 800 MG tablet  Commonly known as:  RENVELA  Take 800 mg by mouth 3 (three) times daily with meals.     torsemide 20 MG tablet  Commonly known as:  DEMADEX  Take 20 mg by mouth daily.        Discharged Condition: Unchanged    Consults: Nephrology/telephone consultation with infectious  Significant Diagnostic Studies: Dg Chest 2 View  06/07/2014   CLINICAL DATA:  Fever today. History of dementia, of pneumonia and renal disorder of dialysis.  EXAM: CHEST  2 VIEW  COMPARISON:  04/24/2014 and 04/02/2014.  FINDINGS: 2217 hr. Left IJ dialysis catheter and left subclavian stent are grossly stable. There is cardiomegaly with aortic atherosclerosis. Pulmonary edema has worsened. There is chronic blunting of the left costophrenic angle which appears unchanged. No new airspace opacity is identified.  IMPRESSION: Worsening pulmonary edema consistent with congestive heart failure or volume overload. Stable blunting of the left costophrenic angle consistent with a chronic pleural effusion or pleural thickening.   Electronically Signed   By: Roxy Horseman M.D.   On: 06/07/2014 23:09    Dg Chest Port 1 View  06/11/2014   CLINICAL DATA:  Fever.  Chronic kidney disease.  EXAM: PORTABLE CHEST - 1 VIEW  COMPARISON:  06/07/2014.  FINDINGS: 2231 hrs. The cardio pericardial silhouette is enlarged. Pulmonary edema pattern persists without substantial interval change. Left-sided HeRO catheter remains in place.  IMPRESSION: Cardiomegaly with pulmonary edema.   Electronically Signed   By: Kennith Center M.D.   On: 06/11/2014 23:20    Lab Results: Basic Metabolic Panel: No results found for this basename: NA, K, CL, CO2, GLUCOSE, BUN, CREATININE, CALCIUM, MG, PHOS,  in the last 72 hours Liver Function Tests: No results found for this basename: AST, ALT, ALKPHOS, BILITOT, PROT, ALBUMIN,  in the last 72 hours   CBC: No results found for this basename: WBC, NEUTROABS, HGB, HCT, MCV, PLT,  in the last 72 hours  Recent Results (from the past 240 hour(s))  CULTURE, BLOOD (ROUTINE X 2)     Status: None   Collection Time    06/11/14  9:30 PM      Result Value Ref Range Status   Specimen Description BLOOD RIGHT HAND   Final   Special Requests BOTTLES DRAWN AEROBIC AND ANAEROBIC 8CC   Final   Culture NO GROWTH 5 DAYS   Final   Report Status 06/16/2014 FINAL   Final  CULTURE, BLOOD (ROUTINE X 2)     Status: None   Collection Time    06/11/14  9:30 PM      Result Value Ref Range Status   Specimen Description BLOOD RIGHT ARM   Final   Special Requests BOTTLES DRAWN AEROBIC ONLY 8CC   Final   Culture NO GROWTH 5 DAYS   Final   Report Status 06/16/2014 FINAL   Final  CULTURE, BLOOD (ROUTINE X 2)     Status: None   Collection Time    06/13/14  7:41 PM      Result Value Ref Range Status   Specimen Description BLOOD RIGHT FOREARM   Final   Special Requests BOTTLES DRAWN AEROBIC AND ANAEROBIC 12CC   Final   Culture NO GROWTH 6 DAYS   Final   Report Status 06/19/2014 FINAL   Final  CULTURE, BLOOD (ROUTINE X 2)     Status: None   Collection Time    06/13/14  7:53 PM      Result Value  Ref Range Status   Specimen Description BLOOD RIGHT HAND   Final   Special Requests BOTTLES DRAWN AEROBIC AND ANAEROBIC 8CC   Final   Culture NO GROWTH 6 DAYS   Final   Report Status 06/19/2014 FINAL   Final     Hospital Course: This is an 80 year old who has had recurrent fevers and change in mental status. She was admitted to the hospital earlier in the month with what appeared to be a urinary tract infection. She was on Cipro as an outpatient. However her urine showed no growth. She was treated with Rocephin but since it was no growth on the urine culture oral and blood cultures it was felt that what probably had happened was that her organism in her urine was sensitive to Cipro and she just had slow recovery. However after she was discharged on Cipro she continued to have fever after dialysis. She dialyzed and then had fever and altered mental status again. After discussion with her nephrologist and by telephone with infectious disease in [place] considering that her urine was negative again it there was concern that her dialysis access was infected. After discussion with her vascular surgeon who [facility] she was transferred there for further treatment of her dialysis access.  Discharge Exam: Blood pressure 112/64, pulse 68, temperature 97.4 F (36.3 C), temperature source Oral, resp. rate 18, height 5\' 3"  (1.6 m), weight 61.4 kg (135 lb 5.8 oz), SpO2 96.00%. She is awake and alert. She is confused. Her chest is clear. Her heart is regular  Disposition: Transferred to her vascular surgeon      Signed: Rowynn Mcweeney L   06/19/2014, 8:31 AM

## 2014-06-19 NOTE — Discharge Summary (Signed)
NAMAllyne Gee:  Oleksy, Kaylena                 ACCOUNT NO.:  [id number]  MEDICAL RECORD NO.:  [id number]  LOCATION:  A303                          FACILITY:  [facility]  PHYSICIAN:  Mindy Behnken L. Juanetta GoslingHawkins, M.D.DATE OF BIRTH:  12/14/1930  DATE OF ADMISSION:  06/11/2014 DATE OF DISCHARGE:  07/28/2015LH                              DISCHARGE SUMMARY   ADDENDUM:  Addition to discharge diagnoses is chronic kidney disease, stage V.     Paisly Fingerhut L. Juanetta GoslingHawkins, M.D.     ELH/MEDQ  D:  06/19/2014  T:  06/19/2014  Job:  [id number]

## 2014-06-20 LAB — CULTURE, BLOOD (SINGLE)

## 2014-06-21 LAB — CULTURE, BLOOD (SINGLE)

## 2014-06-28 NOTE — Discharge Summary (Signed)
NAME:  [name], [name]                      ACCOUNT NO.:  MEDICAL RECORD NO.:  [id number]  LOCATION:                                 FACILITY:  PHYSICIAN:  Kalei Meda L. Juanetta [name], M.D.DATE OF BIRTH:  05-25-1931  DATE OF ADMISSION:  06/11/2014 DATE OF DISCHARGE:  07/28/2015LH                              DISCHARGE SUMMARY   ADDENDUM:  DISCHARGE DIAGNOSIS:  Chronic kidney disease, stage 5.     [name], M.D.     ELH/MEDQ  D:  06/26/2014  T:  06/26/2014  Job:  [id number]

## 2014-08-05 ENCOUNTER — Ambulatory Visit: Payer: Self-pay | Admitting: Vascular Surgery

## 2014-08-09 ENCOUNTER — Emergency Department (HOSPITAL_COMMUNITY): Payer: Medicare HMO

## 2014-08-09 ENCOUNTER — Encounter (HOSPITAL_COMMUNITY): Payer: Self-pay | Admitting: Emergency Medicine

## 2014-08-09 ENCOUNTER — Emergency Department (HOSPITAL_COMMUNITY)
Admission: EM | Admit: 2014-08-09 | Discharge: 2014-08-09 | Disposition: A | Discharge status: Home / Self Care | Payer: Medicare HMO | Attending: Emergency Medicine | Admitting: Emergency Medicine

## 2014-08-09 DIAGNOSIS — R062 Wheezing: Secondary | ICD-10-CM | POA: Diagnosis not present

## 2014-08-09 DIAGNOSIS — R531 Weakness: Secondary | ICD-10-CM

## 2014-08-09 DIAGNOSIS — Z992 Dependence on renal dialysis: Secondary | ICD-10-CM | POA: Diagnosis not present

## 2014-08-09 DIAGNOSIS — N186 End stage renal disease: Secondary | ICD-10-CM | POA: Diagnosis not present

## 2014-08-09 DIAGNOSIS — F0390 Unspecified dementia without behavioral disturbance: Secondary | ICD-10-CM | POA: Diagnosis not present

## 2014-08-09 DIAGNOSIS — R5383 Other fatigue: Secondary | ICD-10-CM

## 2014-08-09 DIAGNOSIS — N19 Unspecified kidney failure: Secondary | ICD-10-CM

## 2014-08-09 DIAGNOSIS — Z8701 Personal history of pneumonia (recurrent): Secondary | ICD-10-CM | POA: Insufficient documentation

## 2014-08-09 DIAGNOSIS — R5381 Other malaise: Secondary | ICD-10-CM | POA: Diagnosis present

## 2014-08-09 DIAGNOSIS — Z79899 Other long term (current) drug therapy: Secondary | ICD-10-CM | POA: Insufficient documentation

## 2014-08-09 LAB — CBC
HEMATOCRIT: 33.5 % — AB (ref 36.0–46.0)
HEMOGLOBIN: 10.7 g/dL — AB (ref 12.0–15.0)
MCH: 30.5 pg (ref 26.0–34.0)
MCHC: 31.9 g/dL (ref 30.0–36.0)
MCV: 95.4 fL (ref 78.0–100.0)
Platelets: 124 10*3/uL — ABNORMAL LOW (ref 150–400)
RBC: 3.51 MIL/uL — AB (ref 3.87–5.11)
RDW: 18.4 % — ABNORMAL HIGH (ref 11.5–15.5)
WBC: 5.9 10*3/uL (ref 4.0–10.5)

## 2014-08-09 LAB — BASIC METABOLIC PANEL
Anion gap: 16 — ABNORMAL HIGH (ref 5–15)
BUN: 84 mg/dL — AB (ref 6–23)
CO2: 25 meq/L (ref 19–32)
Calcium: 9 mg/dL (ref 8.4–10.5)
Chloride: 98 mEq/L (ref 96–112)
Creatinine, Ser: 7.53 mg/dL — ABNORMAL HIGH (ref 0.50–1.10)
GFR calc Af Amer: 5 mL/min — ABNORMAL LOW (ref >90)
GFR calc non Af Amer: 4 mL/min — ABNORMAL LOW (ref >90)
GLUCOSE: 90 mg/dL (ref 70–99)
POTASSIUM: 5.4 meq/L — AB (ref 3.7–5.3)
Sodium: 139 mEq/L (ref 137–147)

## 2014-08-09 LAB — URINE MICROSCOPIC-ADD ON

## 2014-08-09 LAB — URINALYSIS, ROUTINE W REFLEX MICROSCOPIC
Bilirubin Urine: NEGATIVE
GLUCOSE, UA: NEGATIVE mg/dL
KETONES UR: NEGATIVE mg/dL
Nitrite: NEGATIVE
PH: 7 (ref 5.0–8.0)
Protein, ur: 100 mg/dL — AB
Specific Gravity, Urine: 1.015 (ref 1.005–1.030)
Urobilinogen, UA: 0.2 mg/dL (ref 0.0–1.0)

## 2014-08-09 LAB — HEPATIC FUNCTION PANEL
ALBUMIN: 3 g/dL — AB (ref 3.5–5.2)
ALK PHOS: 96 U/L (ref 39–117)
ALT: 93 U/L — ABNORMAL HIGH (ref 0–35)
AST: 113 U/L — AB (ref 0–37)
BILIRUBIN TOTAL: 0.5 mg/dL (ref 0.3–1.2)
Bilirubin, Direct: <0.2 mg/dL (ref 0.0–0.3)
Total Protein: 8.1 g/dL (ref 6.0–8.3)

## 2014-08-09 MED ORDER — SODIUM POLYSTYRENE SULFONATE 15 GM/60ML PO SUSP
30.0000 g | Freq: Once | ORAL | Status: AC
  Administered 2014-08-09: 30 g via ORAL
  Filled 2014-08-09: qty 120

## 2014-08-09 NOTE — ED Notes (Signed)
EDP at bedside  

## 2014-08-09 NOTE — ED Provider Notes (Signed)
CSN: [id number]     Arrival date & time 08/09/14  4098 History  This chart was scribed for [name], MD by Littie Deeds, ED Scribe. This patient was seen in room APA16A/APA16A and the patient's care was started at 10:24 AM.      Chief Complaint  Patient presents with  . Weakness    Patient is a 80 y.o. female presenting with weakness. The history is provided by a relative. No language interpreter was used.  Weakness This is a new problem. The current episode started more than 2 days ago. The problem occurs constantly. The problem has not changed since onset.Pertinent negatives include no chest pain, no abdominal pain and no headaches. Nothing aggravates the symptoms. Nothing relieves the symptoms. She has tried nothing for the symptoms.   HPI Comments: [name] is a 80 y.o. female with a hx of dementia, hemodialysis who presents to the Emergency Department complaining of gradual onset, constant generalized weakness that began 4 days ago. Patient had a dialysis appointment scheduled 5 days ago but patient's dialysis port was found to be blocked. This required patient to have the port unblocked by [facility] on 08/05/14. Patient began feeling nauseated after the procedure and was prescribed anti-emetics with relief. Family member states patient began feeling weak after the procedure and states it continues today. Patient's last dialysis was 3 days ago and she was unable to receive treatment this morning due to the weakness. Family member states patient is normally able to ambulate without assistance but states this is not the case currently. Patient receives dialysis treatments every MWF and is able to produce urine and void once daily.   Past Medical History  Diagnosis Date  . Shortness of breath     with exertion  . Pneumonia 05/2013  . Mental disorder     dementia - short term memory  . History of blood transfusion     after fistula ruptured  . Dementia   . Renal  disorder     dialysis MWF   Past Surgical History  Procedure Laterality Date  . Dialysis fistula creation    . Ligation of arteriovenous  fistula Left 06/04/2013    Procedure: LIGATION OF ARTERIOVENOUS  FISTULA;  Surgeon: Fransisco Hertz, MD;  Location[facility] OR;  Service: Vascular;  Laterality: Left;  . Insertion of dialysis catheter Right 06/04/2013    Procedure: INSERTION OF DIALYSIS CATHETER;  Surgeon: Fransisco Hertz, MD;  Location: [facility] OR;  Service: Vascular;  Laterality: Right;  . Cholecystectomy  1992  . Av fistula placement Right 07/07/2013    Procedure: ARTERIOVENOUS (AV) FISTULA CREATION - RIGHT BRACHIAL CEPHALIC ;  Surgeon: Fransisco Hertz, MD;  Location: [facility] OR;  Service: Vascular;  Laterality: Right;  . Removal of a dialysis catheter Right 11/09/2013    Procedure: REMOVAL OF A DIALYSIS CATHETER- Right TDC ;  Surgeon: Fransisco Hertz, MD;  Location: [facility] OR;  Service: Vascular;  Laterality: Right;  . Insertion of dialysis catheter Left 11/09/2013    Procedure: INSERTION OF DIALYSIS CATHETER- Left TDC;  Surgeon: Fransisco Hertz, MD;  Location: [facility] OR;  Service: Vascular;  Laterality: Left;  . Exchange of a dialysis catheter Left 12/17/2013    Procedure: EXCHANGE OF A DIALYSIS CATHETER;  Surgeon: Chuck Hint, MD;  Location: [facility] OR;  Service: Vascular;  Laterality: Left;  . Fistulogram Right 12/17/2013    Procedure: FISTULOGRAM- RIGHT ARM- POSSIBLE INTERVENTION;  Surgeon: Chuck Hint, MD;  Location: [facility] OR;  Service: Vascular;  Laterality: Right;  . Ligation of competing branches of arteriovenous fistula Right 12/17/2013    Procedure: LIGATION OF COMPETING BRANCHES OF ARTERIOVENOUS FISTULA;  Surgeon: Chuck Hint, MD;  Location: [facility] OR;  Service: Vascular;  Laterality: Right;  . Ligation of arteriovenous  fistula Right 01/19/2014    Procedure: LIGATION OF ARTERIOVENOUS  FISTULA- RIGHT BRACHIOCEPHALIC FISTULA;  Surgeon: Fransisco Hertz, MD;  Location: [facility] OR;  Service: Vascular;  Laterality:  Right;   No family history on file. History  Substance Use Topics  . Smoking status: Never Smoker   . Smokeless tobacco: Never Used  . Alcohol Use: No   OB History   Grav Para Term Preterm Abortions TAB SAB Ect Mult Living                 Review of Systems  Constitutional: Negative for appetite change and fatigue.  HENT: Negative for congestion, ear discharge and sinus pressure.   Eyes: Negative for discharge.  Respiratory: Positive for wheezing. Negative for cough.   Cardiovascular: Negative for chest pain.  Gastrointestinal: Negative for abdominal pain and diarrhea.  Genitourinary: Negative for frequency and hematuria.  Musculoskeletal: Negative for back pain.  Skin: Negative for rash.  Neurological: Positive for weakness. Negative for seizures and headaches.  Psychiatric/Behavioral: Negative for hallucinations.      Allergies  Sulfa antibiotics and Sulfur  Home Medications   Prior to Admission medications   Medication Sig Start Date End Date Taking? Authorizing Provider  donepezil (ARICEPT) 10 MG tablet Take 10 mg by mouth at bedtime.    Historical Provider, MD  Memantine HCl ER (NAMENDA XR) 28 MG CP24 Take 28 mg by mouth daily.    Historical Provider, MD  multivitamin (RENA-VIT) TABS tablet Take 1 tablet by mouth daily.    Historical Provider, MD  sevelamer carbonate (RENVELA) 800 MG tablet Take 800 mg by mouth 3 (three) times daily with meals.    Historical Provider, MD  torsemide (DEMADEX) 20 MG tablet Take 20 mg by mouth daily.    Historical Provider, MD   There were no vitals taken for this visit. Physical Exam  Nursing note and vitals reviewed. Constitutional: She appears well-developed.  HENT:  Head: Normocephalic.  Eyes: Conjunctivae and EOM are normal. No scleral icterus.  Neck: Neck supple. No thyromegaly present.  Cardiovascular: Normal rate and regular rhythm.  Exam reveals no gallop and no friction rub.   No murmur heard. Pulmonary/Chest: No  stridor. She has wheezes (bilaterally). She has no rales. She exhibits no tenderness.  Abdominal: She exhibits no distension. There is no tenderness. There is no rebound.  Musculoskeletal: Normal range of motion. She exhibits no edema.  Dialysis graft left upper arm  Lymphadenopathy:    She has no cervical adenopathy.  Neurological: She exhibits normal muscle tone. Coordination normal.  Oriented to person only, general weakness in all extremities  Skin: No rash noted. No erythema.  Psychiatric: She has a normal mood and affect. Her behavior is normal.    ED Course  Procedures  DIAGNOSTIC STUDIES: Oxygen Saturation is 100% on RA, nml by my interpretation.    COORDINATION OF CARE: 10:32 AM-Discussed treatment plan which includes labs and CXR with pt at bedside and pt agreed to plan.   Labs Review Labs Reviewed  CBC - Abnormal; Notable for the following:    RBC 3.51 (*)    Hemoglobin 10.7 (*)    HCT 33.5 (*)    RDW  18.4 (*)    Platelets 124 (*)    All other components within normal limits  BASIC METABOLIC PANEL  HEPATIC FUNCTION PANEL  URINALYSIS, ROUTINE W REFLEX MICROSCOPIC    Imaging Review No results found.   EKG Interpretation None      MDM   Final diagnoses:  None   I spoke with nephrology and it was felt pt could follow up back up for dialysis.  No antibiotics now  The chart was scribed for me under my direct supervision.  I personally performed the history, physical, and medical decision making and all procedures in the evaluation of this patient.[name], MD 08/09/14 [phone]

## 2014-08-09 NOTE — ED Notes (Signed)
MD at bedside. 

## 2014-08-09 NOTE — ED Notes (Signed)
Pt c/o weakness since Thursday. Pt had surgery on LUE dialysis access on Thursday, some n/v that day. Pt c/o weakness since procedure.

## 2014-08-09 NOTE — ED Notes (Signed)
Paged Dr. Kristian Covey to [phone]

## 2014-08-09 NOTE — Discharge Instructions (Signed)
Follow up for dialysis tomorrow or wed.

## 2014-08-11 LAB — URINE CULTURE: Colony Count: 10000

## 2014-08-12 ENCOUNTER — Telehealth (HOSPITAL_COMMUNITY): Payer: Self-pay

## 2014-08-12 NOTE — ED Notes (Signed)
Post ED Visit - Positive Culture Follow-up  Culture report reviewed by antimicrobial stewardship pharmacist:  Wes Dulaney, Pharm.D., BCPS  Celedonio Miyamoto, Pharm.D., BCPS  Georgina Pillion, Pharm.D., BCPS  [place], [address].D., BCPS, AAHIVP  Estella Husk, Pharm.D., BCPS, AAHIVP  Carly Sabat, Pharm.D.  Enzo Bi, Pharm.D.  Positive urine culture No treatment necessary per Fayrene Helper PA, organism sensitive to the same and no further patient follow-up is required at this time.  Ashley Jacobs 08/12/2014, 11:18 AM

## 2014-10-07 ENCOUNTER — Ambulatory Visit: Payer: Self-pay | Admitting: Vascular Surgery

## 2014-10-28 ENCOUNTER — Encounter (HOSPITAL_COMMUNITY): Payer: Self-pay | Admitting: Vascular Surgery

## 2014-11-02 ENCOUNTER — Ambulatory Visit: Payer: Self-pay | Admitting: Vascular Surgery

## 2014-11-02 LAB — POTASSIUM: Potassium: 4.4 mmol/L (ref 3.5–5.1)

## 2014-11-04 ENCOUNTER — Ambulatory Visit: Payer: Self-pay | Admitting: Vascular Surgery

## 2014-11-04 LAB — POTASSIUM: Potassium: 4.5 mmol/L (ref 3.5–5.1)

## 2014-11-30 ENCOUNTER — Ambulatory Visit: Payer: Self-pay | Admitting: Vascular Surgery

## 2014-11-30 LAB — BASIC METABOLIC PANEL
ANION GAP: 7 (ref 7–16)
BUN: 27 mg/dL — ABNORMAL HIGH (ref 7–18)
CO2: 31 mmol/L (ref 21–32)
Calcium, Total: 8.2 mg/dL — ABNORMAL LOW (ref 8.5–10.1)
Chloride: 101 mmol/L (ref 98–107)
Creatinine: 4.93 mg/dL — ABNORMAL HIGH (ref 0.60–1.30)
EGFR (Non-African Amer.): 9 — ABNORMAL LOW
GFR CALC AF AMER: 11 — AB
GLUCOSE: 84 mg/dL (ref 65–99)
Osmolality: 282 (ref 275–301)
Potassium: 3.8 mmol/L (ref 3.5–5.1)
SODIUM: 139 mmol/L (ref 136–145)

## 2014-12-13 ENCOUNTER — Inpatient Hospital Stay: Payer: Self-pay | Admitting: Internal Medicine

## 2014-12-13 LAB — URINALYSIS, COMPLETE
BACTERIA: NONE SEEN
Bilirubin,UR: NEGATIVE
Glucose,UR: 50 mg/dL (ref 0–75)
KETONE: NEGATIVE
Nitrite: NEGATIVE
PH: 7 (ref 4.5–8.0)
SPECIFIC GRAVITY: 1.006 (ref 1.003–1.030)
Squamous Epithelial: 103

## 2014-12-13 LAB — COMPREHENSIVE METABOLIC PANEL
ALT: 24 U/L
Albumin: 2.7 g/dL — ABNORMAL LOW (ref 3.4–5.0)
Alkaline Phosphatase: 227 U/L — ABNORMAL HIGH
Anion Gap: 12 (ref 7–16)
BILIRUBIN TOTAL: 0.9 mg/dL (ref 0.2–1.0)
BUN: 63 mg/dL — AB (ref 7–18)
CHLORIDE: 98 mmol/L (ref 98–107)
Calcium, Total: 8 mg/dL — ABNORMAL LOW (ref 8.5–10.1)
Co2: 25 mmol/L (ref 21–32)
Creatinine: 8.89 mg/dL — ABNORMAL HIGH (ref 0.60–1.30)
EGFR (Non-African Amer.): 5 — ABNORMAL LOW
GFR CALC AF AMER: 5 — AB
Glucose: 98 mg/dL (ref 65–99)
Osmolality: 288 (ref 275–301)
Potassium: 4.5 mmol/L (ref 3.5–5.1)
SGOT(AST): 54 U/L — ABNORMAL HIGH (ref 15–37)
Sodium: 135 mmol/L — ABNORMAL LOW (ref 136–145)
TOTAL PROTEIN: 7.4 g/dL (ref 6.4–8.2)

## 2014-12-13 LAB — CBC
HCT: 34.4 % — AB (ref 35.0–47.0)
HGB: 11 g/dL — ABNORMAL LOW (ref 12.0–16.0)
MCH: 29.1 pg (ref 26.0–34.0)
MCHC: 31.8 g/dL — ABNORMAL LOW (ref 32.0–36.0)
MCV: 91 fL (ref 80–100)
PLATELETS: 132 10*3/uL — AB (ref 150–440)
RBC: 3.77 10*6/uL — ABNORMAL LOW (ref 3.80–5.20)
RDW: 17.2 % — AB (ref 11.5–14.5)
WBC: 5.1 10*3/uL (ref 3.6–11.0)

## 2014-12-13 LAB — PROTIME-INR
INR: 1.2
PROTHROMBIN TIME: 15.3 s — AB (ref 11.5–14.7)

## 2014-12-13 LAB — CK-MB
CK-MB: <0.5 ng/mL — ABNORMAL LOW (ref 0.5–3.6)
CK-MB: 0.8 ng/mL (ref 0.5–3.6)

## 2014-12-13 LAB — CLOSTRIDIUM DIFFICILE(ARMC)

## 2014-12-13 LAB — TROPONIN I
Troponin-I: 0.12 ng/mL — ABNORMAL HIGH
Troponin-I: 0.39 ng/mL — ABNORMAL HIGH
Troponin-I: 0.41 ng/mL — ABNORMAL HIGH

## 2014-12-13 LAB — MAGNESIUM: MAGNESIUM: 1.9 mg/dL

## 2014-12-13 LAB — PHOSPHORUS: PHOSPHORUS: 6.8 mg/dL — AB (ref 2.5–4.9)

## 2014-12-14 ENCOUNTER — Other Ambulatory Visit: Payer: Self-pay | Admitting: Physician Assistant

## 2014-12-14 DIAGNOSIS — R7989 Other specified abnormal findings of blood chemistry: Secondary | ICD-10-CM

## 2014-12-14 DIAGNOSIS — I5022 Chronic systolic (congestive) heart failure: Secondary | ICD-10-CM

## 2014-12-14 DIAGNOSIS — I340 Nonrheumatic mitral (valve) insufficiency: Secondary | ICD-10-CM

## 2014-12-14 LAB — CBC WITH DIFFERENTIAL/PLATELET
BASOS ABS: 0 10*3/uL (ref 0.0–0.1)
Basophil %: 0.3 %
EOS PCT: 1.7 %
Eosinophil #: 0.2 10*3/uL (ref 0.0–0.7)
HCT: 27.6 % — AB (ref 35.0–47.0)
HGB: 8.7 g/dL — AB (ref 12.0–16.0)
LYMPHS ABS: 0.9 10*3/uL — AB (ref 1.0–3.6)
Lymphocyte %: 6.7 %
MCH: 29.1 pg (ref 26.0–34.0)
MCHC: 31.7 g/dL — ABNORMAL LOW (ref 32.0–36.0)
MCV: 92 fL (ref 80–100)
MONOS PCT: 7.5 %
Monocyte #: 1 x10 3/mm — ABNORMAL HIGH (ref 0.2–0.9)
NEUTROS PCT: 83.8 %
Neutrophil #: 11.3 10*3/uL — ABNORMAL HIGH (ref 1.4–6.5)
PLATELETS: 124 10*3/uL — AB (ref 150–440)
RBC: 3 10*6/uL — AB (ref 3.80–5.20)
RDW: 17.1 % — AB (ref 11.5–14.5)
WBC: 13.5 10*3/uL — AB (ref 3.6–11.0)

## 2014-12-14 LAB — BASIC METABOLIC PANEL
Anion Gap: 13 (ref 7–16)
BUN: 72 mg/dL — ABNORMAL HIGH (ref 7–18)
CALCIUM: 7.4 mg/dL — AB (ref 8.5–10.1)
CO2: 27 mmol/L (ref 21–32)
Chloride: 98 mmol/L (ref 98–107)
Creatinine: 9.11 mg/dL — ABNORMAL HIGH (ref 0.60–1.30)
EGFR (African American): 5 — ABNORMAL LOW
GFR CALC NON AF AMER: 4 — AB
Glucose: 89 mg/dL (ref 65–99)
OSMOLALITY: 296 (ref 275–301)
Potassium: 4.3 mmol/L (ref 3.5–5.1)
Sodium: 138 mmol/L (ref 136–145)

## 2014-12-14 LAB — CK-MB: CK-MB: 1.4 ng/mL (ref 0.5–3.6)

## 2014-12-14 LAB — TROPONIN I
TROPONIN-I: 0.34 ng/mL — AB
TROPONIN-I: 0.32 ng/mL — AB

## 2014-12-14 LAB — MAGNESIUM: MAGNESIUM: 1.9 mg/dL

## 2014-12-15 ENCOUNTER — Encounter: Payer: Self-pay | Admitting: Cardiovascular Disease

## 2014-12-15 LAB — PHOSPHORUS: Phosphorus: 8.7 mg/dL — ABNORMAL HIGH (ref 2.5–4.9)

## 2014-12-16 LAB — CULTURE, BLOOD (SINGLE)

## 2014-12-17 ENCOUNTER — Telehealth: Payer: Self-pay

## 2014-12-17 LAB — CBC WITH DIFFERENTIAL/PLATELET
BASOS PCT: 0.8 %
Basophil #: 0.1 10*3/uL (ref 0.0–0.1)
Eosinophil #: 0.4 10*3/uL (ref 0.0–0.7)
Eosinophil %: 6.7 %
HCT: 22.8 % — ABNORMAL LOW (ref 35.0–47.0)
HGB: 7.2 g/dL — AB (ref 12.0–16.0)
LYMPHS ABS: 0.9 10*3/uL — AB (ref 1.0–3.6)
Lymphocyte %: 13.7 %
MCH: 28.8 pg (ref 26.0–34.0)
MCHC: 31.3 g/dL — ABNORMAL LOW (ref 32.0–36.0)
MCV: 92 fL (ref 80–100)
MONO ABS: 0.6 x10 3/mm (ref 0.2–0.9)
Monocyte %: 8.7 %
Neutrophil #: 4.5 10*3/uL (ref 1.4–6.5)
Neutrophil %: 70.1 %
Platelet: 169 10*3/uL (ref 150–440)
RBC: 2.48 10*6/uL — AB (ref 3.80–5.20)
RDW: 16.4 % — ABNORMAL HIGH (ref 11.5–14.5)
WBC: 6.5 10*3/uL (ref 3.6–11.0)

## 2014-12-17 LAB — BASIC METABOLIC PANEL
ANION GAP: 8 (ref 7–16)
BUN: 34 mg/dL — ABNORMAL HIGH (ref 7–18)
CHLORIDE: 98 mmol/L (ref 98–107)
Calcium, Total: 7.7 mg/dL — ABNORMAL LOW (ref 8.5–10.1)
Co2: 29 mmol/L (ref 21–32)
Creatinine: 6.62 mg/dL — ABNORMAL HIGH (ref 0.60–1.30)
EGFR (African American): 8 — ABNORMAL LOW
GFR CALC NON AF AMER: 6 — AB
Glucose: 87 mg/dL (ref 65–99)
OSMOLALITY: 277 (ref 275–301)
Potassium: 4.3 mmol/L (ref 3.5–5.1)
Sodium: 135 mmol/L — ABNORMAL LOW (ref 136–145)

## 2014-12-17 LAB — PHOSPHORUS: Phosphorus: 4.8 mg/dL (ref 2.5–4.9)

## 2014-12-17 NOTE — Telephone Encounter (Signed)
Attempted to contact pt regarding discharge from [facility] on 12/17/14. Left detailed message asking pt to call back w/ any questions or concerns regarding discharge instructions or medications. Advised her of appt w/ Eula Listenyan Dunn, PA on 01/06/15 @ 1:15. Asked her to call back if she is unable to keep this appt.

## 2014-12-18 LAB — CULTURE, BLOOD (SINGLE)

## 2014-12-20 ENCOUNTER — Telehealth: Payer: Self-pay | Admitting: Cardiovascular Disease

## 2014-12-20 LAB — CULTURE, BLOOD (SINGLE)

## 2014-12-20 NOTE — Telephone Encounter (Signed)
[place] pharmacy calling stating there is a drug interaction. Please call   Pharmacy got an Escribe  On hydraozine and imdur are interacting with each other.   Will severely lower pt BP.   Please call pharmacy

## 2014-12-21 NOTE — Telephone Encounter (Signed)
-  Actually, there is not an interaction there. They work synergistically together with patient's that have an EF below 40% (her's if 30-35%) and poor renal function.  -As long as her BP tolerates this it is ok.  -We use this all the time.

## 2014-12-21 NOTE — Telephone Encounter (Signed)
Spoke w/ pharmacist.  Advised her of Ryan's recommendation.  She states that pt's PCP approved this earlier today.

## 2015-01-03 ENCOUNTER — Encounter: Payer: Self-pay | Admitting: Physician Assistant

## 2015-01-06 ENCOUNTER — Ambulatory Visit (INDEPENDENT_AMBULATORY_CARE_PROVIDER_SITE_OTHER): Payer: Medicare HMO | Admitting: Physician Assistant

## 2015-01-06 ENCOUNTER — Encounter: Payer: Self-pay | Admitting: Physician Assistant

## 2015-01-06 VITALS — BP 136/68 | HR 79 | Ht 63.0 in | Wt 113.0 lb

## 2015-01-06 DIAGNOSIS — J9610 Chronic respiratory failure, unspecified whether with hypoxia or hypercapnia: Secondary | ICD-10-CM

## 2015-01-06 DIAGNOSIS — I2510 Atherosclerotic heart disease of native coronary artery without angina pectoris: Secondary | ICD-10-CM

## 2015-01-06 DIAGNOSIS — Z992 Dependence on renal dialysis: Secondary | ICD-10-CM

## 2015-01-06 DIAGNOSIS — J449 Chronic obstructive pulmonary disease, unspecified: Secondary | ICD-10-CM

## 2015-01-06 DIAGNOSIS — R0602 Shortness of breath: Secondary | ICD-10-CM

## 2015-01-06 DIAGNOSIS — F0390 Unspecified dementia without behavioral disturbance: Secondary | ICD-10-CM

## 2015-01-06 DIAGNOSIS — N186 End stage renal disease: Secondary | ICD-10-CM

## 2015-01-06 DIAGNOSIS — I5022 Chronic systolic (congestive) heart failure: Secondary | ICD-10-CM

## 2015-01-06 DIAGNOSIS — I509 Heart failure, unspecified: Secondary | ICD-10-CM

## 2015-01-06 DIAGNOSIS — T827XXD Infection and inflammatory reaction due to other cardiac and vascular devices, implants and grafts, subsequent encounter: Secondary | ICD-10-CM

## 2015-01-06 NOTE — Patient Instructions (Signed)
Your physician has requested that you have an echocardiogram. Echocardiography is a painless test that uses sound waves to create images of your heart. It provides your doctor with information about the size and shape of your heart and how well your heart's chambers and valves are working. This procedure takes approximately one hour. There are no restrictions for this procedure.  Your physician recommends that you schedule a follow-up appointment in: 3 months  

## 2015-01-06 NOTE — Progress Notes (Signed)
Patient Name: [name], Mom 07/06/1931, MRN [id number]  Date of Encounter: 01/07/2015  Primary Care Provider:  Fredirick Maudlin, MD Primary Cardiologist:  Dr. Mariah Milling, MD  Chief Complaint  Patient presents with  . other    F/u from [facility] no complaints. Meds reviewed verbally with pt.    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   Both the patient and her son deny ever seeing a cardiologist prior to the admission at [facility] described above, including no prior ischemic work ups. She was admitted to [facility] back in July 2015 for UTI and weakness. Echo was checked during that admission back in July 2015 which showed and EF 20-25%, diffuse HK, AK of mid-apicalinferolateral myocardium, GRDD, PASP 44 mm Hg. It is unclear why she did not see cardiology at that time.   Her dialysis access was found to be blocked in 07/2014. This required patient to have the port unblocked by [facility] on 08/05/14. The patient continued to feel on and off weakness post procedure prompting her to go to [facility] ED. She was evaluated there and felt stable for outpatient follow up.   She presented to [facility] on 1/25 for HD and was found to be febrile with a T max of 101.8 and tachycardic at 139 bpm. Her dialysis asccess was found to be infected. She was seen by Dr. Wyn Quaker and temporary access was placed in the right femoral vein. She was scheduled to go to the OR for removal of her current dialysis access. Labs were significant for troponin of 0.12-->0.39-->0.41 and WBC count 5.1-->13.5. EKG showed sinus tachycardia, 123 bpm, left axis deviation, lateral TWI. Echo on 12/14/2014 that showed EF 30-35%,  diastolic dysfunction, global HK, unable to exclude RWMA, moderate MR, normal RVSP. She underwent successful removal of infected graft and placement of new graft during her admission. It was discussed with the patient and both her sons during the admission she would need an ischemic evaluation prior to further discussion of possible ICD. She was discharged on Coreg 3.125 mg bid, hydralazine 10 mg tid, Imdur 30 mg daily, Lipitor 40 mg daily, Plavix 75 mg daily, and torsemide 20 mg daily.   She comes in stating that she feels well. She denies any SOB at rest or DOE. Weight has been stable. No edema. She denies any chest pain or palpitations. She did not take Imdur/hydralazine x 4 days as her son was concerned of the medication combination. Our office had gotten back in touch with the pharmacy. She is back on this now and tolerating well.     Past Medical History  Diagnosis Date  . COPD (chronic obstructive pulmonary disease)     a. on home O2  . Pneumonia 05/2013  . Mental disorder     dementia - short term memory  . History of blood transfusion     after fistula ruptured  . Dementia   . ESRD (end stage renal disease)     a. HD MWF  . Chronic systolic CHF (congestive heart failure)     a. echo 06/14/14: EF 20-25%, diffuse HK, AK of mid-apicalinferolateral myocardium, GRDD, PASP 44 mm Hg; b. echo 12/2014 EF 30-35%, diastolic  dysfunction, global HK, unable to exclude RWMA, mod MR  . CAD (coronary artery disease)     a. presumed CAD - patient's son has declined cath to date  : Past Surgical History  Procedure Laterality Date  . Dialysis fistula creation    . Ligation of arteriovenous  fistula Left 06/04/2013    Procedure: LIGATION OF ARTERIOVENOUS  FISTULA;  Surgeon: Fransisco Hertz, MD;  Location: [facility] OR;  Service: Vascular;  Laterality: Left;  . Insertion of dialysis catheter Right 06/04/2013    Procedure: INSERTION OF DIALYSIS CATHETER;  Surgeon: Fransisco Hertz, MD;  Location: [facility] OR;  Service:  Vascular;  Laterality: Right;  . Cholecystectomy  1992  . Av fistula placement Right 07/07/2013    Procedure: ARTERIOVENOUS (AV) FISTULA CREATION - RIGHT BRACHIAL CEPHALIC ;  Surgeon: Fransisco Hertz, MD;  Location: [facility] OR;  Service: Vascular;  Laterality: Right;  . Removal of a dialysis catheter Right 11/09/2013    Procedure: REMOVAL OF A DIALYSIS CATHETER- Right TDC ;  Surgeon: Fransisco Hertz, MD;  Location: [facility] OR;  Service: Vascular;  Laterality: Right;  . Insertion of dialysis catheter Left 11/09/2013    Procedure: INSERTION OF DIALYSIS CATHETER- Left TDC;  Surgeon: Fransisco Hertz, MD;  Location: [facility] OR;  Service: Vascular;  Laterality: Left;  . Exchange of a dialysis catheter Left 12/17/2013    Procedure: EXCHANGE OF A DIALYSIS CATHETER;  Surgeon: Chuck Hint, MD;  Location: [facility] OR;  Service: Vascular;  Laterality: Left;  . Fistulogram Right 12/17/2013    Procedure: FISTULOGRAM- RIGHT ARM- POSSIBLE INTERVENTION;  Surgeon: Chuck Hint, MD;  Location: [facility] OR;  Service: Vascular;  Laterality: Right;  . Ligation of competing branches of arteriovenous fistula Right 12/17/2013    Procedure: LIGATION OF COMPETING BRANCHES OF ARTERIOVENOUS FISTULA;  Surgeon: Chuck Hint, MD;  Location: [facility] OR;  Service: Vascular;  Laterality: Right;  . Ligation of arteriovenous  fistula Right 01/19/2014    Procedure: LIGATION OF ARTERIOVENOUS  FISTULA- RIGHT BRACHIOCEPHALIC FISTULA;  Surgeon: Fransisco Hertz, MD;  Location: [facility] OR;  Service: Vascular;  Laterality: Right;  . Shuntogram Right 09/10/2013    Procedure: Fistulogram;  Surgeon: Fransisco Hertz, MD;  Location: [facility] CATH LAB;  Service: Cardiovascular;  Laterality: Right;  : History reviewed. No pertinent family history.:  reports that she has never smoked. She has never used smokeless tobacco. She reports that she does not drink alcohol or use illicit drugs.:   Allergies:  Allergies  Allergen Reactions  . Sulfa Antibiotics Nausea And Vomiting and  Rash  . Sulfur Nausea And Vomiting and Rash  . Aspirin      Home Medications:  Current Outpatient Prescriptions  Medication Sig Dispense Refill  . atorvastatin (LIPITOR) 40 MG tablet Take 40 mg by mouth daily.     . carvedilol (COREG) 3.125 MG tablet Take 3.125 mg by mouth 2 (two) times daily with a meal.     . clopidogrel (PLAVIX) 75 MG tablet Take 75 mg by mouth daily.     . diazepam (VALIUM) 2 MG tablet Take 1 mg by mouth every Monday, Wednesday, and Friday with hemodialysis.    [name] donepezil (ARICEPT) 10 MG tablet Take 10 mg by mouth at bedtime.    . hydrALAZINE (APRESOLINE) 10 MG tablet Take 10 mg by mouth 3 (three) times daily.     . isosorbide mononitrate (IMDUR) 30 MG 24 hr tablet Take 30 mg by mouth daily.     [name]  Memantine HCl ER (NAMENDA XR) 28 MG CP24 Take 28 mg by mouth daily.    . multivitamin (RENA-VIT) TABS tablet Take 1 tablet by mouth daily.    . sevelamer carbonate (RENVELA) 800 MG tablet Take 1,600 mg by mouth 3 (three) times daily with meals.     . torsemide (DEMADEX) 20 MG tablet Take 20 mg by mouth daily.     No current facility-administered medications for this visit.    Weights: Wt Readings from Last 3 Encounters:  01/06/15 113 lb (51.256 kg)  06/14/14 135 lb 5.8 oz (61.4 kg)  06/08/14 116 lb 13.5 oz (53 kg)     Review of Systems:  As above. All other systems reviewed and are otherwise negative except as noted above.  Physical Exam:  Blood pressure 136/68, pulse 79, height 5\' 3"  (1.6 m), weight 113 lb (51.256 kg).  General: Pleasant, NAD Psych: Normal affect. Neuro: Alert and oriented X 3. Moves all extremities spontaneously. HEENT: Normal  Neck: Supple without bruits or JVD. Lungs:  Resp regular and unlabored, CTA. Heart: RRR no s3, s4, or murmurs. Abdomen: Soft, non-tender, non-distended, BS + x 4.  Extremities: No clubbing, cyanosis or edema.    Accessory Clinical Findings:  EKG - NSR, 79 bpm, left anterior fascicular block, prolonged QT,  inferolateral TWI  Other studies Reviewed: Additional studies/ records that were [facility] today [facility].  Recent Labs: 02/06/2014: Pro B Natriuretic peptide (BNP) >70000.0* 08/09/2014: ALT 93*; BUN 84*; Creatinine 7.53*; Hemoglobin 10.7*; Platelets 124*; Potassium 5.4*; Sodium 139    Assessment & Plan:  1. Presumed CAD:  -No prior ischemic evaluations, would need this prior to any ICD placement, if she wanted an ICD  -Discussed likelihood of CAD and option of ischemic evaluation. Both her son and grandson would prefer to hold off on any invasive evaluation or procedure at this time given all that she has been through. They will reconsider this at a later date if needed.   -Continue Lipitor 40 mg daily   2. Chronic systolic CHF:  -Euvolemic today -Continue Coreg 3.125 mg bid -Imdur 30 mg daily -Hydralazine 10 mg tid -Torsemide 20 mg daily -Recheck echo in 3 months from when she started medical therapy (approximately end of March/first of April) -Her son is concerned for hypotension thus I did not titrate medication today, will continue to follow   3. ESRD on HD and history of left arm graft infection:  -Removal of infected graft 1/26  -New graft placed 1/29 -Her son reports possible graft replacement in April to move graft site to contralateral arm    4. COPD: -Stable -On home O2  Dispo: -Check echo in 3 months  -Follow up with Dr. Mariah MillingGollan in 3 months  Eula Listenyan Keishia Ground, PA-C [facility] [address] [place], [place] [address] [phone] [facility] 01/07/2015, 11:13 AM

## 2015-01-07 ENCOUNTER — Encounter: Payer: Self-pay | Admitting: Physician Assistant

## 2015-01-07 DIAGNOSIS — T827XXA Infection and inflammatory reaction due to other cardiac and vascular devices, implants and grafts, initial encounter: Secondary | ICD-10-CM | POA: Insufficient documentation

## 2015-01-07 DIAGNOSIS — I2510 Atherosclerotic heart disease of native coronary artery without angina pectoris: Secondary | ICD-10-CM | POA: Insufficient documentation

## 2015-01-07 DIAGNOSIS — J449 Chronic obstructive pulmonary disease, unspecified: Secondary | ICD-10-CM | POA: Insufficient documentation

## 2015-01-07 DIAGNOSIS — I5022 Chronic systolic (congestive) heart failure: Secondary | ICD-10-CM | POA: Insufficient documentation

## 2015-01-13 ENCOUNTER — Ambulatory Visit: Payer: Self-pay | Admitting: Vascular Surgery

## 2015-01-20 ENCOUNTER — Other Ambulatory Visit: Payer: Self-pay

## 2015-01-20 ENCOUNTER — Other Ambulatory Visit (INDEPENDENT_AMBULATORY_CARE_PROVIDER_SITE_OTHER): Payer: Medicare HMO

## 2015-01-20 DIAGNOSIS — I5022 Chronic systolic (congestive) heart failure: Secondary | ICD-10-CM | POA: Diagnosis not present

## 2015-01-20 DIAGNOSIS — I509 Heart failure, unspecified: Secondary | ICD-10-CM

## 2015-01-20 DIAGNOSIS — R0602 Shortness of breath: Secondary | ICD-10-CM

## 2015-01-20 DIAGNOSIS — I2510 Atherosclerotic heart disease of native coronary artery without angina pectoris: Secondary | ICD-10-CM | POA: Diagnosis not present

## 2015-02-01 ENCOUNTER — Ambulatory Visit: Payer: Self-pay | Admitting: Vascular Surgery

## 2015-02-07 ENCOUNTER — Ambulatory Visit: Payer: Self-pay | Admitting: Vascular Surgery

## 2015-02-21 ENCOUNTER — Ambulatory Visit
Admit: 2015-02-21 | Disposition: A | Discharge status: Home / Self Care | Payer: Self-pay | Attending: Vascular Surgery | Admitting: Vascular Surgery

## 2015-02-23 ENCOUNTER — Ambulatory Visit
Admit: 2015-02-23 | Disposition: A | Discharge status: Home / Self Care | Payer: Self-pay | Attending: Vascular Surgery | Admitting: Vascular Surgery

## 2015-03-08 NOTE — Op Note (Signed)
PATIENT NAME:  [name], [name] MR#:  [id number] DATE OF BIRTH:  [birth year]  DATE OF PROCEDURE:  08/26/2012  PREOPERATIVE DIAGNOSES:  1. Complication of AV fistula with decreasing efficiency and prolonged bleeding.  2. Central venous stenosis.  3. Massive swelling of the left arm.   POSTOPERATIVE DIAGNOSES: 1. Complication of AV fistula with decreasing efficiency and prolonged bleeding.  2. Central venous stenosis.  3. Massive swelling of the left arm.   PROCEDURES PERFORMED:  1. Contrast injection left arm brachiocephalic fistula.  2. Percutaneous transluminal angioplasty to 14 mm left subclavian vein.  3. Selective injection of the jugular vein first order catheter placement.   PROCEDURE PERFORMED BY: Renford DillsGregory G. Caiya Bettes, MD   SEDATION: Versed 3 mg plus fentanyl 100 mcg administered IV. Continuous ECG, pulse oximetry, and cardiopulmonary monitoring was performed throughout the entire procedure by the interventional radiology nurse. Total sedation time was 45 minutes.   ACCESS: 7 French sheath left arm brachiocephalic fistula.   CONTRAST USED: Isovue 35 mL.   FLUORO TIME: 0.3 minutes.   INDICATIONS: [name] is a 81 year old woman who presents with massive edema of her left upper extremity. The patient has a history of central venous stenosis treated at an outside facility in the past. They have been noting decreasing efficiency of the dialysis as well as prolonged bleeding following decannulation. She is, therefore, undergoing contrast injection with hopes for possible intervention. The risks and benefits were reviewed. All questions answered. The patient agrees to proceed.   PROCEDURE: The patient is taken to Special Procedures and placed in the supine position. After adequate sedation is achieved, the left arm is prepped and draped in a sterile fashion and extended palm upward. Left arm is then anesthetized with 1% lidocaine overlying the pulsatile fistula near the arterial anastomosis.  Micropuncture needle is inserted without difficulty, microwire followed by micro sheath, J-wire followed by 6 French sheath. Hand injection of contrast is utilized to demonstrate the venous anatomy.   The fistula itself is noted to be widely patent. It is somewhat aneurysmal in segments but otherwise appears in good condition. The cephalic confluence is widely patent as is the distal subclavian vein. However, there is a very tight greater than 95% stenosis at the confluence of the subclavian vein with the jugular vein. Innominate vein and superior vena cava are widely patent.   Angioplasty to 10 mm yields little if any change in spite of using a 10 x 4 Dorado balloon inflated to 28 atmospheres. This was done after Magic torque wire was introduced under fluoroscopic guidance and 4000 units of heparin was given. Given the specter of placing a stent that would jail or tage the left jugular, I elected to take a Kumpe catheter with the Magic torque wire and the catheter wire was negotiated into the jugular vein. A straight glide catheter was then advanced over the wire up to the level of the mandible and contrast by hand injection was performed demonstrating the jugular anatomy. The jugular vein is widely patent and relatively spared from the stricture. In the subclavian the subclavian stricture essentially begins at the point of junction between the subclavian and the jugular. This represents a challenge as placing a stent across the stricture would then imply blocking or caging the jugular vein rendering future IV access from the left jugular approach impossible. Given this circumstance, I elected to balloon with a 14 x 4, first a 14 x 4 Amada was used. This did not have enough pressure and then a  14 x 4 Atlas was used to its burst pressure with a fairly good result. Follow-up angiography demonstrated the luminal gain was now 12 mm. There appeared to be significant improvement in the flow of contrast.   Given  this result which appeared quite good, I elected to stop. Palpation of the fistula still demonstrated significant pulsatility but there is an improved thrill appreciated. Purse-string suture of 4-0 Monocryl was placed around the 7 [place] sheath. 7 French sheath had been upsized to allow for the 14 mm balloons. There were no immediate complications.   INTERPRETATION: Initial views of the fistula demonstrated it as slightly aneurysmal in spots but widely patent and appears overall in good condition. The more proximal cephalic vein and the cephalic confluence are widely patent. Distal subclavian vein is widely patent. Subclavian vein demonstrates a focal 2 to 3 cm narrowing greater than 95% which is located right at the junction of the subclavian and the jugular veins. It is completely within the subclavian vein. Jugular vein appears to be spared. Innominate vein and superior vena cava were widely patent. Jugular vein itself is widely patent and is still an excellent access point for future central venous access.   After angioplasty to 14 mm, there is significant luminal gain with a lumen of measuring 12 mm in diameter. At this point I have elected to terminate the procedure. I will see how the patient does. She appears to have had an excellent result from her angioplasty. However, if this is not an adequate result to allow for decompression of her arm and stenting is required, I will recommend to the patient that we access the jugular vein with a 7 French sheath and position a balloon across the confluence of the jugular and subclavian veins from the jugular approach and then deploy the stent from the approach via the fistula. This should allow for extending the stent only a millimeter or two into the innominate preserving the jugular vein as future access.   The patient tolerated the procedure well and was taken to the recovery room in excellent condition.   ____________________________ Renford Dills,  MD ggs:drc D: 08/26/2012 10:55:56 ET T: 08/26/2012 11:17:27 ET JOB#: [id number]  cc: Renford Dills, MD, <Dictator> Renford Dills MD ELECTRONICALLY SIGNED 09/03/2012 12:34

## 2015-03-12 NOTE — Op Note (Signed)
PATIENT NAME:  [name], [name] MR#:  [id number] DATE OF BIRTH:  [birth year]  DATE OF PROCEDURE:  02/24/2014  PREOPERATIVE DIAGNOSES:  1.  End-stage renal disease requiring hemodialysis.  2.  Complication of dialysis device.  3.  Superior vena cava syndrome.   POSTOPERATIVE DIAGNOSES: 1.  End-stage renal disease requiring hemodialysis.  2.  Complication of dialysis device.  3.  Superior vena cava syndrome.   PROCEDURES PERFORMED:  1.  Creation of a left arm heRO graft.  2.  Insertion of a right femoral cuffed tunneled dialysis catheter.   SURGEON: Renford Dills, M.D.   ANESTHESIA: General by LMA.   FLUIDS: Per anesthesia records.   ESTIMATED BLOOD LOSS: 75 mL.   COMPLICATIONS:  None.   SPECIMEN: None.   INDICATIONS: [name] is an 81 year old woman currently maintained on hemodialysis via a left internal jugular catheter. She has been recently evaluated by her local vascular surgeon and was told that she does not have any options for dialysis other than the catheter. She presented to the office for further assessment and I believe she is a candidate for a heRO graft. Risks and benefits are reviewed. All questions answered. The patient and family have agreed to proceed.   DESCRIPTION OF PROCEDURE: The patient is taken the operating room, placed in the supine position. After adequate general anesthesia is induced and appropriate invasive monitors are placed, she is positioned supine and prepped and draped in a sterile fashion. Appropriate timeout is called.   The ultrasound is placed in a sterile sleeve and the right common femoral vein is evaluated with ultrasound. It is echolucent and compressible indicating patency. Images are recorded for the permanent record. Under direct visualization microneedle is inserted, microwire followed by MicroSheath, J-wire followed by a small counterincision at the wire insertion site. Dilator is passed over the wire. Dilators are somewhat difficult  to track, and the peel-away sheath will not track over the wire and therefore the previous dilators reinserted and Amplatz Super Stiff wire is inserted. Over the Amplatz Super Stiff wire the dilator with peel-away sheath tracks easily and a 31 cm tip to cuff Cannon catheter is advanced. Under fluoroscopy, the tips are positioned, exit site is selected, small incision is created, and the tunneling device is then passed subcutaneously. Catheter is then pulled through the subcutaneous tunnel and the hub is connected without difficulty. Both lumens aspirate and flush easily. The catheter is secured to the skin of the thigh with 0 silk. Each lumen is packed with 5000 units of heparin and the counterincision at the wire insertion site is closed with 4-0 Monocryl subcuticular and then Dermabond. Sterile dressing is applied.   Attention is then turned to the existing left IJ tunneled catheter. A small transverse incision is made overlying the cuff, and the cuff is dissected free from the surrounding tissues. The catheter is then delivered into the surgical field, transected and an Amplatz Super Stiff wire is advanced through the catheter under fluoroscopic guidance down into the inferior vena cava. Intravenous portion of the catheter is removed and passed off the field. Dilator and peel-away sheath are then advanced over the Amplatz Super Stiff wire. Dilators removed and the intravascular silicon portion of the heRO graft is advanced over the wire down into the inferior vena cava and the dilator and wire were removed. The catheter is then flushed with heparinized saline and clamped.   Attention is then turned to the brachial artery at just above the antecubital fossa, which is  exposed through a linear incision looping the artery proximally and distally. A second incision is then made in the deltopectoral groove and the Gore tunneler is used to create a subcutaneous tunnel from the brachial artery to the deltopectoral  groove incision. The Gore-Tex portion of the heRO graft is then pulled through the tunneler maintaining orientation with the racing stripe.   Kelly clamp is used to pull the silicon portion subcutaneously from the neck counterincision to the deltopectoral groove and under fluoroscopy, the tip is positioned in the mid atrium. The silicon portion is then transected and connected to the Gore-Tex portion at the grommet. This is secured with a 0 Ethibond tie. The PTFE portion of the heRO graft is then adjusted for length and approximated to the brachial artery and marked. Brachial artery is then clamped proximally and distally. Arteriotomy is made, 6-0 Prolene stay sutures are placed. The PTFE portion is transected with a slight bevel and an end graft to side brachial artery anastomosis is fashioned with running CV-6 suture. Flushing maneuvers are performed and flow is established through the heRO graft. Excellent thrill is noted just above the anastomosis and a 2+ radial pulse is maintained.   All 3 wounds are then irrigated with sterile saline and closed in layers using 3-0 Vicryl, followed by 4-0 Monocryl subcuticular and then Dermabond. The patient tolerated the procedure well. There were no immediate complications. Sponge and needle counts are correct and she is taken to the recovery area in excellent condition. She will follow up in approximately 2-3 weeks to evaluate when the heRO graft use can be initiated.     ____________________________ Renford DillsGregory G. Abdirahman Chittum, MD ggs:tc D: 02/24/2014 18:46:16 ET T: 02/24/2014 19:29:39 ET JOB#: [id number]  cc: Renford DillsGregory G. [name], MD, <Dictator>             Salomon MastBelayenh Befekadu, MD Renford DillsGREGORY G Kyvon Hu MD ELECTRONICALLY SIGNED 03/09/2014 11:16

## 2015-03-12 NOTE — Op Note (Signed)
PATIENT NAME:  [name], [name] MR#:  [id number] DATE OF BIRTH:  Jan 27, 1931  DATE OF PROCEDURE:  08/05/2014  PREOPERATIVE DIAGNOSES: 1.  End-stage renal disease.  2.  Thrombosed left upper extremity HeRO graft.  3.  Dementia.  4.  Hypertension.   POSTOPERATIVE DIAGNOSES:  1.  End-stage renal disease.  2.  Thrombosed left upper extremity HeRO graft.  3.  Dementia.  4.  Hypertension.  5.  Thrombosed left brachial artery and previous brachial artery stent.   PROCEDURES: 1.  Ultrasound guidance for vascular access to the HeRO graft in both an antegrade and retrograde fashion.  2.  Catheter placement into left brachial and then radial artery from the retrograde graft access.  3.  Left upper extremity shuntogram and central venogram.  4.  Left upper extremity angiogram.  5.  Catheter-directed thrombolysis of the left arm HeRO graft, in the left brachial artery, with 4 mg of tPA using the AngioJet AVX catheter.  6.  Mechanical rheolytic thrombectomy of the left brachial artery using the AngioJet AVX catheter.  7.  Mechanical rheolytic thrombectomy of the HeRO graft using the AngioJet AVX catheter.  8.  Fogarty embolectomy of the HeRO graft in the brachial artery.  9.  Percutaneous transluminal angioplasty of the brachial artery with 6 and 7 mm diameter angioplasty balloon.  10.  Percutaneous transluminal angioplasty of HeRO arterial anastomosis with 6 and 7 mm diameter angioplasty balloon.  11.  Percutaneous transluminal angioplasty of mid and distal graft with 7 mm diameter angioplasty balloon.  12.  Covered stent placement to the brachial artery for residual stenosis and thrombosis after above procedures with an 8 mm diameter x 5 cm in length Viabahn covered stent.  13.  Covered stent placement to the mid and distal PTFE portion of the HeRO graft for residual stenosis and thrombosis after the above-mentioned procedures.   SURGEON: Annice Needy, M.D.   ANESTHESIA: Local with moderate  conscious sedation.   BLOOD LOSS: 50 mL.   INDICATION FOR PROCEDURE: This is an 81 year old female with end-stage renal disease. She presents with thrombosis of her left arm HeRO graft. She is brought in for an attempt at salvage. The risks and benefits were discussed. Informed consent was obtained.   DESCRIPTION OF PROCEDURE: The patient was brought to the vascular suite. The left upper extremity was sterilely prepped and draped and a sterile surgical field was created. The graft was accessed both in an antegrade and retrograde fashion crossing under direct ultrasound guidance with micropuncture needles. A micropuncture wire and sheath were then placed and then we upsized to 6-French sheaths. Permanent images were recorded. Imaging through the 6-French sheath showed thrombosis of the HeRO graft. It also showed thrombosis of the brachial artery distal to the graft, in the area of the previous brachial artery stent placement that was performed for a large pseudoaneurysm previously. This was a separate and distinct problem and possibly a more severe problem than the actual thrombosis of her graft. [name] was able to use a Kumpe catheter and a [employer] wire and navigate through the occluded graft into the brachial artery, then through the occluded brachial artery and into the radial artery distally confirming intraluminal flow. A wire was then replaced. Catheter-directed thrombolysis was performed through the antegrade sheath, in the PTFE portion of the graft, with about 2 mg of tPA administered throughout the graft to the grommet connected to the silicone portion. [name] then took the AngioJet AVX catheter and administered another 2 mg  of tPA to the proximal portion of the graft, the arterial anastomosis and the brachial artery down to the brachial bifurcation of the radial and ulnar artery. This was allowed to dwell for 20 minutes. Mechanical rheolytic thrombectomy was then performed first in the brachial artery  and radial artery area. The catheter was then pulled back into the graft and mechanical rheolytic thrombectomy was performed throughout the graft switching sheaths to be able to treat the entirety of the graft. Following this the graft actually had some flow through it; however, there were still significant areas of thrombus causing stenosis in the midportion from a fractured previous stent placement within the graft. There was also still a residual arterial plug and still thrombus within the brachial artery stent, which were separate and distinct. [name] used a 6 mm diameter Lutonix drug-coated angioplasty balloon in the brachial artery stent and then a 7 mm diameter angioplasty balloon in the brachial artery stent. For the arterial plug [name] performed angioplasty of the arterial anastomosis with a 6 and then a 7 mm diameter angioplasty balloon. However, there was still thrombus present. A Fogarty embolectomy balloon was then taken over the wire into the brachial artery. Embolectomy was performed of the brachial artery, in the area of thrombosed stent and throughout the arterial portion of the graft and pulled out the retrograde sheath. At this point, we had cleared the arterial plug at the anastomosis, but there was still residual thrombus in the proximal portion of the brachial artery stent although there was improvement. This thrombus was nearly occlusive and [name] elected to take an 8 mm diameter x 5 cm in length covered stent in the brachial artery and extend this about 1 cm more proximal to the previous stent, but still outside of the graft. We had to exchange for a 7-French sheath and an 0.018 wire, which was done without difficulty, and then [name] postdilated the 8 mm diameter Viabahn stent in the brachial artery with a 7 mm diameter balloon. This resulted in significantly improved flow within the brachial artery, although there was significant spasm in the arteries distally. It was difficult to opacify distal flow, but  this did not appear to be thrombosed and wire was passed easily. At this point, the arterial anastomosis was cleared. The proximal portion of the PTFE portion of the graft was cleared and the brachial artery had flow restored so [name] removed the retrograde sheath. Through the antegrade sheath, [name] passed a 7 mm diameter high-pressure angioplasty balloon and inflated throughout the mid and distal PTFE portions of the graft where there was the fractured stent. Improved flow was seen, but there was still significant thrombus, particularly in the access sites at both the arterial and venous access sites, in the midportion of the graft. [name] elected to cover this area with a new covered stent due to the stent fracture and no improvement after angioplasty, thrombolysis and thrombectomy. We again exchanged for a 7-French sheath and an 0.018 wire. A 7 mm diameter x 15 cm in length Viabahn covered stent was deployed, post dilated with a 7 mm diameter balloon with an excellent angiographic completion result and no significant residual stenosis. At this point, [name] elected to terminate the procedure. The sheath was removed, 4-0 Monocryl pursestring suture was placed, pressure was held and sterile dressing was placed. The patient tolerated the procedure well and was taken to the recovery room in stable condition.  ____________________________ Annice NeedyJason S. Penelopi Mikrut, MD jsd:sb D: 08/05/2014 14:05:39 ET T: 08/05/2014 15:12:36  ET JOB#: [id number]  cc: Annice Needy, MD, <Dictator> Annice Needy MD ELECTRONICALLY SIGNED 08/31/2014 10:18

## 2015-03-12 NOTE — Op Note (Signed)
PATIENT NAME:  Allyne GeeBEVILL, Dondra MR#:  [id number] DATE OF BIRTH:  July 17, 1931  DATE OF PROCEDURE:  04/05/2014  PREOPERATIVE DIAGNOSES: 1.  End-stage renal disease.  2.  Poorly functioning left arm HeRO graft with large hematoma and arm swelling after use.  3.  Dementia.  4.  Hypertension.   POSTOPERATIVE DIAGNOSES: 1.  End-stage renal disease.  2.  Poorly functioning left arm HeRO graft with large hematoma and arm swelling after use.  3.  Dementia.  4.  Hypertension.   PROCEDURE: 1.  Ultrasound guidance for vascular access to left arm HeRO graft.  2.  Left upper extremity shuntogram and left arm angiogram.  3.  A 7 mm diameter x 10 cm length covered stent to the midportion of the left arm HeRO graft for pseudoaneurysm.  4.  A 7 mm diameter x 5 cm length covered stent to the left brachial artery and antecubital fossa for large brachial artery pseudoaneurysm   SURGEON: Annice NeedyJason S. Daneisha Surges, M.D.   ANESTHESIA: Local with moderate conscious sedation.   ESTIMATED BLOOD LOSS: 25 mL.   CONTRAST USED: 58 mL Visipaque.   INDICATION FOR PROCEDURE: This is an 81 year old female with end-stage renal disease. She began using her left arm HeRO graft in the past couple of weeks. She developed significant arm swelling and bruising and her graft was not usable towards the end of last week.  She was brought in and a temporary dialysis catheter was placed and she is brought in today for an angiogram for further evaluation.  Risks and benefits were discussed. Informed consent was obtained.    DESCRIPTION OF PROCEDURE: The patient was brought to the vascular suite. The left upper extremity was sterilely prepped and draped and a sterile surgical field was created. The graft was accessed in the proximal upper arm under direct ultrasound guidance with a micropuncture needle. Micropuncture wire and sheath were then placed and upsized to a 6-French sheath. Imaging was performed which showed the graft to be patent. There was  some concern that the graft may have been thrombosed but was widely patent and the outflow was patent. The most central portion of the HeRO graft had pulled back to the innominate and superior vena cava junction but there was good flow. There was a moderate to large-sized pseudoaneurysm at the midportion of the HeRO graft with the graft occluded. Catheter was placed into the brachial artery. The brachial artery proximal to the graft was widely patent. The brachial artery distal to the graft had a large pseudoaneurysm about 4 to 5 cm beyond the graft itself and about 3 cm  before the brachial bifurcation. This was a problem. Both of the pseudoaneurysms were causing the swelling and hematoma with bleeding in the arm and both were planned to be treated.  I up-sized to a 7-French sheath, gave a small dose of intravenous heparin and with a Kumpe catheter got an 0.018 wire well out the ulnar artery. The brachial artery pseudoaneurysm was treated with a 7 mm diameter x 5 cm length Viabahn covered stent post dilated with a 6 mm balloon with an excellent angiographic completion result and exclusion of the pseudoaneurysm. The graft was treated with a 7 mm diameter x 10 cm in length Viabahn stent post dilated with a 6 mm balloon with exclusion of the moderate-sized pseudoaneurysm.  At this point, the graft was patent. The pseudoaneurysms were excluded and I elected to terminate the procedure. The sheath was removed, 4-0 Monocryl pursestring suture was placed. Pressure  was held. Sterile dressing was placed. The patient tolerated the procedure well and was taken to the Recovery Room in stable condition.   ____________________________ Annice Needy, MD jsd:cs D: 04/05/2014 16:49:34 ET T: 04/05/2014 20:23:31 ET JOB#: [id number]  cc: Annice Needy, MD, <Dictator> Annice Needy MD ELECTRONICALLY SIGNED 04/19/2014 14:44

## 2015-03-12 NOTE — Op Note (Signed)
PATIENT NAME:  [name], Elinora MR#:  [id number] DATE OF BIRTH:  12/20/1930  DATE OF PROCEDURE:  03/30/2014  PREOPERATIVE DIAGNOSIS: End-stage renal disease with functional permanent dialysis access and no longer needing PermCath.   POSTOPERATIVE DIAGNOSIS: End-stage renal disease with functional permanent dialysis access and no longer needing PermCath.   PROCEDURE: Removal of right femoral PermCath.   SURGEON: [place] Marialy Urbanczyk, [address]   ANESTHESIA: None.   ESTIMATED BLOOD LOSS: Minimal.   INDICATION FOR THE PROCEDURE: The patient is an 81 year old white female with end-stage renal disease. Her access is functional and she no longer needs PermCath; therefore, this will be removed.   DESCRIPTION OF THE PROCEDURE: The patient is brought to the vascular interventional radiology area and positioned supine. The right groin and existing catheter were sterilely prepped and draped and a sterile surgical field was created. Two sutures were removed. On palpating to identify the cuff, it was noted that there was no significant scar tissue that needed to be released. The catheter was then removed in its entirety without difficulty. Pressure was held. Sterile dressing was placed. The patient tolerated the procedure well.     ____________________________ Hoyle Sauerhelsea N. Cantrell Larouche, PA-C cnh:dmm D: 03/30/2014 08:46:48 ET T: 03/30/2014 10:18:04 ET JOB#: [id number]  cc: Hoyle Sauerhelsea N. Tondalaya Perren, PA-C, <Dictator> Miesha Bachmann N Ayan Heffington PA ELECTRONICALLY SIGNED 04/02/2014 8:58

## 2015-03-12 NOTE — H&P (Signed)
PATIENT NAME:  [name], [name] MR#:  [id number] DATE OF BIRTH:  03/29/1931  DATE OF ADMISSION:  06/15/2014  PRIMARY CARE PHYSICIAN: In [place], Dr. Juanetta Gosling.  REFERRING PHYSICIAN: [facility]   CHIEF COMPLAINT: Fever.   HISTORY OF PRESENT ILLNESS: An 81 year old female who has history of dementia, end-stage renal disease on hemodialysis, hyperparathyroidism and anxiety, who was admitted to [facility] in May and stayed up to the first week of June for vascular issues and dialysis graft placement. After that, she was discharged home and stayed home until last week when she had fever and she was initially given ciprofloxacin as an outpatient after doing workup, which came out to be negative, but she continued to have fever, and so admitted to [facility] 4 days ago when she was given broad-spectrum antibiotic like Rocephin and vancomycin to cover her for any UTI or blood stream infection secondary to her fistula. Blood cultures, which were done on the second admission, came out to be negative and the repeat one, and so finally decided to transfer the patient to [facility], as the team of the doctors, nephrologists and primary medical doctor thinking over there that this  patient might need urgent surgery to get removal of her dialysis catheter. From [facility], they spoke to our hospital as they do not have vascular surgery specialty and transferred the patient to our hospital for further management from a vascular point of view. Meanwhile, she received vancomycin and Rocephin over there, but the blood culture report remains negative x 2, as they sent all of the reports with her.   REVIEW OF SYSTEMS:  Unable to get as the patient has some dementia.   PAST MEDICAL HISTORY:  1.  Dementia.  2.  End-stage renal disease on hemodialysis Monday, Wednesday, Friday.  3.  Secondary hyperparathyroidism.  4.  Anxiety.   SOCIAL HISTORY: No smoking. No  alcohol abuse. No illicit drug use. Lives at home with her son.   FAMILY HISTORY: Mother and father both deceased and they died from complications of old age.   MEDICATIONS:   1.  Acetaminophen 325 mg take 2 tablets every 4 hours as needed for pain.  2.  Diazepam 2 mg oral, take 1/2 tablet Monday, Wednesday, Friday before dialysis.  3.  Donepezil 10 mg oral tablet at bedtime.  4.  Torsemide 20 mg oral once a day.  5.  Namenda XR  28 mg oral extended capsule once a day.  6.  Renvela 800 mg tablets, take 2 tablets 3 times a day.  7.  Renal vitamin oral tablet once a day. 8.  As mentioned above, the patient was on vancomycin and Rocephin at [facility].    PHYSICAL EXAMINATION: VITAL SIGNS: In the ER, temperature 98.4. Pulse is 86, respirations 24, blood pressure 147/72 and pulse ox is 93 on 2 liters oxygen.  GENERAL: The patient is fully alert, but not very well oriented, has some dementia, but happily demented and cooperative with history taking and physical examination.  HEENT: Head and neck atraumatic. Conjunctivae pink. Oral mucosa moist.  NECK: Supple. No JVD.  RESPIRATORY: Bilateral equal and clear air entry.  CARDIOVASCULAR: S1, S2 present, regular. No murmur.  ABDOMEN: Soft, nontender. Bowel sounds present. No organomegaly.  SKIN: No rashes. There is AV fistula or AV graft present on the left arm.  JOINTS: No swelling or tenderness.  NEUROLOGICAL: Power 5/5. Moves all 4 limbs. Follows commands, but due to dementia  is not very active.  PSYCHIATRIC:  Does not have any acute psychiatric illness, but has some dementia.   IMPORTANT LABORATORY DATA:  Glucose 110, BUN 47, creatinine 6.10, sodium 137. Potassium is 4.5. Chloride is 99. CO2 is 30. Osmolality 287 and calcium is 8.1.   Total protein 7.2. Albumin is 2.3. Bilirubin is 0.4, alkaline phosphatase 112, SGOT 11.   WBC 5.4, hemoglobin 10.3, platelet count 138 and MCV is 94.   ASSESSMENT AND PLAN:  An 81 year old female who  had an AV graft done by vascular in our hospital 2 months ago, had some infection for the last 2 week, admitted to [facility], and finally the doctor team over there suspected that it might be a dialysis access  infection, although her cultures remain negative, and they transferred her over here, as we have vascular services and her fever was not going away.  1.  Fever. This is under investigation, but as per the doctors in [facility], suspected to have AV graft infection. We will call vascular consult for further management of this issue and get blood culture, and meanwhile continue Rocephin and vancomycin.  2. End-stage renal disease on hemodialysis. We will call nephrology and they will continue hemodialysis while she is here in the hospital.  3.  Dementia. We will continue Aricept.   CODE STATUS: FULL CODE.   Total time spent on this admission is 50 minutes.    ____________________________ Hope PigeonVaibhavkumar G. Elisabeth PigeonVachhani, MD vgv:dmm D: 06/15/2014 22:25:53 ET T: 06/15/2014 22:45:52 ET JOB#: [id number]  cc: Hope PigeonVaibhavkumar G. Elisabeth PigeonVachhani, MD, <Dictator> Altamese DillingVAIBHAVKUMAR Colton Engdahl MD ELECTRONICALLY SIGNED 06/28/2014 8:14

## 2015-03-12 NOTE — H&P (Signed)
PATIENT NAME:  [name], [name] MR#:  [id number] DATE OF BIRTH:  05-07-1931  DATE OF ADMISSION:  04/02/2014  PRIMARY CARE PHYSICIAN: Dr. Juanetta Gosling in [place].   CHIEF COMPLAINT: Worsening mental status and AV fistula being clotted.   HISTORY OF PRESENT ILLNESS: This is an 81 year old female who was sent over from dialysis today, as her left arm AV fistula was clotted off and malfunctioning. The patient just recently had the AV fistula placed a few weeks back. It was functioning fine this Wednesday when they used it to dialyze her, but this afternoon it got clotted off. As per the family, the patient has also been not in her usual sense. She has been more and more confused and more lethargic than usual. She does have underlying baseline dementia; therefore, it is difficult to  get a good history from the patient herself. She was brought to the ER for further evaluation. The patient does make a little bit of urine still. Her urinalysis was positive for urinary tract infection. She was also noted to have mild CHF and elevated blood pressure. Hospitalist services were contacted for further treatment and evaluation.   REVIEW OF SYSTEMS: Otherwise unobtainable given the patient's mental status.   PAST MEDICAL HISTORY: Consistent with dementia; end-stage renal disease on hemodialysis Monday, Wednesday, Friday; secondary hyperparathyroidism; anxiety.   ALLERGIES: SULFA DRUGS.   SOCIAL HISTORY: No smoking. No alcohol abuse. No illicit drug abuse. Lives at home with her son.   FAMILY HISTORY: Mother and father are both deceased, and they died from complications of old age.   CURRENT MEDICATIONS: Valium 1 mg Monday, Wednesday, Friday before dialysis; Aricept 10 mg at bedtime, Namenda XR 28 mg daily, Rena-Vite 1 tab daily, Renvela 800 mg 2 tabs t.i.d. and 2 tabs with snacks, torsemide 20 mg daily.   PHYSICAL EXAMINATION: Presently is as follows:  VITAL SIGNS: Temperature is 98.3, pulse 94, respirations 22,  blood pressure 165/95, sats 94% on room air.  GENERAL: She is a pleasant-appearing female, mild respiratory distress.  HEAD, EYES, EARS, NOSE, THROAT: Atraumatic, normocephalic. Extraocular muscles are intact. Pupils equal and reactive to light. Sclerae anicteric. No conjunctival injection. No oropharyngeal erythema.  NECK: Supple. There is no jugular venous distention, no bruits, no lymphadenopathy, no thyromegaly.  HEART: Regular rate and rhythm. No murmurs. No rubs. No clicks.  LUNGS: Clear to auscultation bilaterally. No rales, no rhonchi, no wheezes.  ABDOMEN: Soft, flat, nontender, nondistended. Has good bowel sounds. No hepatosplenomegaly appreciated.  EXTREMITIES: No evidence of any cyanosis, clubbing, or peripheral edema. Has +2 pedal and radial pulses bilaterally. She does have a left upper extremity AV fistula, which is warm to touch. No bruit, no thrill noted. No drainage noted. There is a bandage over it.  LYMPHATIC: There is no cervical or axillary lymphadenopathy.  NEUROLOGIC: She is alert, awake, oriented x 1. Has baseline dementia, therefore, difficult to do a full neurological exam, but moves all extremities spontaneously.   LABORATORY AND RADIOLOGICAL DATA: Serum glucose of 88, BUN 45, creatinine 5.16, sodium 138, potassium 3.8, chloride 98, bicarbonate 32. LFTs are within normal limits. Troponin 0.04. White cell count 4.9, hemoglobin 10.8, hematocrit 33.6, platelet count 116. Urinalysis shows 2+ leukocyte esterase with too numerous to count white cells and 2+ bacteria. The patient also had a chest x-ray done, which showed mild CHF pattern.   ASSESSMENT AND PLAN: This is an 81 year old female with a history of dementia, end-stage renal disease on hemodialysis, secondary hyperparathyroidism, presented to the hospital due to  altered mental status and also clotting of her left arm arteriovenous fistula.  1.  Altered mental status: The patient has baseline dementia, but as per the  family, she is more lethargic and confused than usual. This is likely metabolic encephalopathy secondary to urinary tract infection. I will treat her with IV ceftriaxone, follow her urine cultures and her mental status.  2.  Urinary tract infection: Likely cause of her metabolic encephalopathy and mental status change. As mentioned, I will treat her with IV ceftriaxone and follow urine cultures.  3.  End-stage renal disease on hemodialysis: The patient's AV fistula has clotted off at dialysis today. She normally gets dialyzed on Monday, Wednesday, Friday. There is no urgent need for hemodialysis, but she likely will need dialysis maybe tomorrow. I will get a vascular consult to get a temporary access and a nephrology consult to get her dialyzed.  4.  Dementia: Continue with the Namenda and continue with her Aricept. Continue p.r.n. Valium for agitation, anxiety.  5.  Secondary hyperparathyroidism: Continue with Renvela.   CODE STATUS: The patient is a full code.   TIME SPENT: 50 minutes.    ____________________________ Rolly PancakeVivek J. Cherlynn KaiserSainani, MD vjs:jcm D: 04/02/2014 17:25:00 ET T: 04/02/2014 18:44:47 ET JOB#: [id number]  cc: Rolly PancakeVivek J. Cherlynn KaiserSainani, MD, <Dictator> Houston SirenVIVEK J SAINANI MD ELECTRONICALLY SIGNED 04/14/2014 15:07

## 2015-03-12 NOTE — Op Note (Signed)
PATIENT NAME:  [name], [place] MR#:  [id number] DATE OF BIRTH:  1931-01-15  DATE OF PROCEDURE:  04/03/2014  PREOPERATIVE DIAGNOSES: 1.  End-stage renal disease.  2.  Nonfunctional arteriovenous graft.  3.  Hypertension.   POSTOPERATIVE DIAGNOSES: 1.  End-stage renal disease.  2.  Nonfunctional arteriovenous graft.  3.  Hypertension.   PROCEDURES PERFORMED: 1.  Ultrasound guidance for vascular access to left femoral vein.  2.  Placement of 30 cm long Trialysis-type dialysis catheter, left femoral vein.   SURGEON: Annice NeedyJason S. Branton Einstein, MD   ANESTHESIA: Local.   ESTIMATED BLOOD LOSS: 25 mL.   INDICATION FOR PROCEDURE: An 81 year old white female who had her PermCath removed within the past week or 2. However, when she went to dialysis yesterday, her graft would not run and she came to the hospital for shortness of breath and was admitted for need for dialysis. A temporary catheter will be placed and she is on the schedule for Monday for an attempt at salvage of her graft.   DESCRIPTION OF PROCEDURE: The patient was laid flat in her floor bed. Her left groin was sterilely prepped and draped and a sterile surgical field was created. The left femoral vein was visualized with ultrasound and found to be patent. It was then accessed under direct ultrasound guidance without difficulty with a Seldinger needle. A J-wire was then placed after skin nick and dilatation. The 30 cm long  Trialysis-type dialysis catheter was placed over the wire and the wire was removed. All 3 lumens withdrew blood well and flushed easily with sterile saline. It was secured at 29 cm with 3 nylon sutures.     ____________________________ Annice NeedyJason S. Dannica Bickham, MD jsd:jcm D: 04/03/2014 12:19:09 ET T: 04/03/2014 19:57:51 ET JOB#: [id number]  cc: Annice NeedyJason S. Ryer Asato, MD, <Dictator> Annice NeedyJASON S Calder Oblinger MD ELECTRONICALLY SIGNED 04/19/2014 14:44

## 2015-03-12 NOTE — Op Note (Signed)
PATIENT NAME:  [name], [name] MR#:  [id number] DATE OF BIRTH:  Oct 27, 1931  DATE OF PROCEDURE:  10/07/2014  PREOPERATIVE DIAGNOSES:  1. Clotted left arm arteriovenous graft.  2. Dementia.  3. Hypertension.   POSTOPERATIVE DIAGNOSES:  1. Clotted left arm arteriovenous graft with thrombosis of left brachial artery stent. 2. Dementia.  3. Hypertension  SURGEON: Annice Needy, M.D.   ANESTHESIA: Local with moderate conscious sedation.   BLOOD LOSS: 50 mL.   FLUOROSCOPY TIME: Five minutes and 35 mL of contrast used.  INDICATION FOR PROCEDURE: This is an 81 year old female with a long history of end-stage renal disease and multiple other ongoing issues. 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 She has a pseudoaneurysm in the midportion of her graft and what appeared to be a previous arterial access site. She has had previous stents placed within her graft. She comes in with a thrombosed graft as well as what appeared to be blistered area over her skin consistent with a prior pseudoaneurysm. She is brought in for angiography to see if her graft can be salvaged. Risks and benefits were discussed. Informed consent was obtained.   DESCRIPTION OF PROCEDURE: The patient is brought to the vascular suite. Left upper extremity was sterilely prepped and draped and a sterile surgical field was created. It was accessed in a retrograde fashion close the shoulder under direct ultrasound guidance with a micropuncture needle and a permanent image was recorded. A micropuncture wire and sheath were performed. There was a clear stent fracture on fluoroscopy in the midportion of the graft and a pseudoaneurysm around this area. There was thrombus within the graft. A Kumpe catheter and Magic torque wire were placed to the brachial artery and we found that the brachial artery stent was actually thrombosed as well. At this point and a Magic torque wire was placed  into the forearm in what appeared to be the ulnar artery. The AngioJet AVX catheter was used to instill 4 mg of tPA throughout the thrombosed graft and then advanced into the brachial artery and into the brachial artery stent. Fogarty embolectomy was performed, pulling clot from the brachial artery stent and across the arterial anastomosis and through the graft. This resulted in restoration of flow within the graft and improvement in the brachial artery, although there was still residual thrombus. [name] elected to balloon this area with a 5 mm diameter angioplasty balloon. This was done with 5 mm diameter x 6 cm Lutonix drug-coated angioplasty balloon. There was some residual thrombus in the distal end of the stent, but this no longer appeared flow limiting, and her distal runoff and radial and ulnar appeared continuous. At this point, the graft had flow in it. There was clear extravasation in the area of pseudoaneurysm and a stent fracture, and [name] elected to cover this area with a covered stent. [name] exchanged for a 7 French sheath and an 8 mm diameter x 10 cm length Viabahn stent was placed post dilated with a 7 mm balloon with an excellent angiographic completion result. There was now patency restored in the graft. [name] removed the sheath, 4-0 Monocryl pursestring suture was placed. Pressure was held. Sterile dressing was placed. The patient tolerated the procedure well and was taken to the recovery room in stable condition.    ____________________________ Annice Needy, MD jsd:bm D: 10/07/2014 15:50:43 ET T: 10/08/2014 04:25:45 ET JOB#: [id number]  cc: Annice Needy, MD, <Dictator> Marlow Baars  Peightyn Roberson MD ELECTRONICALLY SIGNED 10/31/2014 11:40

## 2015-03-12 NOTE — Discharge Summary (Signed)
Dates of Admission and Diagnosis:  Date of Admission 15-Jun-2014   Date of Discharge 17-Jun-2014   Admitting Diagnosis fever- ESRD on HD   Final Diagnosis Fever of unknown origin- remains afebrile for 48 hrs- all blood cx and other work ups negative- likley viral fever. ESRD on HD Dementia    Chief Complaint/History of Present Illness An 81 year old female who has history of dementia, end-stage renal disease on hemodialysis, hyperparathyroidism and anxiety, who was admitted to [facility] in May and stayed up to the first week of June for vascular issues and dialysis graft placement. After that, she was discharged home and stayed home until last week when she had fever and she was initially given ciprofloxacin as an outpatient after doing workup, which came out to be negative, but she continued to have fever, and so admitted to [facility] 4 days ago when she was given broad-spectrum antibiotic like Rocephin and vancomycin to cover her for any UTI or blood stream infection secondary to her fistula. Blood cultures, which were done on the second admission, came out to be negative and the repeat one, and so finally decided to transfer the patient to [facility], as the team of the doctors, nephrologists and primary medical doctor thinking over there that this  patient might need urgent surgery to get removal of her dialysis catheter. From [facility], they spoke to our hospital as they do not have vascular surgery specialty and transferred the patient to our hospital for further management from a vascular point of view. Meanwhile, she received vancomycin and Rocephin over there, but the blood culture report remains negative x 2, as they sent all of the reports with her.   Allergies:  Sulfa drugs: N/V/Diarrhea  Hepatic:  28-Jul-15 20:00   Bilirubin, Total 0.4  Alkaline Phosphatase 112 (46-116 NOTE: New Reference Range 06/08/14)  SGPT (ALT)  11  (14-63 NOTE: New Reference Range 06/08/14)  SGOT (AST) 16  Total Protein, Serum 7.2  Albumin, Serum  2.3  Routine Micro:  28-Jul-15 20:00   Micro Text Report BLOOD CULTURE   COMMENT                   NO GROWTH IN 48 HOURS   ANTIBIOTIC                       Culture Comment NO GROWTH IN 48 HOURS  Result(s) reported on 17 Jun 2014 at 08:00PM.    20:06   Micro Text Report BLOOD CULTURE   COMMENT                   NO GROWTH IN 48 HOURS   ANTIBIOTIC                       Culture Comment NO GROWTH IN 48 HOURS  Result(s) reported on 17 Jun 2014 at 08:00PM.  29-Jul-15 18:54   Micro Text Report BLOOD CULTURE   COMMENT                   NO GROWTH IN 18-24 HOURS   ANTIBIOTIC                       Culture Comment NO GROWTH IN 18-24 HOURS  Result(s) reported on 17 Jun 2014 at 08:00PM.    19:10   Micro Text Report BLOOD CULTURE   COMMENT  NO GROWTH IN 18-24 HOURS   ANTIBIOTIC                       Culture Comment NO GROWTH IN 18-24 HOURS  Result(s) reported on 17 Jun 2014 at 08:00PM.  Routine Chem:  28-Jul-15 20:00   Glucose, Serum  110  BUN  47  Creatinine (comp)  6.10  Sodium, Serum 137  Potassium, Serum 4.5  Chloride, Serum 99  CO2, Serum 30  Calcium (Total), Serum  8.1  Anion Gap 8  Osmolality (calc) 287  eGFR (African American)  7  eGFR (Non-African American)  6 (eGFR values <59m/min/1.73 m2 may be an indication of chronic kidney disease (CKD). Calculated eGFR is useful in patients with stable renal function. The eGFR calculation will not be reliable in acutely ill patients when serum creatinine is changing rapidly. It is not useful in  patients on dialysis. The eGFR calculation may not be applicable to patients at the low and high extremes of body sizes, pregnant women, and vegetarians.)  29-Jul-15 04:06   Glucose, Serum 86  BUN  54  Creatinine (comp)  6.70  Sodium, Serum 138  Potassium, Serum 4.7  Chloride, Serum 102  CO2, Serum 26   Calcium (Total), Serum  8.3  Anion Gap 10  Osmolality (calc) 290  eGFR (African American)  6  eGFR (Non-African American)  5 (eGFR values <649mmin/1.73 m2 may be an indication of chronic kidney disease (CKD). Calculated eGFR is useful in patients with stable renal function. The eGFR calculation will not be reliable in acutely ill patients when serum creatinine is changing rapidly. It is not useful in  patients on dialysis. The eGFR calculation may not be applicable to patients at the low and high extremes of body sizes, pregnant women, and vegetarians.)  Routine Hem:  28-Jul-15 20:00   WBC (CBC) 5.4  RBC (CBC)  3.36  Hemoglobin (CBC)  10.3  Hematocrit (CBC)  31.5  Platelet Count (CBC)  138  MCV 94  MCH 30.8  MCHC 32.8  RDW  16.3  Neutrophil % 69.7  Lymphocyte % 12.3  Monocyte % 6.8  Eosinophil % 10.2  Basophil % 1.0  Neutrophil # 3.8  Lymphocyte #  0.7  Monocyte # 0.4  Eosinophil # 0.6  Basophil # 0.1 (Result(s) reported on 15 Jun 2014 at 08:25PM.)  29-Jul-15 04:06   WBC (CBC) 5.0  RBC (CBC)  2.98  Hemoglobin (CBC)  8.9  Hematocrit (CBC)  28.0  Platelet Count (CBC)  139  MCV 94  MCH 29.9  MCHC  31.8  RDW  16.3  Neutrophil % 62.8  Lymphocyte % 18.5  Monocyte % 6.2  Eosinophil % 11.6  Basophil % 0.9  Neutrophil # 3.2  Lymphocyte #  0.9  Monocyte # 0.3  Eosinophil # 0.6  Basophil # 0.0 (Result(s) reported on 16 Jun 2014 at 04:57AM.)   Pertinent Past History:  Pertinent Past History 1.  Dementia.  2.  End-stage renal disease on hemodialysis Monday, Wednesday, Friday.  3.  Secondary hyperparathyroidism.  4.  Anxiety.   Hospital Course:  Hospital Course [age]r female who had an AV graft done by vascular in our hospital 2 months ago, had some infection for the last 2 week, [facility] suspected that it might be a dialysis access  infection, although her blood cultures remain negative, and they  transferred her over here, as we have vascular  services and her fever was not going away.  1.  Fever. This is under investigation, but as per the doctors in [facility], suspected to have AV graft infection.  vascular consult for further management of this issue negative blood culture, and meanwhile continued Rocephin and vancomycin.   All blood cx- including those in [place] [place]- ae negative.   No more fever in last 48 hrs.   will stop ABx- may be it was viral illness.   no vascular intervention needed. 2. End-stage renal disease on hemodialysis. nephrology to continue hemodialysis while she is here in the hospital.  3.  Dementia. continue Aricept. 4.generalized weakness- PT eval done- suggested SNF- but son want to take her home. d/c home today with PT and HHA.   Condition on Discharge Stable   Code Status:  Code Status Full Code   DISCHARGE INSTRUCTIONS HOME MEDS:  Medication Reconciliation: Patient's Home Medications at Discharge:     Medication Instructions  rena-vite oral tablet  1 tab(s) orally once a day   namenda xr 28 mg oral capsule, extended release  1 cap(s) orally once a day   renvela 800 mg oral tablet  2 tabs (1687m) orally 3 times a day and 2 tabs (16086m orally with snacks   donepezil 10 mg oral tablet  1 tab(s) orally once a day (at bedtime)   torsemide 20 mg oral tablet  1 tab(s) orally once a day   diazepam 2 mg oral tablet  0.5 tab (68m25morally Monday, Wednesday, and Friday before dialysis.   acetaminophen 325 mg oral tablet  2 tab(s) orally every 4 hours, As needed, pain or temp. greater than 100.4     Physician's Instructions:  Home Health? Yes   HomTuttletownerapy  Nurse Aid   Home Oxygen? Yes   Oxygen delivery at home: 2L  Nasal Cannula   Diet Low Sodium   Activity Limitations As tolerated   Return to Work Not Applicable   Time frame for Follow Up Appointment 1-2 weeks  PMD   Other Comments follow with PMD in 1 week,  continue HD as routine.     Schnier, Gregory(Consultant): [facility] [address] [id number]  Electronic Signatures: VacVaughan BastaD)  (Signed 31-Jul-15 15:22)  Authored: ADMISSION DATE AND DIAGNOSIS, CHIEF COMPLAINT/HPI, Allergies, PERTINENT LABS, PERTINENT PAST HISTORY, HOSPITAL COURSE, DISCHARGE INSTRUCTIONS HOME MEDS, PATIENT INSTRUCTIONS, Follow Up Physician   Last Updated: 31-Jul-15 15:22 by VacVaughan BastaD)

## 2015-03-12 NOTE — Consult Note (Signed)
 Present Illness An 81-year-old female who has history of dementia, end-stage renal disease on hemodialysis, hyperparathyroidism and anxiety, who was admitted to [facility] in May and stayed up to the first week of June for vascular issues and dialysis graft placement. After that, she was discharged home and stayed home until last week when she had fever and she was initially given ciprofloxacin as an outpatient after doing workup, which came out to be negative, but she continued to have fever, and so admitted to [facility] 4 days ago when she was given broad-spectrum antibiotic like Rocephin and vancomycin to cover her for a UTI or bacteremia. Blood cultures were negative so it was decided to transfer the patient to [facility], as the team of the doctors, nephrologists and primary medical doctor  are wprroed that she may need removal of her dialysis catheter. From [facility], they spoke to our hospital as they do not have vascular surgery specialty and she was transferred to our hospital for further management from a vascular point of view.   Home Medications: Medication Instructions Status  acetaminophen 325 mg oral tablet 2 tab(s) orally every 4 hours, As needed, pain or temp. greater than 100.4 Active  Rena-Vite oral tablet 1 tab(s) orally once a day Active  Namenda XR 28 mg oral capsule, extended release 1 cap(s) orally once a day Active  Renvela 800 mg oral tablet 2 tabs (1600mg) orally 3 times a day and 2 tabs (1600mg) orally with snacks Active  donepezil 10 mg oral tablet 1 tab(s) orally once a day (at bedtime) Active  torsemide 20 mg oral tablet 1 tab(s) orally once a day Active  diazepam 2 mg oral tablet 0.5 tab (1mg) orally Monday, Wednesday, and Friday before dialysis. Active    Sulfa drugs: N/V/Diarrhea  Case History:  Family History Non-Contributory   Social History negative tobacco, negative ETOH, negative Illicit drugs   Review of  Systems:  ROS Pt not able to provide ROS  dementia   Physical Exam:  GEN well developed, no acute distress   HEENT hearing intact to voice, dry oral mucosa   NECK supple  trachea midline   RESP normal resp effort  no use of accessory muscles   CARD regular rate  no JVD   VASCULAR ACCESS AV graft present  on dialysis, no erythema of the graft area   ABD denies tenderness  soft   EXTR negative cyanosis/clubbing, negative edema   SKIN No rashes, skin turgor poor   NEURO cranial nerves intact   PSYCH alert, poor insight   Nursing/Ancillary Notes: **Vital Signs.:   29-Jul-15 08:55  Vital Signs Type Q 8hr  Temperature Temperature (F) 97.9  Celsius 36.6  Temperature Source oral  Pulse Pulse 79  Respirations Respirations 18  Systolic BP Systolic BP 126  Diastolic BP (mmHg) Diastolic BP (mmHg) 71  Mean BP 89  Pulse Ox % Pulse Ox % 95  Pulse Ox Activity Level  At rest  Oxygen Delivery 2L   Routine Chem:  29-Jul-15 04:06   Glucose, Serum 86  BUN  54  Creatinine (comp)  6.70  Sodium, Serum 138  Potassium, Serum 4.7  Chloride, Serum 102  CO2, Serum 26  Calcium (Total), Serum  8.3  Anion Gap 10  Osmolality (calc) 290  eGFR (African American)  6  eGFR (Non-African American)  5 (eGFR values <60mL/min/1.73 m2 may be an indication of chronic kidney disease (CKD). Calculated eGFR is useful in patients   with stable renal function. The eGFR calculation will not be reliable in acutely ill patients when serum creatinine is changing rapidly. It is not useful in  patients on dialysis. The eGFR calculation may not be applicable to patients at the low and high extremes of body sizes, pregnant women, and vegetarians.)    18:54   Phosphorus, Serum  6.2 (Result(s) reported on 16 Jun 2014 at 07:31PM.)  Routine Hem:  29-Jul-15 04:06   WBC (CBC) 5.0  RBC (CBC)  2.98  Hemoglobin (CBC)  8.9  Hematocrit (CBC)  28.0  Platelet Count (CBC)  139  MCV 94  MCH 29.9  MCHC  31.8  RDW   16.3  Neutrophil % 62.8  Lymphocyte % 18.5  Monocyte % 6.2  Eosinophil % 11.6  Basophil % 0.9  Neutrophil # 3.2  Lymphocyte #  0.9  Monocyte # 0.3  Eosinophil # 0.6  Basophil # 0.0 (Result(s) reported on 16 Jun 2014 at 04:57AM.)    Impression 1.  Fever. This is under investigation, but as per the doctors in [place],  AV graft infection is a possibility. Currently she is on dialysis an not having any blood culture, and meanwhile continue Rocephin and vancomycin.   At this time she has a normal white count and no fever.  I do not advocate excision at this point. 2. End-stage renal disease on hemodialysis. We will call nephrology and they will continue hemodialysis while she is here in the hospital.  3.  Dementia. We will continue Aricept.   Electronic Signatures: ,  (MD)  (Signed 29-Jul-15 20:25)  Authored: General Aspect/Present Illness, Home Medications, Allergies, History and Physical Exam, Vital Signs, Labs, Impression/Plan   Last Updated: 29-Jul-15 20:25 by ,  (MD) 

## 2015-03-12 NOTE — Discharge Summary (Signed)
PATIENT NAME:  [name], [name] MR#:  [id number] DATE OF BIRTH:  22-Apr-1931  DATE OF ADMISSION:  04/02/2014 DATE OF DISCHARGE:  04/06/2014  PRIMARY CARE PHYSICIAN:  Dr. [place] in [place].  FINAL DIAGNOSES: 1.  Acute respiratory failure, now chronic with pulse ox dropping to 82% with ambulation.   2.  Pulmonary edema with heart failure.    3.  Arteriovenous fistula occlusion.  4.  Urinary tract infection.  5.  End-stage renal disease.  6.  Dementia.   MEDICATIONS ON DISCHARGE: Include Rena-Vite 1 tablet daily, Namenda XR 28 mg extended-release daily, Renvela 800 mg 2 tablets 3 times a day and 2 tablets with snacks, Aricept 10 mg at bedtime, torsemide 20 mg daily, diazepam 2 mg half tablet Monday, Wednesday, Friday before dialysis; Augmentin 500 mg at bedtime for 6 days, acetaminophen 325 mg 2 tablets every 4 hours as needed for pain or temperature, oxygen 2 liters nasal cannula, continuous with portable tank.   DIET:  Regular consistency renal diet.   REFERRAL: To dialysis on Monday, Wednesday, Friday. Next dialysis will be on Friday.  Follow up in 1 to 2 weeks with Dr. Juanetta GoslingHawkins, 1 to 2 weeks with Dr. Wyn Quakerew.   HOSPITAL COURSE: The patient was admitted 04/02/2014, and discharged 04/06/2014. Came in with worsening mental status, AV fistula being clotted. The patient has baseline dementia and was more lethargic and confused, likely metabolic encephalopathy secondary to urinary tract infection and was started on IV ceftriaxone. A temporary catheter was placed for dialysis and nephrology consultation was obtained for dialysis.   LABORATORY AND RADIOLOGICAL DATA DURING THE HOSPITAL COURSE: Included blood cultures that showed no growth. Lipase 146, glucose 88, BUN 45, creatinine 5.16, sodium 138, potassium 3.8, chloride 98, CO2 of 32, calcium 8.9. Liver function tests: Normal range. White blood cell count 4.9, H and H 10.8 and 33.6, platelet count of 116. Urine culture grew out greater than 100,000  Streptococcus viridans. Urinalysis: 3+ leukocyte esterase.  Chest x-ray: Mild CHF pattern. Hepatitis B surface antibody nonreactive. Hepatitis B core antibody negative. Another hepatitis surface antigen negative. White count upon discharge 4.5, hemoglobin 9.4, platelet count 117. Glucose 142, BUN 54, creatinine 6.07, potassium 3.9.    HOSPITAL COURSE PER PROBLEM LIST:  1.  For the patient's acute respiratory failure, this is now chronic. Pulse ox on day of discharge dropped to 82% with ambulation. The patient was set up with 2 liters nasal cannula upon discharge. This will be worn 24/7.  2.  Fluid overload, likely from missed dialysis, better with dialysis during the hospital course. Can consider echocardiogram as an outpatient. The patient is on standing dose torsemide. Blood pressure on the lower side, 117/61, limited with other medications.  3.  Arteriovenous fistula occlusion, status post procedure by Dr. Wyn Quakerew on 04/05/2014. Dr. Wyn Quakerew wanted to rest this graft and resume dialysis on Friday.  4.  Urinary tract infection. The patient was discharged home on Augmentin.  5.  End-stage renal disease, required temporary catheter being placed in the left groin during the hospital stay and dialysis through the catheter. The patient did have dialysis, last on May 19th; can skip dialysis on the 20th and go on Friday.  6.  Dementia. The patient does answer some questions but says no to most questions.  7.  Anemia of chronic disease. Can consider Procrit with dialysis.   TIME SPENT ON DISCHARGE: 35 minutes.   ____________________________ Herschell Dimesichard J. Renae GlossWieting, MD rjw:dmm D: 04/07/2014 09:54:00 ET T: 04/07/2014 10:10:44 ET JOB#: [id number]  cc: Kari Baars, MD Herschell Dimes. Renae Gloss, MD, <Dictator>, Bruna Potter MD ELECTRONICALLY SIGNED 04/08/2014 19:36

## 2015-03-14 LAB — SURGICAL PATHOLOGY

## 2015-03-16 NOTE — Op Note (Signed)
PATIENT NAME:  [name], [name] MR#:  [id number] DATE OF BIRTH:  Sep 25, 1931  PREOPERATIVE DIAGNOSES:  1. Left arm wound near left arm HeRO graft.  2. End-stage renal disease.  3. Dementia.  4. Hypertension.   POSTOPERATIVE DIAGNOSES:  1. Left arm wound near left arm HeRO graft.  2. End-stage renal disease.  3. Dementia.  4. Hypertension.   PROCEDURE: Irrigation and debridement of skin and soft tissue of her left arm HeRO graft for less than 25 sq cm.   SURGEON: Annice NeedyJason S. Dew, MD   ANESTHESIA: General.   ESTIMATED BLOOD LOSS: Minimal.   INDICATION FOR PROCEDURE: An 81 year old female with a multitude of issues. Her left arm HeRO graft has been very complicated keep open. She has had multiple previous interventions. She now has developed a wound at one the access sites with a sinus tract. She is brought to the Operating Room today to debride the necrotic tissue associated with the wound and try to get closure over the graft. The risks and benefits were discussed. Informed consent was obtained.   DESCRIPTION OF PROCEDURE: The patient is brought to the operative suite and after an adequate level of anesthesia was obtained, the left upper extremity was sterilely prepped and draped, and a sterile surgical field was created.  I opened the wound to the lateral portion of the wound and excised all the fibrinous exudate and nonviable tissue. The wound was very raw. I then brought the skin together with a series of mattress nylon sutures to try to get skin closure over the graft.  Xeroform and dry sterile dressing were placed. The patient was then awakened from anesthesia and taken to the recovery room in stable condition, having tolerated the procedure well.   ____________________________ Annice NeedyJason S. Dew, MD jsd:ap D: 11/04/2014 16:07:08 ET T: 11/04/2014 16:35:48 ET JOB#: [id number]  cc: Annice NeedyJason S. Dew, MD, <Dictator> Annice NeedyJASON S DEW MD ELECTRONICALLY SIGNED 11/22/2014 14:10

## 2015-03-20 NOTE — Op Note (Signed)
PATIENT NAME:  [name], Donja I MR#:  [id number] DATE OF BIRTH:  22-Jan-1931  DATE OF PROCEDURE:  12/17/2014  PREOPERATIVE DIAGNOSES:  1.  End-stage renal disease.  2.  Recent removal of left arm HeRO graft for infection.  3.  Dementia.  POSTOPERATIVE DIAGNOSES:  1.  End-stage renal disease.  2.  Recent removal of left arm HeRO graft for infection.  3.  Dementia.   PROCEDURES:  1.  Ultrasound guidance for vascular access to left femoral vein.  2.  Placement of left femoral tunneled hemodialysis catheter, 43 cm tip-to-cuff.  3.  Fluoroscopic guidance for placement of catheter.  4.  Removal of left arm VAC dressing in place with wet-to-dry dressing.   SURGEON: Annice NeedyJason S. Dew, MD  ANESTHESIA: Local with moderate conscious sedation.   ESTIMATED BLOOD LOSS: 25 mL   INDICATION FOR PROCEDURE: An 81 year old female with end-stage renal disease. Her left arm HeRO graft was removed earlier this week for infection. She is having her PermCath placed today. We are switching out her left arm VAC to a wet-to-dry dressing which she will go home with. The risks and benefits were discussed. Informed consent was obtained.   DESCRIPTION OF PROCEDURE: The patient was brought to the vascular suite. The VAC dressing was removed. All 3 remaining wounds were clean and dry, with good granulation tissue, no exposed vessels, and no purulent drainage. A wet-to-dry dressing was then placed.   I then initially prepped the right neck and chest and a sterile surgical field was created. The right jugular vein was visualized and found to be occluded and not suitable for catheter placement at this point, without extensive intervention. We then turned our attention to the left groin. Her temporary catheter was in the right groin. This showed a patent femoral vein which was accessed under direct ultrasound guidance without difficulty with a Seldinger needle. A J-wire was then placed, after skin nick and dilatation. The peel-away  sheath was placed over the wire. I then made a counter incision about 5 cm lateral and inferior to the access incision, and used fluoroscopic guidance to select a 43 cm tip-to-cuff tunneled hemodialysis catheter. This was tunneled from the counterincision to  the access site, placed through the peel-away sheath over the wire and the wire was removed. Both lumens withdrew dark red nonpulsatile blood, and flushed easily with heparinized saline, and a concentrated heparin solution was placed. The catheter tips were parked in the retrohepatic vena cava, just below the right atrium. The access incision was closed with a single 4-0 Monocryl. A 4-0 Monocryl pursestring suture was placed around the exit site, and 2 Prolene sutures were used to secure the catheter to the leg. Sterile dressings were placed. The patient tolerated the procedure well, and was taken to the recovery room in stable condition.    ____________________________ Annice NeedyJason S. Dew, MD jsd:MT D: 12/17/2014 14:04:36 ET T: 12/17/2014 15:08:16 ET JOB#: [id number]  cc: Annice NeedyJason S. Dew, MD, <Dictator> Annice NeedyJASON S DEW MD ELECTRONICALLY SIGNED 12/22/2014 11:54

## 2015-03-20 NOTE — Op Note (Signed)
PATIENT NAME:  [name], Jodene I MR#:  [id number] DATE OF BIRTH:  Feb 08, 1931  DATE OF PROCEDURE:  12/13/2014  PREOPERATIVE DIAGNOSES:  1.  End-stage renal disease. 2.  Infection of left arm HeRO graft.  3.  Dementia.  POSTOPERATIVE DIAGNOSES:  1.  End-stage renal disease. 2.  Infection of left arm HeRO graft.  3.  Dementia.  PROCEDURES:  1. Ultrasound guidance for vascular access to the right femoral vein.  2. Placement of non-tunneled dialysis catheter into the right femoral vein.  SURGEON: Annice NeedyJason S. Dew, MD  ANESTHESIA: Local.   BLOOD LOSS: Minimal.   INDICATION FOR PROCEDURE: An 81 year old female who was admitted to the hospital today with infection of her left arm HeRO graft. This will need to be excised. She will need a temporary dialysis access while her infection clears and then a PermCath can be placed once she has become infection free after several days. Risks and benefits were discussed. Informed consent was obtained.   DESCRIPTION OF THE PROCEDURE: The patient's right groin was sterilely prepped and draped and a sterile surgical field was created. The right femoral vein was visualized with ultrasound and found to be patent. It was then accessed under direct ultrasound guidance without difficulty with a Seldinger needle and a permanent image was recorded. A J-wire was placed after skin nick and dilatation. The 30 cm Trialysis dialysis catheter was then placed over the wire and the wire was removed. All lumens withdrew blood well and flushed easily with sterile saline. It was secured to the skin with 3 nylon sutures. A sterile dressing was placed. The patient tolerated the procedure well and remained in his bed in a stable condition.   ____________________________ Annice NeedyJason S. Dew, MD jsd:TT D: 12/13/2014 17:08:10 ET T: 12/13/2014 21:22:55 ET JOB#: [id number]  cc: Annice NeedyJason S. Dew, MD, <Dictator> Annice NeedyJASON S DEW MD ELECTRONICALLY SIGNED 12/22/2014 11:54

## 2015-03-20 NOTE — Consult Note (Signed)
General Aspect Primary Cardiologist: New to [facility] __________________  81 year old female with history of ESRD on HD (M,W,F), dementia, and secondary hyperparathyroidism who presented to [facility] ED from HD on 1/25 secondary to fever and tachycardia and was found to have an infection of left arm HERO graft s/p temporary dialysis access. Troponin found to be mildly elevated at 0.12-->0.39-->0.11.  _________________  PMH: 1. ESRD on HD (M,W,F) 2. Dementia 3. Secondary hyperparathyroidism _________________   Present Illness 81 year old female with the above problem list who presented to [facility] ED on 12/13/2014 from HD after being found to have a fever and tachycardia.  Both the patient and her son deny her ever seeing a cardiologist. She has known chronic systolic CHF dating back to 06/14/2014. She was admitted to [facility] at that time for UTI and found to be weak. Echo was ordered during that hospitalization 2/2 all of her cultures being negative and her stating that she continued to feel weak. Echo showed an EF of 20-25%, diffuse HK, AK of mid-apicalinferolateral myocardium, GRDD, PASP 44 mm Hg. She was transferred to outside facility as the thought was her dialysis access was infected. There was no follow up for her abnormal echo.   Her dialysis access was found to be blocked in 07/2014. This required patient to have the port unblocked by [facility] on 08/05/14. The patient continued to feel on and off weakness post procedure prompting her to go to [facility] ED. She was evaluated there and felt stable for outpatient follow up.   She presented to [facility] on 1/25 for HD and was found to be febrile with a T max of 101.8 and tachycardic at 139 bpm. Her dialysis asccess was found to be infected. She was seen by Dr. Lucky Cowboy and temporary access was placed in the right femoral vein. She is scheduled to go to the OR today for removal of her current dialysis access.   Labs are significant for the above  troponin levels. WBC count 5.1-->13.5, hgb 11.0-->8.7. CXR showed mild chronic interstital thickening.   Physical Exam:  GEN well developed, no acute distress   HEENT hearing intact to voice   NECK supple   RESP normal resp effort   CARD Regular rate and rhythm  No murmur   ABD denies tenderness  soft   EXTR negative edema   SKIN normal to palpation   NEURO cranial nerves intact   PSYCH alert   Review of Systems:  General: Weight loss or gain  Fatigue  Fever/chills  Weakness   Skin: No Complaints   ENT: No Complaints   Eyes: No Complaints   Neck: No Complaints   Respiratory: No Complaints   Cardiovascular: No Complaints   Gastrointestinal: No Complaints   Genitourinary: No Complaints   Vascular: No Complaints   Musculoskeletal: Muscle or joint pain   Neurologic: No Complaints   Hematologic: No Complaints   Endocrine: No Complaints   Psychiatric: No Complaints   Review of Systems: All other systems were reviewed and found to be negative   Medications/Allergies Reviewed Medications/Allergies reviewed   Family & Social History:  Family and Social History:  Family History Negative  mother and father passed 2/2 complications of old age   Social History negative tobacco, negative ETOH, negative Illicit drugs   Place of Living Home  lives with son at home     COPD:    Dementia:    PVD:    obesity:  pain in limbs:    lympedema:    dialysis:    Renal Failure:    Cholecystectomy:    left arm fistula:   Home Medications: Medication Instructions Status  acetaminophen 325 mg oral tablet 2 tab(s) orally every 4 hours, As Needed - for Pain, or for Fever greater than 100.4   Active  Namenda XR 28 mg oral capsule, extended release 1 cap(s) orally once a day (in the morning) Active  torsemide 20 mg oral tablet 1 tab(s) orally once a day (in the morning) Active  clopidogrel 75 mg oral tablet 1 tab(s) orally once a day Active  Renvela 800  mg oral tablet 2 tab(s) orally 3 times a day (with meals) and 2 with snacks.  Active  Rena-Vite oral tablet 1 tab(s) orally once a day Active  donepezil 10 mg oral tablet 1 tab(s) orally once a day (at bedtime) Active  diazepam 2 mg oral tablet 0.5 tab(s) orally Monday, Wednesday, and Friday (before dialysis) Active   Lab Results:  Routine Chem:  26-Jan-16 03:41   Glucose, Serum 89  BUN  72  Creatinine (comp)  9.11  Sodium, Serum 138  Potassium, Serum 4.3  Chloride, Serum 98  CO2, Serum 27  Calcium (Total), Serum  7.4  Anion Gap 13  Osmolality (calc) 296  eGFR (African American)  5  eGFR (Non-African American)  4 (eGFR values <22m/min/1.73 m2 may be an indication of chronic kidney disease (CKD). Calculated eGFR, using the MRDR Study equation, is useful in  patients with stable renal function. The eGFR calculation will not be reliable in acutely ill patients when serum creatinine is changing rapidly. It is not useful in patients on dialysis. The eGFR calculation may not be applicable to patients at the low and high extremes of body sizes, pregnant women, and vegetarians.)  Magnesium, Serum 1.9 (1.8-2.4 THERAPEUTIC RANGE: 4-7 mg/dL TOXIC: > 10 mg/dL  -----------------------)  Cardiac:  25-Jan-16 13:39   Troponin I  0.12 (0.00-0.05 0.05 ng/mL or less: NEGATIVE  Repeat testing in 3-6 hrs  if clinically indicated. >0.05 ng/mL: POTENTIAL  MYOCARDIAL INJURY. Repeat  testing in 3-6 hrs if  clinically indicated. NOTE: An increase or decrease  of 30% or more on serial  testing suggests a  clinically important change)    18:40   Troponin I  0.39 (0.00-0.05 0.05 ng/mL or less: NEGATIVE  Repeat testing in 3-6 hrs  if clinically indicated. >0.05 ng/mL: POTENTIAL  MYOCARDIAL INJURY. Repeat  testing in 3-6 hrs if  clinically indicated. NOTE: An increase or decrease  of 30% or more on serial  testing suggests a  clinically important change)    21:22   Troponin I  0.41  (0.00-0.05 0.05 ng/mL or less: NEGATIVE  Repeat testing in 3-6 hrs  if clinically indicated. >0.05 ng/mL: POTENTIAL  MYOCARDIAL INJURY. Repeat  testing in 3-6 hrs if  clinically indicated. NOTE: An increase or decrease  of 30% or more on serial  testing suggests a  clinically important change)  Routine Hem:  25-Jan-16 13:39   Hemoglobin (CBC)  11.0  26-Jan-16 03:41   WBC (CBC)  13.5  RBC (CBC)  3.00  Hemoglobin (CBC)  8.7  Hematocrit (CBC)  27.6  Platelet Count (CBC)  124  MCV 92  MCH 29.1  MCHC  31.7  RDW  17.1  Neutrophil % 83.8  Lymphocyte % 6.7  Monocyte % 7.5  Eosinophil % 1.7  Basophil % 0.3  Neutrophil #  11.3  Lymphocyte #  0.9  Monocyte #  1.0  Eosinophil # 0.2  Basophil # 0.0 (Result(s) reported on 14 Dec 2014 at 04:19AM.)   EKG:  EKG Interp. by me   Interpretation EKG shows sinus tachycardia, 123 bpm, left axis deviation, lateral TWI   Radiology Results: XRay:    25-Jan-16 15:35, Chest Portable Single View  Chest Portable Single View   REASON FOR EXAM:    Sepsis  COMMENTS:       PROCEDURE: DXR - DXR PORTABLE CHEST SINGLE VIEW  - Dec 13 2014  3:35PM     CLINICAL DATA:  Dialysis fistula was clotted cough.  Fever.  COPD.    EXAM:  PORTABLE CHEST - 1 VIEW    COMPARISON:  08/09/2014    FINDINGS:  Bilateral diffuse interstitial thickening. No pleural effusion or  pneumothorax. Stable cardiomegaly. Thoracic aortic atherosclerosis.  Large or dialysis catheter projecting over the SVC. Left subclavian  stent is again noted. No acute osseous abnormality.     IMPRESSION:  Mild bilateral chronic interstitial thickening. Mild superimposed  pulmonary edema is not excluded.      Electronically Signed    By: Kathreen Devoid    On: 12/13/2014 15:39         Verified By: Jennette Banker, M.D., MD    Sulfa drugs: N/V/Diarrhea  Aspirin: Hives  Vital Signs/Nurse's Notes: **Vital Signs.:   26-Jan-16 08:00  Temperature Temperature (F) 97.4  Celsius  36.3  Pulse Pulse 64  Respirations Respirations 18  Systolic BP Systolic BP 91  Diastolic BP (mmHg) Diastolic BP (mmHg) 39  Mean BP 56  Pulse Ox % Pulse Ox % 99  Oxygen Delivery 2L; Nasal Cannula    Impression 81 year old female with history of ESRD on HD (M,W,F), dementia, and secondary hyperparathyroidism who presented to [facility] ED from HD on 1/25 secondary to fever and tachycardia and was found to have an infection of left arm HeRO graft s/p temporary dialysis access. Troponin found to be mildly elevated at 0.12-->0.39-->0.41.  1. Elevated troponin: -Likely supply demand ischemia in the setting of underlying CAD and her infection/sepsis -Echo from 06/14/2014 indicated an EF of 20-25%, diffuse HK, with AK of mid-apical inferolateral myocardium indicating a possible prior MI -Presumed CAD-->would plan for cardiac cath once she is stable/free of infection (may be able to be completed as an outpatient) -Optimal medical management (if with optimal medical management and post cardiac cath her EF remains below 35% will need to have ICD discussion) -Check echo to verify EF and wall motion abnormalities    2. Chronic systolic CHF: -Echo 6/75/9163 EF 20-25%, diffuse HK, with AK of mid-apical inferolateral myocardium indicating a possible prior MI, GR2DD -Echo is pending -She is not appear volume overloaded at this time  -Optimal medical management (BP soft 90s/30s-40s limiting medications at this time)  3. Left arm graft infection: -Patient is at high risk for adverse outcome of non-cardiac surgery with an estimated rate of MI, PE, v-fib, cardiac arrest, CHB, PVC, or other arrhythmia at >11%. -She is for surgery today  4. Dementia: -History taken from son  35. Hypotension -HD   Electronic Signatures: Rise Mu (PA-C)  (Signed 26-Jan-16 11:41)  Authored: General Aspect/Present Illness, History and Physical Exam, Review of System, Family & Social History, Past Medical History, Home  Medications, Labs, EKG , Radiology, Allergies, Vital Signs/Nurse's Notes, Impression/Plan Ida Rogue (MD)  (Signed 26-Jan-16 14:08)  Authored: General Aspect/Present Illness, History and Physical Exam, Review of System, Family &  Social History, Labs, EKG , Impression/Plan  Co-Signer: General Aspect/Present Illness, History and Physical Exam, Review of System, Family & Social History, Past Medical History, Home Medications, Labs, EKG , Radiology, Allergies, Vital Signs/Nurse's Notes, Impression/Plan   Last Updated: 26-Jan-16 14:08 by Ida Rogue (MD)

## 2015-03-20 NOTE — Consult Note (Signed)
PATIENT NAME:  [name], [name] MR#:  [id number] DATE OF BIRTH:  [birth year]  DATE OF CONSULTATION:  12/14/2014  REQUESTING PHYSICIAN:  Shaune PollackQing Chen, MD   CONSULTING PHYSICIAN:  Stann Mainlandavid P. Sampson GoonFitzgerald, MD   REASON FOR CONSULTATION:  Fever and likely infected left AV hemodialysis graft.   HISTORY OF PRESENT ILLNESS: An 81 year old with a history of end-stage renal disease on hemodialysis as well as dementia, who was admitted 12/13/2014 with fever and tachycardia. She was at dialysis and apparently her left upper extremity graft was no longer working with a clot. Apparently per the son, this has been an ongoing issue with a nonhealing ulcer over the wound. The plan was to eventually have the graft removed and have a dialysis catheter placed.   PAST MEDICAL HISTORY:  1.  End-stage renal disease on hemodialysis through a left upper extremity AV graft.  2.  Dementia, advanced.  3.  Secondary hyperparathyroidism.  4.  Anxiety.   SURGICAL HISTORY: Placement of left AV graft.   SOCIAL HISTORY: Lives with her son. No tobacco, alcohol or drugs.   FAMILY HISTORY: Noncontributory.   ALLERGIES: ASPIRIN AND SULFA.   REVIEW OF SYSTEMS:  Unable to be obtained due to dementia.   ANTIBIOTICS SINCE ADMISSION: Include Zosyn begun 12/12/2014 as well as vancomycin.   PHYSICAL EXAMINATION:  VITAL SIGNS: Temperature on admission 101.8, currently 97, pulse 67, blood pressure 97/63, respirations 18, saturation 98% on 2 liters.  GENERAL: She is quite thin and frail.  She has dementia and does not provide much history.  HEENT: Pupils are reactive. Sclerae are anicteric. Oropharynx clear.  NECK: Supple.  HEART: Regular.  LUNGS: Clear.  ABDOMEN: Soft, nontender, nondistended, left upper extremity graft has some mild tenderness. There are sutures in place. NEUROLOGIC:  Advanced dementia. Nonfocal neuro exam.   DATA: White blood count 5.1 up to 13.5 today. Hemoglobin 8.7, platelets 124,000.  Blood cultures on 12/13/2014  show 2 of 2 negative.  Clostridium difficile negative. Urinalysis had 3849 white cells.  Clostridium difficile toxin was negative. Lactic acid level 1.8.  LFTs show albumin 2.7, alkaline phosphatase 227, AST 54.   IMAGING: Chest x-ray showed no acute cardiopulmonary disease.   IMPRESSION: An 81 year old female with end-stage renal disease on hemodialysis with a left upper extremity AV graft that nonfunctioning and has had wound issues. It is now clotted and infected.  She was admitted 12/13/2014 with fevers and tachycardia. Clinically she is improved with IV vancomycin and Zosyn. Blood cultures are pending.   RECOMMENDATIONS:  1.  Agree with surgical removal of the graft. We recommend cultures at that time.  2.  Continue current antibiotics.  3.  We will likely need several weeks of IV antibiotics after the graft is removed. This can be based on further culture results.   Thank you for the consult. [name] will be glad to follow with you.     ____________________________ Stann Mainlandavid P. Sampson GoonFitzgerald, MD dpf:DT D: 12/14/2014 14:59:27 ET T: 12/14/2014 15:42:49 ET JOB#: [id number]  cc: Stann Mainlandavid P. Sampson GoonFitzgerald, MD, <Dictator>

## 2015-03-20 NOTE — Op Note (Signed)
PATIENT NAME:  [name], Corena I MR#:  [id number] DATE OF BIRTH:  25-Jun-1931  DATE OF PROCEDURE:  02/07/2015  PREOPERATIVE DIAGNOSES: 1.  End-stage renal disease.  2.  Malposition left thigh PermCath.  3.  Dementia.   POSTOPERATIVE DIAGNOSES:  1.  End-stage renal disease.  2.  Malposition left thigh PermCath.  3.  Dementia.   PROCEDURE: Removal and replacement through same venous access, left thigh PermCath, with the use of fluoroscopic guidance.   SURGEON: Annice NeedyJason S. Tynisha Ogan, M.D.   ANESTHESIA: Local with moderate conscious sedation.   ESTIMATED BLOOD LOSS: Minimal.   INDICATION FOR PROCEDURE: This is an 81 year old female with long-standing end-stage renal disease. We were contacted by her dialysis center who said that the patient had pulled her PermCath out and were prepared to place a new one. On her arrival she had not pulled the catheter completely out, but the cuff was at the skin and this would be an unstable catheter position for long term dialysis use so a replacement was indicated. Risks and benefits were discussed. Informed consent was obtained.   DESCRIPTION OF PROCEDURE: The patient was brought to the vascular suite. The left thigh and existing catheter were sterilely prepped and draped and a sterile surgical field was created. I rewired the catheter and then removed the catheter without difficulty as the cuff was already at exposed skin. The wire was parked into the right atrium. This catheter was removed. Using fluoroscopic guidance, we selected a 43 cm tip to cuff tunneled hemodialysis catheter, placed a peel-away sheath over the Amplatz Super Stiff wire and placed this wire parking the catheter tip at the right atrium. The cuff was about 3 to 4 cm from the skin exit site. At this point, the appropriate distal connectors were placed. It withdrew blood well and flushed easily with heparinized saline and a concentrated heparin solution was placed. I put a tight pursestring suture around  the catheter exit site with a 4-0 Monocryl suture and 2 heavy Prolene sutures to secure the catheter to the leg. Sterile dressing was placed. The patient tolerated the procedure well and was taken to the recovery room in stable condition.   ____________________________ Annice NeedyJason S. Virgin Zellers, MD jsd:sb D: 02/07/2015 12:23:10 ET T: 02/07/2015 13:06:35 ET JOB#: [id number]  cc: Annice NeedyJason S. Chaneka Trefz, MD, <Dictator> Annice NeedyJASON S Leith Szafranski MD ELECTRONICALLY SIGNED 02/07/2015 15:04

## 2015-03-20 NOTE — Op Note (Signed)
PATIENT NAME:  [name], [name] MR#:  [id number] DATE OF BIRTH:  Sep 25, 1931  DATE OF PROCEDURE:  11/30/2014  PREOPERATIVE DIAGNOSES:  1. Complication of arteriovenous dialysis device.  2. End-stage renal disease requiring hemodialysis.  3. Superior vena cava syndrome.   POSTOPERATIVE DIAGNOSES:   1. Complication of arteriovenous dialysis device.  2. End-stage renal disease requiring hemodialysis.  3. Superior vena cava syndrome.   PROCEDURES PERFORMED:   Introduction of catheter into the superior vena cava, right arm approach, with ultrasound guidance.   SURGEON: Renford DillsGregory G. Lauro Manlove, MD.   SEDATION:  Versed plus fentanyl. Continuous ECG, pulse oximetry and cardiopulmonary monitoring is performed throughout the entire procedure by the interventional radiology nurse. Total sedation time is 30 minutes.   ACCESS: A 5 French sheath, right brachial vein.   CONTRAST USED: Isovue 15 mL.   FLUOROSCOPY TIME: 0.8 minutes.   INDICATIONS: [name] is an 81 year old woman who is currently undergoing dialysis via a left arm Hero graft. Attempts at salvaging this graft have been unsuccessful and the patient now has an ulcerated area overlying the graft and therefore, the graft must be removed. She is undergoing evaluation for a new access in preparation. Risks and benefits were reviewed. Family has agreed to proceed.   DESCRIPTION OF PROCEDURE: The patient is taken to special procedures and placed in the supine position. After adequate sedation is achieved, she is positioned supine with her right arm extended, palm upward. The right arm is prepped and draped in a sterile fashion.   Ultrasound is then placed in a sterile sleeve. Ultrasound is utilized secondary to lack of appropriate landmarks and for vascular injury. Under real-time visualization, the brachial vein is noted.  It is echolucent and compressible indicating patency. Image is recorded for the permanent record.  A micropuncture needle is  inserted under direct ultrasound visualization. Micro wire is advanced under fluoroscopy quite easily.   The micro sheath is then inserted and hand injection of contrast is utilized through the micro sheath demonstrating the brachial veins as well as the axillary vein. Subclavian vein is poorly imaged and there appears to be a stricture/stenosis of the innominate. Given this finding, Glidewire is introduced and the micro sheath is upsized to a 5 [place] sheath and a KMP catheter is then advanced through the 5 French sheath and negotiated down through the apparent stenosis and into the SVC. Hand injection of contrast demonstrates the SVC and atrium are widely patent.  As the catheter is then pulled back into the mid subclavian, confirmation is made. There appears to be occlusion of the jugular on the right and a greater than 75% narrowing of the innominate.   The catheter is then removed, sheath is pulled, pressure is held, and there are no immediate complications.   INTERPRETATION: Brachial veins and the veins of the upper portion of the right arm are widely patent.  Axillary vein as well as the subclavian are widely patent and of normal caliber. 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 There is 75% to 80% narrowing of the innominate vein.  SVC and atrium are patent.     ____________________________ Renford DillsGregory G. Sotiria Keast, MD ggs:by D: 11/30/2014 15:23:00 ET T: 11/30/2014 21:07:03 ET JOB#: [id number]  cc: Renford DillsGregory G. Basil Blakesley, MD, <Dictator> Renford DillsGREGORY G Vikkie Goeden MD ELECTRONICALLY SIGNED 12/22/2014 17:41

## 2015-03-20 NOTE — Op Note (Signed)
PATIENT NAME:  [name], [name] MR#:  [id number] DATE OF BIRTH:  1931-09-17  DATE OF PROCEDURE:  02/21/2015  PREOPERATIVE DIAGNOSES:  1.  End-stage renal disease.  2.  Multiple previous access placements with patient recently pulling out PermCath in the left groin.  3.  Dementia.  4.  Chronic obstructive pulmonary disease.   POSTOPERATIVE DIAGNOSES:   1.  End-stage renal disease.  2.  Multiple previous access placements with patient recently pulling out PermCath in the left groin.  3.  Dementia.  4.  Chronic obstructive pulmonary disease.   PROCEDURES:  1.  Ultrasound guidance for vascular access to right femoral vein.  2.  Fluoroscopic guidance for placement of catheter.  3.  Placement of a 31 cm tip to cuff tunneled hemodialysis catheter.   SURGEON: Annice NeedyJason S. Dew, MD  ANESTHESIA: Local with moderate conscious sedation.   ESTIMATED BLOOD LOSS: Minimal.   FLUOROSCOPY TIME:  Less than 1 minute.  CONTRAST USED:  None.   INDICATION FOR PROCEDURE:  This is an 81 year old female with end-stage renal disease. She has dementia and has pulled out multiple dialysis catheters. She has had one in the left groin that she pulled out late last week. We are placing another dialysis access for continued dialysis.   DESCRIPTION OF PROCEDURE: The patient was brought to the vascular suite. Right groin was sterilely prepped and draped and a sterile surgical field was created. The right femoral vein was visualized with ultrasound and found to be widely patent.  It was then accessed under direct ultrasound guidance without difficulty with a Seldinger needle. A J-wire was then placed and a permanent image was recorded.  After skin nick and dilatation, the peel-away sheath was placed over the wire and the wire and dilator were removed. I then anesthetized an area about 5 to 6 cm inferior and lateral to the access site on the anterior thigh. I tunneled from this counter incision to the access site. Using  fluoroscopic guidance, I selected a 31 cm tip to cuff tunneled hemodialysis catheter, placed this through the tunnel, then placed this through the peel-away sheath and the peel-away sheath was removed. The catheter tips were parked just above in the area of T12-L1 in the inferior vena cava near the retrohepatic portion. It withdrew blood well and flushed easily with heparinized saline and a concentrated heparin solution was placed. It was secured to the leg with 2 Prolene sutures, 4-0 Monocryl purse string suture was placed around the exit site and a 4-0 Monocryl suture was used to close the access site. A sterile dressing was placed. The patient tolerated the procedure well and was taken to the recovery room in stable condition.    ____________________________ Annice NeedyJason S. Dew, MD jsd:sp D: 02/21/2015 14:17:11 ET T: 02/21/2015 16:03:55 ET JOB#: [id number]  cc: Annice NeedyJason S. Dew, MD, <Dictator> Annice NeedyJASON S DEW MD ELECTRONICALLY SIGNED 02/23/2015 15:43

## 2015-03-20 NOTE — Consult Note (Signed)
CHIEF COMPLAINT and HISTORY:  Subjective/Chief Complaint infected graft   History of Present Illness Patient is a poor historian known to our service previously for dialysis access.  Her left arm HeRO has had wound issues, and now is frankly infected.  She is being admitted, and will need a temporary catheter while her infection is clearing, and then a Permcath.  Will need excision of her infected graft as well.  She provides little meaningful history.   PAST MEDICAL/SURGICAL HISTORY:  Past Medical History:   COPD:    Dementia:    PVD:    obesity:    pain in limbs:    lympedema:    dialysis:    Renal Failure:    Cholecystectomy:    left arm fistula:   ALLERGIES:  Allergies:  Sulfa drugs: N/V/Diarrhea  Aspirin: Hives  HOME MEDICATIONS:  Home Medications: Medication Instructions Status  acetaminophen 325 mg oral tablet 2 tab(s) orally every 4 hours, As Needed - for Pain, or for Fever greater than 100.4   Active  Namenda XR 28 mg oral capsule, extended release 1 cap(s) orally once a day (in the morning) Active  torsemide 20 mg oral tablet 1 tab(s) orally once a day (in the morning) Active  clopidogrel 75 mg oral tablet 1 tab(s) orally once a day Active  Renvela 800 mg oral tablet 2 tab(s) orally 3 times a day (with meals) and 2 with snacks.  Active  Rena-Vite oral tablet 1 tab(s) orally once a day Active  donepezil 10 mg oral tablet 1 tab(s) orally once a day (at bedtime) Active  diazepam 2 mg oral tablet 0.5 tab(s) orally Monday, Wednesday, and Friday (before dialysis) Active   Family and Social History:  Family History Non-Contributory   Social History negative tobacco, negative ETOH, negative Illicit drugs   Place of Living Home   Review of Systems:  ROS Pt not able to provide ROS   Medications/Allergies Reviewed Medications/Allergies reviewed   Physical Exam:  GEN well developed, well nourished, no acute distress, elderly   HEENT pink conjunctivae,  decreased hearing   NECK No masses  trachea midline   RESP normal resp effort  no use of accessory muscles   CARD irregular rate  no thrills  no JVD   VASCULAR ACCESS Tenderness around access site  Purulent drainage  left arm HeROwith open wound   ABD denies tenderness  soft   GU no superpubic tenderness   LYMPH negative neck, negative axillae   EXTR negative cyanosis/clubbing, negative edema   SKIN No ulcers, skin turgor poor, over left arm HeRO   NEURO cranial nerves intact, motor/sensory function intact   PSYCH poor insight   LABS:  Laboratory Results: Hepatic:    25-Jan-16 13:39, Comprehensive Metabolic Panel  Bilirubin, Total 0.9  Alkaline Phosphatase 227  46-116  NOTE: New Reference Range  06/08/14  SGPT (ALT) 24  14-63  NOTE: New Reference Range  06/08/14  SGOT (AST) 54  Total Protein, Serum 7.4  Albumin, Serum 2.7  Routine Chem:  Glucose, Serum 98  BUN 63  Creatinine (comp) 8.89  Sodium, Serum 135  Potassium, Serum 4.5  Chloride, Serum 98  CO2, Serum 25  Calcium (Total), Serum 8.0  Osmolality (calc) 288  eGFR (African American) 5  eGFR (Non-African American) 5  eGFR values <54m/min/1.73 m2 may be an indication of chronic  kidney disease (CKD).  Calculated eGFR, using the MRDR Study equation, is useful in   patients with stable renal function.  The  eGFR calculation will not be reliable in acutely ill patients  when serum creatinine is changing rapidly. It is not useful in  patients on dialysis. The eGFR calculation may not be applicable  to patients at the low and high extremes of body sizes, pregnant  women, and vegetarians.  Anion Gap 12    25-Jan-16 13:39, Magnesium, Serum  Magnesium, Serum 1.9  1.8-2.4  THERAPEUTIC RANGE: 4-7 mg/dL  TOXIC: > 10 mg/dL   -----------------------    25-Jan-16 13:39, Phosphorus, Serum  Phosphorus, Serum 6.8  Result(s) reported on 13 Dec 2014 at 03:48PM.    25-Jan-16 13:39, Troponin I  Result Comment    TROPONIN - RESULTS VERIFIED BY REPEAT TESTING.   - [facility] CALLED MELISSA DEAN @ 1610 12/13/14   - READ-BACK PROCESS PERFORMED.   Result(s) reported on 13 Dec 2014 at 04:15PM.  Cardiac:  Troponin I 0.12  0.00-0.05  0.05 ng/mL or less: NEGATIVE   Repeat testing in 3-6 hrs   if clinically indicated.  >0.05 ng/mL: POTENTIAL   MYOCARDIAL INJURY. Repeat   testing in 3-6 hrs if   clinically indicated.  NOTE: An increase or decrease   of 30% or more on serial   testing suggests a   clinically important change  Routine UA:    25-Jan-16 14:10, Urinalysis  Color (UA) Yellow  Clarity (UA) Turbid  Glucose (UA) 50 mg/dL  Bilirubin (UA) Negative  Ketones (UA) Negative  Specific Gravity (UA) 1.006  Blood (UA) 1+  pH (UA) 7.0  Protein (UA)   100 mg/dL  Nitrite (UA) Negative  Leukocyte Esterase (UA) 3+  Result(s) reported on 13 Dec 2014 at 04:05PM.  RBC (UA) 69 /HPF  WBC (UA)   3849 /HPF  Bacteria (UA)   NONE SEEN  Epithelial Cells (UA) 103 /HPF  WBC Clump (UA) PRESENT  Result(s) reported on 13 Dec 2014 at 04:05PM.  Routine Coag:    25-Jan-16 13:39, Prothrombin Time  Prothrombin 15.3  INR 1.2  INR reference interval applies to patients on anticoagulant therapy.  A single INR therapeutic range for coumarins is not optimal for all  indications; however, the suggested range for most indications is  2.0 - 3.0.  Exceptions to the INR Reference Range may include: Prosthetic heart  valves, acute myocardial infarction, prevention of myocardial  infarction, and combinations of aspirin and anticoagulant. The need  for a higher or lower target INR must be assessed individually.  Reference: The Pharmacology and Management of the Vitamin K   antagonists: the seventh ACCP Conference on Antithrombotic and  Thrombolytic Therapy. WUJWJ.1914 Sept:126 (3suppl): N9146842.  A HCT value >55% may artifactually increase the PT.  In one study,   the increase was an average of 25%.  Reference:  "Effect  on Routine and Special Coagulation Testing Values  of Citrate Anticoagulant Adjustment in Patients with High HCT Values."  American Journal of Clinical Pathology 2006;126:400-405.  Routine Hem:    25-Jan-16 13:39, Hemogram, Platelet Count  WBC (CBC) 5.1  RBC (CBC) 3.77  Hemoglobin (CBC) 11.0  Hematocrit (CBC) 34.4  Platelet Count (CBC) 132  Result(s) reported on 13 Dec 2014 at 02:00PM.  MCV 91  MCH 29.1  MCHC 31.8  RDW 17.2   RADIOLOGY:  Radiology Results: XRay:    31-Mar-15 16:13, Chest PA and Lateral  Chest PA and Lateral  REASON FOR EXAM:    kidney disease  COMMENTS:       PROCEDURE: DXR - DXR CHEST PA (OR AP) AND LATERAL  -  Feb 16 2014  4:13PM     CLINICAL DATA:  Unsteady    EXAM:  CHEST  2 VIEW    COMPARISON:  Prior radiograph 02/05/2014    FINDINGS:  Left-sided dialysis catheter is unchanged. Vascular stent overlies  the left chest. Cardiomegaly is stable. Atherosclerotic  calcifications noted within the aortic arch.    Lungs are normally inflated. Chronic coarsening of the interstitial  markings noted. No pulmonary edema or focal infiltrate. Minimal  blunting of left costophrenic angle may represent a small left  pleural effusion. No pneumothorax.    No acute osseus abnormality. Remote right-sided rib fractures noted.  Diffuse osteopenia.     IMPRESSION:  1. Blunting of the left costophrenic angle, suggestive of small left  pleural effusion.  2. Stable cardiomegaly without pulmonary edema or focal infiltrate.  Electronically Signed    By: Jeannine Boga M.D.    On: 02/16/2014 16:14         Verified By: Neomia Glass, M.D.,    15-May-15 13:02, Chest Portable Single View  Chest Portable Single View  REASON FOR EXAM:    cough/congestion; lethargy; unable to receive   dialysis today  COMMENTS:       PROCEDURE: DXR - DXR PORTABLE CHEST SINGLE VIEW  - Apr 02 2014  1:02PM     CLINICAL DATA:  Cough, congestion and,  lethargy    EXAM:  PORTABLE CHEST - 1 VIEW    COMPARISON:  02/16/2014    FINDINGS:  Interval removal of the left IJ dialysis catheter. Hero AV graft  noted, terminating at SVC innominate junction level. Cardiomegaly  noted with increased diffuse asymmetric interstitial edema pattern,  worse in the right lung. Small effusions suspected with basilar  atelectasis, worse on the left. No pneumothorax. Atherosclerosis of  the aorta. Left axillary subclavian stent present.     IMPRESSION:  Mild CHF pattern.      Electronically Signed    By: Daryll Brod M.D.    On: 04/02/2014 13:20       Verified By: Earl Gala, M.D.,    25-Jan-16 15:35, Chest Portable Single View  Chest Portable Single View  REASON FOR EXAM:    Sepsis  COMMENTS:       PROCEDURE: DXR - DXR PORTABLE CHEST SINGLE VIEW  - Dec 13 2014  3:35PM     CLINICAL DATA:  Dialysis fistula was clotted cough.  Fever.  COPD.    EXAM:  PORTABLE CHEST - 1 VIEW    COMPARISON:  08/09/2014    FINDINGS:  Bilateral diffuse interstitial thickening. No pleural effusion or  pneumothorax. Stable cardiomegaly. Thoracic aortic atherosclerosis.  Large or dialysis catheter projecting over the SVC. Left subclavian  stent is again noted. No acute osseous abnormality.     IMPRESSION:  Mild bilateral chronic interstitial thickening. Mild superimposed  pulmonary edema is not excluded.      Electronically Signed    By: Kathreen Devoid    On: 12/13/2014 15:39         Verified By: Jennette Banker, M.D., MD  LabUnknown:    31-Mar-15 16:13, Chest PA and Lateral  PACS Image    15-May-15 13:02, Chest Portable Single View  PACS Image    25-Jan-16 15:35, Chest Portable Single View  PACS Image   ASSESSMENT AND PLAN:  Assessment/Admission Diagnosis ESRD, infected HeRO graft Dementia and multiple other issues as above   Plan Will place temp cath today Will need HeRO excised, likely tomorrow  in the OR Once infection is cleared,  will need a Permcath likely late this week or early next week Given her multiple issues, advanced age and dementia, she has high risk of problems including wound healing and cardiopulmonary complications   level 4 consult   Electronic Signatures: Algernon Huxley (MD)  (Signed 25-Jan-16 17:05)  Authored: Chief Complaint and History, PAST MEDICAL/SURGICAL HISTORY, ALLERGIES, HOME MEDICATIONS, Family and Social History, Review of Systems, Physical Exam, LABS, RADIOLOGY, Assessment and Plan   Last Updated: 25-Jan-16 17:05 by Algernon Huxley (MD)

## 2015-03-20 NOTE — Op Note (Signed)
PATIENT NAME:  [name], [name] MR#:  [id number] DATE OF BIRTH:  12-05-1930  DATE OF PROCEDURE:  02/23/2015  PREOPERATIVE DIAGNOSES:  1.  End-stage renal disease requiring hemodialysis.  2.  Superior vena cava syndrome.  3.  Multiple complication of vascular device.   POSTOPERATIVE DIAGNOSES:   1.  End-stage renal disease requiring hemodialysis.  2.  Superior vena cava syndrome.  3.  Multiple complication of vascular device.   PROCEDURE PERFORMED:  1.  Creation of right brachial subclavian HeRO graft.  2.  Introduction catheter into the SVC.  3.  Percutaneous transluminal angioplasty to 12 mm right innominate vein.   PROCEDURE PERFORMED BY: Katha Cabal, MD   ANESTHESIA: General by LMA.   FLUIDS: Per anesthesia record.   ESTIMATED BLOOD LOSS: Minimal.   SPECIMEN: None.   INDICATIONS: [name] is an 81 year old woman who is currently on hemodialysis via right femoral tunneled catheter. She has dementia and has removed multiple catheters. In fact this is the third one in the last 2 weeks, which was placed several days ago. Risks and benefits for creation of a right arm HeRO graft have been reviewed with the patient and her son. All questions have been answered. They are in agreement with proceeding.   DESCRIPTION OF PROCEDURE: The patient is taken to the operating room and placed in the supine position. After adequate general anesthesia is induced, appropriate invasive monitors are placed, she is positioned supine with her neck extended slightly and rotated to the left. The neck, chest wall, and right arm is then prepped and draped in a sterile fashion. Appropriate timeout is called.   Ultrasound is placed in a sterile sleeve. The right neck and base in the supraclavicular area are interrogated with ultrasound. Adequate venous conduit is not identified. Ultimately, from the infraclavicular approach in a longitudinal access the vein is identified and therefore a microneedle from a  micropuncture kit is inserted into the vein under direct visualization. Microwire is then advanced without difficulty under fluoroscopic guidance down into the atrium. Microsheath is then inserted, small counter incision is created, J-wire is advanced, and subsequently an 8 [place] sheath.   Hand injection of contrast is used to demonstrate the inferior vena cava, but subsequently a catheter was introduced to demonstrate the superior vena cava and atrium, which were proximal to the stenosis. Catheter was then subsequently used to direct the wire into the inferior vena cava and an Amplatz Super Stiff wire was positioned through the heart into the distal IVC. Working through an 8 [place] sheath, first a 10 x 6 balloon and then a 12 x 6 balloon were used to angioplasty the innominate stenosis. The inflations were to 12 atmospheres for 1 full minute. Followup imaging after each inflation demonstrated steady improvement in the narrowing. After the 12 it did appear that there would be adequate luminal gain to position HeRO. Therefore, dilators were passed over the wire and subsequently the dilator and peel-away sheath were inserted.   The intravascular portion of the HeRO graft, silicon with reinforced braided wire piece, is then opened and prepped on the back table. It is then advanced over the wire through the peel-away sheath removing the peel-away sheath as the silicon portion is advanced passing it down in so that the tip is in the inferior vena cava. Wire is then removed as is the dilator and this portion is flushed with heparinized saline and clamped.   Over the deltopectoral groove, linear incision is created and the dissection  is carried down through the soft tissues to expose the fascia. Small pocket is then fashioned where the graft itself will enter into this incision.   The brachial artery is then exposed approximately 1 fingerbreadth above the antecubital crease and the dissection is carried down  through the soft tissues and fascia to expose the brachial artery, which was looped proximally and distally with a Silastic vessel loop.   Impra tunneler is then used to create a tunnel subcutaneously from the artery to the counterincision at the shoulder and the 6 mm PTFE portion of the HeRO graft is pulled through leaving the grommet at the level of the shoulder. The graft once it is in position is checked by palpation for adequate positioning and smooth contour. It is then approximated to the artery in its native bed, marked, and subsequently transected with scissors.   The brachial artery is then delivered into the operative field, arteriotomy is made, extended with Potts scissors, a stay suture of 6-0 Prolene is placed, and subsequently an end graft to side brachial artery anastomosis was fashioned with a running CV-6 suture. Flushing maneuvers are performed, and subsequently the PTFE portion is pressurized. It is checked once again for proper positioning and then irrigated with heparinized saline and a vascular clamp is used to occlude it just above the suture line.   The silicon portion is then pulled subcutaneously from its insertion site to the shoulder counter incision. X-ray is brought back into the room and the tip is positioned in the mid atrium. It is then transected and connected to the grommet and secured with an 0 Ethibond. The graft is then unclamped and an excellent thrill is noted. The graft is then checked in its entirety under fluoroscopy and noted to have a smooth contour free of any kinks with the tip in proper position. All three incisions are then irrigated and closed in layers using 3-0 Vicryl for the subcutaneous tissues followed by 4-0 Monocryl subcuticular and then Dermabond.   The patient tolerated the procedure well. There were no immediate complications. Sponge and needle counts are correct and she is taken to the recovery area in excellent condition.     ____________________________ Katha Cabal, MD ggs:AT D: 02/23/2015 16:40:08 ET T: 02/24/2015 00:14:09 ET JOB#: [id number]  cc: Katha Cabal, MD, <Dictator> Katha Cabal MD ELECTRONICALLY SIGNED 03/01/2015 15:13

## 2015-03-20 NOTE — Op Note (Signed)
PATIENT NAME:  [name], [name] I MR#:  [id number] DATE OF BIRTH:  1930/12/29  DATE OF PROCEDURE:  12/14/2014  PREOPERATIVE DIAGNOSES:  1.  Infected brachial artery HeRO graft.  2.  Brachial artery aneurysm.   POSTOPERATIVE DIAGNOSES: 1.  Infected brachial artery HeRO graft.  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   PROCEDURE PERFORMED: 1.  Resection of tandem brachial artery aneurysms with brachial-to-brachial bypass grafting using Artegraft.  2.  Excision of the HeRO graft.   SURGEON: Renford Dills, MD   ANESTHESIA: General by LMA.   FLUIDS: Per anesthesia record.   ESTIMATED BLOOD LOSS: 225 mL.   SPECIMENS:  1.  Both the initial brachial artery aneurysm as well as the secondary aneurysm are sent to pathology as separate specimens.  2.  The excised HeRO graft is displayed on the back table and photographed for the permanent record.   INDICATIONS: Ms. Hartney is an 81 year old woman with multiple medical problems who presented with signs and symptoms of sepsis. Risks and benefits for excision of her AV graft were discussed. By a clinical palpation there is a large aneurysmal portion of the brachial artery at the anastomotic area; this is quite obvious by physical examination and this repair was discussed as well. The family wishes for [place] to proceed with surgery.   DESCRIPTION OF PROCEDURE: The patient is taken to the operating room and placed in the supine position. After adequate general anesthesia is induced and appropriate invasive monitors are placed, she is positioned supine with her left arm extended palm upward. The left arm is prepped and draped in a sterile fashion. Appropriate timeout is called.   Initially, working at the shoulder level a small incision is created through the previous scar and the grommet is exposed. The silicon portion of the HeRO graft is then removed without  difficulty and a pursestring suture of 3-0 Vicryl is placed around the track. The PTFE portion with the ring segment is then dissected bluntly, ligated with an 0 Ethibond, and then transected with scissors and allowed to retract down toward the arm. This area too was then oversewn and the incision was closed in one more layer using 3-0 Vicryl.   Attention was then turned to the large aneurysmal area involving the brachial artery, a linear incision was created through the previous incisional scar but extending it several centimeters proximally as well as several centimeters distally. The dissection was then carried down and the brachial artery proximal to the aneurysm was exposed as well as brachial artery proximal to the secondary aneurysm, which was encountered during the dissection. These 2 aneurysms are separated by approximately 4 cm. Both the proximal and distal brachial artery were then looped with Silastic vessel loops for vascular control. The dissection was then extended along the brachial artery until the two were connected and both aneurysms had been freely mobilized. At this point 2000 units heparin were given and allowed to circulate for 4 minutes. The proximal and distal brachial arteries were then clamped, and the aneurysms, initially the large one was excised, the small one was then opened using Potts scissors extending the arteriotomy distally. Viabahn stent was encountered, which was apparently utilized to seal this aneurysm in the past. There did appear to be near total occlusion of the Viabahn with a hyperplastic lesion distally. The Viabahn stent was removed at this time as well as debriding the aneurysm distally, and these 2  specimens were separated. The stent was kept with the distal aneurysm. Total length of the arteriotomy now exceeded 4 cm and given the status of the artery itself, it did not appear that patching would be a viable alternative; therefore, an Artegraft was delivered onto  the back table. It was prepared per the instructions, flushing it repeatedly with saline, ultimately flushing it with heparinized saline one final time. The graft was then beveled and an end-to-end proximal brachial artery-to-Artegraft anastomosis was fashioned with running 6-0 Prolene. It was then delivered distally and an end-to-end anastomosis was fashioned. It appeared to have an anatomic lie to it and, therefore, the wound was irrigated with 500 mL of GU irrigant. At this time, I elected to reapproximate the tissues over the bypass to prevent contamination from the infected graft; however; on reapproximating the fascial level, it appeared that the graft would not lay straight. Therefore, the graft was once again clamped and approximately 1 cm of graft was resected and an end-to-end anastomosis was fashioned with running 6-0 Prolene. Flushing maneuvers were performed and flow was re-established with good result and reapproximation of the fascia and then subcutaneous tissues was completed with running 3-0 Vicryl.   A linear incision was then created overlying the infected portion of the graft and the graft bluntly dissected. Two more counterincisions were also required along the course of the graft to allow for the graft to be removed in its entirety. The most proximal wound was then closed with a few staples, as was the shoulder incision. The incision overlying the brachial artery repair was closed with widely spaced staples. A VAC sponge is placed in the 2 remaining open incisions, and a sponge was placed across the staple line of the brachial artery repair. VAC dressing was then applied with good seal. The patient tolerated the procedure well and there were no immediate complications. Sponge and needle counts were correct, and she was taken to the recovery area in stable condition.    ____________________________ Renford DillsGregory G. Madicyn Mesina, MD ggs:bm D: 12/14/2014 22:27:25 ET T: 12/15/2014 00:29:36  ET JOB#: [id number]  cc: Renford DillsGregory G. Garnette Greb, MD, <Dictator> Renford DillsGREGORY G Faylene Allerton MD ELECTRONICALLY SIGNED 12/22/2014 17:43

## 2015-03-20 NOTE — H&P (Signed)
PATIENT NAME:  [name], [name] MR#:  [id number] DATE OF BIRTH:  [birth year]  PRIMARY CARE PHYSICIAN:  Dr. Juanetta GoslingHawkins  REFERRING PHYSICIAN:  Charlestine NightPhillip A. Scotty CourtStafford, MD  CHIEF COMPLAINT: Fever and tachycardia today.   HISTORY OF PRESENT ILLNESS: An 81 year old Caucasian female with a history of dementia and ESRD on hemodialysis, was sent from home due to fever and tachycardia today. The patient is demented, unable to provide any information. The patient just says she is sick, but denies any other symptoms. According to patient's son, patient recently got a left arm AV graft procedure. She went to hemodialysis today and was found to have AV graft clot, fever and tachycardia. The patient was sent to ED for further evaluation. Dr. Gilda CreaseSchnier suggested to get temprary dialysis cath. The patient got antibiotics in the ED.   PAST MEDICAL HISTORY: ESRD, dementia, secondary hyperparathyroidism, anxiety.   SOCIAL HISTORY: Lives at home with her son. No smoking or drinking or illicit drugs.   FAMILY HISTORY: Mother and father are both deceased and they died of complications of old age.   ALLERGIES: ASPIRIN, SULFA DRUGS.   HOME MEDICATIONS: Torsemide 20 mg p.o. daily, Renvela 800 mg p.o. t.[name].d., Rena-Vite  oral tablets 1 tablet once a day, Namenda XR 28 mg p.o. daily, donepezil 10 mg p.o. once a day at bedtime, diazepam 0.5 mg p.o. on Monday, Wednesday, Friday before dialysis, Plavix 75 mg p.o. daily, acetaminophen 325 mg 2 tablets every 4 hours p.r.n.   REVIEW OF SYSTEMS:  The patient is demented, unable to obtain.   PHYSICAL EXAMINATION: VITAL SIGNS: Temperature 101.8, blood pressure 103/63, pulse 106, oxygen saturation 96% oxygen.  GENERAL: The patient is demented, but in no acute distress.  HEENT: Pupils round, equal and reactive to light and accommodation. Moist oral mucosa. Clear oropharynx.  NECK: Supple. No JVD or carotid bruit. No lymphadenopathy. No thyromegaly.  CARDIOVASCULAR: S1 and S2, regular rate,  tachycardia, no murmurs or gallops.  PULMONARY: Bilateral air entry, no wheezing or rales. No use of accessory muscle to breathe.  ABDOMEN: Soft. No distention or tenderness. No organomegaly. Bowel sounds present.  EXTREMITIES: No edema, clubbing, or cyanosis. No calf tenderness. Bilateral pedal pulses are present. Left arm AV fistula without bruit.  SKIN: No rash or jaundice.  NEUROLOGY: The patient is demented, but follows commands. No focal deficit. Power 5/5. Sensation intact.   LABORATORY DATA: Glucose 98, BUN 33, creatinine 8.89, sodium 135, potassium 4.5, chloride 98, bicarbonate 25, troponin 0.12. WBC 5.1, hemoglobin 11.0, platelets 132,000. INR 1.2. Urinalysis showed WBC 3849, RBCs 69, nitrite negative.   EKG showed sinus tachycardia at 123.   IMPRESSIONS: 1.  Sepsis.  2.  Arteriovenous graft infection.  3.  End-stage renal disease, on hemodialysis.  4.  Hyponatremia.  5.  Anemia.  6.  Chronic thrombocytopenia. 7.  Dementia.    PLAN OF TREATMENT: 1.  The patient is going to be transported to special unit for temporary dialysis cath. Follow up with Dr. Wyn Quakerew. 2.  Will continue vancomycin and Zosyn, dosed per pharmacy. 3.  Follow up CBC, BMP.  4. For urinary tract infection, as mentioned above, patient will get antibiotics and will follow up urine culture.  5.  For dementia, continue dementia medication.  6.  [name] discussed the patient's condition and plan of treatment with the patient's son, who is health power of attorney. He said he wants the patient in full code.  7.  Also elevated troponin, possibly due to ESRD. The patient denies any  chest pain. Will get a cardiology consult. Follow up troponin level.   TIME SPENT: About 58 minutes.    ____________________________ Shaune Pollack, MD qc:LT D: 12/13/2014 16:49:23 ET T: 12/13/2014 17:08:03 ET JOB#: [id number]  cc: Shaune Pollack, MD, <Dictator> Shaune Pollack MD ELECTRONICALLY SIGNED 12/13/2014 20:16

## 2015-03-20 NOTE — Discharge Summary (Signed)
PATIENT NAME:  [name], [name] MR#:  [id number] DATE OF BIRTH:  September 06, 1931  DATE OF ADMISSION:  12/13/2014 DATE OF DISCHARGE:  12/17/2014  DISCHARGE DIAGNOSES:  1.  Escherichia coli sepsis due to graft infection, status post HeRO graft removal on January 26 and new permanent catheter placement on January 29.  2.  Chronic systolic heart failure, well compensated at this time. 3.  Elevated troponin, likely due to supply/demand ischemia.  4.  Urinary tract infection.   SECONDARY DIAGNOSIS:   ESRD, dementia, secondary hyperparathyroidism, anxiety.    CONSULTATIONS:  1.  Infectious disease, Stann MainlandDavid P. Sampson GoonFitzgerald, MD. 2.  Vascular surgery, Renford DillsGregory G. Schnier, MD. 3.  Cardiology, Antonieta Ibaimothy J. Gollan, MD. 4.  Nephrology, Munsoor Lizabeth LeydenN. Lateef, MD.  PROCEDURES AND RADIOLOGY:  1.  Temporary non-tunneled dialysis catheter in the right femoral vein by Dr. Wyn Quakerew on January 25. 2.  Excision of infected brachial HeRO dialysis graft by Dr. Gilda CreaseSchnier January 26.  3.  Permanent catheter in the brachial tunneled dialysis catheter in the left thigh with removal of left VAC with wet-and-dry dressing placement by Dr. Wyn Quakerew on January 29.  4.  Chest x-ray on January 25 showed mild bilateral chronic interstitial thickening. Mild superimposed pulmonary edema not excluded.  5.  A 2-D echocardiogram on January 26 showed LVEF of 30% to 35%. Impaired relaxation pattern of LV diastolic filling. Global hypokinesis. Mildly increased LV internal cavity size. Mildly dilated left atrium. Moderate mitral valve regurgitation. Normal RV systolic pressure.   MAJOR LABORATORY PANEL: UA on admission showed 3849 wbc's, 3+ leukocyte esterase, but no bacteria. WBCs in clumps seen.   Blood culture in 4/4 bottles were growing Escherichia coli. Stool for Clostridium difficile was negative.   Blood culture repeated on the January 27 x 2 were negative.   Intact PTH was elevated with a value of 205.   HBsAg was negative.   HISTORY AND SHORT  HOSPITAL COURSE: The patient is an 81 year old female with the above-mentioned medical problems who was admitted for fever and tachycardia, thought to be from sepsis present on admission. Source of infection was AV graft which was used for dialysis. Please see Dr. Nicky Pughhen's dictated history and physical for further details. Vascular surgery consultation was obtained with Dr. Festus BarrenJason Dew who placed a temporary dialysis catheter through which dialysis was started for inpatient hemodialysis. Her permanent graft which was infected was removed by Dr. Gilda CreaseSchnier. The patient was evaluated by cardiology, Dr. Julien Nordmannimothy Gollan, due to elevated cardiac enzyme which was thought to be due to supply/demand ischemia. He recommended 2-D echocardiogram, which was performed with results dictated above. He did recommend at some point the patient getting ischemic cardiac workup, as the patient had significant LV dysfunction and wall motion abnormality. The patient was evaluated by infectious disease, Dr. Clydie Braunavid Fitzgerald, who recommended continuing broad-spectrum antibiotic and repeating blood culture to make sure the infection is getting cleared up, which was done. Blood cultures were repeated on January 27. The patient underwent placement of new permanent dialysis catheter by Dr. Gilda CreaseSchnier on January 29. She received dialysis subsequent to that and was discharged home in stable condition, as she did not have any further signs of infection. On the date of discharge, her vital signs are as follows: Temperature 97.8, heart rate 80 per minute,  respirations 19 per minute, blood pressure 113/63. She was saturating 97% to 98% on room air.   PERTINENT PHYSICAL EXAMINATION ON THE DATE OF DISCHARGE:  CARDIOVASCULAR: S1, S2 normal. No murmurs, rubs, or gallop.  LUNGS: Clear to auscultation bilaterally. No wheezing, rales, rhonchi, crepitation.  ABDOMEN: Soft, benign.  NEUROLOGIC: Nonfocal examination. All other physical examination remained at  baseline.   DISCHARGE MEDICATIONS:   Medication Instructions  rena-vite oral tablet  1 tab(s) orally once a day   donepezil 10 mg oral tablet  1 tab(s) orally once a day (at bedtime)   diazepam 2 mg oral tablet  0.5 tab(s) orally Monday, Wednesday, and Friday (before dialysis)   acetaminophen 325 mg oral tablet  2 tab(s) orally every 4 hours, As Needed - for Pain, or for Fever greater than 100.4     namenda xr 28 mg oral capsule, extended release  1 cap(s) orally once a day (in the morning)   torsemide 20 mg oral tablet  1 tab(s) orally once a day (in the morning)   clopidogrel 75 mg oral tablet  1 tab(s) orally once a day   renvela 800 mg oral tablet  2 tab(s) orally 3 times a day (with meals) and 2 with snacks.    vancomycin  500 milligram(s) IV at each Hemodialysis session for 2 weeks   cipro 250 mg oral tablet  1 tab(s) orally every 12 hours x 10 days   lipitor 40 mg oral tablet  1 tab(s) orally once a day (at bedtime)   isosorbide mononitrate 30 mg oral tablet, extended release  1 tab(s) orally once a day (in the morning)   hydralazine 10 mg oral tablet  1 tab(s) orally 3 times a day   carvedilol 3.125 mg oral tablet  1 tab(s) orally 2 times a day    DISCHARGE DIET: Dialysis diet.   DISCHARGE ACTIVITY: As tolerated.   DISCHARGE INSTRUCTIONS AND FOLLOWUP: The patient was instructed to follow up with Dr. Clydie Braun from infectious disease in 1-2 weeks. She will need followup with Dr. Kari Baars, her primary care physician, in 2-4 weeks. She will need followup with Dr. Levora Dredge from vascular surgery in 4-6 weeks. She will need followup with Dr. Julien Nordmann from cardiology in 2-3 weeks. She was requested to get hemodialysis as scheduled as an outpatient. She was set up to get home health, physical therapy, and nursing. She was also instructed to get wet-to-dry dressing on her wound and nursing was set up for skilled assessment. She was also setup for physical therapy for  ambulation toward debility.   TOTAL TIME DISCHARGING THIS PATIENT: Fifty-five minutes.  Please note, the patient remains at high risk for readmission.    ____________________________ Ellamae Sia. Sherryll Burger, MD vss:TT D: 12/19/2014 18:00:56 ET T: 12/19/2014 18:49:11 ET JOB#: [id number]  cc: Jaice Lague S. Sherryll Burger, MD, <Dictator> [name]. Juanetta Gosling, MD Renford Dills, MD Stann Mainland. Sampson Goon, MD Munsoor Lizabeth Leyden, MD Antonieta Iba, MD Ellamae Sia [facility] [place] [place] MD ELECTRONICALLY SIGNED 12/20/2014 12:51

## 2015-03-20 NOTE — Op Note (Signed)
PATIENT NAME:  [name], Deanna I MR#:  [id number] DATE OF BIRTH:  [birth year]  DATE OF PROCEDURE:  01/13/2015  PREOPERATIVE DIAGNOSES:  1.  End-stage renal disease.  2.  Dementia.  3.  Nonfunctional left thigh PermCath with exposed cuff.   POSTOPERATIVE DIAGNOSES:  1.  End-stage renal disease.  2.  Dementia.  3.  Nonfunctional left thigh PermCath with exposed cuff.   PROCEDURES:  1.  Removal and replacement through same venous access using a 44 cm tipped cuff tunneled hemodialysis catheter, left femoral vein.  2.  Fluoroscopic guidance for placement of catheter.   SURGEON: Annice NeedyJason S. Dew, MD   ANESTHESIA: Local with sedation.   ESTIMATED BLOOD LOSS: Minimal.  FLUOROSCOPY TIME: Approximately 1 minute.  CONTRAST USED: None.   INDICATION FOR PROCEDURE: An 81 year old female who has had her PermCath pulled out to the point where the cuff is exposed and it is no longer usable. This will need to be replaced. Risks and benefits were discussed. Informed consent was obtained.   DESCRIPTION OF THE PROCEDURE: The patient is brought to the vascular suite. Left thigh existing catheter was sterilely prepped and draped, and a sterile surgical field was created. The area was anesthetized copiously with 1% lidocaine. I rewired this with an Amplatz Super Stiff wire and removed the existing catheter. I then re-placed a 44 cm tipped cuff tunneled hemodialysis catheter, parking the catheter tip in the retrohepatic vena cava just below the right atrium without kinks. The appropriate distal connectors were placed. It withdrew blood and flushed easily with heparinized saline, and a concentrated heparin solution was then placed. It was secured to the leg with 2 Prolene sutures. A 4-0 Monocryl pursestring suture was placed around the catheter exit site. The patient tolerated the procedure well and was taken to the recovery room in stable condition.   ____________________________ Annice NeedyJason S. Dew,  MD jsd:ST D: 01/13/2015 16:07:19 ET T: 01/14/2015 02:36:36 ET JOB#: [id number]  cc: Annice NeedyJason S. Dew, MD, <Dictator> Annice NeedyJASON S DEW MD ELECTRONICALLY SIGNED 01/27/2015 10:34

## 2015-04-05 ENCOUNTER — Ambulatory Visit (INDEPENDENT_AMBULATORY_CARE_PROVIDER_SITE_OTHER): Payer: Medicare HMO | Admitting: Cardiovascular Disease

## 2015-04-05 ENCOUNTER — Encounter: Payer: Self-pay | Admitting: Cardiovascular Disease

## 2015-04-05 VITALS — BP 140/60 | HR 71 | Ht 61.0 in | Wt 116.5 lb

## 2015-04-05 DIAGNOSIS — I2510 Atherosclerotic heart disease of native coronary artery without angina pectoris: Secondary | ICD-10-CM | POA: Diagnosis not present

## 2015-04-05 DIAGNOSIS — N186 End stage renal disease: Secondary | ICD-10-CM

## 2015-04-05 DIAGNOSIS — F0390 Unspecified dementia without behavioral disturbance: Secondary | ICD-10-CM | POA: Diagnosis not present

## 2015-04-05 DIAGNOSIS — J449 Chronic obstructive pulmonary disease, unspecified: Secondary | ICD-10-CM

## 2015-04-05 DIAGNOSIS — I5022 Chronic systolic (congestive) heart failure: Secondary | ICD-10-CM

## 2015-04-05 DIAGNOSIS — Z992 Dependence on renal dialysis: Secondary | ICD-10-CM

## 2015-04-05 NOTE — Progress Notes (Signed)
Patient ID: [name], female    DOB: 07/25/1931, 81 y.o.   MRN: [id number]  HPI Comments: Ms.  [name] is a 81 year old female with history of chronic systolic CHF, ESRD on HD (M,W,F), dementia, and secondary hyperparathyroidism, admitted to [facility] from 1/25-1/29 for E coli HeRO sepsis 2/2 graft infection s/p graft removal now with permanent graft placed on 1/29, chronic systolic CHF (well compensated during admission), and demand ischemia. She presents for follow-up of her systolic CHF.  In follow-up today, she reports that she is doing well. Son presents with her. She walks with a walker around the house, otherwise travels with a wheelchair. He reports weight has been stable. She is eating well. Denies any significant shortness of breath or leg edema. Son feels that the dry weight is appropriate. Previously was too low. No other complaints apart from unsteady gait. She does hemodialysis on Monday, Wednesday, Friday  EKG on today's visit shows sinus rhythm with rate 71 bpm, T wave abnormality through the anterior precordial leads, inferior leads, no change from prior EKG  Other past medical history  admitted to [facility] back in July 2015 for UTI and weakness.  Echo was checked during that admission back in July 2015 which showed and EF 20-25%, diffuse HK, AK of mid-apicalinferolateral myocardium, GRDD, PASP 44 mm Hg.   Her dialysis access was found to be blocked in 07/2014. port unblocked by [facility] on 08/05/14.   presented to [facility] 12/[facility]/14 for HD and was found to be febrile with a T max of 101.8 and tachycardic at 139 bpm. Her dialysis access was found to be infected.  temporary access was placed in the right femoral vein. She was scheduled to go to the OR for removal of her current dialysis access. Labs were significant for troponin of 0.12-->0.39-->0.41 and WBC count 5.1-->13.5. EKG showed sinus tachycardia, 123 bpm, left axis deviation, lateral TWI.   Echo  on 12/14/2014 that showed EF 30-35%, diastolic dysfunction, global HK, unable to exclude RWMA, moderate MR, normal RVSP.   She underwent successful removal of infected graft and placement of new graft during her admission. Family declined ischemia workup       Allergies  Allergen Reactions  . Sulfa Antibiotics Nausea And Vomiting and Rash  . Sulfur Nausea And Vomiting and Rash  . Aspirin     Current Outpatient Prescriptions on File Prior to Visit  Medication Sig Dispense Refill  . atorvastatin (LIPITOR) 40 MG tablet Take 40 mg by mouth daily.     . carvedilol (COREG) 3.125 MG tablet Take 3.125 mg by mouth 2 (two) times daily with a meal.     . clopidogrel (PLAVIX) 75 MG tablet Take 75 mg by mouth daily.     . diazepam (VALIUM) 2 MG tablet Take 1 mg by mouth every Monday, Wednesday, and Friday with hemodialysis.    [name]. donepezil (ARICEPT) 10 MG tablet Take 10 mg by mouth at bedtime.    . hydrALAZINE (APRESOLINE) 10 MG tablet Take 10 mg by mouth 3 (three) times daily.     . isosorbide mononitrate (IMDUR) 30 MG 24 hr tablet Take 30 mg by mouth daily.     . Memantine HCl ER (NAMENDA XR) 28 MG CP24 Take 28 mg by mouth daily.    . multivitamin (RENA-VIT) TABS tablet Take 1 tablet by mouth daily.    . sevelamer carbonate (RENVELA) 800 MG tablet Take 1,600 mg by mouth 3 (three) times  daily with meals.     . torsemide (DEMADEX) 20 MG tablet Take 20 mg by mouth daily.     No current facility-administered medications on file prior to visit.    Past Medical History  Diagnosis Date  . COPD (chronic obstructive pulmonary disease)     a. on home O2  . Pneumonia 05/2013  . Mental disorder     dementia - short term memory  . History of blood transfusion     after fistula ruptured  . Dementia   . ESRD (end stage renal disease)     a. HD MWF  . Chronic systolic CHF (congestive heart failure)     a. echo 06/14/14: EF 20-25%, diffuse HK, AK of mid-apicalinferolateral myocardium, GRDD, PASP 44 mm  Hg; b. echo 12/2014 EF 30-35%, diastolic dysfunction, global HK, unable to exclude RWMA, mod MR  . CAD (coronary artery disease)     a. presumed CAD - patient's son has declined cath to date    Past Surgical History  Procedure Laterality Date  . Dialysis fistula creation    . Ligation of arteriovenous  fistula Left 06/04/2013    Procedure: LIGATION OF ARTERIOVENOUS  FISTULA;  Surgeon: Fransisco Hertz, MD;  Location: [facility] OR;  Service: Vascular;  Laterality: Left;  . Insertion of dialysis catheter Right 06/04/2013    Procedure: INSERTION OF DIALYSIS CATHETER;  Surgeon: Fransisco Hertz, MD;  Location: [facility] OR;  Service: Vascular;  Laterality: Right;  . Cholecystectomy  1992  . Av fistula placement Right 07/07/2013    Procedure: ARTERIOVENOUS (AV) FISTULA CREATION - RIGHT BRACHIAL CEPHALIC ;  Surgeon: Fransisco Hertz, MD;  Location: [facility] OR;  Service: Vascular;  Laterality: Right;  . Removal of a dialysis catheter Right 11/09/2013    Procedure: REMOVAL OF A DIALYSIS CATHETER- Right TDC ;  Surgeon: Fransisco Hertz, MD;  Location: [facility] OR;  Service: Vascular;  Laterality: Right;  . Insertion of dialysis catheter Left 11/09/2013    Procedure: INSERTION OF DIALYSIS CATHETER- Left TDC;  Surgeon: Fransisco Hertz, MD;  Location: [facility] OR;  Service: Vascular;  Laterality: Left;  . Exchange of a dialysis catheter Left 12/17/2013    Procedure: EXCHANGE OF A DIALYSIS CATHETER;  Surgeon: Chuck Hint, MD;  Location: [facility] OR;  Service: Vascular;  Laterality: Left;  . Fistulogram Right 12/17/2013    Procedure: FISTULOGRAM- RIGHT ARM- POSSIBLE INTERVENTION;  Surgeon: Chuck Hint, MD;  Location: [facility] OR;  Service: Vascular;  Laterality: Right;  . Ligation of competing branches of arteriovenous fistula Right 12/17/2013    Procedure: LIGATION OF COMPETING BRANCHES OF ARTERIOVENOUS FISTULA;  Surgeon: Chuck Hint, MD;  Location: [facility] OR;  Service: Vascular;  Laterality: Right;  . Ligation of arteriovenous  fistula Right  01/19/2014    Procedure: LIGATION OF ARTERIOVENOUS  FISTULA- RIGHT BRACHIOCEPHALIC FISTULA;  Surgeon: Fransisco Hertz, MD;  Location: [facility] OR;  Service: Vascular;  Laterality: Right;  . Shuntogram Right 09/10/2013    Procedure: Fistulogram;  Surgeon: Fransisco Hertz, MD;  Location: [facility] CATH LAB;  Service: Cardiovascular;  Laterality: Right;    Social History  reports that she has never smoked. She has never used smokeless tobacco. She reports that she does not drink alcohol or use illicit drugs.  Family History Family history is unknown by patient.      Review of Systems  Constitutional: Negative.   Respiratory: Negative.   Cardiovascular: Negative.   Gastrointestinal: Negative.   Musculoskeletal: Positive for back pain,  arthralgias and gait problem.  Skin: Negative.   Neurological: Negative.   Hematological: Negative.   Psychiatric/Behavioral: Positive for confusion.  All other systems reviewed and are negative.   BP 140/60 mmHg  Pulse 71  Ht 5\' 1"  (1.549 m)  Wt 116 lb 8 oz (52.844 kg)  BMI 22.02 kg/m2  Physical Exam  Constitutional: She is oriented to person, place, and time.  Appears thin, presenting in a wheelchair  HENT:  Head: Normocephalic.  Nose: Nose normal.  Mouth/Throat: Oropharynx is clear and moist.  Eyes: Conjunctivae are normal. Pupils are equal, round, and reactive to light.  Neck: Normal range of motion. Neck supple. No JVD present.  Cardiovascular: Normal rate, regular rhythm, normal heart sounds and intact distal pulses.  Exam reveals no gallop and no friction rub.   No murmur heard. Pulmonary/Chest: Effort normal and breath sounds normal. No respiratory distress. She has no wheezes. She has no rales. She exhibits no tenderness.  Abdominal: Soft. Bowel sounds are normal. She exhibits no distension. There is no tenderness.  Musculoskeletal: Normal range of motion. She exhibits no edema or tenderness.  Lymphadenopathy:    She has no cervical adenopathy.   Neurological: She is alert and oriented to person, place, and time. Coordination normal.  Skin: Skin is warm and dry. No rash noted. No erythema.  Psychiatric: She has a normal mood and affect. Her behavior is normal. Judgment and thought content normal.

## 2015-04-05 NOTE — Assessment & Plan Note (Signed)
Family has declined ischemia workup in the past. She denies any anginal symptoms

## 2015-04-05 NOTE — Assessment & Plan Note (Signed)
Tolerating hemodialysis 3 days per week. Dry weight seems appropriate per the son. Previously was low based on his report

## 2015-04-05 NOTE — Assessment & Plan Note (Signed)
Son reports symptoms are stable

## 2015-04-05 NOTE — Assessment & Plan Note (Signed)
She appears relatively euvolemic on today's visit. No changes to her medications. Fluid status managed by hemodialysis

## 2015-04-05 NOTE — Assessment & Plan Note (Signed)
Stable symptoms. Appears comfortable

## 2015-04-05 NOTE — Patient Instructions (Signed)
You are doing well. No medication changes were made.  Please call us if you have new issues that need to be addressed before your next appt.  Your physician wants you to follow-up in: 6 months.  You will receive a reminder letter in the mail two months in advance. If you don't receive a letter, please call our office to schedule the follow-up appointment.   

## 2015-04-16 ENCOUNTER — Emergency Department (HOSPITAL_COMMUNITY)
Admission: EM | Admit: 2015-04-16 | Discharge: 2015-04-16 | Disposition: A | Discharge status: Home / Self Care | Payer: Medicare HMO | Attending: Emergency Medicine | Admitting: Emergency Medicine

## 2015-04-16 ENCOUNTER — Encounter (HOSPITAL_COMMUNITY): Payer: Self-pay | Admitting: *Deleted

## 2015-04-16 DIAGNOSIS — N186 End stage renal disease: Secondary | ICD-10-CM | POA: Diagnosis not present

## 2015-04-16 DIAGNOSIS — F0390 Unspecified dementia without behavioral disturbance: Secondary | ICD-10-CM | POA: Insufficient documentation

## 2015-04-16 DIAGNOSIS — Z992 Dependence on renal dialysis: Secondary | ICD-10-CM | POA: Insufficient documentation

## 2015-04-16 DIAGNOSIS — I5022 Chronic systolic (congestive) heart failure: Secondary | ICD-10-CM | POA: Diagnosis not present

## 2015-04-16 DIAGNOSIS — R109 Unspecified abdominal pain: Secondary | ICD-10-CM | POA: Diagnosis present

## 2015-04-16 DIAGNOSIS — Z8701 Personal history of pneumonia (recurrent): Secondary | ICD-10-CM | POA: Insufficient documentation

## 2015-04-16 DIAGNOSIS — I2510 Atherosclerotic heart disease of native coronary artery without angina pectoris: Secondary | ICD-10-CM | POA: Insufficient documentation

## 2015-04-16 DIAGNOSIS — Z9049 Acquired absence of other specified parts of digestive tract: Secondary | ICD-10-CM | POA: Diagnosis not present

## 2015-04-16 DIAGNOSIS — Z7902 Long term (current) use of antithrombotics/antiplatelets: Secondary | ICD-10-CM | POA: Insufficient documentation

## 2015-04-16 DIAGNOSIS — J449 Chronic obstructive pulmonary disease, unspecified: Secondary | ICD-10-CM | POA: Diagnosis not present

## 2015-04-16 DIAGNOSIS — Z9981 Dependence on supplemental oxygen: Secondary | ICD-10-CM | POA: Insufficient documentation

## 2015-04-16 DIAGNOSIS — Z79899 Other long term (current) drug therapy: Secondary | ICD-10-CM | POA: Diagnosis not present

## 2015-04-16 LAB — CBC WITH DIFFERENTIAL/PLATELET
BASOS PCT: 2 % — AB (ref 0–1)
Basophils Absolute: 0.1 10*3/uL (ref 0.0–0.1)
EOS ABS: 0.3 10*3/uL (ref 0.0–0.7)
Eosinophils Relative: 6 % — ABNORMAL HIGH (ref 0–5)
HEMATOCRIT: 36.4 % (ref 36.0–46.0)
Hemoglobin: 11 g/dL — ABNORMAL LOW (ref 12.0–15.0)
LYMPHS ABS: 1.2 10*3/uL (ref 0.7–4.0)
LYMPHS PCT: 26 % (ref 12–46)
MCH: 29.2 pg (ref 26.0–34.0)
MCHC: 30.2 g/dL (ref 30.0–36.0)
MCV: 96.6 fL (ref 78.0–100.0)
MONO ABS: 0.4 10*3/uL (ref 0.1–1.0)
Monocytes Relative: 8 % (ref 3–12)
Neutro Abs: 2.8 10*3/uL (ref 1.7–7.7)
Neutrophils Relative %: 58 % (ref 43–77)
PLATELETS: 156 10*3/uL (ref 150–400)
RBC: 3.77 MIL/uL — AB (ref 3.87–5.11)
RDW: 17.1 % — ABNORMAL HIGH (ref 11.5–15.5)
WBC: 4.7 10*3/uL (ref 4.0–10.5)

## 2015-04-16 LAB — BASIC METABOLIC PANEL
Anion gap: 17 — ABNORMAL HIGH (ref 5–15)
BUN: 51 mg/dL — AB (ref 6–20)
CHLORIDE: 96 mmol/L — AB (ref 101–111)
CO2: 28 mmol/L (ref 22–32)
Calcium: 8.8 mg/dL — ABNORMAL LOW (ref 8.9–10.3)
Creatinine, Ser: 5.12 mg/dL — ABNORMAL HIGH (ref 0.44–1.00)
GFR calc Af Amer: 8 mL/min — ABNORMAL LOW (ref >60)
GFR calc non Af Amer: 7 mL/min — ABNORMAL LOW (ref >60)
Glucose, Bld: 87 mg/dL (ref 65–99)
Potassium: 4.1 mmol/L (ref 3.5–5.1)
SODIUM: 141 mmol/L (ref 135–145)

## 2015-04-16 MED ORDER — ONDANSETRON 4 MG PO TBDP
4.0000 mg | ORAL_TABLET | Freq: Once | ORAL | Status: AC
  Administered 2015-04-16: 4 mg via ORAL
  Filled 2015-04-16: qty 1

## 2015-04-16 MED ORDER — CEPHALEXIN 500 MG PO CAPS: 500.0000 mg | ORAL_CAPSULE | Freq: Two times a day (BID) | ORAL | Status: DC

## 2015-04-16 MED ORDER — CEPHALEXIN 500 MG PO CAPS
500.0000 mg | ORAL_CAPSULE | Freq: Once | ORAL | Status: AC
  Administered 2015-04-16: 500 mg via ORAL
  Filled 2015-04-16: qty 1

## 2015-04-16 NOTE — Discharge Instructions (Signed)
Flank Pain °Flank pain refers to pain that is located on the side of the body between the upper abdomen and the back. The pain may occur over a short period of time (acute) or may be long-term or reoccurring (chronic). It may be mild or severe. Flank pain can be caused by many things. °CAUSES  °Some of the more common causes of flank pain include: °· Muscle strains.   °· Muscle spasms.   °· A disease of your spine (vertebral disk disease).   °· A lung infection (pneumonia).   °· Fluid around your lungs (pulmonary edema).   °· A kidney infection.   °· Kidney stones.   °· A very painful skin rash caused by the chickenpox virus (shingles).   °· Gallbladder disease.   °HOME CARE INSTRUCTIONS  °Home care will depend on the cause of your pain. In general, °· Rest as directed by your caregiver. °· Drink enough fluids to keep your urine clear or pale yellow. °· Only take over-the-counter or prescription medicines as directed by your caregiver. Some medicines may help relieve the pain. °· Tell your caregiver about any changes in your pain. °· Follow up with your caregiver as directed. °SEEK IMMEDIATE MEDICAL CARE IF:  °· Your pain is not controlled with medicine.   °· You have new or worsening symptoms. °· Your pain increases.   °· You have abdominal pain.   °· You have shortness of breath.   °· You have persistent nausea or vomiting.   °· You have swelling in your abdomen.   °· You feel faint or pass out.   °· You have blood in your urine. °· You have a fever or persistent symptoms for more than 2-3 days. °· You have a fever and your symptoms suddenly get worse. °MAKE SURE YOU:  °· Understand these instructions. °· Will watch your condition. °· Will get help right away if you are not doing well or get worse. °Document Released: 12/27/2005 Document Revised: 07/30/2012 Document Reviewed: 06/19/2012 °ExitCare® Patient Information ©2015 [employer]. This information is not intended to replace advice given to you by your  health care provider. Make sure you discuss any questions you have with your health care provider. ° °

## 2015-04-16 NOTE — ED Notes (Signed)
5 hour history of lumbar pain and L side pain.  Has been vomiting since this started.  Lucila MaineGrandson states she has same symptoms anytime she has UTI.  HD patient MWF.

## 2015-04-16 NOTE — ED Notes (Signed)
I/O cath attempted x 2. Only small amount of urine in tube on second attempt.

## 2015-04-21 NOTE — ED Provider Notes (Signed)
CSN: [id number]     Arrival date & time 04/16/15  1648 History   First MD Initiated Contact with Patient 04/16/15 1843     Chief Complaint  Patient presents with  . Back Pain     (Consider location/radiation/quality/duration/timing/severity/associated sxs/prior Treatment) HPI   84yF with L flank pain. Onset a few hours prior to arrival. Gradual onsel persistent since.  esrd but apparently makes urine and family reports very similar type symptoms with uti. Associated n/v which she often gets too. No fever or chills. No trauma. No distension/bloating.   Past Medical History  Diagnosis Date  . COPD (chronic obstructive pulmonary disease)     a. on home O2  . Pneumonia 05/2013  . Mental disorder     dementia - short term memory  . History of blood transfusion     after fistula ruptured  . Dementia   . ESRD (end stage renal disease)     a. HD MWF  . Chronic systolic CHF (congestive heart failure)     a. echo 06/14/14: EF 20-25%, diffuse HK, AK of mid-apicalinferolateral myocardium, GRDD, PASP 44 mm Hg; b. echo 12/2014 EF 30-35%, diastolic dysfunction, global HK, unable to exclude RWMA, mod MR  . CAD (coronary artery disease)     a. presumed CAD - patient's son has declined cath to date   Past Surgical History  Procedure Laterality Date  . Dialysis fistula creation    . Ligation of arteriovenous  fistula Left 06/04/2013    Procedure: LIGATION OF ARTERIOVENOUS  FISTULA;  Surgeon: Fransisco HertzBrian L Chen, MD;  [facility];  Service: Vascular;  Laterality: Left;  . Insertion of dialysis catheter Right 06/04/2013    Procedure: INSERTION OF DIALYSIS CATHETER;  Surgeon: Fransisco HertzBrian L Chen, MD;  [facility];  Service: Vascular;  Laterality: Right;  . Cholecystectomy  1992  . Av fistula placement Right 07/07/2013    Procedure: ARTERIOVENOUS (AV) FISTULA CREATION - RIGHT BRACHIAL CEPHALIC ;  Surgeon: Fransisco HertzBrian L Chen, MD;  [facility];  Service: Vascular;  Laterality: Right;  . Removal of a dialysis  catheter Right 11/09/2013    Procedure: REMOVAL OF A DIALYSIS CATHETER- Right TDC ;  Surgeon: Fransisco HertzBrian L Chen, MD;  [facility];  Service: Vascular;  Laterality: Right;  . Insertion of dialysis catheter Left 11/09/2013    Procedure: INSERTION OF DIALYSIS CATHETER- Left TDC;  Surgeon: Fransisco HertzBrian L Chen, MD;  [facility];  Service: Vascular;  Laterality: Left;  . Exchange of a dialysis catheter Left 12/17/2013    Procedure: EXCHANGE OF A DIALYSIS CATHETER;  Surgeon: Chuck Hinthristopher S Dickson, MD;  [facility];  Service: Vascular;  Laterality: Left;  . Fistulogram Right 12/17/2013    Procedure: FISTULOGRAM- RIGHT ARM- POSSIBLE INTERVENTION;  Surgeon: Chuck Hinthristopher S Dickson, MD;  [facility];  Service: Vascular;  Laterality: Right;  . Ligation of competing branches of arteriovenous fistula Right 12/17/2013    Procedure: LIGATION OF COMPETING BRANCHES OF ARTERIOVENOUS FISTULA;  Surgeon: Chuck Hinthristopher S Dickson, MD;  [facility];  Service: Vascular;  Laterality: Right;  . Ligation of arteriovenous  fistula Right 01/19/2014    Procedure: LIGATION OF ARTERIOVENOUS  FISTULA- RIGHT BRACHIOCEPHALIC FISTULA;  Surgeon: Fransisco HertzBrian L Chen, MD;  [facility];  Service: Vascular;  Laterality: Right;  . Shuntogram Right 09/10/2013    Procedure: Fistulogram;  Surgeon: Fransisco HertzBrian L Chen, MD;  [facility];  Service: Cardiovascular;  Laterality: Right;   Family History  Problem Relation Age of  Onset  . Family history unknown: Yes   History  Substance Use Topics  . Smoking status: Never Smoker   . Smokeless tobacco: Never Used  . Alcohol Use: No   OB History    No data available     Review of Systems  All systems reviewed and negative, other than as noted in HPI.    Allergies  Sulfa antibiotics; Sulfur; and Aspirin  Home Medications   Prior to Admission medications   Medication Sig Start Date End Date Taking? Authorizing Provider  acetaminophen (TYLENOL) 325 MG tablet Take 650 mg by mouth every  6 (six) hours as needed for mild pain.   Yes Historical Provider, MD  atorvastatin (LIPITOR) 40 MG tablet Take 40 mg by mouth daily.  12/17/14  Yes Historical Provider, MD  carvedilol (COREG) 3.125 MG tablet Take 3.125 mg by mouth 2 (two) times daily with a meal.  12/17/14  Yes Historical Provider, MD  clopidogrel (PLAVIX) 75 MG tablet Take 75 mg by mouth daily.  01/04/15  Yes Historical Provider, MD  diazepam (VALIUM) 2 MG tablet Take 1 mg by mouth as needed for anxiety or sedation.    Yes Historical Provider, MD  donepezil (ARICEPT) 10 MG tablet Take 10 mg by mouth at bedtime.   Yes Historical Provider, MD  hydrALAZINE (APRESOLINE) 10 MG tablet Take 10 mg by mouth 3 (three) times daily.  12/17/14  Yes Historical Provider, MD  isosorbide mononitrate (IMDUR) 30 MG 24 hr tablet Take 30 mg by mouth daily.  12/21/14  Yes Historical Provider, MD  Memantine HCl ER (NAMENDA XR) 28 MG CP24 Take 28 mg by mouth daily.   Yes Historical Provider, MD  multivitamin (RENA-VIT) TABS tablet Take 1 tablet by mouth daily.   Yes Historical Provider, MD  sevelamer carbonate (RENVELA) 2.4 G PACK Take 2.4 g by mouth 3 (three) times daily with meals.   Yes Historical Provider, MD  sevelamer carbonate (RENVELA) 800 MG tablet Take 1,600 mg by mouth 3 (three) times daily with meals.    Yes Historical Provider, MD  torsemide (DEMADEX) 20 MG tablet Take 20 mg by mouth daily.   Yes Historical Provider, MD  cephALEXin (KEFLEX) 500 MG capsule Take 1 capsule (500 mg total) by mouth 2 (two) times daily. 04/16/15   Raeford Razor, MD   BP 164/52 mmHg  Pulse 82  Temp(Src) 98 F (36.7 C) (Oral)  Resp 20  Ht  (1.6 m)  Wt 129 lb (58.514 kg)  BMI 22.86 kg/m2  SpO2 98% Physical Exam  Constitutional: She appears well-developed and well-nourished. No distress.  HENT:  Head: Normocephalic and atraumatic.  Eyes: Conjunctivae are normal. Right eye exhibits no discharge. Left eye exhibits no discharge.  Neck: Neck supple.   Cardiovascular: Normal rate, regular rhythm and normal heart sounds.  Exam reveals no gallop and no friction rub.   No murmur heard. Pulmonary/Chest: Effort normal and breath sounds normal. No respiratory distress.  Abdominal: Soft. She exhibits no distension. There is tenderness.  Mild l abdominal tenderness. No rebound or guarding. No cva tenderness.   Musculoskeletal: She exhibits no edema or tenderness.  Neurological: She is alert.  Skin: Skin is warm and dry.  Psychiatric: She has a normal mood and affect. Her behavior is normal. Thought content normal.  Nursing note and vitals reviewed.   ED Course  Procedures (including critical care time) Labs Review Labs Reviewed  CBC WITH DIFFERENTIAL/PLATELET - Abnormal; Notable for the following:    RBC 3.77 (*)  Hemoglobin 11.0 (*)    RDW 17.1 (*)    Eosinophils Relative 6 (*)    Basophils Relative 2 (*)    All other components within normal limits  BASIC METABOLIC PANEL - Abnormal; Notable for the following:    Chloride 96 (*)    BUN 51 (*)    Creatinine, Ser 5.12 (*)    Calcium 8.8 (*)    GFR calc non Af Amer 7 (*)    GFR calc Af Amer 8 (*)    Anion gap 17 (*)    All other components within normal limits    Imaging Review No results found.   EKG Interpretation None      MDM   Final diagnoses:  Left flank pain    84yF with flank pain. Apparently very similar symptoms with previous UTI. Cath w/o significant urine. Her exam is overall  Pretty benign. Perhaps some mild L sided tenderness. Afebrile. Nontoxic. Family requesting abx. Prescription provided but recommended deferring tx unless symptoms don't improve within next day or so.     Raeford Razor, MD 04/28/15 1416

## 2015-05-25 IMAGING — US US THORACENTESIS ASP PLEURAL SPACE W/IMG GUIDE
1 series · 4 of 4 positions shown · non-contrast
Comparison: none

CLINICAL DATA: Left pleural effusion, left lower lobe consolidation

ULTRASOUND GUIDED LEFT THORACENTESIS:
TECHNIQUE: After explanation of procedure and risks, written informed consent
for thoracentesis obtained.
Time out protocol followed.
Left pleural effusion localized by ultrasound.
Site at posterior leftchest prepped and draped in usual sterile
fashion.
Skin and chest wall anesthetized with 4.5 ml of 1% lidocaine.
Standard 8-French thoracentesis catheter placed into left pleural
space.
375 ml of serous fluid aspirated by syringe pump.
Procedure tolerated well by patient without immediate complication.

[Series 1: us thoracentesis asp pleural space w/img guide · 0.23mm/px · 4 of 4 slices shown]
[im 1/4]
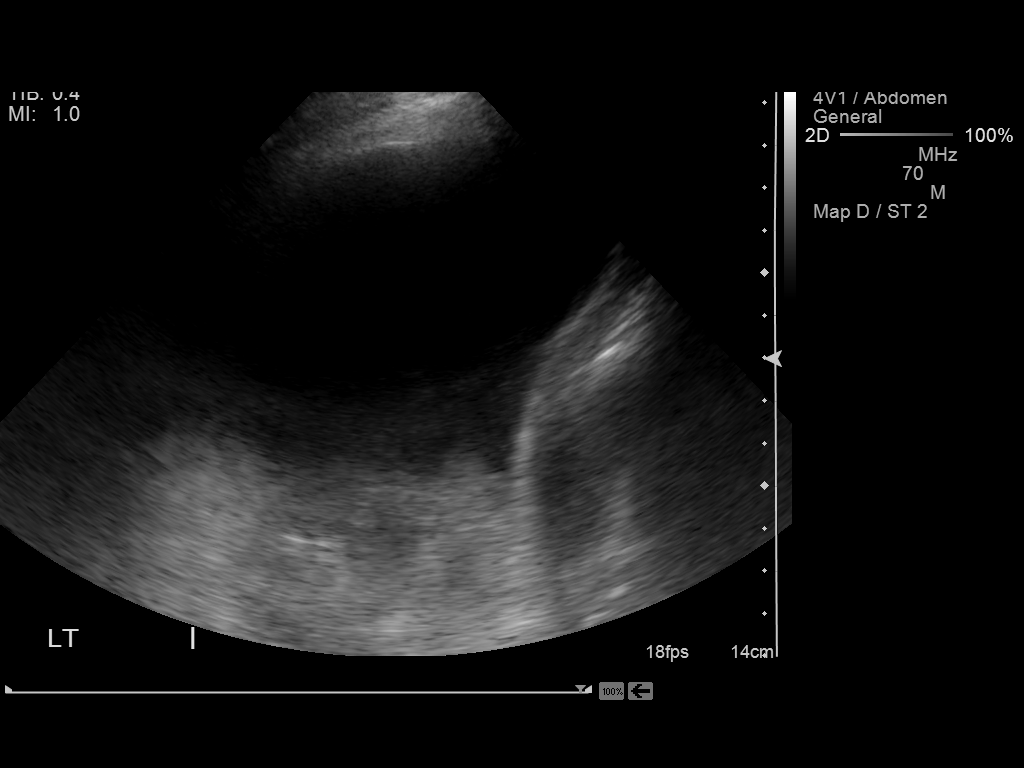
[im 2/4]
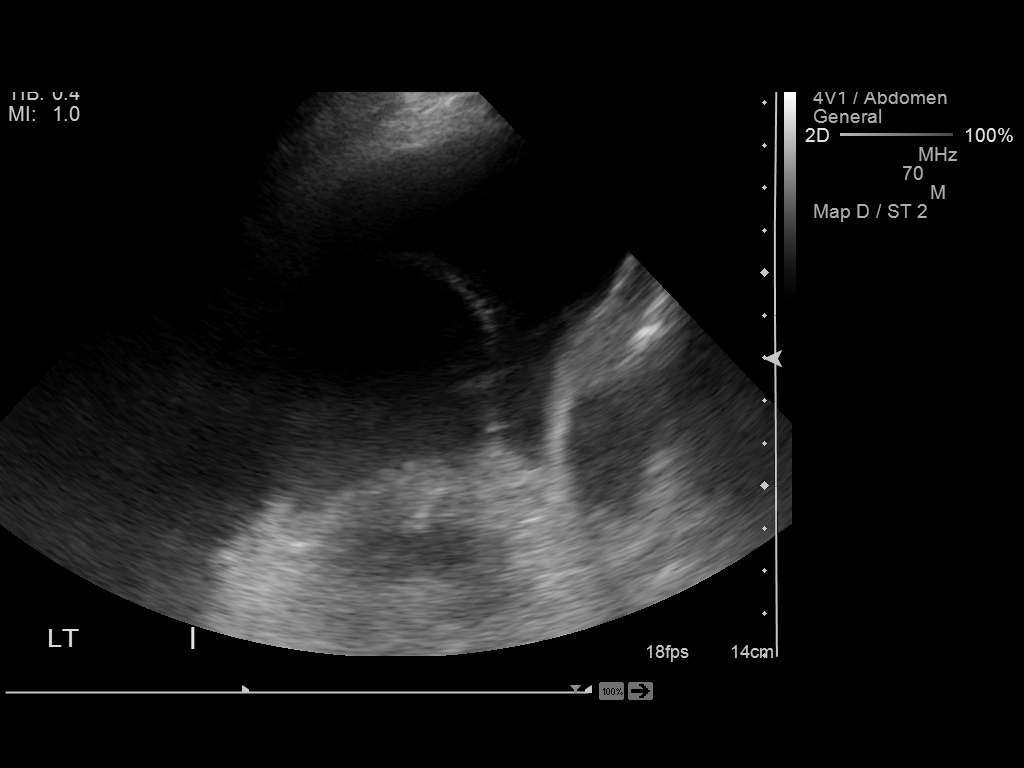
[im 3/4]
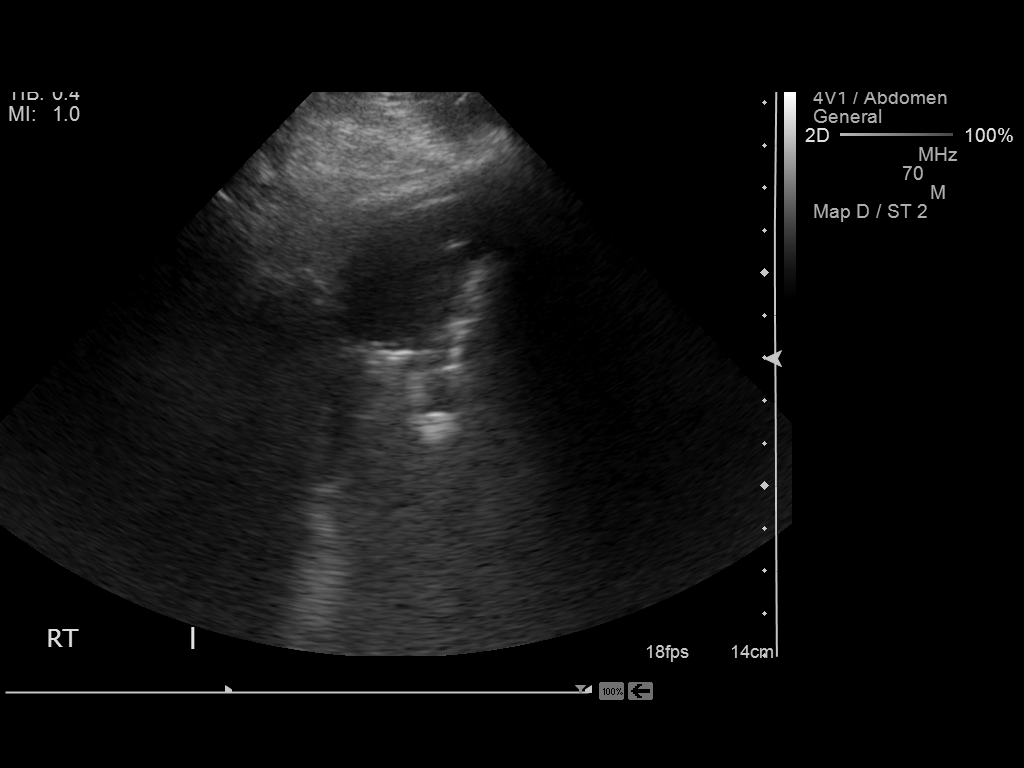
[im 4/4]
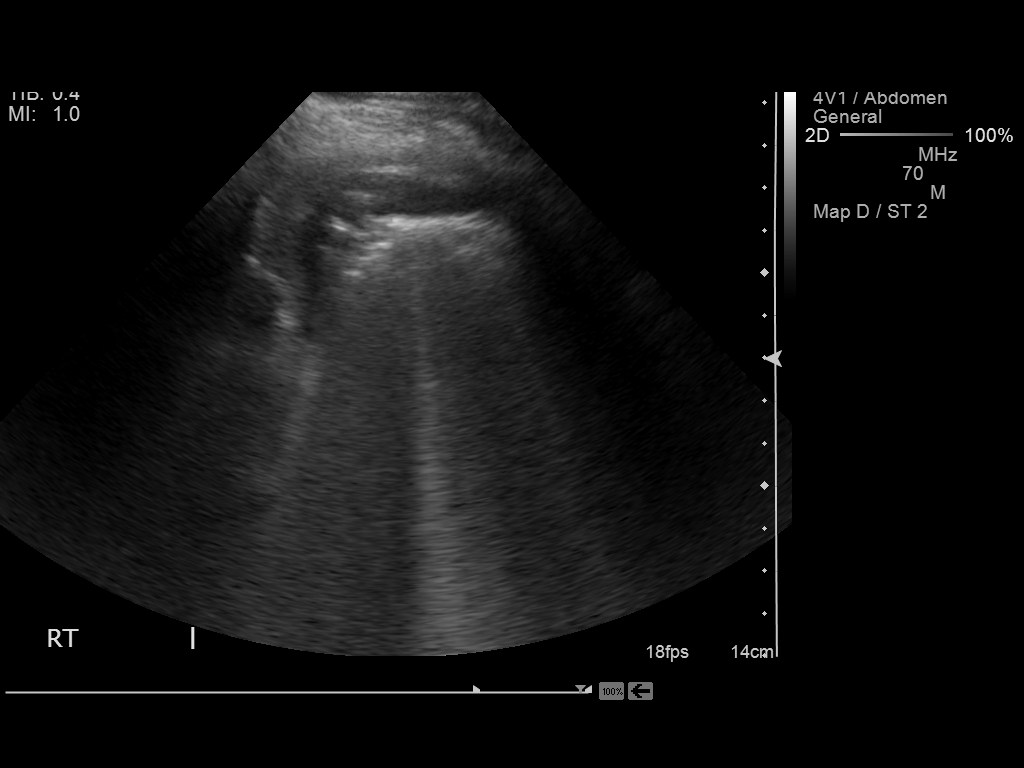

[4 of 4 positions shown; findings below may reference images not displayed]

IMPRESSION: Ultrasound-guided left thoracentesis with removal of 375 ml of
pleural fluid.
Fluid sent to laboratory for requested analysis.

## 2015-06-07 ENCOUNTER — Encounter
Admission: RE | Disposition: A | Discharge status: Home / Self Care | Payer: Self-pay | Source: Ambulatory Visit | Attending: Vascular Surgery

## 2015-06-07 ENCOUNTER — Ambulatory Visit
Admission: RE | Admit: 2015-06-07 | Discharge: 2015-06-07 | Disposition: A | Discharge status: Home / Self Care | Payer: Medicare HMO | Source: Ambulatory Visit | Attending: Vascular Surgery | Admitting: Vascular Surgery

## 2015-06-07 DIAGNOSIS — I5022 Chronic systolic (congestive) heart failure: Secondary | ICD-10-CM | POA: Diagnosis not present

## 2015-06-07 DIAGNOSIS — J449 Chronic obstructive pulmonary disease, unspecified: Secondary | ICD-10-CM | POA: Insufficient documentation

## 2015-06-07 DIAGNOSIS — N186 End stage renal disease: Secondary | ICD-10-CM | POA: Insufficient documentation

## 2015-06-07 DIAGNOSIS — F0390 Unspecified dementia without behavioral disturbance: Secondary | ICD-10-CM | POA: Diagnosis not present

## 2015-06-07 DIAGNOSIS — Z8701 Personal history of pneumonia (recurrent): Secondary | ICD-10-CM | POA: Insufficient documentation

## 2015-06-07 DIAGNOSIS — Z992 Dependence on renal dialysis: Secondary | ICD-10-CM | POA: Diagnosis not present

## 2015-06-07 DIAGNOSIS — T82898A Other specified complication of vascular prosthetic devices, implants and grafts, initial encounter: Secondary | ICD-10-CM | POA: Diagnosis not present

## 2015-06-07 DIAGNOSIS — Z79899 Other long term (current) drug therapy: Secondary | ICD-10-CM | POA: Insufficient documentation

## 2015-06-07 LAB — POTASSIUM (ARMC VASCULAR LAB ONLY): POTASSIUM (ARMC VASCULAR LAB): 4.3

## 2015-06-07 SURGERY — INFUSION THROMBOLYSIS FINAL DAY

## 2015-06-07 SURGERY — DIALYSIS/PERMA CATHETER INSERTION
Anesthesia: Moderate Sedation

## 2015-06-07 MED ORDER — HEPARIN SODIUM (PORCINE) 5000 UNIT/ML IJ SOLN
INTRAMUSCULAR | Status: AC
  Filled 2015-06-07: qty 2

## 2015-06-07 MED ORDER — HEPARIN SODIUM (PORCINE) 10000 UNIT/ML IJ SOLN
INTRAMUSCULAR | Status: AC
  Filled 2015-06-07: qty 1

## 2015-06-07 MED ORDER — ALTEPLASE 2 MG IJ SOLR: 2.0000 mg | Freq: Once | INTRAMUSCULAR | Status: DC

## 2015-06-07 MED ORDER — SODIUM CHLORIDE 0.9 % IJ SOLN
INTRAMUSCULAR | Status: AC
  Filled 2015-06-07: qty 9

## 2015-06-07 MED ORDER — SODIUM CHLORIDE 0.9 % IV SOLN
Freq: Once | INTRAVENOUS | Status: DC
  Filled 2015-06-07: qty 2

## 2015-06-07 MED ORDER — FENTANYL CITRATE (PF) 100 MCG/2ML IJ SOLN
INTRAMUSCULAR | Status: AC
  Filled 2015-06-07: qty 2

## 2015-06-07 MED ORDER — CEFAZOLIN SODIUM 1-5 GM-% IV SOLN
INTRAVENOUS | Status: AC
  Filled 2015-06-07: qty 50

## 2015-06-07 MED ORDER — SODIUM CHLORIDE 0.9 % IV SOLN
Freq: Once | INTRAVENOUS | Status: AC
  Administered 2015-06-07: 19:00:00 via INTRAVENOUS
  Filled 2015-06-07: qty 2

## 2015-06-07 MED ORDER — SODIUM CHLORIDE 0.9 % IV SOLN: INTRAVENOUS | Status: DC

## 2015-06-07 MED ORDER — LIDOCAINE-EPINEPHRINE (PF) 1 %-1:200000 IJ SOLN
INTRAMUSCULAR | Status: AC
  Filled 2015-06-07: qty 30

## 2015-06-07 MED ORDER — HEPARIN (PORCINE) IN NACL 2-0.9 UNIT/ML-% IJ SOLN
INTRAMUSCULAR | Status: AC
  Filled 2015-06-07: qty 500

## 2015-06-07 MED ORDER — MIDAZOLAM HCL 5 MG/5ML IJ SOLN
INTRAMUSCULAR | Status: AC
  Filled 2015-06-07: qty 5

## 2015-06-07 NOTE — Op Note (Signed)
OPERATIVE NOTE   PROCEDURE: 1. Infusion of TPA for clearance of tunneled dialysis catheter  PRE-OPERATIVE DIAGNOSIS: Complication of tunneled dialysis catheter with poor flow, and stage renal disease requiring hemodialysis  POST-OPERATIVE DIAGNOSIS: Same  SURGEON: Renford DillsGregory G. Guelda Batson, MD  ANESTHESIA: None  ESTIMATED BLOOD LOSS: Minimal cc  FINDING(S): 1. Attempts at aspirating both lumens are unsuccessful prior to infusion. This suggests poor flow and inadequate situation for continued dialysis.  SPECIMEN(S):  None  INDICATIONS:   [name] is a 81 y.o. female who presents with a poorly functioning tunneled dialysis catheter.  DESCRIPTION: After obtaining full informed written consent, the patient was brought to special procedures holding area and positioned supine on a gurney.   Under sterile technique were catheter function is verified. 2 mg of alteplase is reconstituted in 50 cc a total of 2 aliquots are made. Subtotally infusion through both lumens of the dialysis catheter is performed over 2-1/2 hour time period. Infusion is simultaneous.  Following completion of the infusion again using sterile technique both lumens are aspirated and suctioned with flushed with sterile saline. Aspiration demonstrates excellent flow from both lumens. Both lumens are then packed with a total of 5000 units of heparin per lumen reconstituted in the indicated volume based on the patient's catheter. Sterile dressing is reapplied..  COMPLICATIONS: None  CONDITION: Good  Renford DillsGregory G Laikynn Pollio M.D. [facility] Office: [phone]   06/07/2015, 6:30 PM

## 2015-06-07 NOTE — OR Nursing (Signed)
tpa started at 1545.

## 2015-06-07 NOTE — H&P (Signed)
[facility] Admission History & Physical  MRN : [id number]  [name] is a 81 y.o. (11/09/1931) female who presents with chief complaint of catheter not working.  History of Present Illness: Patient is sent by dialysis for catheter exchange because her catheter has not been working well. There is been no attempt at thrombolysis. The patient is having copious diarrhea to the point of excoriation of her perineal area and given her catheter is in the femoral location this represents a significant complicating factor. No fever or chills at home or while on dialysis.  Current Facility-Administered Medications  Medication Dose Route Frequency Provider Last Rate Last Dose  . 0.9 %  sodium chloride infusion   Intravenous Continuous Renford DillsGregory G Abbegail Matuska, MD      . alteplase (CATHFLO ACTIVASE) 2 mg in sodium chloride 0.9 % 50 mL injection   Intravenous Once Renford DillsGregory G Emonnie Cannady, MD      . alteplase (CATHFLO ACTIVASE) 2 mg in sodium chloride 0.9 % 50 mL injection   Intravenous Once Renford DillsGregory G Torion Hulgan, MD      . heparin 5000 UNIT/ML injection           . sodium chloride 0.9 % injection             Past Medical History  Diagnosis Date  . COPD (chronic obstructive pulmonary disease)     a. on home O2  . Pneumonia 05/2013  . Mental disorder     dementia - short term memory  . History of blood transfusion     after fistula ruptured  . Dementia   . ESRD (end stage renal disease)     a. HD MWF  . Chronic systolic CHF (congestive heart failure)     a. echo 06/14/14: EF 20-25%, diffuse HK, AK of mid-apicalinferolateral myocardium, GRDD, PASP 44 mm Hg; b. echo 12/2014 EF 30-35%, diastolic dysfunction, global HK, unable to exclude RWMA, mod MR  . CAD (coronary artery disease)     a. presumed CAD - patient's son has declined cath to date    Past Surgical History  Procedure Laterality Date  . Dialysis fistula creation    . Ligation of arteriovenous  fistula Left 06/04/2013     Procedure: LIGATION OF ARTERIOVENOUS  FISTULA;  Surgeon: Fransisco HertzBrian L Chen, MD;  [facility];  Service: Vascular;  Laterality: Left;  . Insertion of dialysis catheter Right 06/04/2013    Procedure: INSERTION OF DIALYSIS CATHETER;  Surgeon: Fransisco HertzBrian L Chen, MD;  [facility];  Service: Vascular;  Laterality: Right;  . Cholecystectomy  1992  . Av fistula placement Right 07/07/2013    Procedure: ARTERIOVENOUS (AV) FISTULA CREATION - RIGHT BRACHIAL CEPHALIC ;  Surgeon: Fransisco HertzBrian L Chen, MD;  [facility];  Service: Vascular;  Laterality: Right;  . Removal of a dialysis catheter Right 11/09/2013    Procedure: REMOVAL OF A DIALYSIS CATHETER- Right TDC ;  Surgeon: Fransisco HertzBrian L Chen, MD;  [facility];  Service: Vascular;  Laterality: Right;  . Insertion of dialysis catheter Left 11/09/2013    Procedure: INSERTION OF DIALYSIS CATHETER- Left TDC;  Surgeon: Fransisco HertzBrian L Chen, MD;  [facility];  Service: Vascular;  Laterality: Left;  . Exchange of a dialysis catheter Left 12/17/2013    Procedure: EXCHANGE OF A DIALYSIS CATHETER;  Surgeon: Chuck Hinthristopher S Dickson, MD;  [facility];  Service: Vascular;  Laterality: Left;  . Fistulogram Right 12/17/2013    Procedure: FISTULOGRAM- RIGHT ARM- POSSIBLE INTERVENTION;  Surgeon: Chuck Hint, MD;  Location: [facility] OR;  Service: Vascular;  Laterality: Right;  . Ligation of competing branches of arteriovenous fistula Right 12/17/2013    Procedure: LIGATION OF COMPETING BRANCHES OF ARTERIOVENOUS FISTULA;  Surgeon: Chuck Hint, MD;  Location[facility] OR;  Service: Vascular;  Laterality: Right;  . Ligation of arteriovenous  fistula Right 01/19/2014    Procedure: LIGATION OF ARTERIOVENOUS  FISTULA- RIGHT BRACHIOCEPHALIC FISTULA;  Surgeon: Fransisco Hertz, MD;  Location[facility] OR;  Service: Vascular;  Laterality: Right;  . Shuntogram Right 09/10/2013    Procedure: Fistulogram;  Surgeon: Fransisco Hertz, MD;  Location: [facility] CATH LAB;  Service: Cardiovascular;  Laterality: Right;     Social History History  Substance Use Topics  . Smoking status: Never Smoker   . Smokeless tobacco: Never Used  . Alcohol Use: No    Family History Family History  Problem Relation Age of Onset  . Family history unknown: Yes   no family history of porphyria or autoimmune disease  Allergies  Allergen Reactions  . Sulfa Antibiotics Nausea And Vomiting and Rash  . Sulfur Nausea And Vomiting and Rash  . Aspirin Nausea And Vomiting     REVIEW OF SYSTEMS (Negative unless checked)  Constitutional: [] Weight loss  [] Fever  [] Chills Cardiac: [] Chest pain   [] Chest pressure   [] Palpitations   [] Shortness of breath when laying flat   [] Shortness of breath at rest   [x] Shortness of breath with exertion. Vascular:  [] Pain in legs with walking   [] Pain in legs at rest   [x] Pain in legs when laying flat   [] Claudication   [] Pain in feet when walking  [] Pain in feet at rest  [] Pain in feet when laying flat   [] History of DVT   [] Phlebitis   [] Swelling in legs   [] Varicose veins   [] Non-healing ulcers Pulmonary:   [x] Uses home oxygen   [] Productive cough   [] Hemoptysis   [] Wheeze  [x] COPD   [] Asthma Neurologic:  [] Dizziness  [] Blackouts   [] Seizures   [x] History of stroke   [] History of TIA  [] Aphasia   [] Temporary blindness   [] Dysphagia   [] Weakness or numbness in arms   [] Weakness or numbness in legs Musculoskeletal:  [] Arthritis   [] Joint swelling   [x] Joint pain   [x] Low back pain Hematologic:  [] Easy bruising  [] Easy bleeding   [] Hypercoagulable state   [] Anemic  [] Hepatitis Gastrointestinal:  [] Blood in stool   [] Vomiting blood  [] Gastroesophageal reflux/heartburn   [] Difficulty swallowing. Genitourinary:  [] Chronic kidney disease   [] Difficult urination  [] Frequent urination  [] Burning with urination   [] Blood in urine Skin:  [] Rashes   [] Ulcers   [] Wounds Psychological:  [] History of anxiety   []  History of major depression.  Physical Examination  Filed Vitals:   06/07/15 1158  06/07/15 1620  BP: 144/63 176/54  Pulse: 75   Temp: 98.3 F (36.8 C)   TempSrc: Oral   Resp: 20 20  Height: 5\' 3"  (1.6 m)   Weight: 113 lb (51.256 kg)   SpO2: 96% 99%   Body mass index is 20.02 kg/(m^2). Gen: WD/WN, NAD Head: [place]/AT, No temporalis wasting. Prominent temp pulse not noted. Ear/Nose/Throat: Hearing grossly intact, nares w/o erythema or drainage, oropharynx w/o Erythema/Exudate, Mallampati class II Eyes: PERRLA, EOMI.  Neck: Supple, no nuchal rigidity.  No bruit or JVD.  Pulmonary:  Good air movement, clear to auscultation bilaterally, no use of accessory muscles.  Cardiac: RRR, normal S1, S2, no Murmurs, rubs or  gallops. Vascular: Femoral catheter clean and intact appears uninfected Gastrointestinal: soft, non-tender/non-distended. No guarding/reflex.  Musculoskeletal: M/S 5/5 throughout.  Extremities without ischemic changes.  No deformity or atrophy.  Neurologic: CN 2-12 intact. Pain and light touch intact in extremities.  Symmetrical.  Speech is fluent. Motor exam as listed above. Psychiatric: Judgment intact, Mood & affect appropriate for pt's clinical situation. Dermatologic: No rashes or ulcers noted.  No cellulitis or open wounds. Lymph : No Cervical, Axillary, or Inguinal lymphadenopathy.     CBC Lab Results  Component Value Date   WBC 4.7 04/16/2015   HGB 11.0* 04/16/2015   HCT 36.4 04/16/2015   MCV 96.6 04/16/2015   PLT 156 04/16/2015    BMET    Component Value Date/Time   NA 141 04/16/2015 1912   NA 135* 12/17/2014 0509   K 4.1 04/16/2015 1912   K 4.3 12/17/2014 0509   CL 96* 04/16/2015 1912   CL 98 12/17/2014 0509   CO2 28 04/16/2015 1912   CO2 29 12/17/2014 0509   GLUCOSE 87 04/16/2015 1912   GLUCOSE 87 12/17/2014 0509   BUN 51* 04/16/2015 1912   BUN 34* 12/17/2014 0509   CREATININE 5.12* 04/16/2015 1912   CREATININE 6.62* 12/17/2014 0509   CALCIUM 8.8* 04/16/2015 1912   CALCIUM 7.7* 12/17/2014 0509   CALCIUM 7.7* 06/10/2013 0609    GFRNONAA 7* 04/16/2015 1912   GFRNONAA 5* 06/16/2014 0406   GFRAA 8* 04/16/2015 1912   GFRAA 6* 06/16/2014 0406   CrCl cannot be calculated (Patient has no serum creatinine result on file.).  COAG Lab Results  Component Value Date   INR 1.2 12/13/2014   INR 1.11 06/04/2013   INR 1.17 04/25/2013   Assessment/Plan 1. Complication of dialysis device with poor flow through her catheter. Given her copious diarrhea and the femoral location I believe it is much more prudent to attempt to salvage this well-healed catheter then to place a new one which would be much more susceptible to infection with a new poorly incorporated cuff. Therefore she will undergo TPA infusion as this has not been tried for this catheter. 2. End-stage renal disease requiring hemodialysis. Once the catheter is been cleared or replaced she will continue her dialysis schedule. 3. Dementia patient has history of pulling out multiple catheters and placement in the femoral vein has been the most stable access that she has had therefore this will be continued if feasible.   Sharilynn Cassity, Latina Craver, MD  06/07/2015 5:10 PM

## 2015-06-21 ENCOUNTER — Ambulatory Visit
Admission: RE | Admit: 2015-06-21 | Discharge: 2015-06-21 | Disposition: A | Discharge status: Home / Self Care | Payer: Medicare HMO | Source: Ambulatory Visit | Attending: Vascular Surgery | Admitting: Vascular Surgery

## 2015-06-21 DIAGNOSIS — N186 End stage renal disease: Secondary | ICD-10-CM | POA: Insufficient documentation

## 2015-06-21 DIAGNOSIS — T82898A Other specified complication of vascular prosthetic devices, implants and grafts, initial encounter: Secondary | ICD-10-CM | POA: Diagnosis not present

## 2015-06-21 MED ORDER — SODIUM CHLORIDE 0.9 % IV SOLN
INTRAVENOUS | Status: AC
  Administered 2015-06-21: 14:00:00 via INTRAVENOUS

## 2015-06-21 MED ORDER — SODIUM CHLORIDE 0.9 % IV SOLN
INTRAVENOUS | Status: AC
  Administered 2015-06-21 (×2): via INTRAVENOUS
  Filled 2015-06-21 (×2): qty 50

## 2015-06-21 MED ORDER — SODIUM CHLORIDE 0.9 % IV SOLN
INTRAVENOUS | Status: AC
  Administered 2015-06-21: 14:00:00 via INTRAVENOUS_CENTRAL

## 2015-06-21 NOTE — OR Nursing (Signed)
Pt incontinent stool, noted to have dark bloody stool, son called and informed of bloody stool. Dr Gilda Crease to be informed when he flushes dialysis catheter.

## 2015-06-21 NOTE — OR Nursing (Signed)
Dr Gilda Crease notified that no difficulty with aspiration of blood prior to TPA infusion via both ports of perm cath right upper leg. Continue with infusion per order.

## 2015-06-28 ENCOUNTER — Encounter
Admission: RE | Disposition: A | Discharge status: Home / Self Care | Payer: Self-pay | Source: Ambulatory Visit | Attending: Vascular Surgery

## 2015-06-28 ENCOUNTER — Encounter: Payer: Self-pay | Admitting: *Deleted

## 2015-06-28 ENCOUNTER — Observation Stay
Admission: EM | Admit: 2015-06-28 | Discharge: 2015-06-30 | Disposition: A | Discharge status: Home / Self Care | Payer: Medicare HMO | Source: Home / Self Care | Admitting: Vascular Surgery

## 2015-06-28 ENCOUNTER — Ambulatory Visit
Admission: RE | Admit: 2015-06-28 | Discharge: 2015-06-28 | Disposition: A | Discharge status: Home / Self Care | Payer: Medicare HMO | Source: Ambulatory Visit | Attending: Vascular Surgery | Admitting: Vascular Surgery

## 2015-06-28 DIAGNOSIS — Z992 Dependence on renal dialysis: Secondary | ICD-10-CM | POA: Insufficient documentation

## 2015-06-28 DIAGNOSIS — I5022 Chronic systolic (congestive) heart failure: Secondary | ICD-10-CM | POA: Diagnosis present

## 2015-06-28 DIAGNOSIS — I2510 Atherosclerotic heart disease of native coronary artery without angina pectoris: Secondary | ICD-10-CM | POA: Diagnosis present

## 2015-06-28 DIAGNOSIS — F0390 Unspecified dementia without behavioral disturbance: Secondary | ICD-10-CM | POA: Insufficient documentation

## 2015-06-28 DIAGNOSIS — N186 End stage renal disease: Secondary | ICD-10-CM

## 2015-06-28 DIAGNOSIS — Z79899 Other long term (current) drug therapy: Secondary | ICD-10-CM | POA: Insufficient documentation

## 2015-06-28 DIAGNOSIS — I509 Heart failure, unspecified: Secondary | ICD-10-CM | POA: Insufficient documentation

## 2015-06-28 DIAGNOSIS — T82898A Other specified complication of vascular prosthetic devices, implants and grafts, initial encounter: Secondary | ICD-10-CM | POA: Insufficient documentation

## 2015-06-28 DIAGNOSIS — J449 Chronic obstructive pulmonary disease, unspecified: Secondary | ICD-10-CM | POA: Insufficient documentation

## 2015-06-28 DIAGNOSIS — Z7902 Long term (current) use of antithrombotics/antiplatelets: Secondary | ICD-10-CM | POA: Insufficient documentation

## 2015-06-28 DIAGNOSIS — T148XXA Other injury of unspecified body region, initial encounter: Secondary | ICD-10-CM

## 2015-06-28 HISTORY — PX: PERIPHERAL VASCULAR CATHETERIZATION: SHX172C

## 2015-06-28 LAB — POTASSIUM (ARMC VASCULAR LAB ONLY): Potassium (ARMC vascular lab): 4.5

## 2015-06-28 SURGERY — DIALYSIS/PERMA CATHETER INSERTION
Anesthesia: Moderate Sedation

## 2015-06-28 MED ORDER — LIDOCAINE-EPINEPHRINE (PF) 1 %-1:200000 IJ SOLN
INTRAMUSCULAR | Status: AC
  Filled 2015-06-28: qty 30

## 2015-06-28 MED ORDER — CEFAZOLIN SODIUM 1-5 GM-% IV SOLN
1.0000 g | Freq: Once | INTRAVENOUS | Status: AC
  Administered 2015-06-28: 1 g via INTRAVENOUS

## 2015-06-28 MED ORDER — HEPARIN (PORCINE) IN NACL 2-0.9 UNIT/ML-% IJ SOLN
INTRAMUSCULAR | Status: AC
  Filled 2015-06-28: qty 500

## 2015-06-28 MED ORDER — MIDAZOLAM HCL 2 MG/2ML IJ SOLN
INTRAMUSCULAR | Status: DC | PRN
  Administered 2015-06-28: 0.5 mg via INTRAVENOUS

## 2015-06-28 MED ORDER — MIDAZOLAM HCL 2 MG/2ML IJ SOLN
INTRAMUSCULAR | Status: AC
  Filled 2015-06-28: qty 2

## 2015-06-28 MED ORDER — HEPARIN SODIUM (PORCINE) 10000 UNIT/ML IJ SOLN
INTRAMUSCULAR | Status: AC
  Filled 2015-06-28: qty 1

## 2015-06-28 MED ORDER — CEFAZOLIN SODIUM 1-5 GM-% IV SOLN
INTRAVENOUS | Status: AC
  Filled 2015-06-28: qty 50

## 2015-06-28 MED ORDER — FENTANYL CITRATE (PF) 100 MCG/2ML IJ SOLN
INTRAMUSCULAR | Status: AC
  Filled 2015-06-28: qty 2

## 2015-06-28 MED ORDER — ONDANSETRON HCL 4 MG/2ML IJ SOLN
INTRAMUSCULAR | Status: AC
Start: 2015-06-28 — End: 2015-06-28
  Administered 2015-06-28: 4 mg
  Filled 2015-06-28: qty 2

## 2015-06-28 MED ORDER — SODIUM CHLORIDE 0.9 % IJ SOLN
INTRAMUSCULAR | Status: AC
  Filled 2015-06-28: qty 6

## 2015-06-28 MED ORDER — SODIUM CHLORIDE 0.9 % IV SOLN
INTRAVENOUS | Status: DC
  Administered 2015-06-28 (×2): via INTRAVENOUS

## 2015-06-28 SURGICAL SUPPLY — 5 items
CATH PALINDROME 44CM (CATHETERS) ×3 IMPLANT
GUIDEWIRE SUPER STIFF .035X180 (WIRE) ×3 IMPLANT
PACK ANGIOGRAPHY (CUSTOM PROCEDURE TRAY) ×3 IMPLANT
SUT SILK 0 FSL (SUTURE) ×3 IMPLANT
TOWEL OR 17X26 4PK STRL BLUE (TOWEL DISPOSABLE) ×3 IMPLANT

## 2015-06-28 NOTE — ED Notes (Addendum)
Pt arrived to ED via private auto with her family. Pt recently discharged post dialysis access placement performed by vascular at this hospital. Upon arriving at home pt family noticed continued bleeding from her surgical site. Vascular called by pt family and pt instructed to come to ED for further evaluation. Pt is not to been seen by ED physician but is to be registered as an ED pt as directed by vascular MD. Pt currently alert and appears calm while sitting in personal wheelchair. No increased work of breathing or acute distress noted at this time. Wound covered by an adult depends by her family while at home and does not appear to to bleeding around the dressing. Dressing not disturbed at this time.

## 2015-06-28 NOTE — H&P (Signed)
[facility] History & Physical Update  The patient was interviewed and re-examined.  The patient's previous History and Physical has been reviewed and is unchanged.  There is no change in the plan of care.  Kayron Hicklin, Latina Craver, MD  06/28/2015, 5:28 PM

## 2015-06-28 NOTE — Op Note (Signed)
OPERATIVE NOTE   PROCEDURE: 1. Insertion of tunneled dialysis catheter right femoral approach same venous access.  PRE-OPERATIVE DIAGNOSIS: Complication of dialysis catheter with poor flow; end-stage renal disease requiring hemodialysis  POST-OPERATIVE DIAGNOSIS: Same  SURGEON: Deno Sida, Latina Craver  ANESTHESIA: Conscious sedation with 1% lidocaine local infiltration  ESTIMATED BLOOD LOSS: Minimal cc  CONTRAST USED:  None  FLUOROSCOPY TIME:    INDICATIONS:   [name] is a 81 y.o.y.o. female who presents with poor flow and nonfunction of the tunneled dialysis catheter.  Adequate dialysis has not been possible.  DESCRIPTION: After obtaining full informed written consent, the patient was positioned supine. The right groin was prepped and draped in a sterile fashion. The cuff is localized and using blunt and sharp dissection it is freed from the surrounding adhesions.  The existing catheter is then transected proximal to the cuff.  The guidewire is advanced without difficulty under fluoroscopy.  Dilators are passed over the wire as needed and the tunneled dialysis catheter is fed into the central venous system without difficulty.  Under fluoroscopy the catheter tip positioned at the atrial caval junction.  Both lumens aspirate and flush easily. After verification of smooth contour with proper tip position under fluoroscopy the catheter is packed with 5000 units of heparin per lumen.  Catheter secured to the skin of the thigh with 0 silk. A sterile dressing is applied with a Biopatch.  COMPLICATIONS: None  CONDITION: Unchanged  Alois Colgan, Latina Craver  renovascular. Office:  [phone]   06/28/2015,5:30 PM

## 2015-06-28 NOTE — Discharge Instructions (Signed)
Tunneled Catheter Insertion, Care After °Refer to this sheet in the next few weeks. These instructions provide you with information on caring for yourself after your procedure. Your caregiver may also give you more specific instructions. Your treatment has been planned according to current medical practices, but problems sometimes occur. Call your caregiver if you have any problems or questions after your procedure.  °HOME CARE INSTRUCTIONS °· Rest at home the day of the procedure. You will likely be able to return to normal activities the following day. °· Follow your caregiver's specific instructions for the type of device that you have. °· Only take over-the-counter or prescription medicines as directed by your caregiver. °· Keep the insertion site of the catheter clean and dry at all times. °¨ Change the bandages (dressings) over the catheter site as directed by your caregiver. °¨ Wash the area around the catheter site during each dressing change. Sponge bathe the area using a germ-killing (antiseptic) solution as directed by your caregiver. °¨ Look for redness or swelling at the insertion site during each dressing change. °· Apply an antibiotic ointment as directed by your caregiver. °· Flush your catheter as directed to keep it from becoming clogged. °· Always wash your hands thoroughly before changing dressings or flushing the catheter. °· Do not let air enter the catheter. °¨ Never open the cap at the catheter tip. °¨ Always make sure there is no air in the syringe or in the tubing for infusions.    °· Do not lift anything heavy. °· Do not drive until your caregiver approves. °· Do not shower or bathe until your caregiver approves. When you shower or bathe, place a piece of plastic wrap over the catheter site. Do not allow the catheter site or the dressing to get wet. If taking a bath, do not allow the catheter to get submerged in the water. °If the catheter was inserted through an arm vein:  °· Avoid  wearing tight clothes or jewelry on the arm that has the catheter.   °· Do not sleep with your head on the arm that has the catheter.   °· Do not allow use of a blood pressure cuff on the arm that has the catheter.   °· Do not let anyone draw blood from the arm that has the catheter, except through the catheter itself. °SEEK MEDICAL CARE IF: °· You have bleeding at the insertion site of the catheter.   °· You feel weak or nauseous.   °· Your catheter is not working properly.   °· You have redness, pain, swelling, and warmth at the insertion site.   °· You notice fluid draining from the insertion site.   °SEEK IMMEDIATE MEDICAL CARE IF: °· Your catheter breaks or has a hole in it.   °· Your catheter comes loose or gets pulled completely out. If this happens, hold firm pressure over the area with your hand or a clean cloth.   °· You have a fever. °· You have chills.   °· Your catheter becomes totally blocked.   °· You have swelling in your arm, shoulder, neck, or face.   °· You have bleeding from the insertion site that does not stop.   °· You develop chest pain or have trouble breathing.   °· You feel dizzy or faint.   °MAKE SURE YOU: °· Understand these instructions. °· Will watch your condition. °· Will get help right away if you are not doing well or get worse. °Document Released: 10/22/2012 Document Revised: 07/08/2013 Document Reviewed: 10/22/2012 °[employer]® Patient Information ©2015 [employer]. This information is not intended to replace advice   given to you by your health care provider. Make sure you discuss any questions you have with your health care provider. ° °

## 2015-06-29 DIAGNOSIS — T148XXA Other injury of unspecified body region, initial encounter: Secondary | ICD-10-CM

## 2015-06-29 LAB — BASIC METABOLIC PANEL
Anion gap: 16 — ABNORMAL HIGH (ref 5–15)
BUN: 98 mg/dL — ABNORMAL HIGH (ref 6–20)
CHLORIDE: 99 mmol/L — AB (ref 101–111)
CO2: 24 mmol/L (ref 22–32)
Calcium: 8.1 mg/dL — ABNORMAL LOW (ref 8.9–10.3)
Creatinine, Ser: 7.36 mg/dL — ABNORMAL HIGH (ref 0.44–1.00)
GFR calc non Af Amer: 5 mL/min — ABNORMAL LOW (ref >60)
GFR, EST AFRICAN AMERICAN: 5 mL/min — AB (ref >60)
Glucose, Bld: 92 mg/dL (ref 65–99)
Potassium: 4.9 mmol/L (ref 3.5–5.1)
Sodium: 139 mmol/L (ref 135–145)

## 2015-06-29 LAB — CBC
HEMATOCRIT: 32.4 % — AB (ref 35.0–47.0)
HEMOGLOBIN: 10.4 g/dL — AB (ref 12.0–16.0)
MCH: 29 pg (ref 26.0–34.0)
MCHC: 32 g/dL (ref 32.0–36.0)
MCV: 90.6 fL (ref 80.0–100.0)
Platelets: 127 10*3/uL — ABNORMAL LOW (ref 150–440)
RBC: 3.57 MIL/uL — ABNORMAL LOW (ref 3.80–5.20)
RDW: 19 % — ABNORMAL HIGH (ref 11.5–14.5)
WBC: 8.1 10*3/uL (ref 3.6–11.0)

## 2015-06-29 LAB — GLUCOSE, CAPILLARY
Glucose-Capillary: 86 mg/dL (ref 65–99)
Glucose-Capillary: 121 mg/dL — ABNORMAL HIGH (ref 65–99)
Glucose-Capillary: 123 mg/dL — ABNORMAL HIGH (ref 65–99)

## 2015-06-29 MED ORDER — NEPHRO-VITE 0.8 MG PO TABS
1.0000 | ORAL_TABLET | Freq: Every day | ORAL | Status: DC
  Administered 2015-06-29 – 2015-06-30 (×2): 1 via ORAL
  Filled 2015-06-29 (×4): qty 1

## 2015-06-29 MED ORDER — CEFAZOLIN SODIUM 1-5 GM-% IV SOLN
1.0000 g | Freq: Three times a day (TID) | INTRAVENOUS | Status: AC
  Administered 2015-06-29: 1 g via INTRAVENOUS
  Filled 2015-06-29 (×2): qty 50

## 2015-06-29 MED ORDER — MEMANTINE HCL ER 7 MG PO CP24
28.0000 mg | ORAL_CAPSULE | Freq: Every day | ORAL | Status: DC
  Administered 2015-06-30: 28 mg via ORAL
  Filled 2015-06-29 (×2): qty 4

## 2015-06-29 MED ORDER — ACETAMINOPHEN 325 MG PO TABS: 325.0000 mg | ORAL_TABLET | ORAL | Status: DC | PRN

## 2015-06-29 MED ORDER — PANTOPRAZOLE SODIUM 40 MG PO TBEC
40.0000 mg | DELAYED_RELEASE_TABLET | Freq: Every day | ORAL | Status: DC
  Administered 2015-06-29 – 2015-06-30 (×2): 40 mg via ORAL
  Filled 2015-06-29 (×2): qty 1

## 2015-06-29 MED ORDER — ATORVASTATIN CALCIUM 20 MG PO TABS
40.0000 mg | ORAL_TABLET | Freq: Every day | ORAL | Status: DC
  Administered 2015-06-29 – 2015-06-30 (×2): 40 mg via ORAL
  Filled 2015-06-29 (×2): qty 2

## 2015-06-29 MED ORDER — ALUM & MAG HYDROXIDE-SIMETH 200-200-20 MG/5ML PO SUSP: 15.0000 mL | ORAL | Status: DC | PRN

## 2015-06-29 MED ORDER — POLYETHYLENE GLYCOL 3350 17 G PO PACK: 17.0000 g | PACK | Freq: Every day | ORAL | Status: DC | PRN

## 2015-06-29 MED ORDER — CEFAZOLIN SODIUM 1-5 GM-% IV SOLN
INTRAVENOUS | Status: AC
  Filled 2015-06-29: qty 50

## 2015-06-29 MED ORDER — EPOETIN ALFA 10000 UNIT/ML IJ SOLN
10000.0000 [IU] | INTRAMUSCULAR | Status: DC
  Administered 2015-06-29: 10000 [IU] via INTRAVENOUS

## 2015-06-29 MED ORDER — TORSEMIDE 20 MG PO TABS: 20.0000 mg | ORAL_TABLET | Freq: Every day | ORAL | Status: DC

## 2015-06-29 MED ORDER — HALOPERIDOL LACTATE 5 MG/ML IJ SOLN
5.0000 mg | Freq: Once | INTRAMUSCULAR | Status: AC
  Administered 2015-06-29: 5 mg via INTRAVENOUS
  Filled 2015-06-29: qty 1

## 2015-06-29 MED ORDER — ACETAMINOPHEN 650 MG RE SUPP: 325.0000 mg | RECTAL | Status: DC | PRN

## 2015-06-29 MED ORDER — ONDANSETRON HCL 4 MG/2ML IJ SOLN
4.0000 mg | Freq: Four times a day (QID) | INTRAMUSCULAR | Status: DC | PRN
Start: 2015-06-29 — End: 2015-06-30

## 2015-06-29 MED ORDER — DONEPEZIL HCL 5 MG PO TABS
10.0000 mg | ORAL_TABLET | Freq: Every day | ORAL | Status: DC
  Administered 2015-06-29: 10 mg via ORAL
  Filled 2015-06-29: qty 2

## 2015-06-29 MED ORDER — SODIUM CHLORIDE 0.9 % IV SOLN: 500.0000 mL | Freq: Once | INTRAVENOUS | Status: DC | PRN

## 2015-06-29 MED ORDER — RISPERIDONE 0.5 MG PO TBDP
0.5000 mg | ORAL_TABLET | Freq: Two times a day (BID) | ORAL | Status: DC | PRN
  Administered 2015-06-29: 0.5 mg via ORAL
  Filled 2015-06-29 (×3): qty 1

## 2015-06-29 MED ORDER — CARVEDILOL 3.125 MG PO TABS
3.1250 mg | ORAL_TABLET | Freq: Two times a day (BID) | ORAL | Status: DC
  Administered 2015-06-29 – 2015-06-30 (×2): 3.125 mg via ORAL
  Filled 2015-06-29 (×5): qty 1

## 2015-06-29 MED ORDER — GUAIFENESIN-DM 100-10 MG/5ML PO SYRP: 15.0000 mL | ORAL_SOLUTION | ORAL | Status: DC | PRN

## 2015-06-29 MED ORDER — DOCUSATE SODIUM 100 MG PO CAPS
100.0000 mg | ORAL_CAPSULE | Freq: Every day | ORAL | Status: DC
  Administered 2015-06-30: 100 mg via ORAL
  Filled 2015-06-29: qty 1

## 2015-06-29 MED ORDER — BISACODYL 10 MG RE SUPP: 10.0000 mg | Freq: Every day | RECTAL | Status: DC | PRN

## 2015-06-29 MED ORDER — OXYCODONE HCL 5 MG PO TABS: 5.0000 mg | ORAL_TABLET | ORAL | Status: DC | PRN

## 2015-06-29 MED ORDER — ISOSORBIDE MONONITRATE ER 30 MG PO TB24
30.0000 mg | ORAL_TABLET | Freq: Every day | ORAL | Status: DC
  Administered 2015-06-29 – 2015-06-30 (×2): 30 mg via ORAL
  Filled 2015-06-29 (×2): qty 1

## 2015-06-29 MED ORDER — MAGNESIUM SULFATE 2 GM/50ML IV SOLN: 2.0000 g | Freq: Every day | INTRAVENOUS | Status: DC | PRN

## 2015-06-29 MED ORDER — POTASSIUM CHLORIDE CRYS ER 20 MEQ PO TBCR: 20.0000 meq | EXTENDED_RELEASE_TABLET | Freq: Every day | ORAL | Status: DC | PRN

## 2015-06-29 NOTE — Progress Notes (Signed)
Subjective:   Patient known to our practice from previous admission. She is admitted for bleeding from exit site of her right femoral tunneled dialysis catheter. She has a history of dementia and as per notes, she is not remaining still for dialysis therefore there is some difficulty in using her regular access. Today, she is oriented to self. She does not know the place or the time. She is able to answer some questions. She denies any pain.  Objective:  Vital signs in last 24 hours:  Temp:  [97.6 F (36.4 C)-98.3 F (36.8 C)] 98.2 F (36.8 C) (08/10 0801) Pulse Rate:  [63-89] 74 (08/10 0801) Resp:  [15-20] 18 (08/10 0801) BP: (132-184)/(55-133) 133/65 mmHg (08/10 0801) SpO2:  [95 %-100 %] 98 % (08/10 0801) Weight:  [48.852 kg (107 lb 11.2 oz)-54.432 kg (120 lb)] 48.852 kg (107 lb 11.2 oz) (08/10 0327)  Weight change:  Filed Weights   06/28/15 2236 06/29/15 0327  Weight: 54.432 kg (120 lb) 48.852 kg (107 lb 11.2 oz)    Intake/Output:       Physical Exam: General:  no acute distress, laying in the bed, poor hygiene, uremic odor   HEENT  anicteric, moist mucous membranes   Neck  supple, no masses   Pulm/lungs  normal breathing effort, scattered rhonchi, no crackles   CVS/Heart  no rub or gallop, regular rhythm   Abdomen:   soft, nontender, nondistended   Extremities:  no peripheral edema   Neurologic:  alert, able to communicate, oriented only to self, follows simple commands   Skin:  scratch marks over her lower extremities bilaterally   Access:  right femoral PermCath. Bleeding from the exit site noted        Basic Metabolic Panel:  Recent Labs Lab 06/29/15 0345  NA 139  K 4.9  CL 99*  CO2 24  GLUCOSE 92  BUN 98*  CREATININE 7.36*  CALCIUM 8.1*     CBC:  Recent Labs Lab 06/29/15 0345  WBC 8.1  HGB 10.4*  HCT 32.4*  MCV 90.6  PLT 127*      Microbiology: Results for orders placed or performed in visit on 12/13/14  Culture, blood (single)      Status: None   Collection Time: 12/13/14  1:39 PM  Result Value Ref Range Status   Micro Text Report   Final       ORGANISM 1                Escherichia coli   COMMENT                   IN AEROBIC BOTTLE ONLY   GRAM STAIN                GRAM NEGATIVE ROD   ANTIBIOTIC                    ORG#1     AMPICILLIN                    S         CEFAZOLIN                     S         CEFOXITIN                     S         CEFTAZIDIME  S         CEFTRIAXONE                   S         CIPROFLOXACIN                 S         GENTAMICIN                    S         IMIPENEM                      S         LEVOFLOXACIN                  S           Culture, blood (single)     Status: None   Collection Time: 12/13/14  1:40 PM  Result Value Ref Range Status   Micro Text Report   Final       SOURCE: END    ORGANISM 1                Escherichia coli   ORGANISM 2                -   COMMENT                   IN AEROBIC AND ANAEROBIC BOTTLES   COMMENT                   -   GRAM STAIN                GRAM NEGATIVE ROD   GRAM STAIN                GRAM NEGATIVE ROD   ANTIBIOTIC                    ORG#1     AMPICILLIN                    S         CEFAZOLIN                     S         CEFOXITIN                     S         CEFTAZIDIME                   S         CEFTRIAXONE                   S         CIPROFLOXACIN                 S         GENTAMICIN                    S         IMIPENEM                      S         LEVOFLOXACIN  S           Clostridium Difficile [facility])     Status: None   Collection Time: 12/13/14  8:29 PM  Result Value Ref Range Status   Micro Text Report   Final       C.DIFFICILE ANTIGEN       C.DIFFICILE GDH ANTIGEN : POSITIVE   C.DIFFICILE TOXIN A/B     C.DIFFICILE TOXINS A AND B : NEGATIVE   PCR FOR TOXIGENIC C.DIFF  PCR FOR TOXIGENIC C.DIFFICILE : NEGATIVE   INTERPRETATION            Negative for toxigenic  C. difficile. Toxin  gene and active toxin production not detected. May be a nontoxigenic strain of C. difficile bacteria present, lacking the ability to produce toxin.    ANTIBIOTIC                                                      Culture, blood (single)     Status: None   Collection Time: 12/15/14  5:40 PM  Result Value Ref Range Status   Micro Text Report   Final       COMMENT                   NO GROWTH AEROBICALLY/ANAEROBICALLY IN 5 DAYS   ANTIBIOTIC                                                      Culture, blood (single)     Status: None   Collection Time: 12/15/14  5:50 PM  Result Value Ref Range Status   Micro Text Report   Final       COMMENT                   NO GROWTH AEROBICALLY/ANAEROBICALLY IN 5 DAYS   ANTIBIOTIC                                                        Coagulation Studies: No results for input(s): LABPROT, INR in the last 72 hours.  Urinalysis: No results for input(s): COLORURINE, LABSPEC, PHURINE, GLUCOSEU, HGBUR, BILIRUBINUR, KETONESUR, PROTEINUR, UROBILINOGEN, NITRITE, LEUKOCYTESUR in the last 72 hours.  Invalid input(s): APPERANCEUR    Imaging: No results found.   Medications:     . atorvastatin  40 mg Oral Daily  . b complex-vitamin c-folic acid  1 tablet Oral Daily  . carvedilol  3.125 mg Oral BID WC  .  ceFAZolin (ANCEF) IV  1 g Intravenous 3 times per day  . [START ON 06/30/2015] docusate sodium  100 mg Oral Daily  . donepezil  10 mg Oral QHS  . isosorbide mononitrate  30 mg Oral Daily  . memantine  28 mg Oral Daily  . pantoprazole  40 mg Oral Daily   sodium chloride, acetaminophen **OR** acetaminophen, alum & mag hydroxide-simeth, bisacodyl, guaiFENesin-dextromethorphan, magnesium sulfate 1 - 4 g bolus IVPB, ondansetron, oxyCODONE, polyethylene glycol, potassium chloride, risperiDONE  Assessment/ Plan:  81 y.o. caucasian female with ESRD who dialyzes at [facility], dialyzes Monday, Wednesday, Friday, followed by Dr. Pat Kocher,  other medical problems including COPD, dementia, chronic systolic congestive heart failure with EF 30-35%, coronary disease  1. ESRD. [facility], MWF, We will set up for dialysis today. Apparently, patient has had some issues with staying still at her outpatient dialysis unit. We will need some kind of chemical restraint to get her through treatment safely. I have discussed this issue with Dr. Elpidio Anis 2. AOCKD - Continue Procrit IV with dialysis treatments 3. Secondary hyperparathyroidism - Currently not on any binder. We will continue to monitor her phosphorus 4. Access complications. She has bleeding from PermCath exit site. Vascular surgery team is following.   LOS:  Gavino Fouch 8/10/201611:00 AM

## 2015-06-29 NOTE — ED Notes (Signed)
Pt to room 9 from cdu.  Pt waiting on admission.  Pt alert.  Iv started.  Family at bedside.  Pt denies any pain.

## 2015-06-29 NOTE — Progress Notes (Signed)
PRE HD   

## 2015-06-29 NOTE — Progress Notes (Signed)
Received report from Amy in Ed. Pt transferred to Room 213. Family at bedside to assist with admission questions. Pt with history of dementia but able follow commands.   Moderate amount of bleeding noted from right femoral cath site. Transparent dressing in place. Applied gauze at site and secured with paper tape. Hematoma noted at right femoral cath site.   VSS. IV intact and saline locked. Pt with no s/s distress noted at this time. Family states that they will take all valuables home today.   Will continue to monitor.

## 2015-06-29 NOTE — H&P (Signed)
[facility] Admission History & Physical  MRN : [id number]  [name] is a 81 y.o. ([birth year]) female who presents with chief complaint of  Chief Complaint  Patient presents with  . Post-op Problem  .  History of Present Illness: The patient was brought back to the emergency room by her family secondary to bleeding around the catheter. She has a tunneled catheter for hemodialysis in her right groin. It was exchanged via the same access earlier today. She is documented as having significant blood on the bandage at the time of arrival. No fever chills reported no hemodynamic instability.  Current Facility-Administered Medications  Medication Dose Route Frequency Provider Last Rate Last Dose  . ceFAZolin (ANCEF) IVPB 1 g/50 mL premix  1 g Intravenous 3 times per day Renford Dills, MD       Current Outpatient Prescriptions  Medication Sig Dispense Refill  . acetaminophen (TYLENOL) 325 MG tablet Take 650 mg by mouth every 6 (six) hours as needed for mild pain.    [name] atorvastatin (LIPITOR) 40 MG tablet Take 40 mg by mouth daily.     . carvedilol (COREG) 3.125 MG tablet Take 3.125 mg by mouth 2 (two) times daily with a meal.     . cephALEXin (KEFLEX) 500 MG capsule Take 1 capsule (500 mg total) by mouth 2 (two) times daily. 12 capsule 0  . clopidogrel (PLAVIX) 75 MG tablet Take 75 mg by mouth daily.     . diazepam (VALIUM) 2 MG tablet Take 1 mg by mouth as needed for anxiety or sedation.     [name] donepezil (ARICEPT) 10 MG tablet Take 10 mg by mouth at bedtime.    . hydrALAZINE (APRESOLINE) 10 MG tablet Take 10 mg by mouth 3 (three) times daily.     . isosorbide mononitrate (IMDUR) 30 MG 24 hr tablet Take 30 mg by mouth daily.     . Memantine HCl ER (NAMENDA XR) 28 MG CP24 Take 28 mg by mouth daily.    . multivitamin (RENA-VIT) TABS tablet Take 1 tablet by mouth daily.    . sevelamer carbonate (RENVELA) 2.4 G PACK Take 2.4 g by mouth 3 (three) times daily with  meals.    . sevelamer carbonate (RENVELA) 800 MG tablet Take 1,600 mg by mouth 3 (three) times daily with meals.     . torsemide (DEMADEX) 20 MG tablet Take 20 mg by mouth daily.      Past Medical History  Diagnosis Date  . COPD (chronic obstructive pulmonary disease)     a. on home O2  . Pneumonia 05/2013  . Mental disorder     dementia - short term memory  . History of blood transfusion     after fistula ruptured  . Dementia   . ESRD (end stage renal disease)     a. HD MWF  . Chronic systolic CHF (congestive heart failure)     a. echo 06/14/14: EF 20-25%, diffuse HK, AK of mid-apicalinferolateral myocardium, GRDD, PASP 44 mm Hg; b. echo 12/2014 EF 30-35%, diastolic dysfunction, global HK, unable to exclude RWMA, mod MR  . CAD (coronary artery disease)     a. presumed CAD - patient's son has declined cath to date    Past Surgical History  Procedure Laterality Date  . Dialysis fistula creation    . Ligation of arteriovenous  fistula Left 06/04/2013    Procedure: LIGATION OF ARTERIOVENOUS  FISTULA;  Surgeon: Fransisco Hertz, MD;  Location:  [facility] OR;  Service: Vascular;  Laterality: Left;  . Insertion of dialysis catheter Right 06/04/2013    Procedure: INSERTION OF DIALYSIS CATHETER;  Surgeon: Fransisco Hertz, MD;  Location: [facility] OR;  Service: Vascular;  Laterality: Right;  . Cholecystectomy  1992  . Av fistula placement Right 07/07/2013    Procedure: ARTERIOVENOUS (AV) FISTULA CREATION - RIGHT BRACHIAL CEPHALIC ;  Surgeon: Fransisco Hertz, MD;  Location: [facility] OR;  Service: Vascular;  Laterality: Right;  . Removal of a dialysis catheter Right 11/09/2013    Procedure: REMOVAL OF A DIALYSIS CATHETER- Right TDC ;  Surgeon: Fransisco Hertz, MD;  Location: [facility] OR;  Service: Vascular;  Laterality: Right;  . Insertion of dialysis catheter Left 11/09/2013    Procedure: INSERTION OF DIALYSIS CATHETER- Left TDC;  Surgeon: Fransisco Hertz, MD;  Location: [facility] OR;  Service: Vascular;  Laterality: Left;  . Exchange of a  dialysis catheter Left 12/17/2013    Procedure: EXCHANGE OF A DIALYSIS CATHETER;  Surgeon: Chuck Hint, MD;  Location: [facility] OR;  Service: Vascular;  Laterality: Left;  . Fistulogram Right 12/17/2013    Procedure: FISTULOGRAM- RIGHT ARM- POSSIBLE INTERVENTION;  Surgeon: Chuck Hint, MD;  Location: [facility] OR;  Service: Vascular;  Laterality: Right;  . Ligation of competing branches of arteriovenous fistula Right 12/17/2013    Procedure: LIGATION OF COMPETING BRANCHES OF ARTERIOVENOUS FISTULA;  Surgeon: Chuck Hint, MD;  Location: [facility] OR;  Service: Vascular;  Laterality: Right;  . Ligation of arteriovenous  fistula Right 01/19/2014    Procedure: LIGATION OF ARTERIOVENOUS  FISTULA- RIGHT BRACHIOCEPHALIC FISTULA;  Surgeon: Fransisco Hertz, MD;  Location: [facility] OR;  Service: Vascular;  Laterality: Right;  . Shuntogram Right 09/10/2013    Procedure: Fistulogram;  Surgeon: Fransisco Hertz, MD;  Location: [facility] CATH LAB;  Service: Cardiovascular;  Laterality: Right;    Social History History  Substance Use Topics  . Smoking status: Never Smoker   . Smokeless tobacco: Never Used  . Alcohol Use: No    Family History Family History  Problem Relation Age of Onset  . Family history unknown: Yes   no family history of porphyria or autoimmune disease  Allergies  Allergen Reactions  . Sulfa Antibiotics Nausea And Vomiting and Rash  . Sulfur Nausea And Vomiting and Rash  . Aspirin Nausea And Vomiting     REVIEW OF SYSTEMS (Negative unless checked)  Constitutional: Weight loss  Fever  Chills Cardiac: Chest pain   Chest pressure   Palpitations   Shortness of breath when laying flat   Shortness of breath at rest   Shortness of breath with exertion. Vascular:  Pain in legs with walking   Pain in legs at rest   Pain in legs when laying flat   Claudication   Pain in feet when walking  Pain in feet at rest  Pain in feet when laying flat   History of DVT    Phlebitis   Swelling in legs   Varicose veins   Non-healing ulcers Pulmonary:   Uses home oxygen   Productive cough   Hemoptysis   Wheeze  COPD   Asthma Neurologic:  Dizziness  Blackouts   Seizures   History of stroke   History of TIA  Aphasia   Temporary blindness   Dysphagia   Weakness or numbness in arms   Weakness or numbness in legs Musculoskeletal:  Arthritis   Joint swelling   Joint pain   Low back pain Hematologic:    Easy bruising  [] Easy bleeding   [] Hypercoagulable state   [] Anemic  [] Hepatitis Gastrointestinal:  [] Blood in stool   [] Vomiting blood  [] Gastroesophageal reflux/heartburn   [] Difficulty swallowing. Genitourinary:  [] Chronic kidney disease   [] Difficult urination  [] Frequent urination  [] Burning with urination   [] Blood in urine Skin:  [] Rashes   [] Ulcers   [] Wounds Psychological:  [x] History of anxiety   [x]  History of major depression.  Physical Examination  Filed Vitals:   06/28/15 2236  BP: 156/128  Pulse: 74  Temp: 98.2 F (36.8 C)  TempSrc: Oral  Resp: 15  Height: 5\' 1"  (1.549 m)  Weight: 120 lb (54.432 kg)  SpO2: 99%   Body mass index is 22.69 kg/(m^2). Gen: WD/WN, NAD Head: [place]/AT, No temporalis wasting.  Ear/Nose/Throat: Hearing grossly intact, nares w/o erythema or drainage, oropharynx w/o Erythema/Exudate, Eyes: PERRLA, EOMI.  Neck: Supple, no nuchal rigidity.  No bruit or JVD.  Pulmonary:  Good air movement, clear to auscultation bilaterally, no increased work of respiration or use of accessory muscles  Cardiac: RRR, normal S1, S2, no Murmurs, rubs or gallops. Vascular: The dressing is removed surrounding the right tunneled catheter and moderate hematomas noted there is a slight ooze from the exit site. Also of note there is a hematoma at the counterincision site measuring 3 x 4 cm. There is blood noted in both lumens of the tunneled catheter itself as if it has been manipulated Gastrointestinal: soft,  non-tender/non-distended. No guarding/reflex. No masses, surgical incisions, or scars. Musculoskeletal: M/S 5/5 throughout.  No deformity or atrophy. Neurologic: CN 2-12 intact. Pain and light touch intact in extremities.  Symmetrical.  Speech is fluent. Motor exam as listed above. Psychiatric: Judgment intact, Mood & affect appropriate for pt's clinical situation. Dermatologic: No rashes or ulcers noted.  No cellulitis or open wounds. Lymph : No Cervical, Axillary, or Inguinal lymphadenopathy.   CBC Lab Results  Component Value Date   WBC 4.7 04/16/2015   HGB 11.0* 04/16/2015   HCT 36.4 04/16/2015   MCV 96.6 04/16/2015   PLT 156 04/16/2015    BMET    Component Value Date/Time   NA 141 04/16/2015 1912   NA 135* 12/17/2014 0509   K 4.1 04/16/2015 1912   K 4.3 12/17/2014 0509   CL 96* 04/16/2015 1912   CL 98 12/17/2014 0509   CO2 28 04/16/2015 1912   CO2 29 12/17/2014 0509   GLUCOSE 87 04/16/2015 1912   GLUCOSE 87 12/17/2014 0509   BUN 51* 04/16/2015 1912   BUN 34* 12/17/2014 0509   CREATININE 5.12* 04/16/2015 1912   CREATININE 6.62* 12/17/2014 0509   CALCIUM 8.8* 04/16/2015 1912   CALCIUM 7.7* 12/17/2014 0509   CALCIUM 7.7* 06/10/2013 0609   GFRNONAA 7* 04/16/2015 1912   GFRNONAA 5* 06/16/2014 0406   GFRAA 8* 04/16/2015 1912   GFRAA 6* 06/16/2014 0406   CrCl cannot be calculated (Patient has no serum creatinine result on file.).  COAG Lab Results  Component Value Date   INR 1.2 12/13/2014   INR 1.11 06/04/2013   INR 1.17 04/25/2013    Assessment/Plan 1.  Complication of dialysis device with bleeding. Patient has been sutured and the bleeding has been resolved the catheters been flushed. She will be observed overnight given her bleeding episode. She will be dialyzed tomorrow to ensure that the catheter is functioning properly. 2.  End-stage renal disease requiring hemodialysis. Nephrology will be consult 3. Congestive heart failure she'll be monitored and  medicine will be consult  Schnier, Latina Craver, MD  06/29/2015 12:23 AM

## 2015-06-29 NOTE — Consult Note (Signed)
[facility] - [place] at [facility]   PATIENT NAME: [name]    MR#:  [id number]  DATE OF BIRTH:  18-Nov-1931  DATE OF ADMISSION:  06/28/2015  PRIMARY CARE PHYSICIAN: Fredirick Maudlin, MD   REQUESTING/REFERRING PHYSICIAN: Gilda Crease, MD  CHIEF COMPLAINT:   Chief Complaint  Patient presents with  . Post-op Problem    HISTORY OF PRESENT ILLNESS:  [name]  is a 81 y.o. female who presents with bleeding from the site of her tunneled dialysis catheter insertion. Patient has dementia, and her local dialysis center has been having difficulty utilizing her hero graft of her right upper extremity for dialysis as a state that she is unable to remain still. Patient has not had full dialysis session for couple of weeks. Due to the same she had a femoral tunnel dialysis catheter placed previously, but her local dialysis center stating repeatedly that they're having difficulty utilizing it and maintaining good flows, which is when has delayed her dialysis over the last couple weeks. For the same, she had this catheter replaced. However, when she got home she had her family noted bleeding from the insertion site. She returned to the ED and was seen by vascular surgeon to address this problem. After doing the same patient was admitted for observation and she developed a hematoma around the site, and also to ensure that she gets a good dialysis session prior to discharge. Hospitalists were consult him for assistance with medical management of her other medical problems.  PAST MEDICAL HISTORY:   Past Medical History  Diagnosis Date  . COPD (chronic obstructive pulmonary disease)     a. on home O2  . Pneumonia 05/2013  . Mental disorder     dementia - short term memory  . History of blood transfusion     after fistula ruptured  . Dementia   . ESRD (end stage renal disease)     a. HD MWF  . Chronic systolic CHF (congestive heart failure)     a. echo 06/14/14: EF 20-25%,  diffuse HK, AK of mid-apicalinferolateral myocardium, GRDD, PASP 44 mm Hg; b. echo 12/2014 EF 30-35%, diastolic dysfunction, global HK, unable to exclude RWMA, mod MR  . CAD (coronary artery disease)     a. presumed CAD - patient's son has declined cath to date    PAST SURGICAL HISTOIRY:   Past Surgical History  Procedure Laterality Date  . Dialysis fistula creation    . Ligation of arteriovenous  fistula Left 06/04/2013    Procedure: LIGATION OF ARTERIOVENOUS  FISTULA;  Surgeon: Fransisco Hertz, MD;  Location: [facility] OR;  Service: Vascular;  Laterality: Left;  . Insertion of dialysis catheter Right 06/04/2013    Procedure: INSERTION OF DIALYSIS CATHETER;  Surgeon: Fransisco Hertz, MD;  Location: [facility] OR;  Service: Vascular;  Laterality: Right;  . Cholecystectomy  1992  . Av fistula placement Right 07/07/2013    Procedure: ARTERIOVENOUS (AV) FISTULA CREATION - RIGHT BRACHIAL CEPHALIC ;  Surgeon: Fransisco Hertz, MD;  Location: [facility] OR;  Service: Vascular;  Laterality: Right;  . Removal of a dialysis catheter Right 11/09/2013    Procedure: REMOVAL OF A DIALYSIS CATHETER- Right TDC ;  Surgeon: Fransisco Hertz, MD;  Location: [facility] OR;  Service: Vascular;  Laterality: Right;  . Insertion of dialysis catheter Left 11/09/2013    Procedure: INSERTION OF DIALYSIS CATHETER- Left TDC;  Surgeon: Fransisco Hertz, MD;  Location: [facility] OR;  Service: Vascular;  Laterality: Left;  .  Exchange of a dialysis catheter Left 12/17/2013    Procedure: EXCHANGE OF A DIALYSIS CATHETER;  Surgeon: Chuck Hint, MD;  Location: [facility] OR;  Service: Vascular;  Laterality: Left;  . Fistulogram Right 12/17/2013    Procedure: FISTULOGRAM- RIGHT ARM- POSSIBLE INTERVENTION;  Surgeon: Chuck Hint, MD;  Location: [facility] OR;  Service: Vascular;  Laterality: Right;  . Ligation of competing branches of arteriovenous fistula Right 12/17/2013    Procedure: LIGATION OF COMPETING BRANCHES OF ARTERIOVENOUS FISTULA;  Surgeon: Chuck Hint, MD;   Location: [facility] OR;  Service: Vascular;  Laterality: Right;  . Ligation of arteriovenous  fistula Right 01/19/2014    Procedure: LIGATION OF ARTERIOVENOUS  FISTULA- RIGHT BRACHIOCEPHALIC FISTULA;  Surgeon: Fransisco Hertz, MD;  Location: [facility] OR;  Service: Vascular;  Laterality: Right;  . Shuntogram Right 09/10/2013    Procedure: Fistulogram;  Surgeon: Fransisco Hertz, MD;  Location: [facility] CATH LAB;  Service: Cardiovascular;  Laterality: Right;    SOCIAL HISTORY:   Social History  Substance Use Topics  . Smoking status: Never Smoker   . Smokeless tobacco: Never Used  . Alcohol Use: No    FAMILY HISTORY:   Family History  Problem Relation Age of Onset  . Family history unknown: Yes    DRUG ALLERGIES:   Allergies  Allergen Reactions  . Sulfa Antibiotics Nausea And Vomiting and Rash  . Sulfur Nausea And Vomiting and Rash  . Aspirin Nausea And Vomiting    REVIEW OF SYSTEMS:  Review of Systems  Unable to perform ROS: dementia  Endo/Heme/Allergies:       Hematoma at the site of femoral tunneled dialysis catheter    MEDICATIONS AT HOME:   Prior to Admission medications   Medication Sig Start Date End Date Taking? Authorizing Provider  acetaminophen (TYLENOL) 325 MG tablet Take 650 mg by mouth every 6 (six) hours as needed for mild pain.    Historical Provider, MD  atorvastatin (LIPITOR) 40 MG tablet Take 40 mg by mouth daily.  12/17/14   Historical Provider, MD  carvedilol (COREG) 3.125 MG tablet Take 3.125 mg by mouth 2 (two) times daily with a meal.  12/17/14   Historical Provider, MD  cephALEXin (KEFLEX) 500 MG capsule Take 1 capsule (500 mg total) by mouth 2 (two) times daily. 04/16/15   Raeford Razor, MD  clopidogrel (PLAVIX) 75 MG tablet Take 75 mg by mouth daily.  01/04/15   Historical Provider, MD  diazepam (VALIUM) 2 MG tablet Take 1 mg by mouth as needed for anxiety or sedation.     Historical Provider, MD  donepezil (ARICEPT) 10 MG tablet Take 10 mg by mouth at bedtime.    Historical  Provider, MD  hydrALAZINE (APRESOLINE) 10 MG tablet Take 10 mg by mouth 3 (three) times daily.  12/17/14   Historical Provider, MD  isosorbide mononitrate (IMDUR) 30 MG 24 hr tablet Take 30 mg by mouth daily.  12/21/14   Historical Provider, MD  Memantine HCl ER (NAMENDA XR) 28 MG CP24 Take 28 mg by mouth daily.    Historical Provider, MD  multivitamin (RENA-VIT) TABS tablet Take 1 tablet by mouth daily.    Historical Provider, MD  sevelamer carbonate (RENVELA) 2.4 G PACK Take 2.4 g by mouth 3 (three) times daily with meals.    Historical Provider, MD  sevelamer carbonate (RENVELA) 800 MG tablet Take 1,600 mg by mouth 3 (three) times daily with meals.     Historical Provider, MD  torsemide (DEMADEX) 20 MG  tablet Take 20 mg by mouth daily.    Historical Provider, MD      VITAL SIGNS:   Filed Vitals:   06/28/15 2236 06/29/15 0205 06/29/15 0219  BP: 156/128 161/133 142/64  Pulse: 74 75 73  Temp: 98.2 F (36.8 C)    TempSrc: Oral    Resp: 15 20 18   Height: 5\' 1"  (1.549 m)    Weight: 54.432 kg (120 lb)    SpO2: 99% 95% 95%   Wt Readings from Last 3 Encounters:  06/28/15 54.432 kg (120 lb)  06/28/15 51.256 kg (113 lb)  06/07/15 51.256 kg (113 lb)    PHYSICAL EXAMINATION:  Physical Exam  Vitals reviewed. Constitutional: She appears well-developed and well-nourished. No distress.  HENT:  Head: Normocephalic and atraumatic.  Mouth/Throat: Oropharynx is clear and moist.  Eyes: Conjunctivae and EOM are normal. Pupils are equal, round, and reactive to light. No scleral icterus.  Neck: Normal range of motion. Neck supple. No JVD present. No thyromegaly present.  Cardiovascular: Normal rate, regular rhythm and intact distal pulses.  Exam reveals no gallop and no friction rub.   No murmur heard. Respiratory: Effort normal and breath sounds normal. No respiratory distress. She has no wheezes. She has no rales.  GI: Soft. Bowel sounds are normal. She exhibits no distension. There is no  tenderness.  Musculoskeletal: Normal range of motion. She exhibits no edema.  No arthritis, no gout  Lymphadenopathy:    She has no cervical adenopathy.  Neurological: She is alert. No cranial nerve deficit.  Patient follows commands, no dysphagia, no dysarthria.  Skin: Skin is warm and dry. No rash noted. No erythema.  Hematoma and right femoral dialysis catheter insertion site, mildly tender to palpation. Prior dialysis catheter insertion incision closed.  Psychiatric:  Unable to assess due to dementia     LABORATORY PANEL:   CBC No results for input(s): WBC, HGB, HCT, PLT in the last 168 hours. ------------------------------------------------------------------------------------------------------------------  Chemistries  No results for input(s): NA, K, CL, CO2, GLUCOSE, BUN, CREATININE, CALCIUM, MG, AST, ALT, ALKPHOS, BILITOT in the last 168 hours.  Invalid input(s): GFRCGP ------------------------------------------------------------------------------------------------------------------  Cardiac Enzymes No results for input(s): TROPONINI in the last 168 hours. ------------------------------------------------------------------------------------------------------------------  RADIOLOGY:  No results found.  EKG:   Orders placed or performed in visit on 04/05/15  . EKG 12-Lead    IMPRESSION AND PLAN:  Active Problems:   ESRD (end stage renal disease) on dialysis - nephrology consult cardio in place for dialysis support. Recommend follow nephrology recommendations for medications related to end-stage renal disease and dialysis. Patient had recent replacement of tunneled dialysis catheter, see history of present illness for details.   Hematoma - at the site of insertion of the tunnel dialysis catheter. Monitor, check CBC for hemoglobin level, and if hematoma is enlarging recommend serial hemoglobin checks.   Dementia - continue home medications for this   CAD (coronary artery  disease) - continue home medications   Chronic systolic CHF (congestive heart failure) - continue home meds for this. Patient has torsemide was some home meds, however she is a dialysis patient, so unclear if there is any significant utility in diuretic. Defer to nephrology's recommendations for this.  All the records are reviewed and case discussed with ED provider. Management plans discussed with the patient and/or family.  CODE STATUS: full  TOTAL TIME TAKING CARE OF THIS PATIENT: 35 minutes.    Byan Poplaski FIELDING 06/29/2015, 2:29 AM  [facility]  Office  [phone]  CC: Primary care Physician: Fredirick Maudlin, MD

## 2015-06-29 NOTE — ED Notes (Signed)
Prime MD at the bedside for pt evaluation

## 2015-06-29 NOTE — Progress Notes (Signed)
[facility] - [place] at [facility]   PATIENT NAME: [name]    MR#:  [id number]  DATE OF BIRTH:  February 22, 1931  SUBJECTIVE:  CHIEF COMPLAINT:   Chief Complaint  Patient presents with  . 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 He has subsided. Patient denies any discomfort or shortness of breath or chest pains  Review of Systems  Unable to perform ROS: dementia    VITAL SIGNS: Blood pressure 133/65, pulse 74, temperature 98.2 F (36.8 C), temperature source Oral, resp. rate 18, height  (1.6 m), weight 48.852 kg (107 lb 11.2 oz), SpO2 98 %.  PHYSICAL EXAMINATION:   GENERAL:  81 y.o.-year-old patient lying in the bed with no acute distress.  EYES: Pupils equal, round, reactive to light and accommodation. No scleral icterus. Extraocular muscles intact.  HEENT: Head atraumatic, normocephalic. Oropharynx and nasopharynx clear.  NECK:  Supple, no jugular venous distention. No thyroid enlargement, no tenderness.  LUNGS: Normal breath sounds bilaterally, no wheezing, rales,rhonchi or crepitation. No use of accessory muscles of respiration.  CARDIOVASCULAR: S1, S2 normal. No murmurs, rubs, or gallops.  ABDOMEN: Soft, nontender, nondistended. Bowel sounds present. No organomegaly or mass.  EXTREMITIES: No pedal edema, cyanosis, or clubbing. Right femoral tunneled dialysis catheter area is dressed but no bleeding was noted hematoma in the thigh was noted. Left extremity fistula is nonfunctional no bruit or thrill NEUROLOGIC: Cranial nerves II through XII are intact. Muscle strength 5/5 in all extremities. Sensation intact. Gait not checked.  PSYCHIATRIC: The patient is alert and oriented x 3.  SKIN: No obvious rash, lesion, or ulcer.   ORDERS/RESULTS REVIEWED:    CBC  Recent Labs Lab 06/29/15 0345  WBC 8.1  HGB 10.4*  HCT 32.4*  PLT 127*  MCV 90.6  MCH 29.0  MCHC 32.0  RDW 19.0*   ------------------------------------------------------------------------------------------------------------------  Chemistries   Recent Labs Lab 06/29/15 0345  NA 139  K 4.9  CL 99*  CO2 24  GLUCOSE 92  BUN 98*  CREATININE 7.36*  CALCIUM 8.1*   ------------------------------------------------------------------------------------------------------------------ estimated creatinine clearance is 4.5 mL/min (by C-G formula based on Cr of 7.36). ------------------------------------------------------------------------------------------------------------------ No results for input(s): TSH, T4TOTAL, T3FREE, THYROIDAB in the last 72 hours.  Invalid input(s): FREET3  Cardiac Enzymes No results for input(s): CKMB, TROPONINI, MYOGLOBIN in the last 168 hours.  Invalid input(s): CK ------------------------------------------------------------------------------------------------------------------ Invalid input(s): POCBNP ---------------------------------------------------------------------------------------------------------------  RADIOLOGY: No results found.  EKG:  Orders placed or performed in visit on 04/05/15  . EKG 12-Lead    ASSESSMENT AND PLAN:  Active Problems:   ESRD (end stage renal disease) on dialysis   Dementia   CAD (coronary artery disease)   Chronic systolic CHF (congestive heart failure)   Hematoma 1. Acute posthemorrhagic anemia due to bleeding from right tunneled femoral catheter with underlying hematoma , hemoglobin level is lower than on admission however seemed to be stable and no significant bleeding or swelling noted at present following in the morning 2. Bleeding from right femoral catheter and the soft tissue hematoma in the right thigh, vascular surgery was sutured the area and bleeding has resolved this with surgery to  decide regard to discharge planning 3. End-stage renal disease with an tunneled hemodialysis catheter IN THE RIGHT THIGH,  PATIENT WILL BE GETTING HEMODIALYSIS TODAY to ensure that the catheter  is functioning well 4. Dementia with difficulty to perform hemodialysis in an outpatient center because of restlessness and inability to follow commands per report, good competitive care involved for further recommendations referable to discuss this patient's family possible need of medication therapy  pre-hemodialysis, initiated Risperdal as needed   Management plans discussed with the patient, family and they are in agreement.   DRUG ALLERGIES:  Allergies  Allergen Reactions  . Sulfa Antibiotics Nausea And Vomiting and Rash  . Sulfur Nausea And Vomiting and Rash  . Aspirin Nausea And Vomiting    CODE STATUS:     Code Status Orders        Start     Ordered   06/29/15 0333  Full code   Continuous     06/29/15 0332      TOTAL TIME TAKING CARE OF THIS PATIENT: 35  minutes.   Patient's treatment and follow-up plan was discussed with Dr. Lorelee Cover M.D on 06/29/2015 at 2:02 PM  Between 7am to 6pm - Pager - [phone]  After 6pm go to [email or web address] - password [email or web address] [facility]  [place] [facility]  Office  [phone]  CC: Primary care physician; Fredirick Maudlin, MD

## 2015-06-30 LAB — HEMOGLOBIN: HEMOGLOBIN: 9.5 g/dL — AB (ref 12.0–16.0)

## 2015-06-30 NOTE — Care Management Note (Signed)
Patient is active at [facility] on a MWF schedule.  Spoke with clinic and patient does NOT have any behavior issues at center.  Recent problem had been poor blood flow from cath during treatments. Ivor Reining  Dialysis Liaison  [phone]

## 2015-06-30 NOTE — Progress Notes (Signed)
[facility] Physicians - [place] at [facility]   PATIENT NAME: [name]    MR#:  [id number]  DATE OF BIRTH:  07-29-1931  SUBJECTIVE:  CHIEF COMPLAINT:   Chief Complaint  Patient presents with  . Post-op Problem   patient is a 81 year old female with history of end stage renal disease on hemodialysis who was admitted to the hospital because of bleeding from right femoral tunneled dialysis catheter which was placed recently. Bleeding has subsided. Patient denies any discomfort or shortness of breath or chest pains. She underwent uneventful hemodialysis session yesterday on 06/29/2015.   Review of Systems  Unable to perform ROS: dementia    VITAL SIGNS: Blood pressure 125/60, pulse 73, temperature 98 F (36.7 C), temperature source Oral, resp. rate 18, height  (1.6 m), weight 47.5 kg (104 lb 11.5 oz), SpO2 96 %.  PHYSICAL EXAMINATION:   GENERAL:  81 y.o.-year-old patient lying in the bed with no acute distress.  EYES: Pupils equal, round, reactive to light and accommodation. No scleral icterus. Extraocular muscles intact.  HEENT: Head atraumatic, normocephalic. Oropharynx and nasopharynx clear.  NECK:  Supple, no jugular venous distention. No thyroid enlargement, no tenderness.  LUNGS: Normal breath sounds bilaterally, no wheezing, rales,rhonchi or crepitation. No use of accessory muscles of respiration.  CARDIOVASCULAR: S1, S2 normal. No murmurs, rubs, or gallops.  ABDOMEN: Soft, nontender, nondistended. Bowel sounds present. No organomegaly or mass.  EXTREMITIES: No pedal edema, cyanosis, or clubbing. Right femoral tunneled dialysis catheter area is dressed but no bleeding was noted hematoma in the thigh was noted. Left extremity fistula is nonfunctional no bruit or thrill. Dressing on the entrance site of hemodialysis catheter is soaked with blood but no active bleeding was noted at present NEUROLOGIC: Cranial nerves II through XII are intact. Muscle strength 5/5  in all extremities. Sensation intact. Gait not checked.  PSYCHIATRIC: The patient is alert and oriented x 3.  SKIN: No obvious rash, lesion, or ulcer.   ORDERS/RESULTS REVIEWED:   CBC  Recent Labs Lab 06/29/15 0345  WBC 8.1  HGB 10.4*  HCT 32.4*  PLT 127*  MCV 90.6  MCH 29.0  MCHC 32.0  RDW 19.0*   ------------------------------------------------------------------------------------------------------------------  Chemistries   Recent Labs Lab 06/29/15 0345  NA 139  K 4.9  CL 99*  CO2 24  GLUCOSE 92  BUN 98*  CREATININE 7.36*  CALCIUM 8.1*   ------------------------------------------------------------------------------------------------------------------ estimated creatinine clearance is 4.4 mL/min (by C-G formula based on Cr of 7.36). ------------------------------------------------------------------------------------------------------------------ No results for input(s): TSH, T4TOTAL, T3FREE, THYROIDAB in the last 72 hours.  Invalid input(s): FREET3  Cardiac Enzymes No results for input(s): CKMB, TROPONINI, MYOGLOBIN in the last 168 hours.  Invalid input(s): CK ------------------------------------------------------------------------------------------------------------------ Invalid input(s): POCBNP ---------------------------------------------------------------------------------------------------------------  RADIOLOGY: No results found.  EKG:  Orders placed or performed in visit on 04/05/15  . EKG 12-Lead    ASSESSMENT AND PLAN:  Active Problems:   ESRD (end stage renal disease) on dialysis   Dementia   CAD (coronary artery disease)   Chronic systolic CHF (congestive heart failure)   Hematoma 1. Acute posthemorrhagic anemia due to bleeding from right tunneled femoral catheter with underlying hematoma , hemoglobin level was lower yesterday than on admission however  no bleeding were noted anymore. Although patient's dressing is blood soaked.  2.  Bleeding from right femoral catheter and the soft tissue hematoma in the right thigh, vascular surgery has sutured the area and bleeding has resolved. Patient underwent hemodialysis yesterday and tolerated it well.  Discussed with care management in regards to patient's discharge planning. Patient's family is not seen while she was in the hospital patient reports having son who visits her, but she lives alone. Physical therapy will be ordered to evaluate patient's physical capacity, as there is a concern that she cannot live alone. Vascular surgery to make arrangements for discharge planning 3. End-stage renal disease with an tunneled hemodialysis catheter IN THE RIGHT THIGH,  status post  HEMODIALYSIS 10TH OF AUGUST 2016 ,  catheter is functioning well 4. Dementia with difficulty to perform hemodialysis in an outpatient center because of restlessness and inability to follow commands per report, continue Risperdal as needed, getting care management involved to discuss with patient's family discharge planning. Patient is severely demented and that she is at risk of falls and for general safety since she lives alone at home. Patient is incapable to care for herself and make decisions about medical management.       DRUG ALLERGIES:  Allergies  Allergen Reactions  . Sulfa Antibiotics Nausea And Vomiting and Rash  . Sulfur Nausea And Vomiting and Rash  . Aspirin Nausea And Vomiting    CODE STATUS:     Code Status Orders        Start     Ordered   06/29/15 0333  Full code   Continuous     06/29/15 0332      TOTAL TIME TAKING CARE OF THIS PATIENT: 35  minutes.  Discharge planning was discussed with care management  Shelbylynn Walczyk M.D on 06/30/2015 at 10:53 AM  Between 7am to 6pm - Pager - [phone]  After 6pm go to [email or web address] - password [email or web address] [facility]  [place] [facility]  Office  [phone]  CC: Primary care physician; Fredirick Maudlin, MD

## 2015-06-30 NOTE — Progress Notes (Signed)
Bethann BerkshireriValarie MerinohJolyn NapChad CordialDerwood KaplanZenda AlpersRolland Porter hBethann BerkshireDoValarie MerinorthJolyn NapChad CordialDerwood KaplanZenda AlpersRolland Porter erinowCenter For Health Ambulatory SAce GinNacogdoches Surgery Centersy Center LLCDTEXTTAG>ry Perueldon6 [id number]Fenton G>GinsLNaval Hospital JacksonvilleospitalInc40939

## 2015-06-30 NOTE — Progress Notes (Signed)
Subjective:   Patient known to our practice from previous admission. She is admitted for bleeding from exit site of her right femoral tunneled dialysis catheter. She has a history of dementia and as per notes, she is not remaining still for dialysis therefore there is some difficulty in using her regular access.  We tried to use her AV graft yesterday but she moved her arm and venous line infiltrated.  Therefore she was dialyzed via PermCath. According to flow sheet, blood flow rate of 290 cc was obtained.  Today, again, she is oriented to self. She does not know the place or the time. She is able to answer some questions. She denies any pain.  Objective:  Vital signs in last 24 hours:  Temp:  [97.3 F (36.3 C)-98 F (36.7 C)] 98 F (36.7 C) (08/11 0733) Pulse Rate:  [73-81] 73 (08/11 0733) Resp:  [17-23] 18 (08/11 0733) BP: (112-144)/(49-110) 125/60 mmHg (08/11 0733) SpO2:  [96 %-98 %] 96 % (08/11 0733) Weight:  [47.5 kg (104 lb 11.5 oz)] 47.5 kg (104 lb 11.5 oz) (08/10 1615)  Weight change: -4.232 kg (-9 lb 5.3 oz) Filed Weights   06/29/15 0327 06/29/15 1200 06/29/15 1615  Weight: 48.852 kg (107 lb 11.2 oz) 50.2 kg (110 lb 10.7 oz) 47.5 kg (104 lb 11.5 oz)    Intake/Output: I/O last 3 completed shifts: In: 0  Out: 500 [Other:500]     Physical Exam: General:  no acute distress, laying in the bed, poor hygiene,   HEENT  anicteric, moist mucous membranes   Neck  supple, no masses   Pulm/lungs  normal breathing effort, scattered rhonchi, no crackles   CVS/Heart  no rub or gallop, regular rhythm   Abdomen:   soft, nontender, nondistended   Extremities:  no peripheral edema   Neurologic:  alert, able to communicate, oriented only to self, follows simple commands   Skin:  scratch marks over her lower extremities bilaterally   Access:  right femoral PermCath.        Basic Metabolic Panel:  Recent Labs Lab 06/29/15 0345  NA 139  K 4.9  CL 99*  CO2 24  GLUCOSE 92  BUN  98*  CREATININE 7.36*  CALCIUM 8.1*     CBC:  Recent Labs Lab 06/29/15 0345  WBC 8.1  HGB 10.4*  HCT 32.4*  MCV 90.6  PLT 127*      Microbiology: Results for orders placed or performed in visit on 12/13/14  Culture, blood (single)     Status: None   Collection Time: 12/13/14  1:39 PM  Result Value Ref Range Status   Micro Text Report   Final       ORGANISM 1                Escherichia coli   COMMENT                   IN AEROBIC BOTTLE ONLY   GRAM STAIN                GRAM NEGATIVE ROD   ANTIBIOTIC                    ORG#1     AMPICILLIN                    S         CEFAZOLIN  S         CEFOXITIN                     S         CEFTAZIDIME                   S         CEFTRIAXONE                   S         CIPROFLOXACIN                 S         GENTAMICIN                    S         IMIPENEM                      S         LEVOFLOXACIN                  S           Culture, blood (single)     Status: None   Collection Time: 12/13/14  1:40 PM  Result Value Ref Range Status   Micro Text Report   Final       SOURCE: END    ORGANISM 1                Escherichia coli   ORGANISM 2                -   COMMENT                   IN AEROBIC AND ANAEROBIC BOTTLES   COMMENT                   -   GRAM STAIN                GRAM NEGATIVE ROD   GRAM STAIN                GRAM NEGATIVE ROD   ANTIBIOTIC                    ORG#1     AMPICILLIN                    S         CEFAZOLIN                     S         CEFOXITIN                     S         CEFTAZIDIME                   S         CEFTRIAXONE                   S         CIPROFLOXACIN                 S         GENTAMICIN  S         IMIPENEM                      S         LEVOFLOXACIN                  S           Clostridium Difficile [facility])     Status: None   Collection Time: 12/13/14  8:29 PM  Result Value Ref Range Status   Micro Text Report   Final       C.DIFFICILE  ANTIGEN       C.DIFFICILE GDH ANTIGEN : POSITIVE   C.DIFFICILE TOXIN A/B     C.DIFFICILE TOXINS A AND B : NEGATIVE   PCR FOR TOXIGENIC C.DIFF  PCR FOR TOXIGENIC C.DIFFICILE : NEGATIVE   INTERPRETATION            Negative for toxigenic  C. difficile. Toxin gene and active toxin production not detected. May be a nontoxigenic strain of C. difficile bacteria present, lacking the ability to produce toxin.    ANTIBIOTIC                                                      Culture, blood (single)     Status: None   Collection Time: 12/15/14  5:40 PM  Result Value Ref Range Status   Micro Text Report   Final       COMMENT                   NO GROWTH AEROBICALLY/ANAEROBICALLY IN 5 DAYS   ANTIBIOTIC                                                      Culture, blood (single)     Status: None   Collection Time: 12/15/14  5:50 PM  Result Value Ref Range Status   Micro Text Report   Final       COMMENT                   NO GROWTH AEROBICALLY/ANAEROBICALLY IN 5 DAYS   ANTIBIOTIC                                                        Coagulation Studies: No results for input(s): LABPROT, INR in the last 72 hours.  Urinalysis: No results for input(s): COLORURINE, LABSPEC, PHURINE, GLUCOSEU, HGBUR, BILIRUBINUR, KETONESUR, PROTEINUR, UROBILINOGEN, NITRITE, LEUKOCYTESUR in the last 72 hours.  Invalid input(s): APPERANCEUR    Imaging: No results found.   Medications:     . atorvastatin  40 mg Oral Daily  . b complex-vitamin c-folic acid  1 tablet Oral Daily  . carvedilol  3.125 mg Oral BID WC  . docusate sodium  100 mg Oral Daily  . donepezil  10 mg Oral QHS  . epoetin (EPOGEN/PROCRIT) injection  10,000 Units Intravenous Q M,W,F-HD  . isosorbide mononitrate  30 mg Oral Daily  . memantine  28 mg Oral Daily  . pantoprazole  40 mg Oral Daily   sodium chloride, acetaminophen **OR** acetaminophen, alum & mag hydroxide-simeth, bisacodyl, guaiFENesin-dextromethorphan, magnesium sulfate 1  - 4 g bolus IVPB, ondansetron, oxyCODONE, polyethylene glycol, potassium chloride, risperiDONE  Assessment/ Plan:  81 y.o. caucasian female with ESRD who dialyzes at [facility], dialyzes Monday, Wednesday, Friday, followed by Dr. Pat Kocher, other medical problems including COPD, dementia, chronic systolic congestive heart failure with EF 30-35%, coronary disease  1. ESRD. [facility] Lennert, MWF, Dialysis tomorrow 2. AOCKD - Continue Procrit IV with dialysis treatments 3. Secondary hyperparathyroidism - Currently not on any binder. We will continue to monitor her phosphorus 4. Access complications. She has bleeding from PermCath exit site. Vascular surgery team is following.   LOS:  [name] 8/11/20162:36 PM

## 2015-06-30 NOTE — Progress Notes (Signed)
PT Cancellation Note  Patient Details Name: [name] MRN: [id number] DOB: 05-Jul-1931   Cancelled Treatment:    Reason Eval/Treat Not Completed: Other (comment) (Consult received and chart reviewed.  Per primary RN, patient currently sleeping; pending discharge this date.  Has been agitated this date; RN requests therapist hold eval at this time and allow patinne to sleep.  Will re-attempt at later time/date)   Tommy Rainwater. Manson Passey, PT, DPT, NCS 06/30/2015, 2:28 PM [phone]

## 2015-07-01 LAB — HEPATITIS B SURFACE ANTIBODY,QUALITATIVE: Hep B S Ab: NONREACTIVE

## 2015-07-01 LAB — HEPATITIS B SURFACE ANTIGEN: HEP B S AG: NEGATIVE

## 2015-07-01 LAB — HEPATITIS B CORE ANTIBODY, TOTAL: HEP B C TOTAL AB: NEGATIVE

## 2015-07-19 NOTE — Discharge Summary (Signed)
[facility]    Discharge Summary    Patient ID:  [name] MRN: [id number] DOB/AGE: 79-Dec-1932 79 y.o.  Admit date: 06/28/2015 Discharge date: 07/19/2015 Date of Surgery: * No surgery found * Surgeon:   Admission Diagnosis: r leg bleeding  Discharge Diagnoses:  r leg bleeding  Secondary Diagnoses: Past Medical History  Diagnosis Date  . COPD (chronic obstructive pulmonary disease)     a. on home O2  . Pneumonia 05/2013  . Mental disorder     dementia - short term memory  . History of blood transfusion     after fistula ruptured  . Dementia   . ESRD (end stage renal disease)     a. HD MWF  . Chronic systolic CHF (congestive heart failure)     a. echo 06/14/14: EF 20-25%, diffuse HK, AK of mid-apicalinferolateral myocardium, GRDD, PASP 44 mm Hg; b. echo 12/2014 EF 30-35%, diastolic dysfunction, global HK, unable to exclude RWMA, mod MR  . CAD (coronary artery disease)     a. presumed CAD - patient's son has declined cath to date      Discharged Condition: good  HPI:  Patient was brought back to the emergency room after a permacath was placed due to the lateness of the hour she was observed until the following morning she underwent placement of the stitch around the catheter exit site in the emergency room with resolution of the bleeding problem. She was successfully dialyzed via her catheter today August 11 and is discharged home  Hospital Course:  [name] is a 81 y.o. female is S/P Right thigh catheter insertion for dialysis  Extubated: POD # 0 Physical exam: Right thigh catheter no further bleeding after suture placed Post-op wounds clean, dry, intact or healing well Pt. Ambulating, voiding and taking PO diet without difficulty. Pt pain controlled with PO pain meds. Labs as below Complications:none  Consults:  Treatment Team:  Katharina Caper, MD Mosetta Pigeon, MD  Significant Diagnostic Studies: CBC Lab Results  Component  Value Date   WBC 8.1 06/29/2015   HGB 9.5* 06/30/2015   HCT 32.4* 06/29/2015   MCV 90.6 06/29/2015   PLT 127* 06/29/2015    BMET    Component Value Date/Time   NA 139 06/29/2015 0345   NA 135* 12/17/2014 0509   K 4.9 06/29/2015 0345   K 4.3 12/17/2014 0509   CL 99* 06/29/2015 0345   CL 98 12/17/2014 0509   CO2 24 06/29/2015 0345   CO2 29 12/17/2014 0509   GLUCOSE 92 06/29/2015 0345   GLUCOSE 87 12/17/2014 0509   BUN 98* 06/29/2015 0345   BUN 34* 12/17/2014 0509   CREATININE 7.36* 06/29/2015 0345   CREATININE 6.62* 12/17/2014 0509   CALCIUM 8.1* 06/29/2015 0345   CALCIUM 7.7* 12/17/2014 0509   CALCIUM 7.7* 06/10/2013 0609   GFRNONAA 5* 06/29/2015 0345   GFRNONAA 6* 12/17/2014 0509   GFRNONAA 5* 06/16/2014 0406   GFRAA 5* 06/29/2015 0345   GFRAA 8* 12/17/2014 0509   GFRAA 6* 06/16/2014 0406   COAG Lab Results  Component Value Date   INR 1.2 12/13/2014   INR 1.11 06/04/2013   INR 1.17 04/25/2013     Disposition:  Discharge to :Home    Medication List    TAKE these medications        acetaminophen 325 MG tablet  Commonly known as:  TYLENOL  Take 650 mg by mouth every 6 (six) hours as needed for mild  pain.     atorvastatin 40 MG tablet  Commonly known as:  LIPITOR  Take 40 mg by mouth daily.     carvedilol 3.125 MG tablet  Commonly known as:  COREG  Take 3.125 mg by mouth 2 (two) times daily with a meal.     cephALEXin 500 MG capsule  Commonly known as:  KEFLEX  Take 1 capsule (500 mg total) by mouth 2 (two) times daily.     clopidogrel 75 MG tablet  Commonly known as:  PLAVIX  Take 75 mg by mouth daily.     diazepam 2 MG tablet  Commonly known as:  VALIUM  Take 1 mg by mouth as needed for anxiety or sedation.     donepezil 10 MG tablet  Commonly known as:  ARICEPT  Take 10 mg by mouth at bedtime.     hydrALAZINE 10 MG tablet  Commonly known as:  APRESOLINE  Take 10 mg by mouth 3 (three) times daily.     isosorbide mononitrate 30 MG 24 hr  tablet  Commonly known as:  IMDUR  Take 30 mg by mouth daily.     multivitamin Tabs tablet  Take 1 tablet by mouth daily.     NAMENDA XR 28 MG Cp24 24 hr capsule  Generic drug:  memantine  Take 28 mg by mouth daily.     PROSOL 20 % Soln  Inject 4 L into the vein 7 days.     sevelamer carbonate 2.4 G Pack  Commonly known as:  RENVELA  Take 2.4 g by mouth 3 (three) times daily with meals.     sevelamer carbonate 800 MG tablet  Commonly known as:  RENVELA  Take 1,600 mg by mouth 3 (three) times daily with meals.     torsemide 20 MG tablet  Commonly known as:  DEMADEX  Take 20 mg by mouth daily.       Verbal and written Discharge instructions given to the patient. Wound care per Discharge AVS     Follow-up Information    Follow up with Chucky Homes, Latina Craver, MD On 07/18/2015.   Specialties:  Vascular Surgery, Cardiology, Radiology, Vascular Surgery   Why:  Monday at 11:00am for follow up after procedure   Contact information:   [address] [place] [address] [phone]       Signed: Renford Dills, MD  07/19/2015, 12:00 PM

## 2015-08-09 ENCOUNTER — Encounter: Payer: Self-pay | Admitting: Vascular Surgery

## 2015-09-01 ENCOUNTER — Other Ambulatory Visit: Payer: Self-pay | Admitting: Vascular Surgery

## 2015-09-02 ENCOUNTER — Ambulatory Visit
Admission: RE | Admit: 2015-09-02 | Discharge: 2015-09-02 | Disposition: A | Discharge status: Home / Self Care | Payer: Medicare HMO | Source: Ambulatory Visit | Attending: Vascular Surgery | Admitting: Vascular Surgery

## 2015-09-02 DIAGNOSIS — Z992 Dependence on renal dialysis: Secondary | ICD-10-CM | POA: Insufficient documentation

## 2015-09-02 DIAGNOSIS — N186 End stage renal disease: Secondary | ICD-10-CM | POA: Diagnosis not present

## 2015-09-02 MED ORDER — SODIUM CHLORIDE 0.9 % IV SOLN
5.0000 mg | Freq: Once | INTRAVENOUS | Status: AC
  Administered 2015-09-02: 5 mg via INTRAVENOUS
  Filled 2015-09-02: qty 5

## 2015-09-27 ENCOUNTER — Other Ambulatory Visit: Payer: Self-pay | Admitting: Vascular Surgery

## 2015-09-28 ENCOUNTER — Encounter: Payer: Self-pay | Admitting: *Deleted

## 2015-09-28 ENCOUNTER — Observation Stay
Admission: EM | Admit: 2015-09-28 | Discharge: 2015-10-01 | Disposition: A | Discharge status: Home / Self Care | Payer: Medicare HMO | Attending: Vascular Surgery | Admitting: Vascular Surgery

## 2015-09-28 ENCOUNTER — Encounter
Admission: RE | Disposition: A | Discharge status: Home / Self Care | Payer: Self-pay | Source: Ambulatory Visit | Attending: Vascular Surgery

## 2015-09-28 ENCOUNTER — Ambulatory Visit
Admission: RE | Admit: 2015-09-28 | Discharge: 2015-09-28 | Disposition: A | Discharge status: Home / Self Care | Payer: Medicare HMO | Source: Ambulatory Visit | Attending: Vascular Surgery | Admitting: Vascular Surgery

## 2015-09-28 DIAGNOSIS — I2510 Atherosclerotic heart disease of native coronary artery without angina pectoris: Secondary | ICD-10-CM | POA: Diagnosis not present

## 2015-09-28 DIAGNOSIS — J449 Chronic obstructive pulmonary disease, unspecified: Secondary | ICD-10-CM | POA: Insufficient documentation

## 2015-09-28 DIAGNOSIS — T829XXA Unspecified complication of cardiac and vascular prosthetic device, implant and graft, initial encounter: Principal | ICD-10-CM | POA: Diagnosis present

## 2015-09-28 DIAGNOSIS — N2581 Secondary hyperparathyroidism of renal origin: Secondary | ICD-10-CM | POA: Insufficient documentation

## 2015-09-28 DIAGNOSIS — F0390 Unspecified dementia without behavioral disturbance: Secondary | ICD-10-CM | POA: Insufficient documentation

## 2015-09-28 DIAGNOSIS — I5022 Chronic systolic (congestive) heart failure: Secondary | ICD-10-CM | POA: Insufficient documentation

## 2015-09-28 DIAGNOSIS — N186 End stage renal disease: Secondary | ICD-10-CM | POA: Diagnosis present

## 2015-09-28 DIAGNOSIS — D631 Anemia in chronic kidney disease: Secondary | ICD-10-CM | POA: Diagnosis not present

## 2015-09-28 DIAGNOSIS — I120 Hypertensive chronic kidney disease with stage 5 chronic kidney disease or end stage renal disease: Secondary | ICD-10-CM | POA: Diagnosis not present

## 2015-09-28 DIAGNOSIS — Z992 Dependence on renal dialysis: Secondary | ICD-10-CM | POA: Diagnosis not present

## 2015-09-28 HISTORY — PX: PERIPHERAL VASCULAR CATHETERIZATION: SHX172C

## 2015-09-28 SURGERY — DIALYSIS/PERMA CATHETER INSERTION
Anesthesia: Moderate Sedation

## 2015-09-28 MED ORDER — ISOSORBIDE MONONITRATE ER 30 MG PO TB24
30.0000 mg | ORAL_TABLET | Freq: Every day | ORAL | Status: DC
  Administered 2015-09-29 – 2015-10-01 (×3): 30 mg via ORAL
  Filled 2015-09-28 (×3): qty 1

## 2015-09-28 MED ORDER — POLYETHYLENE GLYCOL 3350 17 G PO PACK: 17.0000 g | PACK | Freq: Every day | ORAL | Status: DC | PRN

## 2015-09-28 MED ORDER — MORPHINE SULFATE (PF) 2 MG/ML IV SOLN: 2.0000 mg | INTRAVENOUS | Status: DC | PRN

## 2015-09-28 MED ORDER — DOCUSATE SODIUM 100 MG PO CAPS
100.0000 mg | ORAL_CAPSULE | Freq: Two times a day (BID) | ORAL | Status: DC
  Administered 2015-09-29 – 2015-10-01 (×6): 100 mg via ORAL
  Filled 2015-09-28 (×6): qty 1

## 2015-09-28 MED ORDER — SODIUM CHLORIDE 0.9 % IV SOLN
INTRAVENOUS | Status: DC
  Administered 2015-09-28: 10:00:00 via INTRAVENOUS

## 2015-09-28 MED ORDER — LIDOCAINE-EPINEPHRINE (PF) 1 %-1:200000 IJ SOLN
INTRAMUSCULAR | Status: AC
  Filled 2015-09-28: qty 30

## 2015-09-28 MED ORDER — ACETAMINOPHEN 650 MG RE SUPP: 650.0000 mg | Freq: Four times a day (QID) | RECTAL | Status: DC | PRN

## 2015-09-28 MED ORDER — LIDOCAINE-EPINEPHRINE (PF) 1 %-1:200000 IJ SOLN
INTRAMUSCULAR | Status: DC | PRN
  Administered 2015-09-28: 10 mL via INTRADERMAL

## 2015-09-28 MED ORDER — FENTANYL CITRATE (PF) 100 MCG/2ML IJ SOLN
INTRAMUSCULAR | Status: DC | PRN
  Administered 2015-09-28: 100 ug via INTRAVENOUS

## 2015-09-28 MED ORDER — CLOPIDOGREL BISULFATE 75 MG PO TABS
75.0000 mg | ORAL_TABLET | Freq: Every day | ORAL | Status: DC
  Administered 2015-09-29 – 2015-10-01 (×3): 75 mg via ORAL
  Filled 2015-09-28 (×3): qty 1

## 2015-09-28 MED ORDER — CEPHALEXIN 500 MG PO CAPS
500.0000 mg | ORAL_CAPSULE | Freq: Two times a day (BID) | ORAL | Status: DC
  Administered 2015-09-29 – 2015-10-01 (×6): 500 mg via ORAL
  Filled 2015-09-28 (×6): qty 1

## 2015-09-28 MED ORDER — SEVELAMER CARBONATE 800 MG PO TABS: 1600.0000 mg | ORAL_TABLET | Freq: Three times a day (TID) | ORAL | Status: DC

## 2015-09-28 MED ORDER — HYDROCODONE-ACETAMINOPHEN 5-325 MG PO TABS: 1.0000 | ORAL_TABLET | ORAL | Status: DC | PRN

## 2015-09-28 MED ORDER — SODIUM CHLORIDE 0.9 % IJ SOLN
INTRAMUSCULAR | Status: AC
  Filled 2015-09-28: qty 50

## 2015-09-28 MED ORDER — HEPARIN SODIUM (PORCINE) 10000 UNIT/ML IJ SOLN
INTRAMUSCULAR | Status: AC
  Filled 2015-09-28: qty 1

## 2015-09-28 MED ORDER — CHLORHEXIDINE GLUCONATE CLOTH 2 % EX PADS: 6.0000 | MEDICATED_PAD | Freq: Once | CUTANEOUS | Status: DC

## 2015-09-28 MED ORDER — ONDANSETRON HCL 4 MG PO TABS: 4.0000 mg | ORAL_TABLET | Freq: Four times a day (QID) | ORAL | Status: DC | PRN

## 2015-09-28 MED ORDER — SEVELAMER CARBONATE 2.4 G PO PACK
2.4000 g | PACK | Freq: Three times a day (TID) | ORAL | Status: DC
  Administered 2015-09-29 – 2015-10-01 (×4): 2.4 g via ORAL
  Filled 2015-09-28 (×7): qty 1

## 2015-09-28 MED ORDER — MIDAZOLAM HCL 2 MG/2ML IJ SOLN
INTRAMUSCULAR | Status: DC | PRN
  Administered 2015-09-28: 3 mg via INTRAVENOUS

## 2015-09-28 MED ORDER — CEFUROXIME SODIUM 1.5 G IJ SOLR: 1.5000 g | INTRAMUSCULAR | Status: DC

## 2015-09-28 MED ORDER — HEPARIN (PORCINE) IN NACL 2-0.9 UNIT/ML-% IJ SOLN
INTRAMUSCULAR | Status: AC
  Filled 2015-09-28: qty 500

## 2015-09-28 MED ORDER — TORSEMIDE 20 MG PO TABS
20.0000 mg | ORAL_TABLET | Freq: Every day | ORAL | Status: DC
  Administered 2015-09-29 – 2015-10-01 (×3): 20 mg via ORAL
  Filled 2015-09-28 (×3): qty 1

## 2015-09-28 MED ORDER — ATORVASTATIN CALCIUM 20 MG PO TABS: 40.0000 mg | ORAL_TABLET | Freq: Every day | ORAL | Status: DC

## 2015-09-28 MED ORDER — PROSOL 20 % IV SOLN: 4.0000 L | INTRAVENOUS | Status: DC

## 2015-09-28 MED ORDER — MIDAZOLAM HCL 5 MG/5ML IJ SOLN
INTRAMUSCULAR | Status: AC
  Filled 2015-09-28: qty 5

## 2015-09-28 MED ORDER — DONEPEZIL HCL 5 MG PO TABS
10.0000 mg | ORAL_TABLET | Freq: Every day | ORAL | Status: DC
  Administered 2015-09-29 – 2015-09-30 (×3): 10 mg via ORAL
  Filled 2015-09-28 (×3): qty 2

## 2015-09-28 MED ORDER — FENTANYL CITRATE (PF) 100 MCG/2ML IJ SOLN
INTRAMUSCULAR | Status: AC
  Filled 2015-09-28: qty 2

## 2015-09-28 MED ORDER — HYDRALAZINE HCL 10 MG PO TABS
10.0000 mg | ORAL_TABLET | Freq: Three times a day (TID) | ORAL | Status: DC
  Administered 2015-09-29 – 2015-10-01 (×7): 10 mg via ORAL
  Filled 2015-09-28 (×8): qty 1

## 2015-09-28 MED ORDER — RENA-VITE PO TABS
1.0000 | ORAL_TABLET | Freq: Every day | ORAL | Status: DC
  Administered 2015-09-29 – 2015-10-01 (×3): 1 via ORAL
  Filled 2015-09-28 (×3): qty 1

## 2015-09-28 MED ORDER — CEFUROXIME SODIUM 1.5 G IJ SOLR
INTRAMUSCULAR | Status: AC
  Administered 2015-09-28: 12:00:00
  Filled 2015-09-28: qty 1.5

## 2015-09-28 MED ORDER — SODIUM CHLORIDE 0.9 % IV SOLN: INTRAVENOUS | Status: DC

## 2015-09-28 MED ORDER — DIAZEPAM 2 MG PO TABS: 1.0000 mg | ORAL_TABLET | ORAL | Status: DC | PRN

## 2015-09-28 MED ORDER — ACETAMINOPHEN 325 MG PO TABS: 650.0000 mg | ORAL_TABLET | Freq: Four times a day (QID) | ORAL | Status: DC | PRN

## 2015-09-28 MED ORDER — MEMANTINE HCL ER 14 MG PO CP24
28.0000 mg | ORAL_CAPSULE | Freq: Every day | ORAL | Status: DC
Start: 2015-09-29 — End: 2015-10-01
  Administered 2015-09-29 – 2015-09-30 (×2): 28 mg via ORAL
  Filled 2015-09-28 (×3): qty 2

## 2015-09-28 MED ORDER — CARVEDILOL 6.25 MG PO TABS
3.1250 mg | ORAL_TABLET | Freq: Two times a day (BID) | ORAL | Status: DC
  Administered 2015-09-29 – 2015-10-01 (×4): 3.125 mg via ORAL
  Filled 2015-09-28 (×4): qty 1

## 2015-09-28 MED ORDER — ONDANSETRON HCL 4 MG/2ML IJ SOLN: 4.0000 mg | Freq: Four times a day (QID) | INTRAMUSCULAR | Status: DC | PRN

## 2015-09-28 SURGICAL SUPPLY — 4 items
CATH DIALYSIS PAL 33 888814504 (CATHETERS) ×2 IMPLANT
PACK ANGIOGRAPHY (CUSTOM PROCEDURE TRAY) ×2 IMPLANT
TOWEL OR 17X26 4PK STRL BLUE (TOWEL DISPOSABLE) ×2 IMPLANT
WIRE AMPLATZ SS .035X180 (WIRE) ×2 IMPLANT

## 2015-09-28 NOTE — H&P (Signed)
[facility] Admission History & Physical  MRN : [id number]  Luvenia StarchJean I [name] is a 81 y.o. (05/29/1931) female who presents with chief complaint of the catheter is not working.  History of Present Illness: The patient is sent by dialysis secondary to poor flows from her catheter. She has not been receiving her full dialysis treatments for the past several runs. No history of bleeding from the catheter. There is no history of fever or chills while on dialysis.  His problem is been going on for approximately 1 week. There have been frequent episodes of catheter problems related to this patient's dialysis going on for months.  The catheter function is always the issue which is an assessment made by the dialysis center.  Current Facility-Administered Medications  Medication Dose Route Frequency Provider Last Rate Last Dose  . 0.9 %  sodium chloride infusion   Intravenous Continuous Kimberly A Stegmayer, PA-C      . 0.9 %  sodium chloride infusion   Intravenous Continuous Kimberly A Stegmayer, PA-C 10 mL/hr at 09/28/15 [phone]    . cefUROXime (ZINACEF) 1.5 g in dextrose 5 % 50 mL IVPB  1.5 g Intravenous 30 min Pre-Op Ranae PlumberKimberly A Stegmayer, PA-C      . Chlorhexidine Gluconate Cloth 2 % PADS 6 each  6 each Topical Once BlueLinxKimberly A Stegmayer, PA-C      . dextrose 5 % with cefUROXime (ZINACEF) ADS Med             Past Medical History  Diagnosis Date  . COPD (chronic obstructive pulmonary disease) (HCC)     a. on home O2  . Pneumonia 05/2013  . Mental disorder     dementia - short term memory  . History of blood transfusion     after fistula ruptured  . Dementia   . ESRD (end stage renal disease) (HCC)     a. HD MWF  . Chronic systolic CHF (congestive heart failure) (HCC)     a. echo 06/14/14: EF 20-25%, diffuse HK, AK of mid-apicalinferolateral myocardium, GRDD, PASP 44 mm Hg; b. echo 12/2014 EF 30-35%, diastolic dysfunction, global HK, unable to exclude RWMA, mod MR  . CAD  (coronary artery disease)     a. presumed CAD - patient's son has declined cath to date    Past Surgical History  Procedure Laterality Date  . Dialysis fistula creation    . Ligation of arteriovenous  fistula Left 06/04/2013    Procedure: LIGATION OF ARTERIOVENOUS  FISTULA;  Surgeon: Fransisco HertzBrian L Chen, MD;  [facility];  Service: Vascular;  Laterality: Left;  . Insertion of dialysis catheter Right 06/04/2013    Procedure: INSERTION OF DIALYSIS CATHETER;  Surgeon: Fransisco HertzBrian L Chen, MD;  [facility];  Service: Vascular;  Laterality: Right;  . Cholecystectomy  1992  . Av fistula placement Right 07/07/2013    Procedure: ARTERIOVENOUS (AV) FISTULA CREATION - RIGHT BRACHIAL CEPHALIC ;  Surgeon: Fransisco HertzBrian L Chen, MD;  [facility];  Service: Vascular;  Laterality: Right;  . Removal of a dialysis catheter Right 11/09/2013    Procedure: REMOVAL OF A DIALYSIS CATHETER- Right TDC ;  Surgeon: Fransisco HertzBrian L Chen, MD;  [facility];  Service: Vascular;  Laterality: Right;  . Insertion of dialysis catheter Left 11/09/2013    Procedure: INSERTION OF DIALYSIS CATHETER- Left TDC;  Surgeon: Fransisco HertzBrian L Chen, MD;  [facility];  Service: Vascular;  Laterality: Left;  . Exchange of a dialysis catheter Left 12/17/2013  Procedure: EXCHANGE OF A DIALYSIS CATHETER;  Surgeon: Chuck Hint, MD;  Location: [facility] OR;  Service: Vascular;  Laterality: Left;  . Fistulogram Right 12/17/2013    Procedure: FISTULOGRAM- RIGHT ARM- POSSIBLE INTERVENTION;  Surgeon: Chuck Hint, MD;  Location: [facility] OR;  Service: Vascular;  Laterality: Right;  . Ligation of competing branches of arteriovenous fistula Right 12/17/2013    Procedure: LIGATION OF COMPETING BRANCHES OF ARTERIOVENOUS FISTULA;  Surgeon: Chuck Hint, MD;  Location: [facility] OR;  Service: Vascular;  Laterality: Right;  . Ligation of arteriovenous  fistula Right 01/19/2014    Procedure: LIGATION OF ARTERIOVENOUS  FISTULA- RIGHT BRACHIOCEPHALIC FISTULA;  Surgeon: Fransisco Hertz, MD;  Location: [facility] OR;  Service: Vascular;  Laterality: Right;  . Shuntogram Right 09/10/2013    Procedure: Fistulogram;  Surgeon: Fransisco Hertz, MD;  Location[facility] LAB;  Service: Cardiovascular;  Laterality: Right;  . Peripheral vascular catheterization N/A 06/28/2015    Procedure: Dialysis/Perma Catheter Insertion;  Surgeon: Renford Dills, MD;  Location: [facility] INVASIVE CV LAB;  Service: Cardiovascular;  Laterality: N/A;    Social History Social History  Substance Use Topics  . Smoking status: Never Smoker   . Smokeless tobacco: Never Used  . Alcohol Use: No    Family History Family History  Problem Relation Age of Onset  . Family history unknown: Yes   no family history of bleeding clotting disorders, porphyria or autoimmune disorder.  Allergies  Allergen Reactions  . Sulfa Antibiotics Nausea And Vomiting and Rash  . Sulfur Nausea And Vomiting and Rash  . Aspirin Nausea And Vomiting     REVIEW OF SYSTEMS (Negative unless checked) patient is a very poor historian review of systems obtained from family  Constitutional: Weight loss  Fever  Chills Cardiac: Chest pain   Chest pressure   Palpitations   Shortness of breath when laying flat   Shortness of breath at rest   Shortness of breath with exertion. Vascular:  Pain in legs with walking   Pain in legs at rest   Pain in legs when laying flat   Claudication   Pain in feet when walking  Pain in feet at rest  Pain in feet when laying flat   History of DVT   Phlebitis   Swelling in legs   Varicose veins   Non-healing ulcers Pulmonary:   Uses home oxygen   Productive cough   Hemoptysis   Wheeze  COPD   Asthma Neurologic:  Dizziness  Blackouts   Seizures   History of stroke   History of TIA  Aphasia   Temporary blindness   Dysphagia   Weakness or numbness in arms   Weakness or numbness in legs Musculoskeletal:  Arthritis   Joint swelling   Joint  pain   Low back pain Hematologic:  Easy bruising  Easy bleeding   Hypercoagulable state   Anemic  Hepatitis Gastrointestinal:  Blood in stool   Vomiting blood  Gastroesophageal reflux/heartburn   Difficulty swallowing. Genitourinary:  Chronic kidney disease   Difficult urination  Frequent urination  Burning with urination   Blood in urine Skin:  Rashes   Ulcers   Wounds Psychological:  History of anxiety    History of major depression.  Physical Examination  Filed Vitals:   09/28/15 0934  BP: 152/81  Pulse: 72  Temp: 97.7 F (36.5 C)  TempSrc: Oral  Resp: 20  Height:  (1.6 m)  Weight: 47.174 kg (104 lb)  SpO2: 100%  Body mass index is 18.43 kg/(m^2). Gen: WD/WN, NAD Head: [place]/AT, No temporalis wasting.  Ear/Nose/Throat: Hearing grossly intact, nares w/o erythema or drainage, oropharynx w/o Erythema/Exudate, Eyes: PERRLA, EOMI.  Neck: Supple, no nuchal rigidity.  No bruit or JVD.  Pulmonary:  Good air movement, clear to auscultation bilaterally, no increased work of respiration or use of accessory muscles  Cardiac: RRR, normal S1, S2, no Murmurs, rubs or gallops. Vascular: AV graft present in the arm good thrill good bruit catheter present in the groin no redness or erythema at the exit site no drainage nontender by palpation catheter is otherwise intact Gastrointestinal: soft, non-tender/non-distended. No guarding/reflex. No masses, surgical incisions, or scars. Musculoskeletal: M/S 5/5 throughout.  No deformity or atrophy. Neurologic: CN 2-12 intact. Pain and light touch intact in extremities.  Symmetrical.  Speech is fluent. Motor exam as listed above. Psychiatric: Judgment intact, Mood & affect appropriate for pt's clinical situation. Dermatologic: No rashes or ulcers noted.  No cellulitis or open wounds. Lymph : No Cervical, Axillary, or Inguinal lymphadenopathy.   CBC Lab Results  Component Value Date   WBC 8.1 06/29/2015    HGB 9.5* 06/30/2015   HCT 32.4* 06/29/2015   MCV 90.6 06/29/2015   PLT 127* 06/29/2015    BMET    Component Value Date/Time   NA 139 06/29/2015 0345   NA 135* 12/17/2014 0509   K 4.9 06/29/2015 0345   K 4.3 12/17/2014 0509   CL 99* 06/29/2015 0345   CL 98 12/17/2014 0509   CO2 24 06/29/2015 0345   CO2 29 12/17/2014 0509   GLUCOSE 92 06/29/2015 0345   GLUCOSE 87 12/17/2014 0509   BUN 98* 06/29/2015 0345   BUN 34* 12/17/2014 0509   CREATININE 7.36* 06/29/2015 0345   CREATININE 6.62* 12/17/2014 0509   CALCIUM 8.1* 06/29/2015 0345   CALCIUM 7.7* 12/17/2014 0509   CALCIUM 7.7* 06/10/2013 0609   GFRNONAA 5* 06/29/2015 0345   GFRNONAA 6* 12/17/2014 0509   GFRNONAA 5* 06/16/2014 0406   GFRAA 5* 06/29/2015 0345   GFRAA 8* 12/17/2014 0509   GFRAA 6* 06/16/2014 0406   CrCl cannot be calculated (Patient has no serum creatinine result on file.).  COAG Lab Results  Component Value Date   INR 1.2 12/13/2014   INR 1.11 06/04/2013   INR 1.17 04/25/2013    Radiology No results found.   Assessment/Plan 1.  Complication dialysis device with poor function AV access:  Patient's femoral catheter dialysis access is not adequate to achieve proper dialysis The patient will undergo exchange of the catheter using interventional techniques.  2.  End-stage renal disease requiring hemodialysis:  Patient will continue dialysis therapy without further interruption if a successful thrombectomy is not achieved then catheter will be placed. Dialysis has already been arranged since the patient missed their previous session 3.  Hypertension:  Patient will continue medical management; nephrology is following no changes in oral medications. 4. Diabetes mellitus:  Glucose will be monitored and oral medications been held this morning once the patient has undergone the patient's procedure po intake will be reinitiated and again Accu-Cheks will be used to assess the blood glucose level and treat as needed.  The patient will be restarted on the patient's usual hypoglycemic regime 5.  Coronary artery disease:  EKG will be monitored. Nitrates will be used if needed. The patient's oral cardiac medications will be continued.     Tauni Sanks, Latina Craver, MD  09/28/2015 11:48 AM

## 2015-09-28 NOTE — ED Notes (Addendum)
Pt had a dialysis catheter placed today in right thigh.  Bleeding at site worsened tonight.  Pt to see dr Lorretta Harpschneir.

## 2015-09-28 NOTE — H&P (Signed)
[facility] Admission History & Physical  MRN : [id number]  [name] is a 81 y.o. (Jul 19, 1931) female who presents with chief complaint of  Chief Complaint  Patient presents with  . Post-op Problem  .  History of Present Illness: Patient underwent catheter exchange earlier this morning she was held in postop for several hours and had a clean dry dressing when she was discharged. I was contacted at approximately 10:00 by family and she had excessive bleeding. I requested to return to the ER for further evaluation as this is been a similar problem in the past immediately after several other catheter exchanges  Current Facility-Administered Medications  Medication Dose Route Frequency Provider Last Rate Last Dose  . acetaminophen (TYLENOL) tablet 650 mg  650 mg Oral Q6H PRN Renford Dills, MD       Or  . acetaminophen (TYLENOL) suppository 650 mg  650 mg Rectal Q6H PRN Renford Dills, MD      . acetaminophen (TYLENOL) tablet 650 mg  650 mg Oral Q6H PRN Renford Dills, MD      . Melene Muller ON 09/29/2015] atorvastatin (LIPITOR) tablet 40 mg  40 mg Oral Daily Renford Dills, MD      . Melene Muller ON 09/29/2015] carvedilol (COREG) tablet 3.125 mg  3.125 mg Oral BID WC Renford Dills, MD      . cephALEXin (KEFLEX) capsule 500 mg  500 mg Oral BID Renford Dills, MD      . Melene Muller ON 09/29/2015] clopidogrel (PLAVIX) tablet 75 mg  75 mg Oral Daily Renford Dills, MD      . diazepam (VALIUM) tablet 1 mg  1 mg Oral PRN Renford Dills, MD      . docusate sodium (COLACE) capsule 100 mg  100 mg Oral BID Renford Dills, MD      . donepezil (ARICEPT) tablet 10 mg  10 mg Oral QHS Renford Dills, MD      . hydrALAZINE (APRESOLINE) tablet 10 mg  10 mg Oral TID Renford Dills, MD      . HYDROcodone-acetaminophen (NORCO/VICODIN) 5-325 MG per tablet 1-2 tablet  1-2 tablet Oral Q4H PRN Renford Dills, MD      . Melene Muller ON 09/29/2015] isosorbide mononitrate  (IMDUR) 24 hr tablet 30 mg  30 mg Oral Daily Renford Dills, MD      . Melene Muller ON 09/29/2015] memantine (NAMENDA XR) 24 hr capsule 28 mg  28 mg Oral Daily Renford Dills, MD      . morphine 2 MG/ML injection 2 mg  2 mg Intravenous Q3H PRN Renford Dills, MD      . Melene Muller ON 09/29/2015] multivitamin (RENA-VIT) tablet 1 tablet  1 tablet Oral Daily Renford Dills, MD      . ondansetron [facility]) tablet 4 mg  4 mg Oral Q6H PRN Renford Dills, MD       Or  . ondansetron (ZOFRAN) injection 4 mg  4 mg Intravenous Q6H PRN Renford Dills, MD      . polyethylene glycol (MIRALAX / GLYCOLAX) packet 17 g  17 g Oral Daily PRN Renford Dills, MD      . PROSOL 20 % SOLN 4 L  4 L Intravenous 7 days Renford Dills, MD      . Melene Muller ON 09/29/2015] sevelamer carbonate (RENVELA) powder PACK 2.4 g  2.4 g Oral TID WC Renford Dills, MD      . [  START ON 09/29/2015] sevelamer carbonate (RENVELA) tablet 1,600 mg  1,600 mg Oral TID WC Renford Dills, MD      . Melene Muller ON 09/29/2015] torsemide (DEMADEX) tablet 20 mg  20 mg Oral Daily Renford Dills, MD       Current Outpatient Prescriptions  Medication Sig Dispense Refill  . acetaminophen (TYLENOL) 325 MG tablet Take 650 mg by mouth every 6 (six) hours as needed for mild pain.    . Amino Acid Infusion (PROSOL) 20 % SOLN Inject 4 L into the vein 7 days.    [name] atorvastatin (LIPITOR) 40 MG tablet Take 40 mg by mouth daily.     . carvedilol (COREG) 3.125 MG tablet Take 3.125 mg by mouth 2 (two) times daily with a meal.     . cephALEXin (KEFLEX) 500 MG capsule Take 1 capsule (500 mg total) by mouth 2 (two) times daily. 12 capsule 0  . clopidogrel (PLAVIX) 75 MG tablet Take 75 mg by mouth daily.     . diazepam (VALIUM) 2 MG tablet Take 1 mg by mouth as needed for anxiety or sedation.     [name] donepezil (ARICEPT) 10 MG tablet Take 10 mg by mouth at bedtime.    . hydrALAZINE (APRESOLINE) 10 MG tablet Take 10 mg by mouth 3 (three) times daily.     .  isosorbide mononitrate (IMDUR) 30 MG 24 hr tablet Take 30 mg by mouth daily.     . Memantine HCl ER (NAMENDA XR) 28 MG CP24 Take 28 mg by mouth daily.    . multivitamin (RENA-VIT) TABS tablet Take 1 tablet by mouth daily.    . sevelamer carbonate (RENVELA) 2.4 G PACK Take 2.4 g by mouth 3 (three) times daily with meals.    . sevelamer carbonate (RENVELA) 800 MG tablet Take 1,600 mg by mouth 3 (three) times daily with meals.     . torsemide (DEMADEX) 20 MG tablet Take 20 mg by mouth daily.      Past Medical History  Diagnosis Date  . COPD (chronic obstructive pulmonary disease) (HCC)     a. on home O2  . Pneumonia 05/2013  . Mental disorder     dementia - short term memory  . History of blood transfusion     after fistula ruptured  . Dementia   . ESRD (end stage renal disease) (HCC)     a. HD MWF  . Chronic systolic CHF (congestive heart failure) (HCC)     a. echo 06/14/14: EF 20-25%, diffuse HK, AK of mid-apicalinferolateral myocardium, GRDD, PASP 44 mm Hg; b. echo 12/2014 EF 30-35%, diastolic dysfunction, global HK, unable to exclude RWMA, mod MR  . CAD (coronary artery disease)     a. presumed CAD - patient's son has declined cath to date    Past Surgical History  Procedure Laterality Date  . Dialysis fistula creation    . Ligation of arteriovenous  fistula Left 06/04/2013    Procedure: LIGATION OF ARTERIOVENOUS  FISTULA;  Surgeon: Fransisco Hertz, MD;  Location: [facility] OR;  Service: Vascular;  Laterality: Left;  . Insertion of dialysis catheter Right 06/04/2013    Procedure: INSERTION OF DIALYSIS CATHETER;  Surgeon: Fransisco Hertz, MD;  Location: [facility] OR;  Service: Vascular;  Laterality: Right;  . Cholecystectomy  1992  . Av fistula placement Right 07/07/2013    Procedure: ARTERIOVENOUS (AV) FISTULA CREATION - RIGHT BRACHIAL CEPHALIC ;  Surgeon: Fransisco Hertz, MD;  Location: [facility] OR;  Service: Vascular;  Laterality: Right;  . Removal of a dialysis catheter Right 11/09/2013    Procedure: REMOVAL  OF A DIALYSIS CATHETER- Right TDC ;  Surgeon: Fransisco Hertz, MD;  Location: [facility] OR;  Service: Vascular;  Laterality: Right;  . Insertion of dialysis catheter Left 11/09/2013    Procedure: INSERTION OF DIALYSIS CATHETER- Left TDC;  Surgeon: Fransisco Hertz, MD;  Location: [facility] OR;  Service: Vascular;  Laterality: Left;  . Exchange of a dialysis catheter Left 12/17/2013    Procedure: EXCHANGE OF A DIALYSIS CATHETER;  Surgeon: Chuck Hint, MD;  Location: [facility] OR;  Service: Vascular;  Laterality: Left;  . Fistulogram Right 12/17/2013    Procedure: FISTULOGRAM- RIGHT ARM- POSSIBLE INTERVENTION;  Surgeon: Chuck Hint, MD;  Location: [facility] OR;  Service: Vascular;  Laterality: Right;  . Ligation of competing branches of arteriovenous fistula Right 12/17/2013    Procedure: LIGATION OF COMPETING BRANCHES OF ARTERIOVENOUS FISTULA;  Surgeon: Chuck Hint, MD;  Location: [facility] OR;  Service: Vascular;  Laterality: Right;  . Ligation of arteriovenous  fistula Right 01/19/2014    Procedure: LIGATION OF ARTERIOVENOUS  FISTULA- RIGHT BRACHIOCEPHALIC FISTULA;  Surgeon: Fransisco Hertz, MD;  Location: [facility] OR;  Service: Vascular;  Laterality: Right;  . Shuntogram Right 09/10/2013    Procedure: Fistulogram;  Surgeon: Fransisco Hertz, MD;  Location[facility] CATH LAB;  Service: Cardiovascular;  Laterality: Right;  . Peripheral vascular catheterization N/A 06/28/2015    Procedure: Dialysis/Perma Catheter Insertion;  Surgeon: Renford Dills, MD;  Location: [facility] INVASIVE CV LAB;  Service: Cardiovascular;  Laterality: N/A;    Social History Social History  Substance Use Topics  . Smoking status: Never Smoker   . Smokeless tobacco: Never Used  . Alcohol Use: No    Family History Family History  Problem Relation Age of Onset  . Family history unknown: Yes   no family history of porphyria or autoimmune disease or bleeding clotting disorders  Allergies  Allergen Reactions  . Sulfa Antibiotics Nausea And Vomiting and  Rash  . Sulfur Nausea And Vomiting and Rash  . Aspirin Nausea And Vomiting     REVIEW OF SYSTEMS patient has advanced dementia and is unable to respond to questions history is obtained from the grandson (Negative unless checked)  Constitutional: [] Weight loss  [] Fever  [] Chills Cardiac: [] Chest pain   [] Chest pressure   [] Palpitations   [] Shortness of breath when laying flat   [] Shortness of breath at rest   [] Shortness of breath with exertion. Vascular:  [] Pain in legs with walking   [] Pain in legs at rest   [] Pain in legs when laying flat   [] Claudication   [] Pain in feet when walking  [] Pain in feet at rest  [] Pain in feet when laying flat   [] History of DVT   [] Phlebitis   [] Swelling in legs   [] Varicose veins   [] Non-healing ulcers Pulmonary:   [] Uses home oxygen   [] Productive cough   [] Hemoptysis   [] Wheeze  [] COPD   [] Asthma Neurologic:  [] Dizziness  [] Blackouts   [] Seizures   [] History of stroke   [] History of TIA  [] Aphasia   [] Temporary blindness   [] Dysphagia   [] Weakness or numbness in arms   [] Weakness or numbness in legs Musculoskeletal:  [] Arthritis   [] Joint swelling   [] Joint pain   [] Low back pain Hematologic:  [] Easy bruising  [] Easy bleeding   [] Hypercoagulable state   [] Anemic  [] Hepatitis Gastrointestinal:  [] Blood in stool   [] Vomiting blood  []   Gastroesophageal reflux/heartburn   [] Difficulty swallowing. Genitourinary:  [] Chronic kidney disease   [] Difficult urination  [] Frequent urination  [] Burning with urination   [] Blood in urine Skin:  [] Rashes   [] Ulcers   [] Wounds Psychological:  [] History of anxiety   []  History of major depression.  Physical Examination  Filed Vitals:   09/28/15 2230 09/28/15 2247  BP: 169/133 151/76  Pulse: 80 75  Temp: 98.5 F (36.9 C)   TempSrc: Oral   Resp: 20 14  Height: 5\' 1"  (1.549 m)   Weight: 52.164 kg (115 lb)   SpO2: 100% 100%   Body mass index is 21.74 kg/(m^2). Gen: WD/WN, NAD Head: [place]/AT, No temporalis wasting.   Ear/Nose/Throat: Hearing grossly intact, nares w/o erythema or drainage, oropharynx w/o Erythema/Exudate, Eyes: PERRLA, EOMI.  Neck: Supple, no nuchal rigidity.  No bruit or JVD.  Pulmonary:  Good air movement, clear to auscultation bilaterally, no increased work of respiration or use of accessory muscles  Cardiac: RRR, normal S1, S2, no Murmurs, rubs or gallops. Vascular: Moderate-sized hematoma of the catheter site catheter itself was now filled with blood and appears to have been manipulated it is nonbleeding Gastrointestinal: soft, non-tender/non-distended. No guarding/reflex. No masses, surgical incisions, or scars. Musculoskeletal: M/S 5/5 throughout.  No deformity or atrophy. Neurologic: CN 2-12 intact. Pain and light touch intact in extremities.  Symmetrical.  Speech is minimal. Motor exam as listed above. Psychiatric: Judgment poor, Mood & affect appropriate for pt's clinical situation. Dermatologic: No rashes or ulcers noted.  No cellulitis or open wounds. Lymph : No Cervical, Axillary, or Inguinal lymphadenopathy.    CBC Lab Results  Component Value Date   WBC 8.1 06/29/2015   HGB 9.5* 06/30/2015   HCT 32.4* 06/29/2015   MCV 90.6 06/29/2015   PLT 127* 06/29/2015    BMET    Component Value Date/Time   NA 139 06/29/2015 0345   NA 135* 12/17/2014 0509   K 4.9 06/29/2015 0345   K 4.3 12/17/2014 0509   CL 99* 06/29/2015 0345   CL 98 12/17/2014 0509   CO2 24 06/29/2015 0345   CO2 29 12/17/2014 0509   GLUCOSE 92 06/29/2015 0345   GLUCOSE 87 12/17/2014 0509   BUN 98* 06/29/2015 0345   BUN 34* 12/17/2014 0509   CREATININE 7.36* 06/29/2015 0345   CREATININE 6.62* 12/17/2014 0509   CALCIUM 8.1* 06/29/2015 0345   CALCIUM 7.7* 12/17/2014 0509   CALCIUM 7.7* 06/10/2013 0609   GFRNONAA 5* 06/29/2015 0345   GFRNONAA 6* 12/17/2014 0509   GFRNONAA 5* 06/16/2014 0406   GFRAA 5* 06/29/2015 0345   GFRAA 8* 12/17/2014 0509   GFRAA 6* 06/16/2014 0406   CrCl cannot be  calculated (Patient has no serum creatinine result on file.).  COAG Lab Results  Component Value Date   INR 1.2 12/13/2014   INR 1.11 06/04/2013   INR 1.17 04/25/2013    Radiology No results found.  Assessment/Plan 1. Complication of dialysis device with bleeding. Patient has been sutured and the bleeding has been resolved the catheters been flushed. She will be observed overnight given her bleeding episode. She will be dialyzed tomorrow to ensure that the catheter is functioning properly. 2. End-stage renal disease requiring hemodialysis. Nephrology will be consult 3. Congestive heart failure appears to be compensated at this time she'll be monitored   Zayanna Pundt, Latina CraverGregory G, MD  09/28/2015 11:31 PM

## 2015-09-28 NOTE — Op Note (Signed)
OPERATIVE NOTE   PROCEDURE: 1. Replacement of tunneled dialysis catheter right femoral approach same access.  PRE-OPERATIVE DIAGNOSIS: Complication of dialysis catheter with nonfunction; end-stage renal disease requiring hemodialysis  POST-OPERATIVE DIAGNOSIS: Same  SURGEON: Schnier, Latina CraverGregory G.  ANESTHESIA: Conscious sedation with 1% lidocaine local infiltration  ESTIMATED BLOOD LOSS: Minimal cc  CONTRAST USED:  None  FLUOROSCOPY TIME:    INDICATIONS:   Deanna PingJean I Bevillis a 81 y.o. y.o. female who presents with nonfunctioning dialysis catheter.  DESCRIPTION: After obtaining full informed written consent, the patient was positioned supine. The existing right femoral tunneled dialysis catheter was prepped and draped in a sterile fashion. Small counterincision was made at the catheter insertion site. In the cuff was dissected and freed. Catheter was then withdrawn several centimeters and transected with scissors and a wire advanced through the catheter under fluoroscopic guidance without difficulty. Dilators are passed over the wire and the tunneled dialysis catheter is fed into the central venous system without difficulty.  Catheter selected was a palindrome 32 cm tipped cuff  Under fluoroscopy the catheter tip positioned at the atrial caval junction.  Both lumens aspirate and flush easily. After verification of smooth contour with proper tip position under fluoroscopy the catheter is packed with 5000 units of heparin per lumen.  Catheter secured to the skin of the right thigh with 0 silk. A sterile dressing is applied with a Biopatch.  COMPLICATIONS: None  CONDITION: Unchanged  Schnier, Latina CraverGregory G [place]. Office:  [phone]   09/28/2015,12:45 PM

## 2015-09-29 LAB — CBC
HEMATOCRIT: 27.5 % — AB (ref 35.0–47.0)
HEMOGLOBIN: 8.6 g/dL — AB (ref 12.0–16.0)
MCH: 27.7 pg (ref 26.0–34.0)
MCHC: 31.2 g/dL — AB (ref 32.0–36.0)
MCV: 88.8 fL (ref 80.0–100.0)
Platelets: 106 10*3/uL — ABNORMAL LOW (ref 150–440)
RBC: 3.09 MIL/uL — ABNORMAL LOW (ref 3.80–5.20)
RDW: 18.5 % — AB (ref 11.5–14.5)
WBC: 5.2 10*3/uL (ref 3.6–11.0)

## 2015-09-29 LAB — BASIC METABOLIC PANEL
Anion gap: 15 (ref 5–15)
BUN: 130 mg/dL — AB (ref 6–20)
CALCIUM: 7.7 mg/dL — AB (ref 8.9–10.3)
CO2: 21 mmol/L — AB (ref 22–32)
Chloride: 104 mmol/L (ref 101–111)
Creatinine, Ser: 10.06 mg/dL — ABNORMAL HIGH (ref 0.44–1.00)
GFR calc Af Amer: 4 mL/min — ABNORMAL LOW (ref >60)
GFR, EST NON AFRICAN AMERICAN: 3 mL/min — AB (ref >60)
GLUCOSE: 99 mg/dL (ref 65–99)
Potassium: 5.1 mmol/L (ref 3.5–5.1)
Sodium: 140 mmol/L (ref 135–145)

## 2015-09-29 LAB — GLUCOSE, CAPILLARY: GLUCOSE-CAPILLARY: 105 mg/dL — AB (ref 65–99)

## 2015-09-29 MED ORDER — DIAZEPAM 2 MG PO TABS: 1.0000 mg | ORAL_TABLET | Freq: Two times a day (BID) | ORAL | Status: DC | PRN

## 2015-09-29 MED ORDER — ATORVASTATIN CALCIUM 20 MG PO TABS
40.0000 mg | ORAL_TABLET | Freq: Every day | ORAL | Status: DC
  Administered 2015-09-29 – 2015-09-30 (×2): 40 mg via ORAL
  Filled 2015-09-29 (×2): qty 2

## 2015-09-29 NOTE — Progress Notes (Signed)
S: Patient more alert today eating supper denies pain did have dialysis but it was a short session catheter apparently worked well  O: Right femoral catheter clean dry and intact both lumens are clear of thrombus minimal swelling of the thigh small hematoma  A:  Occasional dialysis device with bleeding from the catheter in a patient who has not had dialysis in over a week; end-stage renal disease; dementia  P: Patient is certainly improved after a short run of dialysis I will plan for full run tomorrow and if her catheter functions well and there is no further bleeding then she will be discharged

## 2015-09-29 NOTE — Progress Notes (Signed)
Post hd tx 

## 2015-09-29 NOTE — Plan of Care (Signed)
Problem: Education: Goal: Knowledge of [facility] Education information/materials will improve Outcome: Not Progressing Pt confused  Problem: Health Behavior/Discharge Planning: Goal: Ability to manage health-related needs will improve Outcome: Not Progressing Pt confused     

## 2015-09-29 NOTE — Progress Notes (Signed)
Initial Nutrition Assessment  DOCUMENTATION CODES:      INTERVENTION:  Meals and snacks: Cater to pt preferences   NUTRITION DIAGNOSIS:    (none at this time) related to   as evidenced by  .    GOAL:   Patient will meet greater than or equal to 90% of their needs    MONITOR:    (Energy intake)  REASON FOR ASSESSMENT:    (dialysis pt)    ASSESSMENT:     Pt admitted with complication from dialysis access  Past Medical History  Diagnosis Date  . COPD (chronic obstructive pulmonary disease) (HCC)     a. on home O2  . Pneumonia 05/2013  . Mental disorder     dementia - short term memory  . History of blood transfusion     after fistula ruptured  . Dementia   . ESRD (end stage renal disease) (HCC)     a. HD MWF  . Chronic systolic CHF (congestive heart failure) (HCC)     a. echo 06/14/14: EF 20-25%, diffuse HK, AK of mid-apicalinferolateral myocardium, GRDD, PASP 44 mm Hg; b. echo 12/2014 EF 30-35%, diastolic dysfunction, global HK, unable to exclude RWMA, mod MR  . CAD (coronary artery disease)     a. presumed CAD - patient's son has declined cath to date    Current Nutrition: eating well per pt and son at bedside  Food/Nutrition-Related History: eating well prior to admission per son   Scheduled Medications:  . atorvastatin  40 mg Oral q1800  . carvedilol  3.125 mg Oral BID WC  . cephALEXin  500 mg Oral BID  . clopidogrel  75 mg Oral Daily  . docusate sodium  100 mg Oral BID  . donepezil  10 mg Oral QHS  . hydrALAZINE  10 mg Oral TID  . isosorbide mononitrate  30 mg Oral Daily  . memantine  28 mg Oral Daily  . multivitamin  1 tablet Oral Daily  . sevelamer carbonate  2.4 g Oral TID WC  . torsemide  20 mg Oral Daily          Electrolyte/Renal Profile and Glucose Profile:   Recent Labs Lab 09/29/15 0545  NA 140  K 5.1  CL 104  CO2 21*  BUN 130*  CREATININE 10.06*  CALCIUM 7.7*  GLUCOSE 99    Gastrointestinal Profile: Last BM:  unknown     Weight Change: stable wt     Diet Order:  Diet renal with fluid restriction Fluid restriction:: 1200 mL Fluid; Room service appropriate?: Yes; Fluid consistency:: Thin  Skin:   reviewed   Height:   Ht Readings from Last 1 Encounters:  09/28/15 5\' 1"  (1.549 m)    Weight:   Wt Readings from Last 1 Encounters:  09/29/15 113 lb 15.7 oz (51.7 kg)    Ideal Body Weight:     BMI:  Body mass index is 21.55 kg/(m^2).   EDUCATION NEEDS:   No education needs identified at this time  LOW Care Level  [name] B. Freida BusmanAllen, RD, LDN [phone] (pager)

## 2015-09-29 NOTE — Progress Notes (Signed)
Pre-hd tx 

## 2015-09-29 NOTE — Care Management Note (Signed)
[facility]  I will update clinic at discharge with current medical records.  Ivor ReiningKim Calan Doren  Dialysis Liaison  [phone]

## 2015-09-29 NOTE — Progress Notes (Signed)
[facility]  ROUNDING NOTE   Subjective:   Patient has not had dialysis since last Wednesday (11/2) Patient had right femoral permcath exchanged yesterday by Dr. Gilda Crease. Unfortunately, patient started having bleeding from catheter site and brought back to ED and admitted.   Objective:  Vital signs in last 24 hours:  Temp:  [97.6 F (36.4 C)-98.5 F (36.9 C)] 97.6 F (36.4 C) (11/10 0815) Pulse Rate:  [72-84] 72 (11/10 0815) Resp:  [14-22] 18 (11/10 0815) BP: (140-169)/(58-133) 142/58 mmHg (11/10 0815) SpO2:  [94 %-100 %] 100 % (11/10 0508) Weight:  [50.712 kg (111 lb 12.8 oz)-52.164 kg (115 lb)] 51.71 kg (114 lb) (11/10 0515)  Weight change:  Filed Weights   09/28/15 2230 09/29/15 0024 09/29/15 0515  Weight: 52.164 kg (115 lb) 50.712 kg (111 lb 12.8 oz) 51.71 kg (114 lb)    Intake/Output: I/O last 3 completed shifts: In: 120 [P.O.:120] Out: 0    Intake/Output this shift:     Physical Exam: General: NAD, somnulent  Head: Normocephalic, atraumatic.   Eyes: Eyes closed  Neck: Supple, trachea midline  Lungs:  Clear to auscultation  Heart: Regular rate and rhythm  Abdomen:  Soft, nontender,   Extremities:  trace peripheral edema.  Neurologic: Nonfocal, moving all four extremities  Skin: No lesions  Access: Right femoral permcath 11/10 Dr. Gilda Crease, Right AVG    Basic Metabolic Panel:  Recent Labs Lab 09/29/15 0545  NA 140  K 5.1  CL 104  CO2 21*  GLUCOSE 99  BUN 130*  CREATININE 10.06*  CALCIUM 7.7*    Liver Function Tests: No results for input(s): AST, ALT, ALKPHOS, BILITOT, PROT, ALBUMIN in the last 168 hours. No results for input(s): LIPASE, AMYLASE in the last 168 hours. No results for input(s): AMMONIA in the last 168 hours.  CBC:  Recent Labs Lab 09/29/15 0545  WBC 5.2  HGB 8.6*  HCT 27.5*  MCV 88.8  PLT 106*    Cardiac Enzymes: No results for input(s): CKTOTAL, CKMB, CKMBINDEX, TROPONINI in the last 168  hours.  BNP: Invalid input(s): POCBNP  CBG:  Recent Labs Lab 09/29/15 0504  GLUCAP 105*    Microbiology: Results for orders placed or performed in visit on 12/13/14  Culture, blood (single)     Status: None   Collection Time: 12/13/14  1:39 PM  Result Value Ref Range Status   Micro Text Report   Final       ORGANISM 1                Escherichia coli   COMMENT                   IN AEROBIC BOTTLE ONLY   GRAM STAIN                GRAM NEGATIVE ROD   ANTIBIOTIC                    ORG#1     AMPICILLIN                    S         CEFAZOLIN                     S         CEFOXITIN                     S  CEFTAZIDIME                   S         CEFTRIAXONE                   S         CIPROFLOXACIN                 S         GENTAMICIN                    S         IMIPENEM                      S         LEVOFLOXACIN                  S           Culture, blood (single)     Status: None   Collection Time: 12/13/14  1:40 PM  Result Value Ref Range Status   Micro Text Report   Final       SOURCE: END    ORGANISM 1                Escherichia coli   ORGANISM 2                -   COMMENT                   IN AEROBIC AND ANAEROBIC BOTTLES   COMMENT                   -   GRAM STAIN                GRAM NEGATIVE ROD   GRAM STAIN                GRAM NEGATIVE ROD   ANTIBIOTIC                    ORG#1     AMPICILLIN                    S         CEFAZOLIN                     S         CEFOXITIN                     S         CEFTAZIDIME                   S         CEFTRIAXONE                   S         CIPROFLOXACIN                 S         GENTAMICIN                    S         IMIPENEM                      S  LEVOFLOXACIN                  S           Clostridium Difficile [facility])     Status: None   Collection Time: 12/13/14  8:29 PM  Result Value Ref Range Status   Micro Text Report   Final       C.DIFFICILE ANTIGEN       C.DIFFICILE GDH ANTIGEN : POSITIVE    C.DIFFICILE TOXIN A/B     C.DIFFICILE TOXINS A AND B : NEGATIVE   PCR FOR TOXIGENIC C.DIFF  PCR FOR TOXIGENIC C.DIFFICILE : NEGATIVE   INTERPRETATION            Negative for toxigenic  C. difficile. Toxin gene and active toxin production not detected. May be a nontoxigenic strain of C. difficile bacteria present, lacking the ability to produce toxin.    ANTIBIOTIC                                                      Culture, blood (single)     Status: None   Collection Time: 12/15/14  5:40 PM  Result Value Ref Range Status   Micro Text Report   Final       COMMENT                   NO GROWTH AEROBICALLY/ANAEROBICALLY IN 5 DAYS   ANTIBIOTIC                                                      Culture, blood (single)     Status: None   Collection Time: 12/15/14  5:50 PM  Result Value Ref Range Status   Micro Text Report   Final       COMMENT                   NO GROWTH AEROBICALLY/ANAEROBICALLY IN 5 DAYS   ANTIBIOTIC                                                        Coagulation Studies: No results for input(s): LABPROT, INR in the last 72 hours.  Urinalysis: No results for input(s): COLORURINE, LABSPEC, PHURINE, GLUCOSEU, HGBUR, BILIRUBINUR, KETONESUR, PROTEINUR, UROBILINOGEN, NITRITE, LEUKOCYTESUR in the last 72 hours.  Invalid input(s): APPERANCEUR    Imaging: No results found.   Medications:     . atorvastatin  40 mg Oral q1800  . carvedilol  3.125 mg Oral BID WC  . cephALEXin  500 mg Oral BID  . clopidogrel  75 mg Oral Daily  . docusate sodium  100 mg Oral BID  . donepezil  10 mg Oral QHS  . hydrALAZINE  10 mg Oral TID  . isosorbide mononitrate  30 mg Oral Daily  . memantine  28 mg Oral Daily  . multivitamin  1 tablet Oral Daily  . PROSOL  4 L Intravenous 7 days  . sevelamer carbonate  2.4 g Oral  TID WC  . torsemide  20 mg Oral Daily   acetaminophen **OR** acetaminophen, acetaminophen, diazepam, HYDROcodone-acetaminophen, morphine injection,  ondansetron **OR** ondansetron (ZOFRAN) IV, polyethylene glycol  Assessment/ Plan:  Luvenia StarchJean I Varano is a 81 y.o. year old white female with ESRD  including COPD, dementia, chronic systolic congestive heart failure with EF 30-35%, coronary disease  [facility]  1. ESRD: last dialysis was over 1 week ago. Plan on short dialysis today and then resume MWF schedule tomorrow. Orders prepared.  Complication of dialysis device: will clean blood around catheter and monitor for more bleeding.   2. Anemia of chronic kidney disease: hemoglobin 8.6 - IV epo for tomorrow's treatment.  3. Secondary hyperparathyroidism - Currently not on any binder. We will continue to monitor her phosphorus  4. Hypertension: well controlled.  - carvedilol, hydralazine, imdur, torsemide.    LOS:  Itzabella Sorrels 11/10/201610:51 AM

## 2015-09-29 NOTE — Progress Notes (Signed)
HD tx completed.

## 2015-09-29 NOTE — Progress Notes (Signed)
HD tx start 

## 2015-09-30 LAB — CBC
HCT: 24.8 % — ABNORMAL LOW (ref 35.0–47.0)
Hemoglobin: 7.8 g/dL — ABNORMAL LOW (ref 12.0–16.0)
MCH: 27.6 pg (ref 26.0–34.0)
MCHC: 31.5 g/dL — ABNORMAL LOW (ref 32.0–36.0)
MCV: 87.7 fL (ref 80.0–100.0)
Platelets: 111 K/uL — ABNORMAL LOW (ref 150–440)
RBC: 2.83 MIL/uL — ABNORMAL LOW (ref 3.80–5.20)
RDW: 18.4 % — ABNORMAL HIGH (ref 11.5–14.5)
WBC: 7.2 K/uL (ref 3.6–11.0)

## 2015-09-30 LAB — HEPATITIS B SURFACE ANTIBODY, QUANTITATIVE: Hep B S AB Quant (Post): 11.6 m[IU]/mL (ref >9.9)

## 2015-09-30 LAB — RENAL FUNCTION PANEL
Albumin: 2.6 g/dL — ABNORMAL LOW (ref 3.5–5.0)
Anion gap: 8 (ref 5–15)
BUN: 64 mg/dL — ABNORMAL HIGH (ref 6–20)
CO2: 29 mmol/L (ref 22–32)
Calcium: 7.8 mg/dL — ABNORMAL LOW (ref 8.9–10.3)
Chloride: 100 mmol/L — ABNORMAL LOW (ref 101–111)
Creatinine, Ser: 6.47 mg/dL — ABNORMAL HIGH (ref 0.44–1.00)
GFR calc Af Amer: 6 mL/min — ABNORMAL LOW (ref >60)
GFR calc non Af Amer: 5 mL/min — ABNORMAL LOW (ref >60)
Glucose, Bld: 118 mg/dL — ABNORMAL HIGH (ref 65–99)
Phosphorus: 7.3 mg/dL — ABNORMAL HIGH (ref 2.5–4.6)
Potassium: 4 mmol/L (ref 3.5–5.1)
Sodium: 137 mmol/L (ref 135–145)

## 2015-09-30 LAB — HEPATITIS B SURFACE ANTIGEN: HEP B S AG: NEGATIVE

## 2015-09-30 MED ORDER — EPOETIN ALFA 10000 UNIT/ML IJ SOLN
10000.0000 [IU] | Freq: Once | INTRAMUSCULAR | Status: AC
  Administered 2015-09-30: 10000 [IU] via INTRAVENOUS

## 2015-09-30 NOTE — Care Management (Signed)
Patient was up for discharge today continued bleeding from dialysis cath site. Discharge held.

## 2015-09-30 NOTE — Plan of Care (Signed)
Problem: Education: Goal: Knowledge of [facility] Education information/materials will improve Outcome: Not Progressing Pt confused  Problem: Health Behavior/Discharge Planning: Goal: Ability to manage health-related needs will improve Outcome: Not Progressing Pt confused  Problem: Consults Goal: Chronic Renal Failure Patient Education See Patient Education Module for education specifics.  Outcome: Not Progressing Pt confused

## 2015-09-30 NOTE — Progress Notes (Signed)
[facility]  ROUNDING NOTE   Subjective:   Seen on hemodialysis, tolerating treatment well. BFR 350, unable to achieve 400 UF 0.5.  Treatment yesterday and then again today. Plan for discharge today  Objective:  Vital signs in last 24 hours:  Temp:  [97.8 F (36.6 C)-99.2 F (37.3 C)] 99.2 F (37.3 C) (11/11 0940) Pulse Rate:  [69-84] 69 (11/11 1100) Resp:  [10-26] 26 (11/11 1100) BP: (104-159)/(52-73) 124/72 mmHg (11/11 1100) SpO2:  [95 %-100 %] 95 % (11/11 0550) Weight:  [51.5 kg (113 lb 8.6 oz)-52.436 kg (115 lb 9.6 oz)] 52 kg (114 lb 10.2 oz) (11/11 0940)  Weight change: -0.664 kg (-1 lb 7.4 oz) Filed Weights   09/29/15 1447 09/30/15 0500 09/30/15 0940  Weight: 51.7 kg (113 lb 15.7 oz) 52.436 kg (115 lb 9.6 oz) 52 kg (114 lb 10.2 oz)    Intake/Output: I/O last 3 completed shifts: In: 360 [P.O.:360] Out: 0    Intake/Output this shift:     Physical Exam: General: NAD, somnulent  Head: Normocephalic, atraumatic.   Eyes: Eyes closed  Neck: Supple, trachea midline  Lungs:  Clear to auscultation  Heart: Regular rate and rhythm  Abdomen:  Soft, nontender,   Extremities:  trace peripheral edema.  Neurologic: Nonfocal, moving all four extremities  Skin: No lesions  Access: Right femoral permcath 11/10 Dr. Gilda Crease, Right AVG- hero    Basic Metabolic Panel:  Recent Labs Lab 09/29/15 0545 09/30/15 0940  NA 140 137  K 5.1 4.0  CL 104 100*  CO2 21* 29  GLUCOSE 99 118*  BUN 130* 64*  CREATININE 10.06* 6.47*  CALCIUM 7.7* 7.8*  PHOS  --  7.3*    Liver Function Tests:  Recent Labs Lab 09/30/15 0940  ALBUMIN 2.6*   No results for input(s): LIPASE, AMYLASE in the last 168 hours. No results for input(s): AMMONIA in the last 168 hours.  CBC:  Recent Labs Lab 09/29/15 0545 09/30/15 0940  WBC 5.2 7.2  HGB 8.6* 7.8*  HCT 27.5* 24.8*  MCV 88.8 87.7  PLT 106* 111*    Cardiac Enzymes: No results for input(s): CKTOTAL, CKMB, CKMBINDEX,  TROPONINI in the last 168 hours.  BNP: Invalid input(s): POCBNP  CBG:  Recent Labs Lab 09/29/15 0504  GLUCAP 105*    Microbiology: Results for orders placed or performed in visit on 12/13/14  Culture, blood (single)     Status: None   Collection Time: 12/13/14  1:39 PM  Result Value Ref Range Status   Micro Text Report   Final       ORGANISM 1                Escherichia coli   COMMENT                   IN AEROBIC BOTTLE ONLY   GRAM STAIN                GRAM NEGATIVE ROD   ANTIBIOTIC                    ORG#1     AMPICILLIN                    S         CEFAZOLIN                     S         CEFOXITIN  S         CEFTAZIDIME                   S         CEFTRIAXONE                   S         CIPROFLOXACIN                 S         GENTAMICIN                    S         IMIPENEM                      S         LEVOFLOXACIN                  S           Culture, blood (single)     Status: None   Collection Time: 12/13/14  1:40 PM  Result Value Ref Range Status   Micro Text Report   Final       SOURCE: END    ORGANISM 1                Escherichia coli   ORGANISM 2                -   COMMENT                   IN AEROBIC AND ANAEROBIC BOTTLES   COMMENT                   -   GRAM STAIN                GRAM NEGATIVE ROD   GRAM STAIN                GRAM NEGATIVE ROD   ANTIBIOTIC                    ORG#1     AMPICILLIN                    S         CEFAZOLIN                     S         CEFOXITIN                     S         CEFTAZIDIME                   S         CEFTRIAXONE                   S         CIPROFLOXACIN                 S         GENTAMICIN                    S         IMIPENEM  S         LEVOFLOXACIN                  S           Clostridium Difficile [facility]     Status: None   Collection Time: 12/13/14  8:29 PM  Result Value Ref Range Status   Micro Text Report   Final       C.DIFFICILE ANTIGEN       C.DIFFICILE  GDH ANTIGEN : POSITIVE   C.DIFFICILE TOXIN A/B     C.DIFFICILE TOXINS A AND B : NEGATIVE   PCR FOR TOXIGENIC C.DIFF  PCR FOR TOXIGENIC C.DIFFICILE : NEGATIVE   INTERPRETATION            Negative for toxigenic  C. difficile. Toxin gene and active toxin production not detected. May be a nontoxigenic strain of C. difficile bacteria present, lacking the ability to produce toxin.    ANTIBIOTIC                                                      Culture, blood (single)     Status: None   Collection Time: 12/15/14  5:40 PM  Result Value Ref Range Status   Micro Text Report   Final       COMMENT                   NO GROWTH AEROBICALLY/ANAEROBICALLY IN 5 DAYS   ANTIBIOTIC                                                      Culture, blood (single)     Status: None   Collection Time: 12/15/14  5:50 PM  Result Value Ref Range Status   Micro Text Report   Final       COMMENT                   NO GROWTH AEROBICALLY/ANAEROBICALLY IN 5 DAYS   ANTIBIOTIC                                                        Coagulation Studies: No results for input(s): LABPROT, INR in the last 72 hours.  Urinalysis: No results for input(s): COLORURINE, LABSPEC, PHURINE, GLUCOSEU, HGBUR, BILIRUBINUR, KETONESUR, PROTEINUR, UROBILINOGEN, NITRITE, LEUKOCYTESUR in the last 72 hours.  Invalid input(s): APPERANCEUR    Imaging: No results found.   Medications:     . atorvastatin  40 mg Oral q1800  . carvedilol  3.125 mg Oral BID WC  . cephALEXin  500 mg Oral BID  . clopidogrel  75 mg Oral Daily  . docusate sodium  100 mg Oral BID  . donepezil  10 mg Oral QHS  . hydrALAZINE  10 mg Oral TID  . isosorbide mononitrate  30 mg Oral Daily  . memantine  28 mg Oral Daily  . multivitamin  1 tablet Oral Daily  . sevelamer carbonate  2.4 g Oral  TID WC  . torsemide  20 mg Oral Daily   acetaminophen **OR** acetaminophen, acetaminophen, diazepam, HYDROcodone-acetaminophen, morphine injection, ondansetron **OR**  ondansetron (ZOFRAN) IV, polyethylene glycol  Assessment/ Plan:  [name] is a 81 y.o. year old white female with ESRD  including COPD, dementia, chronic systolic congestive heart failure with EF 30-35%, coronary disease  MWF [facility]  1. ESRD: tolerating treatment well. Continue MWF schedule.   2. Anemia of chronic kidney disease: hemoglobin 7.8 - IV epo with treatment.   3. Secondary hyperparathyroidism - Currently not on any binder. We will continue to monitor her phosphorus  4. Hypertension: well controlled.  - carvedilol, hydralazine, imdur, torsemide.    LOS:  Melyna Huron 11/11/201611:18 AM

## 2015-09-30 NOTE — Progress Notes (Signed)
S: Patient more alert had dialysis today catheter apparently worked well  Post dialysis again had bleeding   O: Right femoral catheter clean dry and intact at the time of my evaluation (Nurse changed the bloody dressing about 90 minutes ago and it is perfect) both lumens are clear of thrombus minimal swelling of the thigh small hematoma  A:  Complication of dialysis device with bleeding from the catheter in a patient who has had dialysis today; end-stage renal disease; dementia  P: Patient is certainly improved after a short run of dialysis I can't DC her at this point with the recent bleeding  However, I was prepared to pull the catheter out but now that it is not bleeding for over one hour that seems imprudent as further access is very limited.  I have added an Elastoplast compression dressing on top and if the cath remains dry then she can go tomorrow  I have had a 40 minute discussion with the Son regarding using her arm access and the now very real limitation of catheters.  He voices understanding.  I have recommended that he arrange a meeting with the center and her nephrologist to come up with a better plan than just continuing catheter based access

## 2015-09-30 NOTE — Progress Notes (Signed)
Informed by family that pt dialysis catheter was bleeding; upon assessment, found dressing saturated with new blood; informed Dr. Gilda CreaseSchnier of this, and Dr ordered to cancel pt discharge; family notified

## 2015-09-30 NOTE — Care Management (Signed)
From home with 24 hour sitters. Spoke with Jonny RuizJohn the son who was at the bedside. Patient has had [facility] in the past. Son stated that patient does well at home and that he lives next door as well. Has walker but does not use it. Hopeful for discharge tomorrow if cath OK. No needs anticipated at this time. Continue to follow.

## 2015-09-30 NOTE — Discharge Instructions (Signed)
Follow all MD discharge instructions. Take all medications as prescribed. Keep all follow up appointments. If your symptoms return, call your doctor. If you experience any new symptoms that are of concern to you or that are bothersome to you, call your doctor. For all questions and/or concerns, call your doctor. ° ° If you have a medical emergency, call 911 ° °If you experience any bleeding or discharge from your incision sites, redness or swelling at your incision sites, fever, pain uncontrolled by medication, increase in pain, or any nausea, call your doctor.  °

## 2015-09-30 NOTE — Progress Notes (Signed)
Paged Dr Gilda CreaseSchnier to inform that I had changed dressing and that site was still actively bleeding; inquired about plan of care for pt; Dr stated he would come to see pt later this evening; family notified

## 2015-10-01 NOTE — Care Management Note (Signed)
Case Management Note  Patient Details  Name: [name] MRN: [id number] Date of Birth: 04/19/1931  Subjective/Objective:   No HH orders. Has 24/7 sitters at home and son lives next door. Has a walker at home.                  Action/Plan:   Expected Discharge Date:                  Expected Discharge Plan:     In-House Referral:     Discharge planning Services     Post Acute Care Choice:    Choice offered to:     DME Arranged:    DME Agency:     HH Arranged:    HH Agency:     Status of Service:     Medicare Important Message Given:    Date Medicare IM Given:    Medicare IM give by:    Date Additional Medicare IM Given:    Additional Medicare Important Message give by:     If discussed at Long Length of Stay Meetings, dates discussed:    Additional Comments:  Amneet Cendejas A, RN 10/01/2015, 10:06 AM

## 2015-10-01 NOTE — Progress Notes (Signed)
    Subjective  -   Resting comfortably No bleeding issues overnight   Physical Exam:  Groin catheter site clean and dry with no evidence of bleeding       Assessment/Plan:    Plan for d/c home  [name], Wells 10/01/2015 9:50 AM --  Filed Vitals:   10/01/15 0517  BP: 149/64  Pulse: 69  Temp: 97.7 F (36.5 C)  Resp:     Intake/Output Summary (Last 24 hours) at 10/01/15 0950 Last data filed at 10/01/15 0700  Gross per 24 hour  Intake      0 ml  Output    500 ml  Net   -500 ml     Laboratory CBC    Component Value Date/Time   WBC 7.2 09/30/2015 0940   WBC 6.5 12/17/2014 0509   HGB 7.8* 09/30/2015 0940   HGB 7.2* 12/17/2014 0509   HCT 24.8* 09/30/2015 0940   HCT 22.8* 12/17/2014 0509   PLT 111* 09/30/2015 0940   PLT 169 12/17/2014 0509    BMET    Component Value Date/Time   NA 137 09/30/2015 0940   NA 135* 12/17/2014 0509   K 4.0 09/30/2015 0940   K 4.3 12/17/2014 0509   CL 100* 09/30/2015 0940   CL 98 12/17/2014 0509   CO2 29 09/30/2015 0940   CO2 29 12/17/2014 0509   GLUCOSE 118* 09/30/2015 0940   GLUCOSE 87 12/17/2014 0509   BUN 64* 09/30/2015 0940   BUN 34* 12/17/2014 0509   CREATININE 6.47* 09/30/2015 0940   CREATININE 6.62* 12/17/2014 0509   CALCIUM 7.8* 09/30/2015 0940   CALCIUM 7.7* 12/17/2014 0509   CALCIUM 7.7* 06/10/2013 0609   GFRNONAA 5* 09/30/2015 0940   GFRNONAA 6* 12/17/2014 0509   GFRNONAA 5* 06/16/2014 0406   GFRAA 6* 09/30/2015 0940   GFRAA 8* 12/17/2014 0509   GFRAA 6* 06/16/2014 0406    COAG Lab Results  Component Value Date   INR 1.2 12/13/2014   INR 1.11 06/04/2013   INR 1.17 04/25/2013   No results found for: PTT  Antibiotics Anti-infectives    Start     Dose/Rate Route Frequency Ordered Stop   09/28/15 2345  cephALEXin (KEFLEX) capsule 500 mg     500 mg Oral 2 times daily 09/28/15 2330         V. Charlena CrossWells Genasis Zingale IV, M.D. [facility] Office: [phone] Pager:   [phone]

## 2015-10-05 ENCOUNTER — Encounter: Payer: Self-pay | Admitting: Vascular Surgery

## 2015-11-06 NOTE — Discharge Summary (Signed)
[facility]    Discharge Summary    Patient ID:  [name] MRN: [id number] DOB/AGE: 1931-02-26 81 y.o.  Admit date: 09/28/2015 Discharge date: 11/06/2015 Date of Surgery: * No surgery found * Surgeon:   Admission Diagnosis: post surgery bleeding  Discharge Diagnoses:  post surgery bleeding  Secondary Diagnoses: Past Medical History  Diagnosis Date  . COPD (chronic obstructive pulmonary disease) (HCC)     a. on home O2  . Pneumonia 05/2013  . Mental disorder     dementia - short term memory  . History of blood transfusion     after fistula ruptured  . Dementia   . ESRD (end stage renal disease) (HCC)     a. HD MWF  . Chronic systolic CHF (congestive heart failure) (HCC)     a. echo 06/14/14: EF 20-25%, diffuse HK, AK of mid-apicalinferolateral myocardium, GRDD, PASP 44 mm Hg; b. echo 12/2014 EF 30-35%, diastolic dysfunction, global HK, unable to exclude RWMA, mod MR  . CAD (coronary artery disease)     a. presumed CAD - patient's son has declined cath to date      Discharged Condition: good  HPI:  Patient is admitted secondary to bleeding from the permacath site she recently underwent an exchange of a permacath secondary to nonfunction of the existing permacath.  Hospital Course:  [name] is a 81 y.o. female is S/P Neither  Extubated: POD # Patient not intubated Physical exam: Permacath right groin Post-op wounds clean, dry, intact or healing well Pt. Ambulating, voiding and taking PO diet without difficulty. Pt pain controlled with PO pain meds. Labs as below Complications: Bleeding from permacath  Consults:  Treatment Team:  Lamont Dowdy, MD  Significant Diagnostic Studies: CBC Lab Results  Component Value Date   WBC 7.2 09/30/2015   HGB 7.8* 09/30/2015   HCT 24.8* 09/30/2015   MCV 87.7 09/30/2015   PLT 111* 09/30/2015    BMET    Component Value Date/Time   NA 137 09/30/2015 0940   NA 135* 12/17/2014 0509   K 4.0 09/30/2015 0940   K 4.3 12/17/2014 0509   CL 100* 09/30/2015 0940   CL 98 12/17/2014 0509   CO2 29 09/30/2015 0940   CO2 29 12/17/2014 0509   GLUCOSE 118* 09/30/2015 0940   GLUCOSE 87 12/17/2014 0509   BUN 64* 09/30/2015 0940   BUN 34* 12/17/2014 0509   CREATININE 6.47* 09/30/2015 0940   CREATININE 6.62* 12/17/2014 0509   CALCIUM 7.8* 09/30/2015 0940   CALCIUM 7.7* 12/17/2014 0509   CALCIUM 7.7* 06/10/2013 0609   GFRNONAA 5* 09/30/2015 0940   GFRNONAA 6* 12/17/2014 0509   GFRNONAA 5* 06/16/2014 0406   GFRAA 6* 09/30/2015 0940   GFRAA 8* 12/17/2014 0509   GFRAA 6* 06/16/2014 0406   COAG Lab Results  Component Value Date   INR 1.2 12/13/2014   INR 1.11 06/04/2013   INR 1.17 04/25/2013     Disposition:  Discharge to :Home Discharge Instructions    Call MD for:  redness, tenderness, or signs of infection (pain, swelling, bleeding, redness, odor or green/yellow discharge around incision site)    Complete by:  As directed      Call MD for:  severe or increased pain, loss or decreased feeling  in affected limb(s)    Complete by:  As directed      Call MD for:  temperature >100.5    Complete by:  As directed  Resume previous diet    Complete by:  As directed             Medication List    STOP taking these medications        cephALEXin 500 MG capsule  Commonly known as:  KEFLEX      TAKE these medications        acetaminophen 325 MG tablet  Commonly known as:  TYLENOL  Take 650 mg by mouth every 6 (six) hours as needed for mild pain.     atorvastatin 40 MG tablet  Commonly known as:  LIPITOR  Take 40 mg by mouth daily.     carvedilol 3.125 MG tablet  Commonly known as:  COREG  Take 3.125 mg by mouth 2 (two) times daily with a meal.     clopidogrel 75 MG tablet  Commonly known as:  PLAVIX  Take 75 mg by mouth daily.     diazepam 2 MG tablet  Commonly known as:  VALIUM  Take 1 mg by mouth as needed for anxiety or sedation.     donepezil 10  MG tablet  Commonly known as:  ARICEPT  Take 10 mg by mouth at bedtime.     hydrALAZINE 10 MG tablet  Commonly known as:  APRESOLINE  Take 10 mg by mouth 3 (three) times daily.     isosorbide mononitrate 30 MG 24 hr tablet  Commonly known as:  IMDUR  Take 30 mg by mouth daily.     multivitamin Tabs tablet  Take 1 tablet by mouth daily.     NAMENDA XR 28 MG Cp24 24 hr capsule  Generic drug:  memantine  Take 28 mg by mouth daily.     PROSOL 20 % Soln  Inject 4 L into the vein 7 days.     sevelamer carbonate 2.4 G Pack  Commonly known as:  RENVELA  Take 2.4 g by mouth 3 (three) times daily with meals.     sevelamer carbonate 800 MG tablet  Commonly known as:  RENVELA  Take 1,600 mg by mouth 3 (three) times daily with meals.     torsemide 20 MG tablet  Commonly known as:  DEMADEX  Take 20 mg by mouth daily.       Verbal and written Discharge instructions given to the patient. Wound care per Discharge AVS     Follow-up Information    Go to Schnier, Latina CraverGregory G, MD.   Specialties:  Vascular Surgery, Cardiology, Radiology, Vascular Surgery   Why:  As needed   Contact information:   [address] [place] [address] [phone]       Follow up with HAWKINS,EDWARD L, MD. Go in 1 week.   Specialty:  Pulmonary Disease   Why:  Regular post-hospitalization follow up; Please call on Monday to make a appointment.   Contact information:   [address] [place] [address] [phone]       Signed: Renford DillsSchnier, Gregory G, MD  11/06/2015, 12:33 PM

## 2015-12-20 ENCOUNTER — Ambulatory Visit: Payer: Medicare HMO | Admitting: Cardiovascular Disease

## 2015-12-27 ENCOUNTER — Ambulatory Visit (INDEPENDENT_AMBULATORY_CARE_PROVIDER_SITE_OTHER): Payer: Medicare HMO | Admitting: Nurse Practitioner

## 2015-12-27 ENCOUNTER — Encounter: Payer: Self-pay | Admitting: Nurse Practitioner

## 2015-12-27 VITALS — BP 102/70 | HR 84 | Ht 63.0 in | Wt 106.8 lb

## 2015-12-27 DIAGNOSIS — J449 Chronic obstructive pulmonary disease, unspecified: Secondary | ICD-10-CM | POA: Diagnosis not present

## 2015-12-27 DIAGNOSIS — I110 Hypertensive heart disease with heart failure: Secondary | ICD-10-CM

## 2015-12-27 DIAGNOSIS — I2510 Atherosclerotic heart disease of native coronary artery without angina pectoris: Secondary | ICD-10-CM | POA: Diagnosis not present

## 2015-12-27 DIAGNOSIS — I5022 Chronic systolic (congestive) heart failure: Secondary | ICD-10-CM | POA: Diagnosis not present

## 2015-12-27 DIAGNOSIS — N186 End stage renal disease: Secondary | ICD-10-CM | POA: Diagnosis not present

## 2015-12-27 NOTE — Progress Notes (Signed)
Office Visit    Patient Name: [name] Date of Encounter: 12/27/2015  Primary Care Provider:  Fredirick Maudlin, MD Primary Cardiologist:  Concha Se, MD   Chief Complaint    82 year old female with a history of systolic heart failure and end-stage renal disease, who presents for follow-up.  Past Medical History    Past Medical History  Diagnosis Date  . COPD (chronic obstructive pulmonary disease) (HCC)     a. on home O2  . History of pneumonia 05/2013  . History of blood transfusion     a. after fistula ruptured  . Dementia   . ESRD (end stage renal disease) (HCC)     a. HD MWF; b. multiple fistula revisions now using permacath.  . Chronic systolic CHF (congestive heart failure) (HCC)     a. echo 06/14/14: EF 20-25%, diffuse HK, AK of mid-apicalinferolateral myocardium, DD, PASP 44 mm Hg; b. echo 11/2014 EF 30-35%, diastolic dysfunction, global HK, unable to exclude RWMA, mod MR; c. 01/2015 Echo: EF 45-50%, mild diff HK, gr1 DD, triv AI, mild MR, mildly dil LA, nl RV/PASP.  [name] Presumed CAD     a. presumed CAD - patient's son has declined cath/ischemic eval to date  . Secondary hyperparathyroidism [facility])    Past Surgical History  Procedure Laterality Date  . Dialysis fistula creation    . Ligation of arteriovenous  fistula Left 06/04/2013    Procedure: LIGATION OF ARTERIOVENOUS  FISTULA;  Surgeon: Fransisco Hertz, MD;  Location: [facility] OR;  Service: Vascular;  Laterality: Left;  . Insertion of dialysis catheter Right 06/04/2013    Procedure: INSERTION OF DIALYSIS CATHETER;  Surgeon: Fransisco Hertz, MD;  Location: [facility] OR;  Service: Vascular;  Laterality: Right;  . Cholecystectomy  1992  . Av fistula placement Right 07/07/2013    Procedure: ARTERIOVENOUS (AV) FISTULA CREATION - RIGHT BRACHIAL CEPHALIC ;  Surgeon: Fransisco Hertz, MD;  Location: [facility] OR;  Service: Vascular;  Laterality: Right;  . Removal of a dialysis catheter Right 11/09/2013    Procedure: REMOVAL OF A DIALYSIS CATHETER- Right  TDC ;  Surgeon: Fransisco Hertz, MD;  Location: [facility] OR;  Service: Vascular;  Laterality: Right;  . Insertion of dialysis catheter Left 11/09/2013    Procedure: INSERTION OF DIALYSIS CATHETER- Left TDC;  Surgeon: Fransisco Hertz, MD;  Location: [facility] OR;  Service: Vascular;  Laterality: Left;  . Exchange of a dialysis catheter Left 12/17/2013    Procedure: EXCHANGE OF A DIALYSIS CATHETER;  Surgeon: Chuck Hint, MD;  Location: [facility] OR;  Service: Vascular;  Laterality: Left;  . Fistulogram Right 12/17/2013    Procedure: FISTULOGRAM- RIGHT ARM- POSSIBLE INTERVENTION;  Surgeon: Chuck Hint, MD;  Location: [facility] OR;  Service: Vascular;  Laterality: Right;  . Ligation of competing branches of arteriovenous fistula Right 12/17/2013    Procedure: LIGATION OF COMPETING BRANCHES OF ARTERIOVENOUS FISTULA;  Surgeon: Chuck Hint, MD;  Location: [facility] OR;  Service: Vascular;  Laterality: Right;  . Ligation of arteriovenous  fistula Right 01/19/2014    Procedure: LIGATION OF ARTERIOVENOUS  FISTULA- RIGHT BRACHIOCEPHALIC FISTULA;  Surgeon: Fransisco Hertz, MD;  Location: [facility] OR;  Service: Vascular;  Laterality: Right;  . Shuntogram Right 09/10/2013    Procedure: Fistulogram;  Surgeon: Fransisco Hertz, MD;  Location: [facility] CATH LAB;  Service: Cardiovascular;  Laterality: Right;  . Peripheral vascular catheterization N/A 06/28/2015    Procedure: Dialysis/Perma Catheter Insertion;  Surgeon: Renford Dills, MD;  Location: [facility] INVASIVE CV LAB;  Service: Cardiovascular;  Laterality: N/A;  . Peripheral vascular catheterization N/A 09/28/2015    Procedure: Dialysis/Perma Catheter Insertion;  Surgeon: Renford Dills, MD;  Location: [facility] INVASIVE CV LAB;  Service: Cardiovascular;  Laterality: N/A;    Allergies  Allergies  Allergen Reactions  . Sulfa Antibiotics Nausea And Vomiting and Rash  . Sulfur Nausea And Vomiting and Rash  . Aspirin Nausea And Vomiting    History of Present Illness    82 year old female  with the above complex past medical history. She has a history of systolic dysfunction with previously documented EF as low as 20-25%. Most recent echo in March 2016, showed improvement of LV function with an EF of 45-50%. She has never had an ischemic evaluation as she and her family have opted for more conservative approach in the past. She also has a history of end-stage renal disease and has had multiple procedures related to graft occlusions and subsequent Port-A-Cath placements. She was last seen in early 2016, and since then has done quite well from a cardiac standpoint. A family member is with her today and notes that she is able to ambulate some at home but only short distances. Otherwise, she uses a wheelchair. She does have some degree of chronic dyspnea on exertion and wears oxygen with any activity. She has not been having chest pain and further, no PND, orthopnea, dizziness, syncope, edema, or early satiety.  Home Medications    Prior to Admission medications   Medication Sig Start Date End Date Taking? Authorizing Provider  acetaminophen (TYLENOL) 325 MG tablet Take 650 mg by mouth every 6 (six) hours as needed for mild pain.   Yes Historical Provider, MD  Amino Acid Infusion (PROSOL) 20 % SOLN Inject 4 L into the vein 7 days. 06/24/15  Yes Historical Provider, MD  atorvastatin (LIPITOR) 40 MG tablet Take 40 mg by mouth daily.  12/17/14  Yes Historical Provider, MD  carvedilol (COREG) 3.125 MG tablet Take 3.125 mg by mouth 2 (two) times daily with a meal.  12/17/14  Yes Historical Provider, MD  clopidogrel (PLAVIX) 75 MG tablet Take 75 mg by mouth daily.  01/04/15  Yes Historical Provider, MD  diazepam (VALIUM) 2 MG tablet Take 1 mg by mouth as needed for anxiety or sedation.    Yes Historical Provider, MD  donepezil (ARICEPT) 10 MG tablet Take 10 mg by mouth at bedtime.   Yes Historical Provider, MD  hydrALAZINE (APRESOLINE) 10 MG tablet Take 10 mg by mouth 3 (three) times daily.  12/17/14   Yes Historical Provider, MD  isosorbide mononitrate (IMDUR) 30 MG 24 hr tablet Take 30 mg by mouth daily.  12/21/14  Yes Historical Provider, MD  Memantine HCl ER (NAMENDA XR) 28 MG CP24 Take 28 mg by mouth daily.   Yes Historical Provider, MD  multivitamin (RENA-VIT) TABS tablet Take 1 tablet by mouth daily.   Yes Historical Provider, MD  sevelamer carbonate (RENVELA) 2.4 G PACK Take 2.4 g by mouth 3 (three) times daily with meals.   Yes Historical Provider, MD  sevelamer carbonate (RENVELA) 800 MG tablet Take 1,600 mg by mouth 3 (three) times daily with meals.    Yes Historical Provider, MD  torsemide (DEMADEX) 20 MG tablet Take 20 mg by mouth daily.   Yes Historical Provider, MD    Review of Systems    As above, overall doing well. Ambulation is somewhat limited by frailty. She does have chronic dyspnea on exertion  and wears oxygen with activity. She denies chest pain, palpitations, PND, orthopnea, dizziness, syncope, edema, or early satiety.  All other systems reviewed and are otherwise negative except as noted above.  Physical Exam    VS:  BP 102/70 mmHg  Pulse 84  Ht  (1.6 m)  Wt 106 lb 12 oz (48.421 kg)  BMI 18.91 kg/m2 , BMI Body mass index is 18.91 kg/(m^2). GEN: Well nourished, well developed, in no acute distress. HEENT: normal. Neck: Supple, no JVD, carotid bruits, or masses. Cardiac: RRR, 2/6 systolic ejection murmur loudest at the left upper sternal border. No rubs, or gallops. No clubbing, cyanosis, edema.  Radials/DP/PT 2+ and equal bilaterally.  Respiratory:  Respirations regular and unlabored, clear to auscultation bilaterally. GI: Soft, nontender, nondistended, BS + x 4. MS: no deformity or atrophy. Skin: warm and dry, no rash. Neuro:  Strength and sensation are intact. Psych: Normal affect.  Accessory Clinical Findings    ECG - regular sinus rhythm, 84, first-degree AV block, left axis, left anterior fascicular block, possible old inferior infarct with  inferolateral T-wave inversion-no acute changes.  Assessment & Plan    1.  Chronic systolic congestive heart failure/cardiomyopathy of unknown etiology: Patient has been doing well over the past year without any significant volume issues. She is on dialysis and thus all volume management is through dialysis. Her family member reports compliance with medications and she remains on beta blocker, hydralazine, and nitrate. Most recent EF in March 2016 showed improvement of LV function to 45-50%. She has never had an ischemic evaluation and they're not interested in this at this time.  2. Hypertensive heart disease: Blood pressure is stable on beta blocker, hydralazine, and nitrates. Patient's family member notes that she has not had any dialysis sessions that required cancellation secondary to hypotension.  3. Hyperlipidemia: She remains on statin therapy.  4. End-stage renal disease: Managed by nephrology. She is also frequently seen by vascular surgery secondary to issues with her access. She has been placed on Plavix by vascular surgery related to vascular access issues.  5. COPD: She uses oxygen for any amount of ambulation or activity. She is not wheezing on exam today.  6. Dementia: She has some degree of pleasant dementia. This appears to be stable. Family lives with her and takes care of her.  7. Disposition: Follow-up with Dr. Mariah Milling in 6 months or sooner if necessary.    Nicolasa Ducking, NP 12/27/2015, 10:16 AM

## 2015-12-27 NOTE — Patient Instructions (Addendum)
Medication Instructions:  Please continue your current medications  Labwork: None  Testing/Procedures: None  Follow-Up: Your physician wants you to follow-up in: 6 months w/ Dr. Gollan  You will receive a reminder letter in the mail two months in advance. If you don't receive a letter, please call our office to schedule the follow-up appointment.  If you need a refill on your cardiac medications before your next appointment, please call your pharmacy.   

## 2016-01-03 ENCOUNTER — Ambulatory Visit
Admission: AD | Admit: 2016-01-03 | Discharge: 2016-01-03 | Disposition: A | Discharge status: Home / Self Care | Payer: Medicare HMO | Source: Ambulatory Visit | Attending: Vascular Surgery | Admitting: Vascular Surgery

## 2016-01-03 ENCOUNTER — Other Ambulatory Visit: Payer: Self-pay | Admitting: Vascular Surgery

## 2016-01-03 ENCOUNTER — Encounter
Admission: AD | Disposition: A | Discharge status: Home / Self Care | Payer: Self-pay | Source: Ambulatory Visit | Attending: Vascular Surgery

## 2016-01-03 DIAGNOSIS — E1122 Type 2 diabetes mellitus with diabetic chronic kidney disease: Secondary | ICD-10-CM | POA: Diagnosis not present

## 2016-01-03 DIAGNOSIS — I5022 Chronic systolic (congestive) heart failure: Secondary | ICD-10-CM | POA: Insufficient documentation

## 2016-01-03 DIAGNOSIS — Z992 Dependence on renal dialysis: Secondary | ICD-10-CM | POA: Insufficient documentation

## 2016-01-03 DIAGNOSIS — T82898A Other specified complication of vascular prosthetic devices, implants and grafts, initial encounter: Secondary | ICD-10-CM | POA: Insufficient documentation

## 2016-01-03 DIAGNOSIS — Z9981 Dependence on supplemental oxygen: Secondary | ICD-10-CM | POA: Diagnosis not present

## 2016-01-03 DIAGNOSIS — Y841 Kidney dialysis as the cause of abnormal reaction of the patient, or of later complication, without mention of misadventure at the time of the procedure: Secondary | ICD-10-CM | POA: Insufficient documentation

## 2016-01-03 DIAGNOSIS — F0390 Unspecified dementia without behavioral disturbance: Secondary | ICD-10-CM | POA: Diagnosis not present

## 2016-01-03 DIAGNOSIS — I120 Hypertensive chronic kidney disease with stage 5 chronic kidney disease or end stage renal disease: Secondary | ICD-10-CM | POA: Diagnosis not present

## 2016-01-03 DIAGNOSIS — N2581 Secondary hyperparathyroidism of renal origin: Secondary | ICD-10-CM | POA: Diagnosis not present

## 2016-01-03 DIAGNOSIS — N186 End stage renal disease: Secondary | ICD-10-CM | POA: Diagnosis not present

## 2016-01-03 DIAGNOSIS — J449 Chronic obstructive pulmonary disease, unspecified: Secondary | ICD-10-CM | POA: Diagnosis not present

## 2016-01-03 DIAGNOSIS — I2510 Atherosclerotic heart disease of native coronary artery without angina pectoris: Secondary | ICD-10-CM | POA: Insufficient documentation

## 2016-01-03 HISTORY — PX: PERIPHERAL VASCULAR CATHETERIZATION: SHX172C

## 2016-01-03 SURGERY — DIALYSIS/PERMA CATHETER INSERTION
Anesthesia: Moderate Sedation

## 2016-01-03 MED ORDER — MIDAZOLAM HCL 2 MG/2ML IJ SOLN
INTRAMUSCULAR | Status: DC | PRN
  Administered 2016-01-03: 2 mg via INTRAVENOUS

## 2016-01-03 MED ORDER — HEPARIN (PORCINE) IN NACL 2-0.9 UNIT/ML-% IJ SOLN
INTRAMUSCULAR | Status: AC
  Filled 2016-01-03: qty 500

## 2016-01-03 MED ORDER — ALTEPLASE 2 MG IJ SOLR
INTRAMUSCULAR | Status: AC
  Filled 2016-01-03: qty 2

## 2016-01-03 MED ORDER — FENTANYL CITRATE (PF) 100 MCG/2ML IJ SOLN
INTRAMUSCULAR | Status: AC
  Filled 2016-01-03: qty 2

## 2016-01-03 MED ORDER — LIDOCAINE-EPINEPHRINE (PF) 1 %-1:200000 IJ SOLN
INTRAMUSCULAR | Status: AC
  Filled 2016-01-03: qty 30

## 2016-01-03 MED ORDER — FENTANYL CITRATE (PF) 100 MCG/2ML IJ SOLN
INTRAMUSCULAR | Status: DC | PRN
  Administered 2016-01-03: 50 ug via INTRAVENOUS

## 2016-01-03 MED ORDER — MIDAZOLAM HCL 5 MG/5ML IJ SOLN
INTRAMUSCULAR | Status: AC
  Filled 2016-01-03: qty 5

## 2016-01-03 MED ORDER — SODIUM CHLORIDE 0.9 % IV SOLN: INTRAVENOUS | Status: DC

## 2016-01-03 MED ORDER — DEXTROSE 5 % IV SOLN: 1.5000 g | INTRAVENOUS | Status: DC

## 2016-01-03 MED ORDER — HEPARIN SODIUM (PORCINE) 10000 UNIT/ML IJ SOLN
INTRAMUSCULAR | Status: AC
  Filled 2016-01-03: qty 1

## 2016-01-03 MED ORDER — HYDROMORPHONE HCL 1 MG/ML IJ SOLN: 1.0000 mg | Freq: Once | INTRAMUSCULAR | Status: DC

## 2016-01-03 MED ORDER — ONDANSETRON HCL 4 MG/2ML IJ SOLN: 4.0000 mg | Freq: Four times a day (QID) | INTRAMUSCULAR | Status: DC | PRN

## 2016-01-03 SURGICAL SUPPLY — 7 items
BIOPATCH RED 1 DISK 7.0 (GAUZE/BANDAGES/DRESSINGS) ×2 IMPLANT
BIOPATCH RED 1IN DISK 7.0MM (GAUZE/BANDAGES/DRESSINGS) ×1
CATH PALINDROME-P 44CM KIT (CATHETERS) ×2
GUIDEWIRE SUPER STIFF .035X180 (WIRE) ×3 IMPLANT
KIT CATH CHRNC PALINDROME PRCS (CATHETERS) ×1 IMPLANT
PACK ANGIOGRAPHY (CUSTOM PROCEDURE TRAY) ×3 IMPLANT
TOWEL OR 17X26 4PK STRL BLUE (TOWEL DISPOSABLE) ×3 IMPLANT

## 2016-01-03 NOTE — H&P (Signed)
[facility] VASCULAR & VEIN SPECIALISTS Admission History & Physical  MRN : [id number]  [name] is a 82 y.o. (1931-04-22) female who presents with chief complaint of catheter not working.  History of Present Illness: Patient is sent by dialysis secondary to catheter not working. No history of fever chills no further episodes of bleeding since the last catheter exchange  Current Facility-Administered Medications  Medication Dose Route Frequency Provider Last Rate Last Dose  . 0.9 %  sodium chloride infusion   Intravenous Continuous Kimberly A Stegmayer, PA-C      . cefUROXime (ZINACEF) 1.5 g in dextrose 5 % 50 mL IVPB  1.5 g Intravenous 30 min Pre-Op Ranae Plumber Stegmayer, PA-C      . HYDROmorphone (DILAUDID) injection 1 mg  1 mg Intravenous Once [employer], PA-C      . ondansetron (ZOFRAN) injection 4 mg  4 mg Intravenous Q6H PRN Tonette Lederer, PA-C        Past Medical History  Diagnosis Date  . COPD (chronic obstructive pulmonary disease) (HCC)     a. on home O2  . History of pneumonia 05/2013  . History of blood transfusion     a. after fistula ruptured  . Dementia   . ESRD (end stage renal disease) (HCC)     a. HD MWF; b. multiple fistula revisions now using permacath.  . Chronic systolic CHF (congestive heart failure) (HCC)     a. echo 06/14/14: EF 20-25%, diffuse HK, AK of mid-apicalinferolateral myocardium, DD, PASP 44 mm Hg; b. echo 11/2014 EF 30-35%, diastolic dysfunction, global HK, unable to exclude RWMA, mod MR; c. 01/2015 Echo: EF 45-50%, mild diff HK, gr1 DD, triv AI, mild MR, mildly dil LA, nl RV/PASP.  [name] Presumed CAD     a. presumed CAD - patient's son has declined cath/ischemic eval to date  . Secondary hyperparathyroidism [facility])     Past Surgical History  Procedure Laterality Date  . Dialysis fistula creation    . Ligation of arteriovenous  fistula Left 06/04/2013    Procedure: LIGATION OF ARTERIOVENOUS  FISTULA;  Surgeon: Fransisco Hertz, MD;   Location: [facility] OR;  Service: Vascular;  Laterality: Left;  . Insertion of dialysis catheter Right 06/04/2013    Procedure: INSERTION OF DIALYSIS CATHETER;  Surgeon: Fransisco Hertz, MD;  Location: [facility] OR;  Service: Vascular;  Laterality: Right;  . Cholecystectomy  1992  . Av fistula placement Right 07/07/2013    Procedure: ARTERIOVENOUS (AV) FISTULA CREATION - RIGHT BRACHIAL CEPHALIC ;  Surgeon: Fransisco Hertz, MD;  Location: [facility] OR;  Service: Vascular;  Laterality: Right;  . Removal of a dialysis catheter Right 11/09/2013    Procedure: REMOVAL OF A DIALYSIS CATHETER- Right TDC ;  Surgeon: Fransisco Hertz, MD;  Location: [facility] OR;  Service: Vascular;  Laterality: Right;  . Insertion of dialysis catheter Left 11/09/2013    Procedure: INSERTION OF DIALYSIS CATHETER- Left TDC;  Surgeon: Fransisco Hertz, MD;  Location: [facility] OR;  Service: Vascular;  Laterality: Left;  . Exchange of a dialysis catheter Left 12/17/2013    Procedure: EXCHANGE OF A DIALYSIS CATHETER;  Surgeon: Chuck Hint, MD;  Location: [facility] OR;  Service: Vascular;  Laterality: Left;  . Fistulogram Right 12/17/2013    Procedure: FISTULOGRAM- RIGHT ARM- POSSIBLE INTERVENTION;  Surgeon: Chuck Hint, MD;  Location: [facility] OR;  Service: Vascular;  Laterality: Right;  . Ligation of competing branches of arteriovenous fistula Right 12/17/2013    Procedure:  LIGATION OF COMPETING BRANCHES OF ARTERIOVENOUS FISTULA;  Surgeon: Chuck Hint, MD;  Location: [facility] OR;  Service: Vascular;  Laterality: Right;  . Ligation of arteriovenous  fistula Right 01/19/2014    Procedure: LIGATION OF ARTERIOVENOUS  FISTULA- RIGHT BRACHIOCEPHALIC FISTULA;  Surgeon: Fransisco Hertz, MD;  Location: [facility] OR;  Service: Vascular;  Laterality: Right;  . Shuntogram Right 09/10/2013    Procedure: Fistulogram;  Surgeon: Fransisco Hertz, MD;  Location: [facility] CATH LAB;  Service: Cardiovascular;  Laterality: Right;  . Peripheral vascular catheterization N/A 06/28/2015    Procedure: Dialysis/Perma  Catheter Insertion;  Surgeon: Renford Dills, MD;  Location: [facility] INVASIVE CV LAB;  Service: Cardiovascular;  Laterality: N/A;  . Peripheral vascular catheterization N/A 09/28/2015    Procedure: Dialysis/Perma Catheter Insertion;  Surgeon: Renford Dills, MD;  Location: [facility] INVASIVE CV LAB;  Service: Cardiovascular;  Laterality: N/A;    Social History Social History  Substance Use Topics  . Smoking status: Never Smoker   . Smokeless tobacco: Never Used  . Alcohol Use: No    Family History Family History  Problem Relation Age of Onset  . Family history unknown: Yes   no family history of porphyria bleeding or clotting disorders  Allergies  Allergen Reactions  . Sulfa Antibiotics Nausea And Vomiting and Rash  . Sulfur Nausea And Vomiting and Rash  . Aspirin Nausea And Vomiting     REVIEW OF SYSTEMS (Negative unless checked)  Constitutional: Weight loss  Fever  Chills Cardiac: Chest pain   Chest pressure   Palpitations   Shortness of breath when laying flat   Shortness of breath at rest   Shortness of breath with exertion. Vascular:  Pain in legs with walking   Pain in legs at rest   Pain in legs when laying flat   Claudication   Pain in feet when walking  Pain in feet at rest  Pain in feet when laying flat   History of DVT   Phlebitis   Swelling in legs   Varicose veins   Non-healing ulcers Pulmonary:   Uses home oxygen   Productive cough   Hemoptysis   Wheeze  COPD   Asthma Neurologic:  Dizziness  Blackouts   Seizures   History of stroke   History of TIA  Aphasia   Temporary blindness   Dysphagia   Weakness or numbness in arms   Weakness or numbness in legs Musculoskeletal:  Arthritis   Joint swelling   Joint pain   Low back pain Hematologic:  Easy bruising  Easy bleeding   Hypercoagulable state   Anemic  Hepatitis Gastrointestinal:  Blood in stool   Vomiting blood   Gastroesophageal reflux/heartburn   Difficulty swallowing. Genitourinary:  Chronic kidney disease   Difficult urination  Frequent urination  Burning with urination   Blood in urine Skin:  Rashes   Ulcers   Wounds Psychological:  History of anxiety    History of major depression.  Physical Examination  Filed Vitals:   01/03/16 1345  BP: 137/79  Pulse: 82  Temp: 97.8 F (36.6 C)  TempSrc: Oral  Resp: 14  SpO2: 97%   There is no weight on file to calculate BMI. Gen: WD/WN, NAD Head: [place]/AT, No temporalis wasting. Prominent temp pulse not noted. Ear/Nose/Throat: Hearing grossly intact, nares w/o erythema or drainage, oropharynx w/o Erythema/Exudate,  Eyes: PERRLA, EOMI.  Neck: Supple, no nuchal rigidity.  No bruit or JVD.  Pulmonary:  Good air movement, clear to auscultation bilaterally,  no use of accessory muscles.  Cardiac: RRR, normal S1, S2, no Murmurs, rubs or gallops. Vascular: Femoral catheter with blood in the ports no active drainage. Gastrointestinal: soft, non-tender/non-distended. No guarding/reflex.  Musculoskeletal: M/S 5/5 throughout.  Extremities without ischemic changes.  No deformity or atrophy.  Neurologic: CN 2-12 intact. Pain and light touch intact in extremities.  Symmetrical.  Speech is fluent. Motor exam as listed above. Psychiatric: Judgment intact, Mood & affect appropriate for pt's clinical situation. Dermatologic: No rashes or ulcers noted.  No cellulitis or open wounds. Lymph : No Cervical, Axillary, or Inguinal lymphadenopathy.     CBC Lab Results  Component Value Date   WBC 7.2 09/30/2015   HGB 7.8* 09/30/2015   HCT 24.8* 09/30/2015   MCV 87.7 09/30/2015   PLT 111* 09/30/2015    BMET    Component Value Date/Time   NA 137 09/30/2015 0940   NA 135* 12/17/2014 0509   K 4.0 09/30/2015 0940   K 4.3 12/17/2014 0509   CL 100* 09/30/2015 0940   CL 98 12/17/2014 0509   CO2 29 09/30/2015 0940   CO2 29 12/17/2014 0509    GLUCOSE 118* 09/30/2015 0940   GLUCOSE 87 12/17/2014 0509   BUN 64* 09/30/2015 0940   BUN 34* 12/17/2014 0509   CREATININE 6.47* 09/30/2015 0940   CREATININE 6.62* 12/17/2014 0509   CALCIUM 7.8* 09/30/2015 0940   CALCIUM 7.7* 12/17/2014 0509   CALCIUM 7.7* 06/10/2013 0609   GFRNONAA 5* 09/30/2015 0940   GFRNONAA 6* 12/17/2014 0509   GFRNONAA 5* 06/16/2014 0406   GFRAA 6* 09/30/2015 0940   GFRAA 8* 12/17/2014 0509   GFRAA 6* 06/16/2014 0406   CrCl cannot be calculated (Patient has no serum creatinine result on file.).  COAG Lab Results  Component Value Date   INR 1.2 12/13/2014   INR 1.11 06/04/2013   INR 1.17 04/25/2013    Radiology No results found.  Assessment/Plan 1.  Complication dialysis device with nonfunction of the dialysis catheter:  Patient's femoral dialysis catheter is thrombosed. The patient will undergo exchange over the wire.  2.  End-stage renal disease requiring hemodialysis:  Patient will continue dialysis therapy without further interruption.  Dialysis has already been arranged since the patient missed their previous session 3.  Hypertension:  Patient will continue medical management; nephrology is following no changes in oral medications. 4. Diabetes mellitus:  Glucose will be monitored and oral medications been held this morning once the patient has undergone the patient's procedure po intake will be reinitiated and again Accu-Cheks will be used to assess the blood glucose level and treat as needed. The patient will be restarted on the patient's usual hypoglycemic regime 5.  Coronary artery disease:  EKG will be monitored. Nitrates will be used if needed. The patient's oral cardiac medications will be continued.     Schnier, Latina Craver, MD  01/03/2016 3:24 PM

## 2016-01-03 NOTE — Op Note (Signed)
OPERATIVE NOTE   PROCEDURE: 1. Insertion of tunneled dialysis catheter right femoral approach same venous access.  PRE-OPERATIVE DIAGNOSIS: Consultation dialysis device with nonfunction of tunneled catheter  POST-OPERATIVE DIAGNOSIS: Same  SURGEON: Aysha Livecchi, Latina Craver  ANESTHESIA: Conscious sedation was administered under my direct supervision. IV Versed plus fentanyl were utilized. Continuous ECG, pulse oximetry and blood pressure was monitored throughout the entire procedure. Conscious sedation was for a total of 30 minutes.  ESTIMATED BLOOD LOSS: Minimal cc  CONTRAST USED:  None  FLUOROSCOPY TIME:  1  minutes  INDICATIONS:   [name] is a 82 y.o.y.o. female who presents with poor flow and nonfunction of the tunneled dialysis catheter.  Adequate dialysis has not been possible.  DESCRIPTION: After obtaining full informed written consent, the patient was positioned supine. The right thigh and catheter was prepped and draped in a sterile fashion. The cuff is localized and using blunt and sharp dissection it is freed from the surrounding adhesions.  The existing catheter is then transected proximal to the cuff.  The guidewire is advanced without difficulty under fluoroscopy.  Dilators are passed over the wire as needed and the tunneled dialysis catheter is fed into the central venous system without difficulty.  Under fluoroscopy the catheter tip positioned at the atrial caval junction.  Both lumens aspirate and flush easily. After verification of smooth contour with proper tip position under fluoroscopy the catheter is packed with 5000 units of heparin per lumen.  Catheter secured to the skin of the right thigh with 0 silk. A sterile dressing is applied with a Biopatch.  COMPLICATIONS: None  CONDITION: [facility] Office:  [phone]   01/03/2016,4:32 PM

## 2016-01-03 NOTE — Discharge Instructions (Signed)

## 2016-01-05 ENCOUNTER — Encounter: Payer: Self-pay | Admitting: Vascular Surgery

## 2016-01-24 ENCOUNTER — Other Ambulatory Visit: Payer: Self-pay | Admitting: Vascular Surgery

## 2016-01-26 ENCOUNTER — Ambulatory Visit
Admission: RE | Admit: 2016-01-26 | Discharge: 2016-01-26 | Disposition: A | Discharge status: Home / Self Care | Payer: Medicare HMO | Source: Ambulatory Visit | Attending: Vascular Surgery | Admitting: Vascular Surgery

## 2016-01-26 ENCOUNTER — Encounter
Admission: RE | Disposition: A | Discharge status: Home / Self Care | Payer: Self-pay | Source: Ambulatory Visit | Attending: Vascular Surgery

## 2016-01-26 DIAGNOSIS — F0390 Unspecified dementia without behavioral disturbance: Secondary | ICD-10-CM | POA: Insufficient documentation

## 2016-01-26 DIAGNOSIS — Z4902 Encounter for fitting and adjustment of peritoneal dialysis catheter: Secondary | ICD-10-CM | POA: Diagnosis present

## 2016-01-26 DIAGNOSIS — Z882 Allergy status to sulfonamides status: Secondary | ICD-10-CM | POA: Diagnosis not present

## 2016-01-26 DIAGNOSIS — I5022 Chronic systolic (congestive) heart failure: Secondary | ICD-10-CM | POA: Diagnosis not present

## 2016-01-26 DIAGNOSIS — Z9049 Acquired absence of other specified parts of digestive tract: Secondary | ICD-10-CM | POA: Insufficient documentation

## 2016-01-26 DIAGNOSIS — Z886 Allergy status to analgesic agent status: Secondary | ICD-10-CM | POA: Insufficient documentation

## 2016-01-26 DIAGNOSIS — N186 End stage renal disease: Secondary | ICD-10-CM | POA: Diagnosis not present

## 2016-01-26 DIAGNOSIS — N2581 Secondary hyperparathyroidism of renal origin: Secondary | ICD-10-CM | POA: Insufficient documentation

## 2016-01-26 DIAGNOSIS — Z8701 Personal history of pneumonia (recurrent): Secondary | ICD-10-CM | POA: Diagnosis not present

## 2016-01-26 DIAGNOSIS — Z992 Dependence on renal dialysis: Secondary | ICD-10-CM | POA: Insufficient documentation

## 2016-01-26 DIAGNOSIS — J449 Chronic obstructive pulmonary disease, unspecified: Secondary | ICD-10-CM | POA: Diagnosis not present

## 2016-01-26 DIAGNOSIS — Z9889 Other specified postprocedural states: Secondary | ICD-10-CM | POA: Insufficient documentation

## 2016-01-26 DIAGNOSIS — Z9981 Dependence on supplemental oxygen: Secondary | ICD-10-CM | POA: Diagnosis not present

## 2016-01-26 HISTORY — PX: PERIPHERAL VASCULAR CATHETERIZATION: SHX172C

## 2016-01-26 SURGERY — DIALYSIS/PERMA CATHETER REMOVAL
Anesthesia: Moderate Sedation

## 2016-01-26 MED ORDER — LIDOCAINE-EPINEPHRINE (PF) 1 %-1:200000 IJ SOLN
INTRAMUSCULAR | Status: DC | PRN
  Administered 2016-01-26: 20 mL via INTRADERMAL

## 2016-01-26 SURGICAL SUPPLY — 4 items
FCP FG STRG 5.5XNS LF DISP (INSTRUMENTS) ×1
FORCEPS FG STRG 5.5XNS LF DISP (INSTRUMENTS) ×1 IMPLANT
FORCEPS KELLY 5.5 STR (INSTRUMENTS) ×2
TRAY LACERAT/PLASTIC (MISCELLANEOUS) ×3 IMPLANT

## 2016-01-26 NOTE — H&P (Signed)
[facility] VASCULAR & VEIN SPECIALISTS Admission History & Physical  MRN : [id number]  [name] is a 82 y.o. (July 08, 1931) female who presents with chief complaint of No chief complaint on file. [name]  History of Present Illness: Patient is an 82 year old with significant dementia who provides no history but the dialysis center has sent her over for removal of her catheter. Her son says that she is now letting them use the catheter where previously she would not allow it and she remained catheter dependent even though her graft was functional. She has no fevers or chills or signs of infection. She has no other recent issues or problems.  No current facility-administered medications for this encounter.    Past Medical History  Diagnosis Date  . COPD (chronic obstructive pulmonary disease) (HCC)     a. on home O2  . History of pneumonia 05/2013  . History of blood transfusion     a. after fistula ruptured  . Dementia   . ESRD (end stage renal disease) (HCC)     a. HD MWF; b. multiple fistula revisions now using permacath.  . Chronic systolic CHF (congestive heart failure) (HCC)     a. echo 06/14/14: EF 20-25%, diffuse HK, AK of mid-apicalinferolateral myocardium, DD, PASP 44 mm Hg; b. echo 11/2014 EF 30-35%, diastolic dysfunction, global HK, unable to exclude RWMA, mod MR; c. 01/2015 Echo: EF 45-50%, mild diff HK, gr1 DD, triv AI, mild MR, mildly dil LA, nl RV/PASP.  [name] Presumed CAD     a. presumed CAD - patient's son has declined cath/ischemic eval to date  . Secondary hyperparathyroidism [facility])     Past Surgical History  Procedure Laterality Date  . Dialysis fistula creation    . Ligation of arteriovenous  fistula Left 06/04/2013    Procedure: LIGATION OF ARTERIOVENOUS  FISTULA;  Surgeon: Fransisco Hertz, MD;  Location: [facility] OR;  Service: Vascular;  Laterality: Left;  . Insertion of dialysis catheter Right 06/04/2013    Procedure: INSERTION OF DIALYSIS CATHETER;  Surgeon: Fransisco Hertz, MD;   Location: [facility] OR;  Service: Vascular;  Laterality: Right;  . Cholecystectomy  1992  . Av fistula placement Right 07/07/2013    Procedure: ARTERIOVENOUS (AV) FISTULA CREATION - RIGHT BRACHIAL CEPHALIC ;  Surgeon: Fransisco Hertz, MD;  Location: [facility] OR;  Service: Vascular;  Laterality: Right;  . Removal of a dialysis catheter Right 11/09/2013    Procedure: REMOVAL OF A DIALYSIS CATHETER- Right TDC ;  Surgeon: Fransisco Hertz, MD;  Location: [facility] OR;  Service: Vascular;  Laterality: Right;  . Insertion of dialysis catheter Left 11/09/2013    Procedure: INSERTION OF DIALYSIS CATHETER- Left TDC;  Surgeon: Fransisco Hertz, MD;  Location: [facility] OR;  Service: Vascular;  Laterality: Left;  . Exchange of a dialysis catheter Left 12/17/2013    Procedure: EXCHANGE OF A DIALYSIS CATHETER;  Surgeon: Chuck Hint, MD;  Location: [facility] OR;  Service: Vascular;  Laterality: Left;  . Fistulogram Right 12/17/2013    Procedure: FISTULOGRAM- RIGHT ARM- POSSIBLE INTERVENTION;  Surgeon: Chuck Hint, MD;  Location: [facility] OR;  Service: Vascular;  Laterality: Right;  . Ligation of competing branches of arteriovenous fistula Right 12/17/2013    Procedure: LIGATION OF COMPETING BRANCHES OF ARTERIOVENOUS FISTULA;  Surgeon: Chuck Hint, MD;  Location: [facility] OR;  Service: Vascular;  Laterality: Right;  . Ligation of arteriovenous  fistula Right 01/19/2014    Procedure: LIGATION OF ARTERIOVENOUS  FISTULA- RIGHT BRACHIOCEPHALIC FISTULA;  Surgeon: Fransisco HertzBrian L Chen, MD;  [facility];  Service: Vascular;  Laterality: Right;  . Shuntogram Right 09/10/2013    Procedure: Fistulogram;  Surgeon: Fransisco HertzBrian L Chen, MD;  [facility];  Service: Cardiovascular;  Laterality: Right;  . Peripheral vascular catheterization N/A 06/28/2015    Procedure: Dialysis/Perma Catheter Insertion;  Surgeon: Renford DillsGregory G Schnier, MD;  Location: [facility] INVASIVE CV LAB;  Service: Cardiovascular;  Laterality: N/A;  . Peripheral vascular catheterization N/A  09/28/2015    Procedure: Dialysis/Perma Catheter Insertion;  Surgeon: Renford DillsGregory G Schnier, MD;  Location: [facility] INVASIVE CV LAB;  Service: Cardiovascular;  Laterality: N/A;  . Peripheral vascular catheterization N/A 01/03/2016    Procedure: Dialysis/Perma Catheter Insertion;  Surgeon: Renford DillsGregory G Schnier, MD;  Location: [facility] INVASIVE CV LAB;  Service: Cardiovascular;  Laterality: N/A;    Social History Social History  Substance Use Topics  . Smoking status: Never Smoker   . Smokeless tobacco: Never Used  . Alcohol Use: No    Family History Family History  Problem Relation Age of Onset  . Family history unknown: Yes  Patient unable to provide  Allergies  Allergen Reactions  . Sulfa Antibiotics Nausea And Vomiting and Rash  . Sulfur Nausea And Vomiting and Rash  . Aspirin Nausea And Vomiting     REVIEW OF SYSTEMS (Negative unless checked)  Constitutional: [] Weight loss  [] Fever  [] Chills Cardiac: [] Chest pain   [] Chest pressure   [] Palpitations   [] Shortness of breath when laying flat   [] Shortness of breath at rest   [] Shortness of breath with exertion. Vascular:  [] Pain in legs with walking   [] Pain in legs at rest   [] Pain in legs when laying flat   [] Claudication   [] Pain in feet when walking  [] Pain in feet at rest  [] Pain in feet when laying flat   [] History of DVT   [] Phlebitis   [] Swelling in legs   [] Varicose veins   [] Non-healing ulcers Pulmonary:   [] Uses home oxygen   [] Productive cough   [] Hemoptysis   [] Wheeze  [] COPD   [] Asthma Neurologic:  [] Dizziness  [] Blackouts   [] Seizures   [] History of stroke   [] History of TIA  [] Aphasia   [] Temporary blindness   [] Dysphagia   [] Weakness or numbness in arms   [] Weakness or numbness in legs Musculoskeletal:  [] Arthritis   [] Joint swelling   [] Joint pain   [] Low back pain Hematologic:  [] Easy bruising  [] Easy bleeding   [] Hypercoagulable state   [x] Anemic  [] Hepatitis Gastrointestinal:  [] Blood in stool   [] Vomiting blood   [] Gastroesophageal reflux/heartburn   [] Difficulty swallowing. Genitourinary:  [x] Chronic kidney disease   [] Difficult urination  [] Frequent urination  [] Burning with urination   [] Blood in urine Skin:  [] Rashes   [] Ulcers   [] Wounds Psychological:  [] History of anxiety   []  History of major depression.  Physical Examination  Filed Vitals:   01/26/16 0935  BP: 109/73  Pulse: 81  Temp: 97.9 F (36.6 C)  Resp: 16  SpO2: 100%   There is no weight on file to calculate BMI. Gen: WD/WN, NAD Head: /AT, No temporalis wasting. Prominent temp pulse not noted. Ear/Nose/Throat: Hearing grossly intact, nares w/o erythema or drainage, oropharynx w/o Erythema/Exudate,  Eyes: PERRLA, EOMI.  Neck: Supple, no nuchal rigidity.  No JVD.  Pulmonary:  Good air movement, no use of accessory muscles.  Cardiac: RRR, normal S1, S2 Vascular: thrill in HeRO.  Permcath in place Vessel Right Left  Radial Palpable Palpable  Gastrointestinal: soft, non-tender/non-distended. No guarding/reflex.  Musculoskeletal: M/S 5/5 throughout.  Extremities without ischemic changes.  No deformity or atrophy.  Neurologic: CN 2-12 intact. Pain and light touch intact in extremities.  Symmetrical. Motor exam as listed above. Psychiatric: very poor historian and poor insight Dermatologic: No rashes or ulcers noted.  No cellulitis or open wounds. Lymph : No Cervical, Axillary, or Inguinal lymphadenopathy.      CBC Lab Results  Component Value Date   WBC 7.2 09/30/2015   HGB 7.8* 09/30/2015   HCT 24.8* 09/30/2015   MCV 87.7 09/30/2015   PLT 111* 09/30/2015    BMET    Component Value Date/Time   NA 137 09/30/2015 0940   NA 135* 12/17/2014 0509   K 4.0 09/30/2015 0940   K 4.3 12/17/2014 0509   CL 100* 09/30/2015 0940   CL 98 12/17/2014 0509   CO2 29 09/30/2015 0940   CO2 29 12/17/2014 0509   GLUCOSE 118* 09/30/2015 0940   GLUCOSE 87 12/17/2014 0509   BUN 64*  09/30/2015 0940   BUN 34* 12/17/2014 0509   CREATININE 6.47* 09/30/2015 0940   CREATININE 6.62* 12/17/2014 0509   CALCIUM 7.8* 09/30/2015 0940   CALCIUM 7.7* 12/17/2014 0509   CALCIUM 7.7* 06/10/2013 0609   GFRNONAA 5* 09/30/2015 0940   GFRNONAA 6* 12/17/2014 0509   GFRNONAA 5* 06/16/2014 0406   GFRAA 6* 09/30/2015 0940   GFRAA 8* 12/17/2014 0509   GFRAA 6* 06/16/2014 0406   CrCl cannot be calculated (Unknown ideal weight.).  COAG Lab Results  Component Value Date   INR 1.2 12/13/2014   INR 1.11 06/04/2013   INR 1.17 04/25/2013    Radiology No results found.    Assessment/Plan 1. ESRD. Patent hero graft which patient is now allowing them to use. With that, we can take out her catheter and the dialysis center has requested this. This will be removed today. 2. Dementia. Difficult situation has her compliance is certainly an issue. 3. Coronary disease. Stable.   Terrelle Ruffolo, MD  01/26/2016 9:40 AM

## 2016-01-26 NOTE — Op Note (Signed)
Operative Note     Preoperative diagnosis:   1. ESRD with functional permanent access  Postoperative diagnosis:  1. ESRD with functional permanent access  Procedure:  Removal of right femoral Permcath  Surgeon:  Festus BarrenJason Jaylenne Hamelin, MD  Anesthesia:  Local  EBL:  Minimal  Indication for the Procedure:  The patient has a functional permanent dialysis access and no longer needs their permcath.  This can be removed.  Risks and benefits are discussed and informed consent is obtained.  Description of the Procedure:  The patient's right groin, thigh, and existing catheter were sterilely prepped and draped. The area around the catheter was anesthetized copiously with 1% lidocaine. The catheter was dissected out with curved hemostats until the cuff was freed from the surrounding fibrous sheath. The fiber sheath was transected, and the catheter was then removed in its entirety using gentle traction. Pressure was held and sterile dressings were placed. The patient tolerated the procedure well and was taken to the recovery room in stable condition.     Danitza Schoenfeldt  01/26/2016, 10:52 AM

## 2016-01-27 ENCOUNTER — Encounter: Payer: Self-pay | Admitting: Vascular Surgery

## 2016-05-16 ENCOUNTER — Other Ambulatory Visit: Payer: Self-pay | Admitting: Vascular Surgery

## 2016-05-17 ENCOUNTER — Encounter
Admission: RE | Admit: 2016-05-17 | Discharge: 2016-05-17 | Disposition: A | Discharge status: Home / Self Care | Payer: Medicare HMO | Source: Ambulatory Visit | Attending: Vascular Surgery | Admitting: Vascular Surgery

## 2016-05-17 DIAGNOSIS — Z01812 Encounter for preprocedural laboratory examination: Secondary | ICD-10-CM | POA: Insufficient documentation

## 2016-05-17 HISTORY — DX: Adverse effect of unspecified anesthetic, initial encounter: T41.45XA

## 2016-05-17 HISTORY — DX: Other complications of anesthesia, initial encounter: T88.59XA

## 2016-05-17 HISTORY — DX: Unspecified osteoarthritis, unspecified site: M19.90

## 2016-05-17 HISTORY — DX: Other specified postprocedural states: Z98.890

## 2016-05-17 HISTORY — DX: Nausea with vomiting, unspecified: R11.2

## 2016-05-17 HISTORY — DX: Reserved for inherently not codable concepts without codable children: IMO0001

## 2016-05-17 HISTORY — DX: Anemia, unspecified: D64.9

## 2016-05-17 LAB — CBC WITH DIFFERENTIAL/PLATELET
Basophils Absolute: 0.1 10*3/uL (ref 0–0.1)
Basophils Relative: 2 %
Eosinophils Absolute: 0.4 10*3/uL (ref 0–0.7)
Eosinophils Relative: 7 %
HEMATOCRIT: 36.7 % (ref 35.0–47.0)
HEMOGLOBIN: 12 g/dL (ref 12.0–16.0)
Lymphs Abs: 1.1 10*3/uL (ref 1.0–3.6)
MCH: 31.9 pg (ref 26.0–34.0)
MCHC: 32.7 g/dL (ref 32.0–36.0)
MCV: 97.3 fL (ref 80.0–100.0)
Monocytes Absolute: 0.5 10*3/uL (ref 0.2–0.9)
Monocytes Relative: 9 %
NEUTROS ABS: 3.9 10*3/uL (ref 1.4–6.5)
Neutrophils Relative %: 64 %
Platelets: 163 10*3/uL (ref 150–440)
RBC: 3.78 MIL/uL — ABNORMAL LOW (ref 3.80–5.20)
RDW: 16.6 % — AB (ref 11.5–14.5)
WBC: 6 10*3/uL (ref 3.6–11.0)

## 2016-05-17 LAB — BASIC METABOLIC PANEL
ANION GAP: 12 (ref 5–15)
BUN: 52 mg/dL — ABNORMAL HIGH (ref 6–20)
CO2: 31 mmol/L (ref 22–32)
Calcium: 9.3 mg/dL (ref 8.9–10.3)
Chloride: 94 mmol/L — ABNORMAL LOW (ref 101–111)
Creatinine, Ser: 3.8 mg/dL — ABNORMAL HIGH (ref 0.44–1.00)
GFR calc Af Amer: 12 mL/min — ABNORMAL LOW (ref >60)
GFR, EST NON AFRICAN AMERICAN: 10 mL/min — AB (ref >60)
Glucose, Bld: 102 mg/dL — ABNORMAL HIGH (ref 65–99)
POTASSIUM: 3 mmol/L — AB (ref 3.5–5.1)
SODIUM: 137 mmol/L (ref 135–145)

## 2016-05-17 LAB — TYPE AND SCREEN
ABO/RH(D): A POS
Antibody Screen: NEGATIVE

## 2016-05-17 LAB — APTT: APTT: 29 s (ref 24–36)

## 2016-05-17 LAB — PROTIME-INR
INR: 1.08
Prothrombin Time: 14.2 seconds (ref 11.4–15.0)

## 2016-05-17 IMAGING — CR DG HIP (WITH OR WITHOUT PELVIS) 2-3V*L*
3 series · 3 of 3 positions shown · non-contrast
Comparison: None.

CLINICAL DATA: Left hip pain.

EXAM:
LEFT HIP - COMPLETE 2+ VIEW

[view not recorded (1 of 3)]
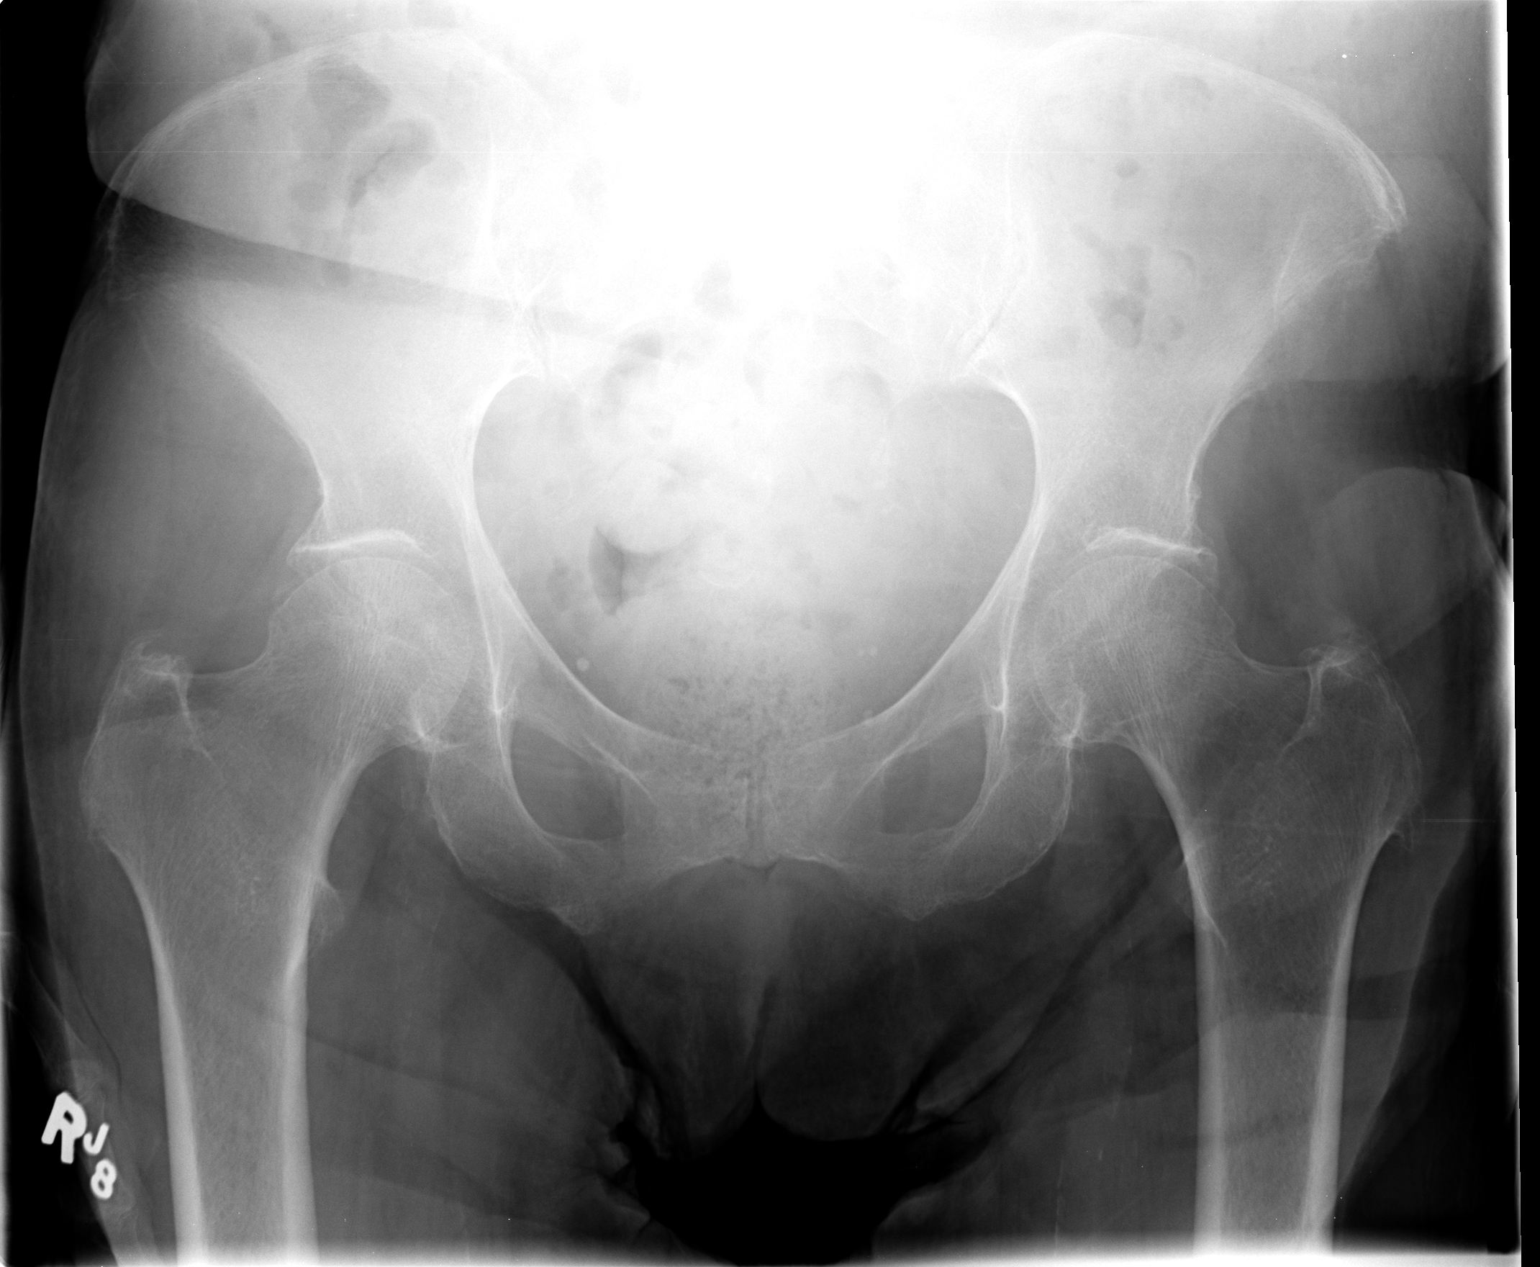

[view not recorded (2 of 3)]
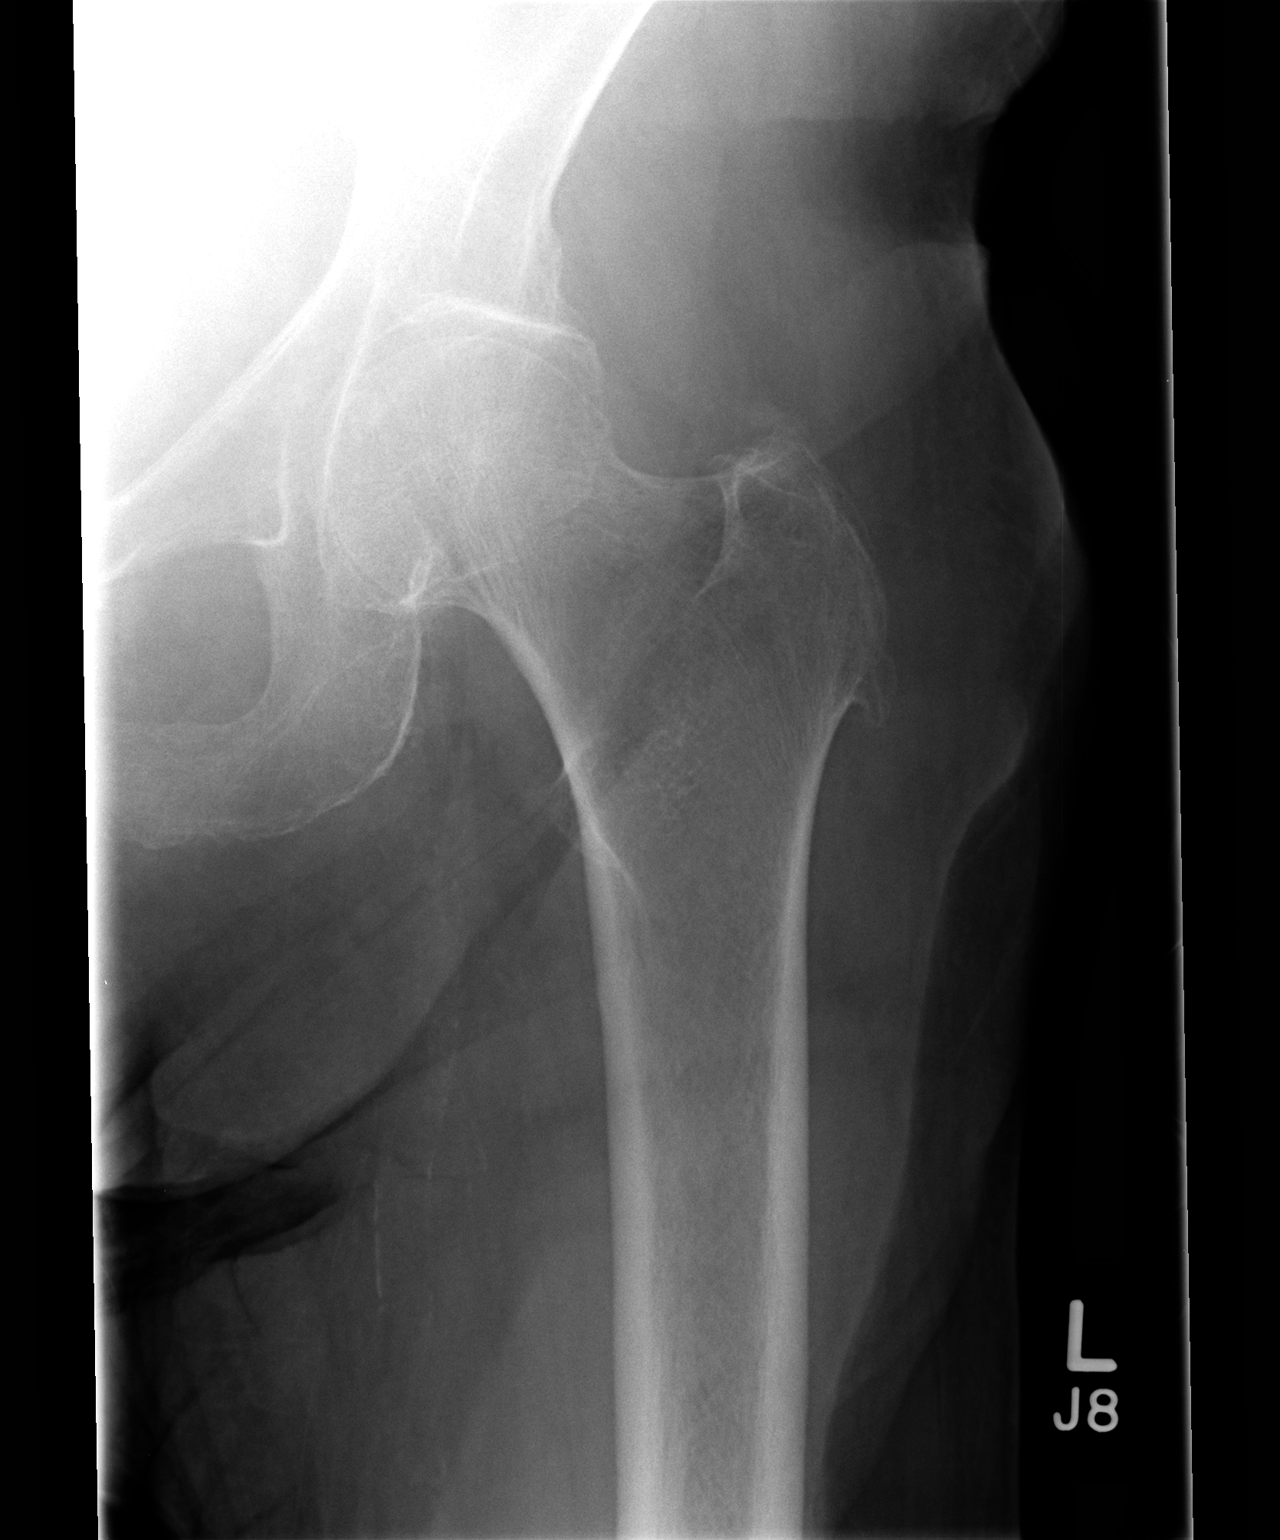

[view not recorded (3 of 3)]
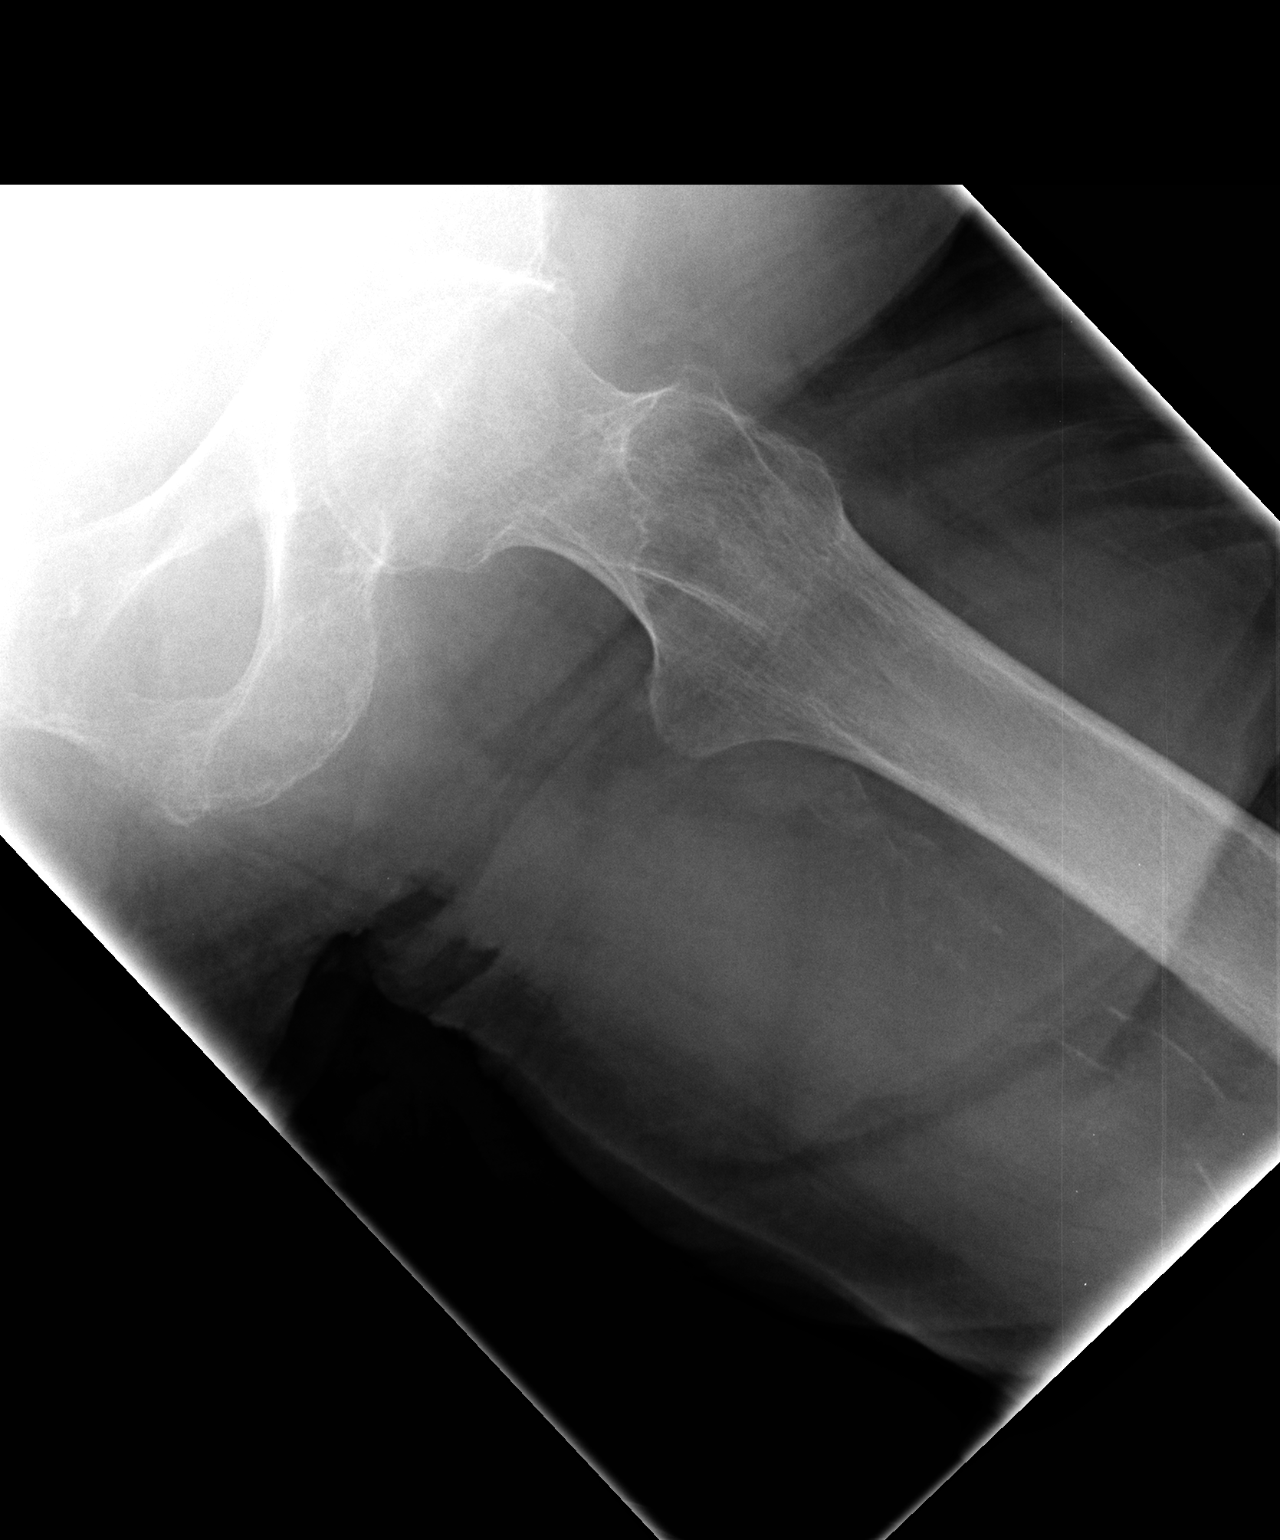

[3 of 3 positions shown; findings below may reference images not displayed]

FINDINGS: There is moderate diffuse decreased bone mineralization. There are
mild symmetric degenerative changes of the hips. There is no
definite acute fracture or dislocation.
IMPRESSION: No acute findings.

Mild symmetric degenerative changes of the hips.

## 2016-05-17 NOTE — Patient Instructions (Signed)
  Your procedure is scheduled on: 05/25/16 Report to Day Surgery. [facility] To find out your arrival time please call [phone] between 1PM - 3PM on  05/24/16  Remember: Instructions that are not followed completely may result in serious medical risk, up to and including death, or upon the discretion of your surgeon and anesthesiologist your surgery may need to be rescheduled.    __X__ 1. Do not eat food or drink liquids after midnight. No gum chewing or hard candies.     __X__ 2. No Alcohol for 24 hours before or after surgery.   _X___ 3. Do Not Smoke For 24 Hours Prior to Your Surgery.   ____ 4. Bring all medications with you on the day of surgery if instructed.    __X__ 5. Notify your doctor if there is any change in your medical condition     (cold, fever, infections).       Do not wear jewelry, make-up, hairpins, clips or nail polish.  Do not wear lotions, powders, or perfumes. You may wear deodorant.  Do not shave 48 hours prior to surgery. Men may shave face and neck.  Do not bring valuables to the hospital.    [facility] not responsible for any belongings or valuables.               Contacts, dentures or bridgework may not be worn into surgery.  Leave your suitcase in the car. After surgery it may be brought to your room.  For patients admitted to the hospital, discharge time is determined by your                treatment team.   Patients discharged the day of surgery will not be allowed to drive home.   Please read over the following fact sheets that you were given:   Surgical Site Infection Prevention   _X___ Take these medicines the morning of surgery with A SIP OF WATER:    1. ATORVASTATIN  2. CARVEDILOL  3.   4.  5.  6.  ____ Fleet Enema (as directed)   ____ Use CHG Soap as directed  ____ Use inhalers on the day of surgery  ____ Stop metformin 2 days prior to surgery    ____ Take 1/2 of usual insulin dose the night before surgery and  none on the morning of surgery.   __X__ Stop Coumadin/Plavix/aspirin on     STOP PLAVIX 4 DAYS BEFORE SURGERY  ____ Stop Anti-inflammatories on   ____ Stop supplements until after surgery.    ____ Bring C-Pap to the hospital.   SAGE WIPES AS INSTRUCTED

## 2016-05-18 NOTE — Pre-Procedure Instructions (Addendum)
Potassium 3.0 results faxed to Dr Gilda CreaseSchnier.

## 2016-05-21 IMAGING — CR DG CHEST 2V
2 series · 2 of 2 positions shown · non-contrast
Comparison: Chest x-ray 04/02/2014.

CLINICAL DATA: Weakness and shortness of breath.

EXAM:
CHEST  2 VIEW

[view not recorded (1 of 2)]
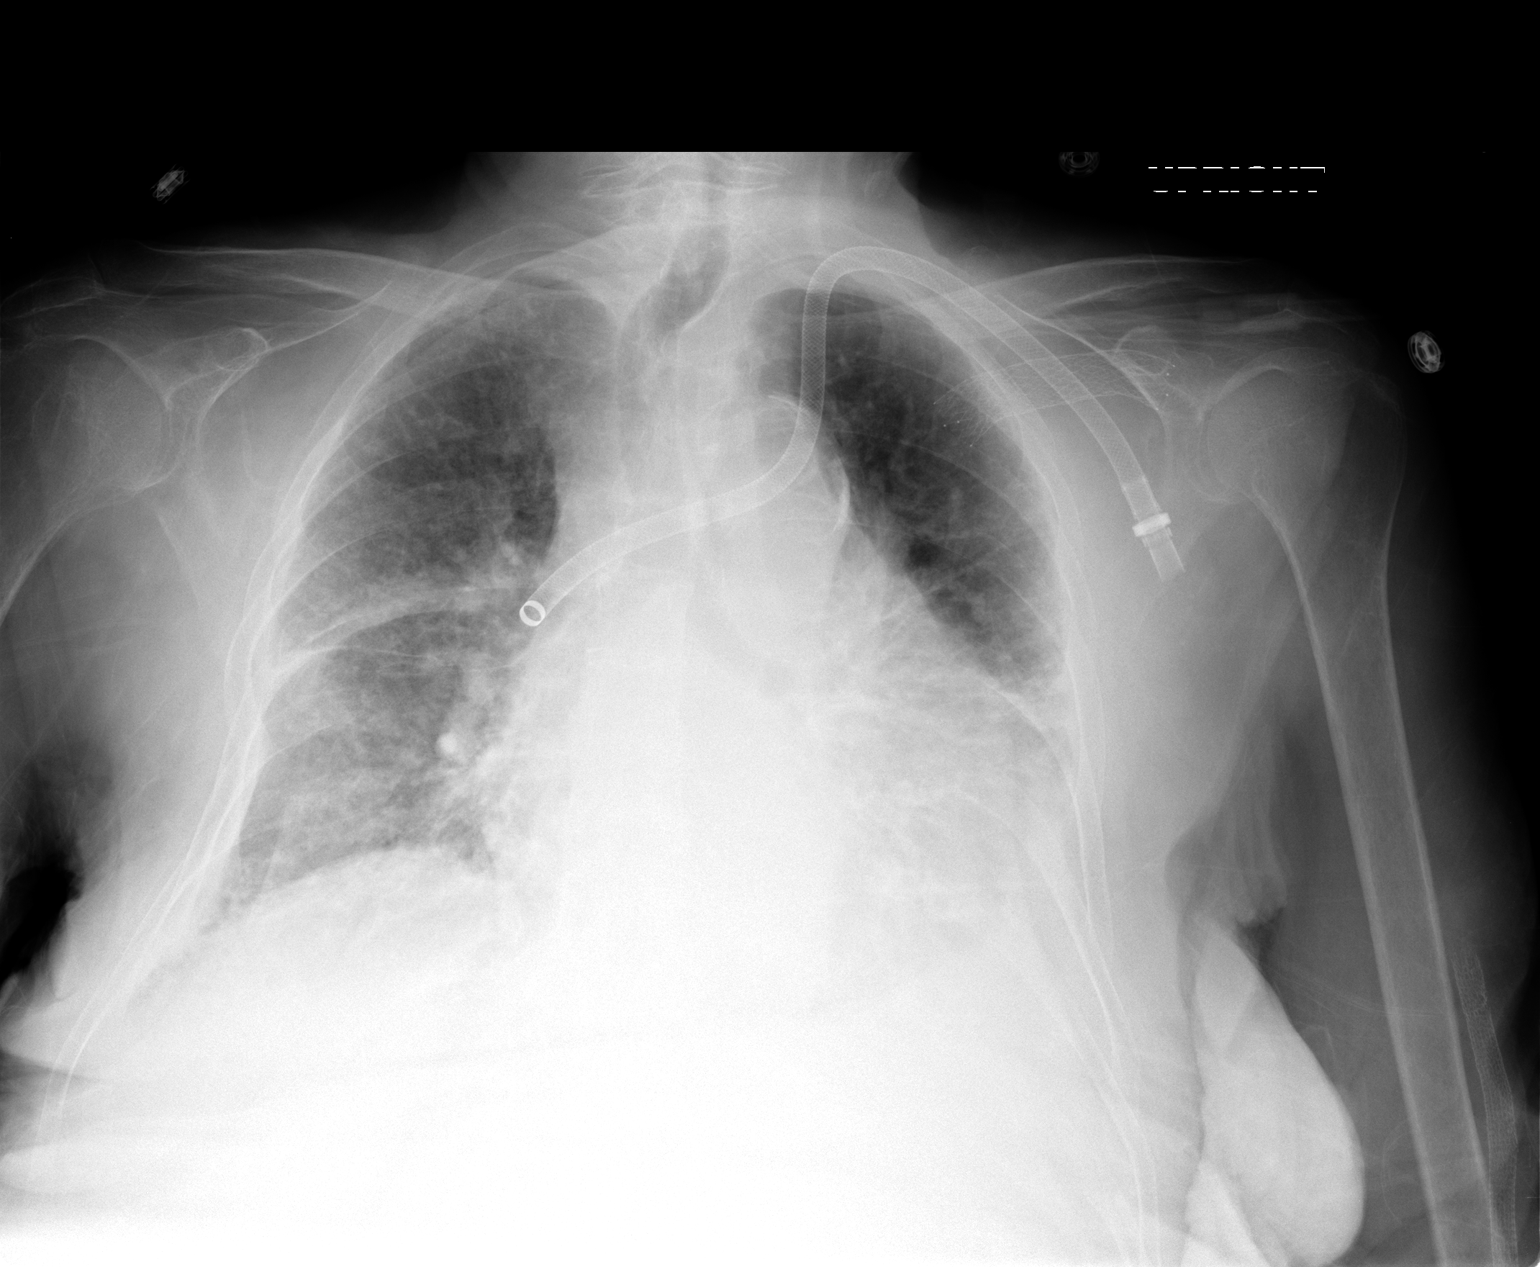

[view not recorded (2 of 2)]
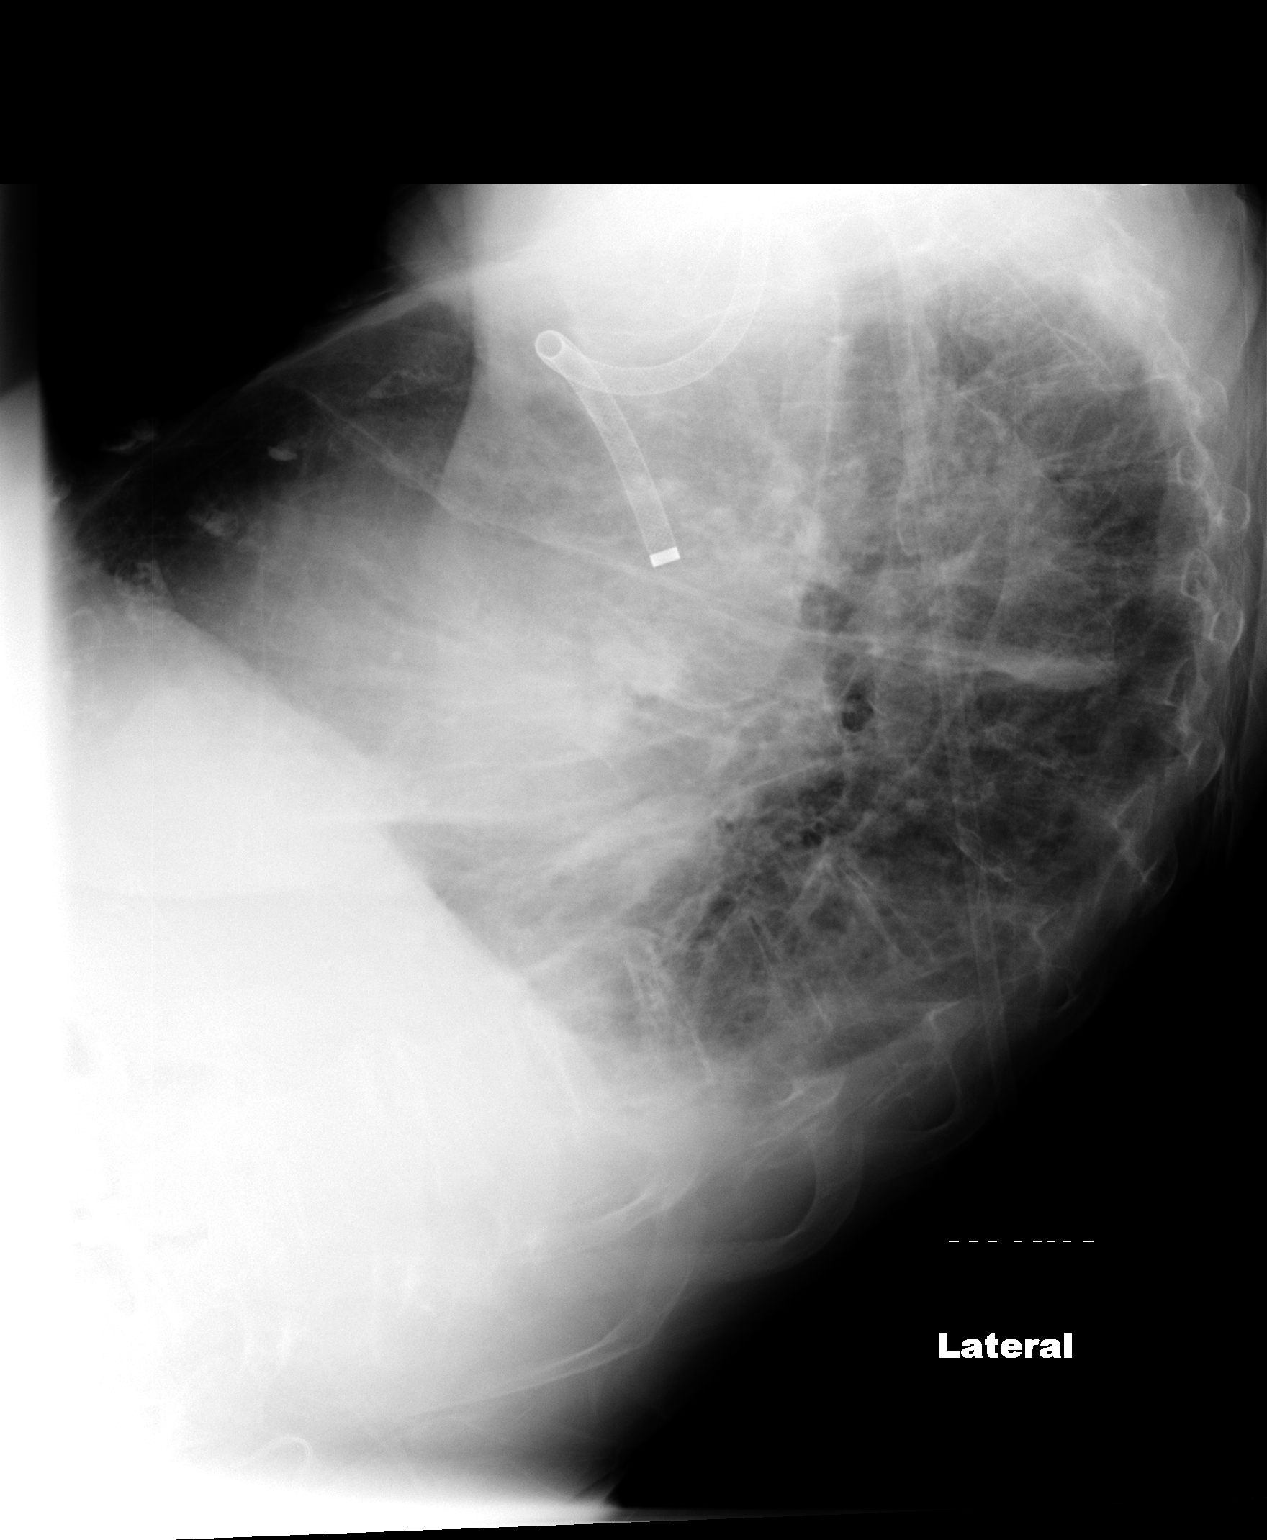

[2 of 2 positions shown; findings below may reference images not displayed]

FINDINGS: Left internal jugular catheter with tip terminating in the distal
superior vena cava. Left subclavian vein stent. Lung volumes are
low. There is cephalization of the pulmonary vasculature,
indistinctness of the interstitial markings, and patchy airspace
disease throughout the lungs bilaterally suggestive of moderate
pulmonary edema. Small bilateral pleural effusions. Mild
cardiomegaly. Upper mediastinal contours are distorted by patient
positioning. Atherosclerosis in the thoracic aorta.
IMPRESSION: 1. The appearance the chest suggests mild to moderate congestive
heart failure, as above.
2. Atherosclerosis.
3. Postprocedural changes and support apparatus, as above.

## 2016-05-24 ENCOUNTER — Other Ambulatory Visit (HOSPITAL_COMMUNITY)
Admission: RE | Admit: 2016-05-24 | Discharge: 2016-05-24 | Disposition: A | Discharge status: Home / Self Care | Payer: Medicare HMO | Source: Other Acute Inpatient Hospital | Attending: Nephrology | Admitting: Nephrology

## 2016-05-24 DIAGNOSIS — E875 Hyperkalemia: Secondary | ICD-10-CM | POA: Diagnosis present

## 2016-05-24 LAB — POTASSIUM: Potassium: 3.3 mmol/L — ABNORMAL LOW (ref 3.5–5.1)

## 2016-05-25 ENCOUNTER — Encounter: Payer: Self-pay | Admitting: *Deleted

## 2016-05-25 ENCOUNTER — Encounter
Admission: RE | Disposition: A | Discharge status: Home / Self Care | Payer: Self-pay | Source: Ambulatory Visit | Attending: Vascular Surgery

## 2016-05-25 ENCOUNTER — Ambulatory Visit
Admission: RE | Admit: 2016-05-25 | Discharge: 2016-05-25 | Disposition: A | Discharge status: Home / Self Care | Payer: Medicare HMO | Source: Ambulatory Visit | Attending: Vascular Surgery | Admitting: Vascular Surgery

## 2016-05-25 ENCOUNTER — Ambulatory Visit: Payer: Medicare HMO | Admitting: Anesthesiology

## 2016-05-25 DIAGNOSIS — Z992 Dependence on renal dialysis: Secondary | ICD-10-CM | POA: Insufficient documentation

## 2016-05-25 DIAGNOSIS — M1990 Unspecified osteoarthritis, unspecified site: Secondary | ICD-10-CM | POA: Diagnosis not present

## 2016-05-25 DIAGNOSIS — I5022 Chronic systolic (congestive) heart failure: Secondary | ICD-10-CM | POA: Diagnosis not present

## 2016-05-25 DIAGNOSIS — Z886 Allergy status to analgesic agent status: Secondary | ICD-10-CM | POA: Insufficient documentation

## 2016-05-25 DIAGNOSIS — Z9049 Acquired absence of other specified parts of digestive tract: Secondary | ICD-10-CM | POA: Insufficient documentation

## 2016-05-25 DIAGNOSIS — Z9889 Other specified postprocedural states: Secondary | ICD-10-CM | POA: Insufficient documentation

## 2016-05-25 DIAGNOSIS — Z79899 Other long term (current) drug therapy: Secondary | ICD-10-CM | POA: Insufficient documentation

## 2016-05-25 DIAGNOSIS — T82898A Other specified complication of vascular prosthetic devices, implants and grafts, initial encounter: Secondary | ICD-10-CM | POA: Diagnosis not present

## 2016-05-25 DIAGNOSIS — Y828 Other medical devices associated with adverse incidents: Secondary | ICD-10-CM | POA: Diagnosis not present

## 2016-05-25 DIAGNOSIS — N2581 Secondary hyperparathyroidism of renal origin: Secondary | ICD-10-CM | POA: Diagnosis not present

## 2016-05-25 DIAGNOSIS — J449 Chronic obstructive pulmonary disease, unspecified: Secondary | ICD-10-CM | POA: Insufficient documentation

## 2016-05-25 DIAGNOSIS — Z882 Allergy status to sulfonamides status: Secondary | ICD-10-CM | POA: Diagnosis not present

## 2016-05-25 DIAGNOSIS — N186 End stage renal disease: Secondary | ICD-10-CM | POA: Diagnosis not present

## 2016-05-25 DIAGNOSIS — I890 Lymphedema, not elsewhere classified: Secondary | ICD-10-CM | POA: Diagnosis not present

## 2016-05-25 DIAGNOSIS — L7632 Postprocedural hematoma of skin and subcutaneous tissue following other procedure: Secondary | ICD-10-CM | POA: Insufficient documentation

## 2016-05-25 DIAGNOSIS — F0390 Unspecified dementia without behavioral disturbance: Secondary | ICD-10-CM | POA: Diagnosis not present

## 2016-05-25 HISTORY — PX: HEMATOMA EVACUATION: SHX5118

## 2016-05-25 HISTORY — PX: CAPD INSERTION: SHX5233

## 2016-05-25 LAB — POCT I-STAT 4, (NA,K, GLUC, HGB,HCT)
Glucose, Bld: 77 mg/dL (ref 65–99)
HEMATOCRIT: 39 % (ref 36.0–46.0)
HEMOGLOBIN: 13.3 g/dL (ref 12.0–15.0)
Potassium: 4.9 mmol/L (ref 3.5–5.1)
Sodium: 139 mmol/L (ref 135–145)

## 2016-05-25 SURGERY — LAPAROSCOPIC INSERTION CONTINUOUS AMBULATORY PERITONEAL DIALYSIS  (CAPD) CATHETER
Anesthesia: General | Laterality: Right | Wound class: Clean Contaminated

## 2016-05-25 MED ORDER — PHENYLEPHRINE HCL 10 MG/ML IJ SOLN
INTRAMUSCULAR | Status: DC | PRN
  Administered 2016-05-25: 50 ug via INTRAVENOUS
  Administered 2016-05-25: 100 ug via INTRAVENOUS
  Administered 2016-05-25 (×2): 50 ug via INTRAVENOUS
  Administered 2016-05-25: 100 ug via INTRAVENOUS

## 2016-05-25 MED ORDER — ONDANSETRON HCL 4 MG/2ML IJ SOLN
INTRAMUSCULAR | Status: DC | PRN
  Administered 2016-05-25: 4 mg via INTRAVENOUS

## 2016-05-25 MED ORDER — OXYCODONE HCL 5 MG/5ML PO SOLN: 5.0000 mg | Freq: Once | ORAL | Status: DC | PRN

## 2016-05-25 MED ORDER — CEFAZOLIN SODIUM-DEXTROSE 2-4 GM/100ML-% IV SOLN
INTRAVENOUS | Status: AC
  Administered 2016-05-25: 2 g via INTRAVENOUS
  Filled 2016-05-25: qty 100

## 2016-05-25 MED ORDER — ROCURONIUM BROMIDE 100 MG/10ML IV SOLN
INTRAVENOUS | Status: DC | PRN
  Administered 2016-05-25: 10 mg via INTRAVENOUS
  Administered 2016-05-25: 20 mg via INTRAVENOUS

## 2016-05-25 MED ORDER — DEXAMETHASONE SODIUM PHOSPHATE 10 MG/ML IJ SOLN
INTRAMUSCULAR | Status: DC | PRN
  Administered 2016-05-25: 10 mg via INTRAVENOUS

## 2016-05-25 MED ORDER — SUGAMMADEX SODIUM 200 MG/2ML IV SOLN
INTRAVENOUS | Status: DC | PRN
  Administered 2016-05-25: 96.2 mg via INTRAVENOUS

## 2016-05-25 MED ORDER — HYDROCODONE-ACETAMINOPHEN 5-325 MG PO TABS: 1.0000 | ORAL_TABLET | Freq: Four times a day (QID) | ORAL | Status: DC | PRN

## 2016-05-25 MED ORDER — CEFAZOLIN SODIUM-DEXTROSE 2-4 GM/100ML-% IV SOLN
2.0000 g | INTRAVENOUS | Status: AC
  Administered 2016-05-25: 2 g via INTRAVENOUS

## 2016-05-25 MED ORDER — SODIUM CHLORIDE 0.9 % IV SOLN
INTRAVENOUS | Status: DC
  Administered 2016-05-25 (×2): via INTRAVENOUS

## 2016-05-25 MED ORDER — CHLORHEXIDINE GLUCONATE CLOTH 2 % EX PADS
6.0000 | MEDICATED_PAD | Freq: Once | CUTANEOUS | Status: AC
  Administered 2016-05-25: 6 via TOPICAL

## 2016-05-25 MED ORDER — BUPIVACAINE-EPINEPHRINE (PF) 0.25% -1:200000 IJ SOLN
INTRAMUSCULAR | Status: AC
  Filled 2016-05-25: qty 30

## 2016-05-25 MED ORDER — IPRATROPIUM-ALBUTEROL 0.5-2.5 (3) MG/3ML IN SOLN
RESPIRATORY_TRACT | Status: AC
  Administered 2016-05-25: 3 mL via RESPIRATORY_TRACT
  Filled 2016-05-25: qty 3

## 2016-05-25 MED ORDER — OXYCODONE HCL 5 MG PO TABS
5.0000 mg | ORAL_TABLET | Freq: Once | ORAL | Status: DC | PRN
Start: 2016-05-25 — End: 2016-05-25

## 2016-05-25 MED ORDER — BUPIVACAINE-EPINEPHRINE (PF) 0.25% -1:200000 IJ SOLN
INTRAMUSCULAR | Status: DC | PRN
  Administered 2016-05-25: 30 mL

## 2016-05-25 MED ORDER — FAMOTIDINE 20 MG PO TABS
20.0000 mg | ORAL_TABLET | Freq: Once | ORAL | Status: AC
  Administered 2016-05-25: 20 mg via ORAL

## 2016-05-25 MED ORDER — LIDOCAINE 2% (20 MG/ML) 5 ML SYRINGE
INTRAMUSCULAR | Status: DC | PRN
  Administered 2016-05-25: 80 mg via INTRAVENOUS

## 2016-05-25 MED ORDER — PROPOFOL 10 MG/ML IV BOLUS
INTRAVENOUS | Status: DC | PRN
  Administered 2016-05-25: 80 mg via INTRAVENOUS

## 2016-05-25 MED ORDER — IPRATROPIUM-ALBUTEROL 0.5-2.5 (3) MG/3ML IN SOLN
3.0000 mL | Freq: Once | RESPIRATORY_TRACT | Status: AC
  Administered 2016-05-25: 3 mL via RESPIRATORY_TRACT

## 2016-05-25 MED ORDER — EPHEDRINE SULFATE 50 MG/ML IJ SOLN
INTRAMUSCULAR | Status: DC | PRN
  Administered 2016-05-25: 10 mg via INTRAVENOUS

## 2016-05-25 MED ORDER — FENTANYL CITRATE (PF) 100 MCG/2ML IJ SOLN: 25.0000 ug | INTRAMUSCULAR | Status: DC | PRN

## 2016-05-25 MED ORDER — FAMOTIDINE 20 MG PO TABS
ORAL_TABLET | ORAL | Status: AC
  Administered 2016-05-25: 20 mg via ORAL
  Filled 2016-05-25: qty 1

## 2016-05-25 MED ORDER — FENTANYL CITRATE (PF) 100 MCG/2ML IJ SOLN
INTRAMUSCULAR | Status: DC | PRN
  Administered 2016-05-25: 25 ug via INTRAVENOUS
  Administered 2016-05-25 (×2): 50 ug via INTRAVENOUS

## 2016-05-25 SURGICAL SUPPLY — 45 items
ADAPTER BETA CAP QUINTON DIALY (ADAPTER) ×4 IMPLANT
ADAPTER CATH DIALYSIS 18.75 (CATHETERS) IMPLANT
ADAPTER CATH DIALYSIS 18.75CM (CATHETERS)
BLADE SURG 15 STRL LF DISP TIS (BLADE) ×2 IMPLANT
BLADE SURG 15 STRL SS (BLADE) ×2
CANISTER SUCT 1200ML W/VALVE (MISCELLANEOUS) ×4 IMPLANT
CATH DLYS SWAN NECK 62.5CM (CATHETERS) IMPLANT
CHLORAPREP W/TINT 26ML (MISCELLANEOUS) ×4 IMPLANT
DRAPE EXTREMITY 106X87X128.5 (DRAPES) ×4 IMPLANT
DRAPE SHEET LG 3/4 BI-LAMINATE (DRAPES) ×8 IMPLANT
DRESSING SURGICEL FIBRLLR 1X2 (HEMOSTASIS) ×2 IMPLANT
DRSG SURGICEL FIBRILLAR 1X2 (HEMOSTASIS) ×4
ELECT REM PT RETURN 9FT ADLT (ELECTROSURGICAL) ×4
ELECTRODE REM PT RTRN 9FT ADLT (ELECTROSURGICAL) ×2 IMPLANT
GAUZE SPONGE 4X4 12PLY STRL (GAUZE/BANDAGES/DRESSINGS) ×4 IMPLANT
GLOVE BIO SURGEON STRL SZ7 (GLOVE) ×32 IMPLANT
GLOVE INDICATOR 7.5 STRL GRN (GLOVE) ×12 IMPLANT
GLOVE SURG SYN 8.0 (GLOVE) ×4 IMPLANT
GOWN STRL REUS W/ TWL LRG LVL3 (GOWN DISPOSABLE) ×10 IMPLANT
GOWN STRL REUS W/ TWL XL LVL3 (GOWN DISPOSABLE) ×4 IMPLANT
GOWN STRL REUS W/TWL LRG LVL3 (GOWN DISPOSABLE) ×10
GOWN STRL REUS W/TWL XL LVL3 (GOWN DISPOSABLE) ×4
KIT RM TURNOVER STRD PROC AR (KITS) ×4 IMPLANT
LABEL OR SOLS (LABEL) ×4 IMPLANT
LIQUID BAND (GAUZE/BANDAGES/DRESSINGS) ×8 IMPLANT
MINICAP W/POVIDONE IODINE SOL (MISCELLANEOUS) ×4 IMPLANT
NEEDLE HYPO 25X1 1.5 SAFETY (NEEDLE) ×4 IMPLANT
NEEDLE INSUFFLATION 150MM (ENDOMECHANICALS) ×4 IMPLANT
NS IRRIG 500ML POUR BTL (IV SOLUTION) ×4 IMPLANT
PACK LAP CHOLECYSTECTOMY (MISCELLANEOUS) ×4 IMPLANT
PENCIL ELECTRO HAND CTR (MISCELLANEOUS) ×8 IMPLANT
SET TRANSFER 6 W/TWIST CLAMP 5 (SET/KITS/TRAYS/PACK) ×4 IMPLANT
SLEEVE ENDOPATH XCEL 5M (ENDOMECHANICALS) IMPLANT
SPONGE LAP 18X18 5 PK (GAUZE/BANDAGES/DRESSINGS) ×8 IMPLANT
SUT MNCRL AB 4-0 PS2 18 (SUTURE) ×4 IMPLANT
SUT MNCRL+ 5-0 UNDYED PC-3 (SUTURE) ×2 IMPLANT
SUT MONOCRYL 5-0 (SUTURE) ×2
SUT SILK 0 (SUTURE) ×2
SUT SILK 0 30XBRD TIE 6 (SUTURE) ×2 IMPLANT
SUT VIC AB 3-0 SH 27 (SUTURE) ×8
SUT VIC AB 3-0 SH 27X BRD (SUTURE) ×8 IMPLANT
SUT VICRYL 0 AB UR-6 (SUTURE) ×8 IMPLANT
SYR 50ML LL SCALE MARK (SYRINGE) ×4 IMPLANT
TROCAR XCEL NON-BLD 5MMX100MML (ENDOMECHANICALS) ×4 IMPLANT
TUBING INSUFFLATOR HI FLOW (MISCELLANEOUS) ×4 IMPLANT

## 2016-05-25 NOTE — Op Note (Signed)
OPERATIVE NOTE   PROCEDURE: 1. Laparoscopic peritoneal dialysis catheter placement. 2. Evacuation of deep hematoma right upper arm  PRE-OPERATIVE DIAGNOSIS: 1. End-stage renal disease requiring dialysis 2. Complication of dialysis device with massive hematoma right arm  POST-OPERATIVE DIAGNOSIS: Same  SURGEON: Tyronda Vizcarrondo, Latina CraverGregory [name]  ASSISTANT(S): None  ANESTHESIA: general  ESTIMATED BLOOD LOSS: Minimal   FINDING(S): 1. None  SPECIMEN(S): None  INDICATIONS:  [name] is a 82 y.o. female who presents with a massive hematoma of the right arm which she sustained while on dialysis. The patient's family has decided to do peritoneal dialysis for his long-term dialysis. Risks and benefits of placement were discussed and he is agreeable to proceed.  Differences between peritoneal dialysis and hemodialysis were discussed.    DESCRIPTION:  After obtaining full informed written consent, the patient was brought back to the operating room and placed supine upon the operating table. The patient received IV antibiotics prior to induction. After obtaining adequate anesthesia, the abdomen was prepped and draped in the standard fashion.   A small vertical incision was made in the midline just above the umbilicus and the dissection was carried down through the soft tissue to expose the fascia. Two 0 Vicryl sutures were then used to secure the fascia just lateral to the midline on both sides and an 15 blade was used to incise the fascia. The fascia is then elevated with a bone hook Varies needle is introduced and a saline test was performed indicating intraperitoneal location and insufflated of the abdomen with carbon dioxide is performed to 15 mmHg pressure. A 5 mm trocar is then placed again maintaining elevation of the fascia with a bone hook.  A oblique incision is then made in the left lower quadrant to the medial portion of the rectus near the umbilicus muscle the dissection is carried  down through the soft tissues and the fascia is exposed. Small incision is made in the fascia with the Bovie and a pursestring suture of 0 Vicryl is placed.   Seldinger needle was then inserted under direct visualization and the wire introduced under the view of the camera.  Wire was then advanced under direct view into the left lower quadrant. The dilator and peel-away sheath were then advanced over the wire and the catheter was advanced into the abdominal cavity over the stylette. Under direct visualization the catheters curled portion was positioned deep into the pelvis just to the left of midline. It was irrigated with heparinized saline. The deep cuff was secured to the fascial pursestring suture. A small counterincision was made in the left lower quadrant of the abdomen and the catheter was brought out this site. The appropriate distal connectors were placed, and I then placed 300 cc of saline through the catheter into the pelvis. The abdomen was desufflated. Immediately, 250 cc of effluent returned through the catheter when the bag was placed to gravity. I took one more look with the camera to ensure that the catheter was in the pelvis and it was. The 5 mm trocar was then removed. I then closed the incisions with a 2-0 Vicryl in the right lower quadrant incision and subsequently 3-0 Vicryl and 4-0 Monocryl, the other incisions were closed with 3-0 Vicryl and 4-0 Monocryl and placed Dermabond as dressing. Dry dressing was placed around the catheter exit site.   The patient is then reprepped and redraped prepping the right arm and placing a palm up and extended fashion. Timeout is then once again called. Quarter percent  Marcaine with epinephrine is then infiltrated into the soft tissues and skin overlying the massive hematoma and a linear incision is created. Proximally 250-300 cc of thrombus is then removed once the hematoma cavity is entered sharply with a 15 blade scalpel. Muscle fibers were exposed  indicating the hematoma was subfascial.  An entire liter of warm saline was then used to irrigate the cavity. The Marcaine was then infiltrated into the skin in an elliptical fashion surrounding the original linear incision. 15 blade scalpel was then used to remove skin and some of the remaining soft tissue so that a closure could be achieved with a marked reduction in the dead space. Bleeding was then controlled with Bovie cautery fibrillar was placed in the bed of the wound along the muscle fibers the hematoma was then closed in 4 layers using interrupted 3-0 Vicryl for 3 deep layers followed by 4-0 Monocryl subcuticular for the skin. This essentially eliminated the majority of any residual deep space. Sterile dressing was applied.  The patient was then awakened from anesthesia and taken to the recovery room in stable condition having tolerated the procedure well.   COMPLICATIONS: None  CONDITION: None  [name], [name] 05/25/2016, 9:52 AM

## 2016-05-25 NOTE — Discharge Instructions (Signed)

## 2016-05-25 NOTE — Transfer of Care (Signed)
Immediate Anesthesia Transfer of Care Note  Patient: Luvenia StarchJean I Embleton  Procedure(s) Performed: Procedure(s): LAPAROSCOPIC INSERTION CONTINUOUS AMBULATORY PERITONEAL DIALYSIS  (CAPD) CATHETER (N/A) EVACUATION HEMATOMA ( DRAINAGE ) (Right)  Patient Location: PACU  Anesthesia Type:General  Level of Consciousness: awake, alert  and oriented  Airway & Oxygen Therapy: Patient Spontanous Breathing and Patient connected to nasal cannula oxygen  Post-op Assessment: Report given to RN and Post -op Vital signs reviewed and stable  Post vital signs: Reviewed and stable  Last Vitals:  Filed Vitals:   05/25/16 0629  BP: 116/50  Pulse: 54  Temp: 35.6 C  Resp: 16    Last Pain:  Filed Vitals:   05/25/16 0633  PainSc: 0-No pain         Complications: No apparent anesthesia complications

## 2016-05-25 NOTE — Anesthesia Postprocedure Evaluation (Signed)
Anesthesia Post Note  Patient: [name]  Procedure(s) Performed: Procedure(s) (LRB): LAPAROSCOPIC INSERTION CONTINUOUS AMBULATORY PERITONEAL DIALYSIS  (CAPD) CATHETER (N/A) EVACUATION HEMATOMA ( DRAINAGE ) (Right)  Patient location during evaluation: PACU Anesthesia Type: General Level of consciousness: awake and alert Pain management: pain level controlled Vital Signs Assessment: post-procedure vital signs reviewed and stable Respiratory status: spontaneous breathing, nonlabored ventilation, respiratory function stable and patient connected to nasal cannula oxygen Cardiovascular status: blood pressure returned to baseline and stable Postop Assessment: no signs of nausea or vomiting Anesthetic complications: no    Last Vitals:  Filed Vitals:   05/25/16 1057 05/25/16 1118  BP: 97/35 102/32  Pulse: 58 58  Temp: 36.4 C 36.4 C  Resp: 14 16    Last Pain:  Filed Vitals:   05/25/16 1119  PainSc: 0-No pain                 Cleda MccreedyJoseph K Teryn Boerema

## 2016-05-25 NOTE — Anesthesia Procedure Notes (Signed)
Procedure Name: Intubation Date/Time: 05/25/2016 7:55 AM Performed by: Paulette BlanchPARAS, Meiko Ives Pre-anesthesia Checklist: Patient identified, Patient being monitored, Timeout performed, Emergency Drugs available and Suction available Patient Re-evaluated:Patient Re-evaluated prior to inductionOxygen Delivery Method: Circle system utilized Preoxygenation: Pre-oxygenation with 100% oxygen Intubation Type: IV induction Ventilation: Mask ventilation without difficulty Laryngoscope Size: 3 and Miller Grade View: Grade I Tube type: Oral Tube size: 7.0 mm Number of attempts: 1 Airway Equipment and Method: Stylet Placement Confirmation: ETT inserted through vocal cords under direct vision,  positive ETCO2 and breath sounds checked- equal and bilateral Secured at: 21 cm Tube secured with: Tape Dental Injury: Teeth and Oropharynx as per pre-operative assessment

## 2016-05-25 NOTE — H&P (Signed)
[facility] History & Physical Update  The patient was interviewed and re-examined.  The patient's previous History and Physical has been reviewed and is unchanged.  There is no change in the plan of care. We plan to proceed with the scheduled procedure.  Curtisha Bendix, Latina CraverGregory G, MD  05/25/2016, 7:46 AM

## 2016-05-25 NOTE — Anesthesia Preprocedure Evaluation (Addendum)
Anesthesia Evaluation  Patient identified by MRN, date of birth, ID band Patient confused    Reviewed: Allergy & Precautions, H&P , NPO status , Patient's Chart, lab work & pertinent test results  History of Anesthesia Complications (+) PONV and history of anesthetic complications  Airway Mallampati: III  TM Distance: <3 FB Neck ROM: limited    Dental  (+) Poor Dentition, Chipped   Pulmonary shortness of breath, with exertion and Long-Term Oxygen Therapy, pneumonia, COPD,    Pulmonary exam normal breath sounds clear to auscultation       Cardiovascular Exercise Tolerance: Poor (-) angina+ CAD, + Past MI, +CHF and + DOE  Normal cardiovascular exam Rhythm:regular Rate:Normal     Neuro/Psych PSYCHIATRIC DISORDERS negative neurological ROS     GI/Hepatic negative GI ROS, Neg liver ROS,   Endo/Other  negative endocrine ROS  Renal/GU DialysisRenal disease  negative genitourinary   Musculoskeletal  (+) Arthritis ,   Abdominal   Peds  Hematology negative hematology ROS (+)   Anesthesia Other Findings Past Medical History:   COPD (chronic obstructive pulmonary disease) (*                Comment:a. on home O2   History of pneumonia                            05/2013       History of blood transfusion                                   Comment:a. after fistula ruptured   Dementia                                                     Chronic systolic CHF (congestive heart failure*                Comment:a. echo 06/14/14: EF 20-25%, diffuse HK, AK of               mid-apicalinferolateral myocardium, DD, PASP 44              mm Hg; b. echo 11/2014 EF 30-35%, diastolic               dysfunction, global HK, unable to exclude RWMA,              mod MR; c. 01/2015 Echo: EF 45-50%, mild diff               HK, gr1 DD, triv AI, mild MR, mildly dil LA, nl              RV/PASP.   Presumed CAD                                                    Comment:a. presumed CAD - patient's son has declined               cath/ischemic eval to date   Secondary hyperparathyroidism (HCC)  Shortness of breath dyspnea                                    Comment:doe   ESRD (end stage renal disease) (HCC)                           Comment:a. HD MWF; b. multiple fistula revisions now               using permacath.   Arthritis                                                    Anemia                                                       Complication of anesthesia                                   PONV (postoperative nausea and vomiting)                    Past Surgical History:   DIALYSIS FISTULA CREATION                                     LIGATION OF ARTERIOVENOUS  FISTULA              Left 06/04/2013      Comment:Procedure: LIGATION OF ARTERIOVENOUS  FISTULA;               Surgeon: Fransisco Hertz, MD;  Location: [facility] OR;                Service: Vascular;  Laterality: Left;   INSERTION OF DIALYSIS CATHETER                  Right 06/04/2013      Comment:Procedure: INSERTION OF DIALYSIS CATHETER;                Surgeon: Fransisco Hertz, MD;  Location: [facility] OR;                Service: Vascular;  Laterality: Right;   CHOLECYSTECTOMY                                  1992         AV FISTULA PLACEMENT                            Right 07/07/2013      Comment:Procedure: ARTERIOVENOUS (AV) FISTULA CREATION               - RIGHT BRACHIAL CEPHALIC ;  Surgeon: Fransisco Hertz, MD;  Location: [facility] OR;  Service: Vascular;  Laterality: Right;   REMOVAL OF A DIALYSIS CATHETER                  Right 11/09/2013     Comment:Procedure: REMOVAL OF A DIALYSIS CATHETER-               Right TDC ;  Surgeon: Fransisco Hertz, MD;                Location: [facility] OR;  Service: Vascular;                Laterality: Right;   INSERTION OF DIALYSIS CATHETER                  Left 11/09/2013     Comment:Procedure: INSERTION OF DIALYSIS CATHETER-  Left              TDC;  Surgeon: Fransisco Hertz, MD;  Location: [facility]               OR;  Service: Vascular;  Laterality: Left;   EXCHANGE OF A DIALYSIS CATHETER                 Left 12/17/2013      Comment:Procedure: EXCHANGE OF A DIALYSIS CATHETER;                Surgeon: Chuck Hint, MD;  Location:               [facility] OR;  Service: Vascular;  Laterality: Left;   FISTULOGRAM                                     Right 12/17/2013      Comment:Procedure: FISTULOGRAM- RIGHT ARM- POSSIBLE               INTERVENTION;  Surgeon: Chuck Hint,               MD;  Location: [facility] OR;  Service: Vascular;                Laterality: Right;   LIGATION OF COMPETING BRANCHES OF ARTERIOVENOU* Right 12/17/2013      Comment:Procedure: LIGATION OF COMPETING BRANCHES OF               ARTERIOVENOUS FISTULA;  Surgeon: Chuck Hint, MD;  Location: [facility] OR;  Service:               Vascular;  Laterality: Right;   LIGATION OF ARTERIOVENOUS  FISTULA              Right 01/19/2014       Comment:Procedure: LIGATION OF ARTERIOVENOUS  FISTULA-               RIGHT BRACHIOCEPHALIC FISTULA;  Surgeon: Fransisco Hertz, MD;  Location: [facility] OR;  Service:               Vascular;  Laterality: Right;   SHUNTOGRAM                                      Right 09/10/2013     Comment:Procedure: Fistulogram;  Surgeon: Fransisco Hertz,  MD;  Location: [facility] CATH LAB;  Service:               Cardiovascular;  Laterality: Right;   PERIPHERAL VASCULAR CATHETERIZATION             N/A 06/28/2015       Comment:Procedure: Dialysis/Perma Catheter Insertion;                Surgeon: Renford DillsGregory G Schnier, MD;  Location: [facility]              INVASIVE CV LAB;  Service: Cardiovascular;                Laterality: N/A;   PERIPHERAL VASCULAR CATHETERIZATION             N/A 09/28/2015      Comment:Procedure: Dialysis/Perma Catheter Insertion;                Surgeon: Renford DillsGregory G Schnier, MD;  Location: [facility]               INVASIVE CV LAB;  Service: Cardiovascular;                Laterality: N/A;   PERIPHERAL VASCULAR CATHETERIZATION             N/A 01/03/2016      Comment:Procedure: Dialysis/Perma Catheter Insertion;                Surgeon: Renford DillsGregory G Schnier, MD;  Location: [facility]              INVASIVE CV LAB;  Service: Cardiovascular;                Laterality: N/A;   PERIPHERAL VASCULAR CATHETERIZATION             N/A 01/26/2016       Comment:Procedure: Dialysis/Perma Catheter Removal;                Surgeon: Annice NeedyJason S Dew, MD;  Location: [facility]               INVASIVE CV LAB;  Service: Cardiovascular;                Laterality: N/A;  BMI    Body Mass Index   18.78 kg/m 2      Reproductive/Obstetrics negative OB ROS                            Anesthesia Physical Anesthesia Plan  ASA: IV  Anesthesia Plan: General ETT   Post-op Pain Management:    Induction:   Airway Management Planned:   Additional Equipment:   Intra-op Plan:   Post-operative Plan:   Informed Consent: I have reviewed the patients History and Physical, chart, labs and discussed the procedure including the risks, benefits and alternatives for the proposed anesthesia with the patient or authorized representative who has indicated his/her understanding and acceptance.   Dental Advisory Given  Plan Discussed with: Anesthesiologist, CRNA and Surgeon  Anesthesia Plan Comments: (Patient and son informed that patient is higher risk for complications from anesthesia during this procedure due to their medical history.  They voiced understanding. )        Anesthesia Quick Evaluation

## 2016-07-03 ENCOUNTER — Other Ambulatory Visit (HOSPITAL_COMMUNITY): Payer: Self-pay | Admitting: Pulmonary Disease

## 2016-07-03 DIAGNOSIS — R1907 Generalized intra-abdominal and pelvic swelling, mass and lump: Secondary | ICD-10-CM

## 2016-07-05 ENCOUNTER — Ambulatory Visit (HOSPITAL_COMMUNITY): Admission: RE | Admit: 2016-07-05 | Payer: Medicare HMO | Source: Ambulatory Visit

## 2016-07-06 ENCOUNTER — Ambulatory Visit (HOSPITAL_COMMUNITY)
Admission: RE | Admit: 2016-07-06 | Discharge: 2016-07-06 | Disposition: A | Discharge status: Home / Self Care | Payer: Medicare HMO | Source: Ambulatory Visit | Attending: Pulmonary Disease | Admitting: Pulmonary Disease

## 2016-07-06 DIAGNOSIS — R1907 Generalized intra-abdominal and pelvic swelling, mass and lump: Secondary | ICD-10-CM

## 2016-07-06 DIAGNOSIS — N261 Atrophy of kidney (terminal): Secondary | ICD-10-CM | POA: Insufficient documentation

## 2016-07-06 DIAGNOSIS — Z9049 Acquired absence of other specified parts of digestive tract: Secondary | ICD-10-CM | POA: Insufficient documentation

## 2016-07-15 ENCOUNTER — Encounter (HOSPITAL_COMMUNITY): Payer: Self-pay | Admitting: *Deleted

## 2016-07-15 ENCOUNTER — Inpatient Hospital Stay (HOSPITAL_COMMUNITY)
Admission: EM | Admit: 2016-07-15 | Discharge: 2016-07-18 | DRG: 640 | Disposition: A | Discharge status: Home Health | Payer: Medicare HMO | Attending: Pulmonary Disease | Admitting: Pulmonary Disease

## 2016-07-15 ENCOUNTER — Emergency Department (HOSPITAL_COMMUNITY): Payer: Medicare HMO

## 2016-07-15 DIAGNOSIS — E875 Hyperkalemia: Secondary | ICD-10-CM | POA: Diagnosis present

## 2016-07-15 DIAGNOSIS — R0600 Dyspnea, unspecified: Secondary | ICD-10-CM | POA: Diagnosis present

## 2016-07-15 DIAGNOSIS — Z9981 Dependence on supplemental oxygen: Secondary | ICD-10-CM

## 2016-07-15 DIAGNOSIS — R7989 Other specified abnormal findings of blood chemistry: Secondary | ICD-10-CM | POA: Diagnosis present

## 2016-07-15 DIAGNOSIS — F0391 Unspecified dementia with behavioral disturbance: Secondary | ICD-10-CM

## 2016-07-15 DIAGNOSIS — E8889 Other specified metabolic disorders: Secondary | ICD-10-CM | POA: Diagnosis present

## 2016-07-15 DIAGNOSIS — E8770 Fluid overload, unspecified: Secondary | ICD-10-CM | POA: Diagnosis present

## 2016-07-15 DIAGNOSIS — I5022 Chronic systolic (congestive) heart failure: Secondary | ICD-10-CM | POA: Diagnosis present

## 2016-07-15 DIAGNOSIS — D631 Anemia in chronic kidney disease: Secondary | ICD-10-CM | POA: Diagnosis present

## 2016-07-15 DIAGNOSIS — Z7902 Long term (current) use of antithrombotics/antiplatelets: Secondary | ICD-10-CM | POA: Diagnosis not present

## 2016-07-15 DIAGNOSIS — Z992 Dependence on renal dialysis: Secondary | ICD-10-CM | POA: Diagnosis not present

## 2016-07-15 DIAGNOSIS — E8779 Other fluid overload: Secondary | ICD-10-CM

## 2016-07-15 DIAGNOSIS — D638 Anemia in other chronic diseases classified elsewhere: Secondary | ICD-10-CM | POA: Diagnosis present

## 2016-07-15 DIAGNOSIS — J449 Chronic obstructive pulmonary disease, unspecified: Secondary | ICD-10-CM | POA: Diagnosis present

## 2016-07-15 DIAGNOSIS — Z9049 Acquired absence of other specified parts of digestive tract: Secondary | ICD-10-CM

## 2016-07-15 DIAGNOSIS — I2510 Atherosclerotic heart disease of native coronary artery without angina pectoris: Secondary | ICD-10-CM | POA: Diagnosis present

## 2016-07-15 DIAGNOSIS — F0390 Unspecified dementia without behavioral disturbance: Secondary | ICD-10-CM | POA: Diagnosis present

## 2016-07-15 DIAGNOSIS — N186 End stage renal disease: Secondary | ICD-10-CM | POA: Diagnosis present

## 2016-07-15 DIAGNOSIS — I953 Hypotension of hemodialysis: Secondary | ICD-10-CM | POA: Diagnosis present

## 2016-07-15 DIAGNOSIS — Z8701 Personal history of pneumonia (recurrent): Secondary | ICD-10-CM | POA: Diagnosis not present

## 2016-07-15 DIAGNOSIS — R778 Other specified abnormalities of plasma proteins: Secondary | ICD-10-CM | POA: Diagnosis present

## 2016-07-15 DIAGNOSIS — D696 Thrombocytopenia, unspecified: Secondary | ICD-10-CM | POA: Diagnosis present

## 2016-07-15 DIAGNOSIS — I132 Hypertensive heart and chronic kidney disease with heart failure and with stage 5 chronic kidney disease, or end stage renal disease: Secondary | ICD-10-CM | POA: Diagnosis present

## 2016-07-15 LAB — CBC WITH DIFFERENTIAL/PLATELET
BASOS ABS: 0 10*3/uL (ref 0.0–0.1)
BASOS PCT: 0 %
EOS PCT: 3 %
Eosinophils Absolute: 0.2 10*3/uL (ref 0.0–0.7)
HEMATOCRIT: 37.2 % (ref 36.0–46.0)
Hemoglobin: 11.8 g/dL — ABNORMAL LOW (ref 12.0–15.0)
Lymphocytes Relative: 11 %
Lymphs Abs: 0.8 10*3/uL (ref 0.7–4.0)
MCH: 29.3 pg (ref 26.0–34.0)
MCHC: 31.7 g/dL (ref 30.0–36.0)
MCV: 92.3 fL (ref 78.0–100.0)
MONO ABS: 0.5 10*3/uL (ref 0.1–1.0)
Monocytes Relative: 7 %
NEUTROS ABS: 5.8 10*3/uL (ref 1.7–7.7)
Neutrophils Relative %: 79 %
PLATELETS: 130 10*3/uL — AB (ref 150–400)
RBC: 4.03 MIL/uL (ref 3.87–5.11)
RDW: 15.6 % — AB (ref 11.5–15.5)
WBC: 7.3 10*3/uL (ref 4.0–10.5)

## 2016-07-15 LAB — BASIC METABOLIC PANEL
ANION GAP: 22 — AB (ref 5–15)
BUN: 137 mg/dL — ABNORMAL HIGH (ref 6–20)
CALCIUM: 9 mg/dL (ref 8.9–10.3)
CO2: 18 mmol/L — AB (ref 22–32)
Chloride: 99 mmol/L — ABNORMAL LOW (ref 101–111)
Creatinine, Ser: 10.76 mg/dL — ABNORMAL HIGH (ref 0.44–1.00)
GFR calc non Af Amer: 3 mL/min — ABNORMAL LOW (ref >60)
GFR, EST AFRICAN AMERICAN: 3 mL/min — AB (ref >60)
Glucose, Bld: 99 mg/dL (ref 65–99)
POTASSIUM: 6.7 mmol/L — AB (ref 3.5–5.1)
Sodium: 139 mmol/L (ref 135–145)

## 2016-07-15 LAB — LACTIC ACID, PLASMA
LACTIC ACID, VENOUS: 1.1 mmol/L (ref 0.5–1.9)
LACTIC ACID, VENOUS: 0.7 mmol/L (ref 0.5–1.9)

## 2016-07-15 LAB — BRAIN NATRIURETIC PEPTIDE: B Natriuretic Peptide: 2033 pg/mL — ABNORMAL HIGH (ref 0.0–100.0)

## 2016-07-15 LAB — TROPONIN I
TROPONIN I: 0.05 ng/mL — AB (ref <0.03)
Troponin I: 0.04 ng/mL (ref <0.03)

## 2016-07-15 LAB — MRSA PCR SCREENING: MRSA by PCR: NEGATIVE

## 2016-07-15 MED ORDER — SEVELAMER CARBONATE 2.4 G PO PACK
2.4000 g | PACK | Freq: Three times a day (TID) | ORAL | Status: DC
  Administered 2016-07-16 – 2016-07-17 (×3): 2.4 g via ORAL
  Filled 2016-07-15 (×14): qty 1

## 2016-07-15 MED ORDER — INSULIN ASPART 100 UNIT/ML ~~LOC~~ SOLN
10.0000 [IU] | Freq: Once | SUBCUTANEOUS | Status: AC
  Administered 2016-07-15: 10 [IU] via INTRAVENOUS
  Filled 2016-07-15: qty 1

## 2016-07-15 MED ORDER — HEPARIN SODIUM (PORCINE) 1000 UNIT/ML DIALYSIS
1000.0000 [IU] | INTRAMUSCULAR | Status: DC | PRN
  Filled 2016-07-15: qty 1

## 2016-07-15 MED ORDER — SODIUM CHLORIDE 0.9% FLUSH
3.0000 mL | Freq: Two times a day (BID) | INTRAVENOUS | Status: DC
  Administered 2016-07-15 – 2016-07-17 (×3): 3 mL via INTRAVENOUS

## 2016-07-15 MED ORDER — HEPARIN SODIUM (PORCINE) 1000 UNIT/ML DIALYSIS
20.0000 [IU]/kg | INTRAMUSCULAR | Status: DC | PRN
  Administered 2016-07-15: 1100 [IU] via INTRAVENOUS_CENTRAL
  Filled 2016-07-15 (×2): qty 2

## 2016-07-15 MED ORDER — ACETAMINOPHEN 325 MG PO TABS: 650.0000 mg | ORAL_TABLET | Freq: Four times a day (QID) | ORAL | Status: DC | PRN

## 2016-07-15 MED ORDER — TORSEMIDE 20 MG PO TABS
20.0000 mg | ORAL_TABLET | Freq: Every day | ORAL | Status: DC
  Administered 2016-07-16 – 2016-07-18 (×2): 20 mg via ORAL
  Filled 2016-07-15 (×3): qty 1

## 2016-07-15 MED ORDER — RENA-VITE PO TABS
1.0000 | ORAL_TABLET | Freq: Every day | ORAL | Status: DC
  Administered 2016-07-16 – 2016-07-18 (×2): 1 via ORAL
  Filled 2016-07-15 (×3): qty 1

## 2016-07-15 MED ORDER — LIDOCAINE HCL (PF) 1 % IJ SOLN: 5.0000 mL | INTRAMUSCULAR | Status: DC | PRN

## 2016-07-15 MED ORDER — POLYETHYLENE GLYCOL 3350 17 G PO PACK: 17.0000 g | PACK | Freq: Every day | ORAL | Status: DC | PRN

## 2016-07-15 MED ORDER — PENTAFLUOROPROP-TETRAFLUOROETH EX AERO
1.0000 "application " | INHALATION_SPRAY | CUTANEOUS | Status: DC | PRN
  Filled 2016-07-15: qty 30

## 2016-07-15 MED ORDER — SODIUM CHLORIDE 0.9% FLUSH
3.0000 mL | Freq: Two times a day (BID) | INTRAVENOUS | Status: DC
  Administered 2016-07-16 – 2016-07-17 (×2): 3 mL via INTRAVENOUS

## 2016-07-15 MED ORDER — HEPARIN SODIUM (PORCINE) 5000 UNIT/ML IJ SOLN
5000.0000 [IU] | Freq: Three times a day (TID) | INTRAMUSCULAR | Status: DC
  Administered 2016-07-15 – 2016-07-18 (×7): 5000 [IU] via SUBCUTANEOUS
  Filled 2016-07-15 (×7): qty 1

## 2016-07-15 MED ORDER — CARVEDILOL 3.125 MG PO TABS
3.1250 mg | ORAL_TABLET | Freq: Two times a day (BID) | ORAL | Status: DC
  Administered 2016-07-16 – 2016-07-18 (×5): 3.125 mg via ORAL
  Filled 2016-07-15 (×5): qty 1

## 2016-07-15 MED ORDER — DEXTROSE 50 % IV SOLN
INTRAVENOUS | Status: AC
  Filled 2016-07-15: qty 50

## 2016-07-15 MED ORDER — DEXTROSE 50 % IV SOLN
50.0000 mL | Freq: Once | INTRAVENOUS | Status: AC
  Administered 2016-07-15: 50 mL via INTRAVENOUS

## 2016-07-15 MED ORDER — ALPRAZOLAM 0.5 MG PO TABS
0.5000 mg | ORAL_TABLET | Freq: Three times a day (TID) | ORAL | Status: DC | PRN
  Administered 2016-07-15 – 2016-07-17 (×3): 0.5 mg via ORAL
  Filled 2016-07-15 (×3): qty 1

## 2016-07-15 MED ORDER — SODIUM CHLORIDE 0.9 % IV SOLN: 250.0000 mL | INTRAVENOUS | Status: DC | PRN

## 2016-07-15 MED ORDER — ONDANSETRON HCL 4 MG PO TABS: 4.0000 mg | ORAL_TABLET | Freq: Four times a day (QID) | ORAL | Status: DC | PRN

## 2016-07-15 MED ORDER — HYDROCODONE-ACETAMINOPHEN 5-325 MG PO TABS
1.0000 | ORAL_TABLET | ORAL | Status: DC | PRN
  Administered 2016-07-16 – 2016-07-17 (×2): 1 via ORAL
  Filled 2016-07-15 (×2): qty 1

## 2016-07-15 MED ORDER — ACETAMINOPHEN 650 MG RE SUPP: 650.0000 mg | Freq: Four times a day (QID) | RECTAL | Status: DC | PRN

## 2016-07-15 MED ORDER — SODIUM CHLORIDE 0.9% FLUSH: 3.0000 mL | INTRAVENOUS | Status: DC | PRN

## 2016-07-15 MED ORDER — BISACODYL 5 MG PO TBEC: 5.0000 mg | DELAYED_RELEASE_TABLET | Freq: Every day | ORAL | Status: DC | PRN

## 2016-07-15 MED ORDER — DONEPEZIL HCL 5 MG PO TABS
10.0000 mg | ORAL_TABLET | Freq: Every day | ORAL | Status: DC
  Administered 2016-07-16 – 2016-07-17 (×2): 10 mg via ORAL
  Filled 2016-07-15 (×2): qty 2

## 2016-07-15 MED ORDER — CLOPIDOGREL BISULFATE 75 MG PO TABS
75.0000 mg | ORAL_TABLET | Freq: Every day | ORAL | Status: DC
  Administered 2016-07-16 – 2016-07-18 (×2): 75 mg via ORAL
  Filled 2016-07-15 (×3): qty 1

## 2016-07-15 MED ORDER — MEMANTINE HCL ER 28 MG PO CP24
28.0000 mg | ORAL_CAPSULE | Freq: Every day | ORAL | Status: DC
  Administered 2016-07-16 – 2016-07-18 (×2): 28 mg via ORAL
  Filled 2016-07-15 (×5): qty 1

## 2016-07-15 MED ORDER — LIDOCAINE-PRILOCAINE 2.5-2.5 % EX CREA
1.0000 "application " | TOPICAL_CREAM | CUTANEOUS | Status: DC | PRN
  Filled 2016-07-15: qty 5

## 2016-07-15 MED ORDER — SODIUM CHLORIDE 0.9 % IV SOLN: 100.0000 mL | INTRAVENOUS | Status: DC | PRN

## 2016-07-15 MED ORDER — ALBUTEROL (5 MG/ML) CONTINUOUS INHALATION SOLN
10.0000 mg/h | INHALATION_SOLUTION | RESPIRATORY_TRACT | Status: DC
  Administered 2016-07-15: 10 mg/h via RESPIRATORY_TRACT
  Filled 2016-07-15: qty 20

## 2016-07-15 MED ORDER — ALBUTEROL SULFATE (2.5 MG/3ML) 0.083% IN NEBU: 10.0000 mg | INHALATION_SOLUTION | Freq: Once | RESPIRATORY_TRACT | Status: DC

## 2016-07-15 MED ORDER — HEPARIN SODIUM (PORCINE) 1000 UNIT/ML IJ SOLN
INTRAMUSCULAR | Status: AC
  Administered 2016-07-15: 1100 [IU] via INTRAVENOUS_CENTRAL
  Filled 2016-07-15: qty 6

## 2016-07-15 MED ORDER — ONDANSETRON HCL 4 MG/2ML IJ SOLN: 4.0000 mg | Freq: Four times a day (QID) | INTRAMUSCULAR | Status: DC | PRN

## 2016-07-15 MED ORDER — ALTEPLASE 2 MG IJ SOLR
2.0000 mg | Freq: Once | INTRAMUSCULAR | Status: DC | PRN
  Filled 2016-07-15: qty 2

## 2016-07-15 NOTE — ED Notes (Signed)
MD at bedside. 

## 2016-07-15 NOTE — ED Notes (Signed)
CRITICAL VALUE ALERT  Critical value received:  Potassium 6.7 and Troponin 0.04  Date of notification:  07/15/2016  Time of notification:  1724  Critical value read back:Yes.    Nurse who received alert:  Tarri Glennrystal Grey Rakestraw RN   MD notified (1st page):  Dr Clarene DukeMcManus  Time of first page:  1724  MD notified (2nd page):  Time of second page:  Responding MD:  Dr Clarene DukeMcManus  Time MD responded:  [phone]

## 2016-07-15 NOTE — H&P (Signed)
History and Physical    [name] WUJ:[id number] DOB: Apr 10, 1931 DOA: 07/15/2016  PCP: Fredirick Maudlin, MD   Patient coming from: Home   Chief Complaint: Dyspnea, swelling   HPI: [name] is a 82 y.o. female with medical history significant for dementia, CAD, chronic anemia and thrombocytopenia, and end-stage renal disease in the process of transitioning from HD to PD, now presenting to the emergency department with dyspnea and swelling. Patient is accompanied by her husband who assists with the history, which is also obtained from discussion with the ED personnel and review of the EMR. Patient had apparently been in her usual state of health and received HD on 07/09/2016 without incident. She then underwent PD training sessions with 4 hours of installation each day on 07/11/2016, 07/12/2016, and 07/13/2016. Since that time, she has developed worsening dyspnea and is now symptomatic at rest. Her husband is noted swelling in the extremities and periorbitally in the last couple days. There has been no recent fevers or chills and no complaints of chest pain, palpitations, or headaches.  ED Course: Upon arrival to the ED, patient is found to be afebrile, saturating adequately on room air, tachypneic in the 30s, and hypertensive to the 190/100 range. EKG demonstrates sinus rhythm with first degree AV nodal block and nonspecific intraventricular conduction delay. Chest x-ray is notable for patchy airspace disease bilaterally consistent with edema or multifocal pneumonia. Chemistry panel is notable for a potassium of 6.7, bicarbonate of 18, anion gap 22, BUN 137, and serum creatinine 10.76. CBC features a stable normocytic anemia with hemoglobin of 11.8 and a stable thrombocytopenia with platelet count 130,000. Lactic acid is reassuring at 1.1, troponin is mildly elevated to 0.04, and BNP is elevated to greater than 2000. Patient was treated with albuterol nebulizer and D50 with insulin in the  emergency department. Nephrology was consulted by the ED physician and has arrange for inpatient hemodialysis this evening. Patient will be admitted to the stepdown unit for ongoing evaluation and management of acute dyspnea suspected secondary to volume overload in the setting of insufficient dialysis.  Review of Systems:  All other systems reviewed and apart from HPI, are negative.  Past Medical History:  Diagnosis Date  . Anemia   . Arthritis   . Chronic systolic CHF (congestive heart failure) (HCC)    a. echo 06/14/14: EF 20-25%, diffuse HK, AK of mid-apicalinferolateral myocardium, DD, PASP 44 mm Hg; b. echo 11/2014 EF 30-35%, diastolic dysfunction, global HK, unable to exclude RWMA, mod MR; c. 01/2015 Echo: EF 45-50%, mild diff HK, gr1 DD, triv AI, mild MR, mildly dil LA, nl RV/PASP.  [name] Complication of anesthesia   . COPD (chronic obstructive pulmonary disease) (HCC)    a. on home O2  . Dementia   . ESRD (end stage renal disease) (HCC)    a. HD MWF; b. multiple fistula revisions now using permacath.  . History of blood transfusion    a. after fistula ruptured  . History of pneumonia 05/2013  . PONV (postoperative nausea and vomiting)   . Presumed CAD    a. presumed CAD - patient's son has declined cath/ischemic eval to date  . Secondary hyperparathyroidism (HCC)   . Shortness of breath dyspnea    doe    Past Surgical History:  Procedure Laterality Date  . AV FISTULA PLACEMENT Right 07/07/2013   Procedure: ARTERIOVENOUS (AV) FISTULA CREATION - RIGHT BRACHIAL CEPHALIC ;  Surgeon: Fransisco Hertz, MD;  Location: [facility] OR;  Service: Vascular;  Laterality: Right;  . CAPD INSERTION N/A 05/25/2016   Procedure: LAPAROSCOPIC INSERTION CONTINUOUS AMBULATORY PERITONEAL DIALYSIS  (CAPD) CATHETER;  Surgeon: Renford DillsGregory G Schnier, MD;  Location: [facility] ORS;  Service: Vascular;  Laterality: N/A;  . CHOLECYSTECTOMY  1992  . DIALYSIS FISTULA CREATION    . EXCHANGE OF A DIALYSIS CATHETER Left 12/17/2013    Procedure: EXCHANGE OF A DIALYSIS CATHETER;  Surgeon: Chuck Hinthristopher S Dickson, MD;  [facility];  Service: Vascular;  Laterality: Left;  . FISTULOGRAM Right 12/17/2013   Procedure: FISTULOGRAM- RIGHT ARM- POSSIBLE INTERVENTION;  Surgeon: Chuck Hinthristopher S Dickson, MD;  [facility];  Service: Vascular;  Laterality: Right;  . HEMATOMA EVACUATION Right 05/25/2016   Procedure: EVACUATION HEMATOMA ( DRAINAGE );  Surgeon: Renford DillsGregory G Schnier, MD;  Location: [facility] ORS;  Service: Vascular;  Laterality: Right;  . INSERTION OF DIALYSIS CATHETER Right 06/04/2013   Procedure: INSERTION OF DIALYSIS CATHETER;  Surgeon: Fransisco HertzBrian L Chen, MD;  [facility];  Service: Vascular;  Laterality: Right;  . INSERTION OF DIALYSIS CATHETER Left 11/09/2013   Procedure: INSERTION OF DIALYSIS CATHETER- Left TDC;  Surgeon: Fransisco HertzBrian L Chen, MD;  [facility];  Service: Vascular;  Laterality: Left;  . LIGATION OF ARTERIOVENOUS  FISTULA Left 06/04/2013   Procedure: LIGATION OF ARTERIOVENOUS  FISTULA;  Surgeon: Fransisco HertzBrian L Chen, MD;  [facility];  Service: Vascular;  Laterality: Left;  . LIGATION OF ARTERIOVENOUS  FISTULA Right 01/19/2014   Procedure: LIGATION OF ARTERIOVENOUS  FISTULA- RIGHT BRACHIOCEPHALIC FISTULA;  Surgeon: Fransisco HertzBrian L Chen, MD;  [facility];  Service: Vascular;  Laterality: Right;  . LIGATION OF COMPETING BRANCHES OF ARTERIOVENOUS FISTULA Right 12/17/2013   Procedure: LIGATION OF COMPETING BRANCHES OF ARTERIOVENOUS FISTULA;  Surgeon: Chuck Hinthristopher S Dickson, MD;  [facility];  Service: Vascular;  Laterality: Right;  . PERIPHERAL VASCULAR CATHETERIZATION N/A 06/28/2015   Procedure: Dialysis/Perma Catheter Insertion;  Surgeon: Renford DillsGregory G Schnier, MD;  Location: [facility] INVASIVE CV LAB;  Service: Cardiovascular;  Laterality: N/A;  . PERIPHERAL VASCULAR CATHETERIZATION N/A 09/28/2015   Procedure: Dialysis/Perma Catheter Insertion;  Surgeon: Renford DillsGregory G Schnier, MD;  Location: [facility] INVASIVE CV LAB;  Service: Cardiovascular;   Laterality: N/A;  . PERIPHERAL VASCULAR CATHETERIZATION N/A 01/03/2016   Procedure: Dialysis/Perma Catheter Insertion;  Surgeon: Renford DillsGregory G Schnier, MD;  Location: [facility] INVASIVE CV LAB;  Service: Cardiovascular;  Laterality: N/A;  . PERIPHERAL VASCULAR CATHETERIZATION N/A 01/26/2016   Procedure: Dialysis/Perma Catheter Removal;  Surgeon: Annice NeedyJason S Dew, MD;  Location: [facility] INVASIVE CV LAB;  Service: Cardiovascular;  Laterality: N/A;  . REMOVAL OF A DIALYSIS CATHETER Right 11/09/2013   Procedure: REMOVAL OF A DIALYSIS CATHETER- Right TDC ;  Surgeon: Fransisco HertzBrian L Chen, MD;  [facility];  Service: Vascular;  Laterality: Right;  . SHUNTOGRAM Right 09/10/2013   Procedure: Fistulogram;  Surgeon: Fransisco HertzBrian L Chen, MD;  [facility];  Service: Cardiovascular;  Laterality: Right;     reports that she has never smoked. She has never used smokeless tobacco. She reports that she does not drink alcohol or use drugs.  Allergies  Allergen Reactions  . Sulfa Antibiotics Nausea And Vomiting and Rash  . Sulfur Nausea And Vomiting and Rash  . Aspirin Nausea And Vomiting    Family History  Problem Relation Age of Onset  . Family history unknown: Yes     Prior to Admission medications   Medication Sig Start Date End Date Taking? Authorizing Provider  acetaminophen (TYLENOL) 325 MG tablet Take 650 mg by mouth  every 6 (six) hours as needed for mild pain.   Yes Historical Provider, MD  ALPRAZolam Prudy Feeler) 0.5 MG tablet Take 0.5 mg by mouth daily as needed for anxiety.  07/03/16  Yes Historical Provider, MD  Amino Acid Infusion (PROSOL) 20 % SOLN Inject 4 L into the vein 7 days. 06/24/15  Yes Historical Provider, MD  carvedilol (COREG) 3.125 MG tablet Take 3.125 mg by mouth 2 (two) times daily with a meal.  06/22/16  Yes Historical Provider, MD  clopidogrel (PLAVIX) 75 MG tablet Take 75 mg by mouth daily.  01/04/15  Yes Historical Provider, MD  diazepam (VALIUM) 5 MG tablet Take 5 mg by mouth daily as needed for  anxiety.  05/21/16  Yes Historical Provider, MD  donepezil (ARICEPT) 10 MG tablet Take 10 mg by mouth at bedtime.   Yes Historical Provider, MD  Memantine HCl ER (NAMENDA XR) 28 MG CP24 Take 28 mg by mouth daily.   Yes Historical Provider, MD  multivitamin (RENA-VIT) TABS tablet Take 1 tablet by mouth daily.   Yes Historical Provider, MD  sevelamer carbonate (RENVELA) 2.4 G PACK Take 2.4 g by mouth 3 (three) times daily with meals.   Yes Historical Provider, MD  torsemide (DEMADEX) 20 MG tablet Take 20 mg by mouth daily.   Yes Historical Provider, MD  HYDROcodone-acetaminophen (NORCO) 5-325 MG tablet Take 1 tablet by mouth every 6 (six) hours as needed for moderate pain or severe pain. Patient not taking: Reported on 07/15/2016 05/25/16   Renford Dills, MD    Physical Exam: Vitals:   07/15/16 2000 07/15/16 2015 07/15/16 2030 07/15/16 2045  BP: 114/72 128/73 121/73 112/75  Pulse: 84 84 85 83  Resp: (!) 23 20 (!) 23 19  Temp:      TempSrc:      SpO2:      Weight:      Height:          Constitutional: In respiratory distress with tachypnea and accessory muscle use  Eyes: PERTLA, lids and conjunctivae normal ENMT: Mucous membranes are moist. Posterior pharynx clear of any exudate or lesions.   Neck: normal, supple, no masses, no thyromegaly Respiratory: Crackles throughout, increased WOB, no pallor or cyanosis Cardiovascular: S1 & S2 heard, regular rate and rhythm No carotid bruits. Neck veins distended. Abdomen: No distension, no tenderness, no masses palpated. Bowel sounds normal.  Musculoskeletal: no clubbing / cyanosis. No joint deformity upper and lower extremities. Normal muscle tone.  Skin: no significant rashes, lesions, ulcers. Warm, dry, well-perfused. Neurologic: CN 2-12 grossly intact. Sensation intact, DTR normal. Strength 5/5 in all 4 limbs.  Psychiatric:  Alert and oriented to person and hospital only.     Labs on Admission: I have personally reviewed following labs  and imaging studies  CBC:  Recent Labs Lab 07/15/16 1624  WBC 7.3  NEUTROABS 5.8  HGB 11.8*  HCT 37.2  MCV 92.3  PLT 130*   Basic Metabolic Panel:  Recent Labs Lab 07/15/16 1624  NA 139  K 6.7*  CL 99*  CO2 18*  GLUCOSE 99  BUN 137*  CREATININE 10.76*  CALCIUM 9.0   GFR: Estimated Creatinine Clearance: 3.3 mL/min (by C-G formula based on SCr of 10.76 mg/dL). Liver Function Tests: No results for input(s): AST, ALT, ALKPHOS, BILITOT, PROT, ALBUMIN in the last 168 hours. No results for input(s): LIPASE, AMYLASE in the last 168 hours. No results for input(s): AMMONIA in the last 168 hours. Coagulation Profile: No results for input(s):  INR, PROTIME in the last 168 hours. Cardiac Enzymes:  Recent Labs Lab 07/15/16 1624  TROPONINI 0.04*   BNP (last 3 results) No results for input(s): PROBNP in the last 8760 hours. HbA1C: No results for input(s): HGBA1C in the last 72 hours. CBG: No results for input(s): GLUCAP in the last 168 hours. Lipid Profile: No results for input(s): CHOL, HDL, LDLCALC, TRIG, CHOLHDL, LDLDIRECT in the last 72 hours. Thyroid Function Tests: No results for input(s): TSH, T4TOTAL, FREET4, T3FREE, THYROIDAB in the last 72 hours. Anemia Panel: No results for input(s): VITAMINB12, FOLATE, FERRITIN, TIBC, IRON, RETICCTPCT in the last 72 hours. Urine analysis:    Component Value Date/Time   COLORURINE Yellow 12/13/2014 1410   COLORURINE YELLOW 08/09/2014 1043   APPEARANCEUR Turbid 12/13/2014 1410   LABSPEC 1.006 12/13/2014 1410   PHURINE 7.0 12/13/2014 1410   PHURINE 7.0 08/09/2014 1043   GLUCOSEU 50 mg/dL 16/08/9603 5409   HGBUR 1+ 12/13/2014 1410   HGBUR LARGE (A) 08/09/2014 1043   BILIRUBINUR Negative 12/13/2014 1410   KETONESUR Negative 12/13/2014 1410   KETONESUR NEGATIVE 08/09/2014 1043   PROTEINUR 100 mg/dL 81/19/1478 2956   PROTEINUR 100 (A) 08/09/2014 1043   UROBILINOGEN 0.2 08/09/2014 1043   NITRITE Negative 12/13/2014 1410    NITRITE NEGATIVE 08/09/2014 1043   LEUKOCYTESUR 3+ 12/13/2014 1410   Sepsis Labs: @LABRCNTIP (procalcitonin:4,lacticidven:4) )No results found for this or any previous visit (from the past 240 hour(s)).   Radiological Exams on Admission: Dg Chest Portable 1 View  Result Date: 07/15/2016 CLINICAL DATA:  Short breath EXAM: PORTABLE CHEST 1 VIEW COMPARISON:  Radiograph 02/01/2015 FINDINGS: Large or RIGHT central venous line tip in the RIGHT atrium. Venous line from the inferior approach with tip in the RIGHT atrium also. Vascular stent in LEFT subclavian vein or artery. There is dense LEFT basilar atelectasis. There is patchy airspace disease in the RIGHT upper lobe and RIGHT lower lobe. IMPRESSION: 1. Patchy airspace disease the RIGHT upper lobe RIGHT lower lobe representing edema or multifocal pneumonia. 2. Dense LEFT basilar atelectasis representing pneumonia, edema or atelectasis. Electronically Signed   By: Genevive Bi M.D.   On: 07/15/2016 15:53    EKG: Independently reviewed. Sinus rhythm, 1st deg AV block, non-specific IVCD   Assessment/Plan  1. Respiratory distress, fluid overload  - Pt presents with respiratory distress, edema on CXR, and peripheral swelling - No productive cough, leukocytosis, or lactic acid elevation to suggest infectious etiology - Nephrology has been consulted and is much appreciated; HD is underway  - Suspect this is secondary to insufficient dialysis with training PD last week and anticipate improvement with HD/fluid removal   - Monitor in stepdown with continuous pulse oximetry   2. Hyperkalemia  - Serum potassium 6.7 on admission in setting of ESRD  - Suspected secondary to insufficient dialysis with training PD last week  - Temporizing measures given in ED  - Nephrology has been consulted and is much appreciated; HD is underway  - Check a potassium level after HD, monitor on telemetry    3. ESRD - Had been undergoing training PD last week; last HD  was on 07/09/16 - Currently in vol o/l with hyperkalemia being managed with inpatient HD as above    4. Normocytic anemia, thrombocytopenia  - Hgb 11.8 on admission and platelets 130,000  - There is no s/s of active blood loss and both indices appear to be stable relative to prior CBC's    5. Elevated troponin  - Troponin mildly elevated  to 0.04 in ED in setting of ESRD with vol o/l  - No acute ischemic features on EKG  - Likely secondary to demand from vol o/l with ESRD  - Monitor on telemetry for ischemic changes and trend troponin    6. Dementia  - Stable, continue current therapies with Namenda and Aricept - Monitor for sundowning; frequent reorientation, minimize restraints    7. CAD  - Currently under evaluation for mild elevation in troponin as above  - Presumed to have CAD per notes, though her husband has declined offer for cath in past  - Continue telemetry monitoring, trend troponin, continue Coreg, Plavix    DVT prophylaxis: sq heparin Code Status: Full  Family Communication: Husband updated at bedside Disposition Plan: Admit to stepdown Consults called: Nephrology Admission status: Inpatient    Briscoe Deutscher, MD [facility] Pager [phone]  If 7PM-7AM, please contact night-coverage [email or web address] Password [email or web address]  07/15/2016, 9:00 PM

## 2016-07-15 NOTE — Consult Note (Signed)
Reason for Consult: Hyperkalemia, fluid load and end-stage renal disease Referring Physician: [facility]  [name] is an 82 y.o. female.  HPI: She is a patient has history of hypertension, severe dementia, end-stage renal disease on maintenance hemodialysis presently was brought by her family is because of increase fascial puffiness and occupational difficulty breathing. Patient is newly started on PD training. Patient was evaluated in dialysis unit on Friday and training was done but since she was stable no hemodialysis was done. Presently she was brought by her son because of the concern that she may be fluid overloaded. When she was evaluated she was found to have hyperkalemia and also some sign of fluid off and see consult is called. Because of dementia patient denies any problems.  Past Medical History:  Diagnosis Date  . Anemia   . Arthritis   . Chronic systolic CHF (congestive heart failure) ([place])    a. echo 06/14/14: EF 20-25%, diffuse HK, AK of mid-apicalinferolateral myocardium, DD, PASP 44 mm Hg; b. echo 11/2014 EF 16-10%, diastolic dysfunction, global HK, unable to exclude RWMA, mod MR; c. 01/2015 Echo: EF 45-50%, mild diff HK, gr1 DD, triv AI, mild MR, mildly dil LA, nl RV/PASP.  [name] Complication of anesthesia   . COPD (chronic obstructive pulmonary disease) ([place])    a. on home O2  . Dementia   . ESRD (end stage renal disease) ([place])    a. HD MWF; b. multiple fistula revisions now using permacath.  . History of blood transfusion    a. after fistula ruptured  . History of pneumonia 05/2013  . PONV (postoperative nausea and vomiting)   . Presumed CAD    a. presumed CAD - patient's son has declined cath/ischemic eval to date  . Secondary hyperparathyroidism ([place])   . Shortness of breath dyspnea    doe    Past Surgical History:  Procedure Laterality Date  . AV FISTULA PLACEMENT Right 07/07/2013   Procedure: ARTERIOVENOUS (AV) FISTULA CREATION - RIGHT BRACHIAL  CEPHALIC ;  Surgeon: Conrad Piedmont, MD;  Location: [place];  Service: Vascular;  Laterality: Right;  . CAPD INSERTION N/A 05/25/2016   Procedure: LAPAROSCOPIC INSERTION CONTINUOUS AMBULATORY PERITONEAL DIALYSIS  (CAPD) CATHETER;  Surgeon: Katha Cabal, MD;  Location: [facility];  Service: Vascular;  Laterality: N/A;  . CHOLECYSTECTOMY  1992  . DIALYSIS FISTULA CREATION    . EXCHANGE OF A DIALYSIS CATHETER Left 12/17/2013   Procedure: EXCHANGE OF A DIALYSIS CATHETER;  Surgeon: Angelia Mould, MD;  Location: [place];  Service: Vascular;  Laterality: Left;  . FISTULOGRAM Right 12/17/2013   Procedure: FISTULOGRAM- RIGHT ARM- POSSIBLE INTERVENTION;  Surgeon: Angelia Mould, MD;  Location: [place];  Service: Vascular;  Laterality: Right;  . HEMATOMA EVACUATION Right 05/25/2016   Procedure: EVACUATION HEMATOMA ( DRAINAGE );  Surgeon: Katha Cabal, MD;  Location: [facility];  Service: Vascular;  Laterality: Right;  . INSERTION OF DIALYSIS CATHETER Right 06/04/2013   Procedure: INSERTION OF DIALYSIS CATHETER;  Surgeon: Conrad Crestview, MD;  Location: [place];  Service: Vascular;  Laterality: Right;  . INSERTION OF DIALYSIS CATHETER Left 11/09/2013   Procedure: INSERTION OF DIALYSIS CATHETER- Left TDC;  Surgeon: Conrad Potter, MD;  Location: [place];  Service: Vascular;  Laterality: Left;  . LIGATION OF ARTERIOVENOUS  FISTULA Left 06/04/2013   Procedure: LIGATION OF ARTERIOVENOUS  FISTULA;  Surgeon: Conrad Ingram, MD;  Location: [place];  Service: Vascular;  Laterality: Left;  .  LIGATION OF ARTERIOVENOUS  FISTULA Right 01/19/2014   Procedure: LIGATION OF ARTERIOVENOUS  FISTULA- RIGHT BRACHIOCEPHALIC FISTULA;  Surgeon: Conrad Pinellas, MD;  Location: [place];  Service: Vascular;  Laterality: Right;  . LIGATION OF COMPETING BRANCHES OF ARTERIOVENOUS FISTULA Right 12/17/2013   Procedure: LIGATION OF COMPETING BRANCHES OF ARTERIOVENOUS FISTULA;  Surgeon: Angelia Mould, MD;  Location: [place];  Service: Vascular;   Laterality: Right;  . PERIPHERAL VASCULAR CATHETERIZATION N/A 06/28/2015   Procedure: Dialysis/Perma Catheter Insertion;  Surgeon: Katha Cabal, MD;  Location: [place] [facility];  Service: Cardiovascular;  Laterality: N/A;  . PERIPHERAL VASCULAR CATHETERIZATION N/A 09/28/2015   Procedure: Dialysis/Perma Catheter Insertion;  Surgeon: Katha Cabal, MD;  Location: [place] [facility];  Service: Cardiovascular;  Laterality: N/A;  . PERIPHERAL VASCULAR CATHETERIZATION N/A 01/03/2016   Procedure: Dialysis/Perma Catheter Insertion;  Surgeon: Katha Cabal, MD;  Location: [place] [facility];  Service: Cardiovascular;  Laterality: N/A;  . PERIPHERAL VASCULAR CATHETERIZATION N/A 01/26/2016   Procedure: Dialysis/Perma Catheter Removal;  Surgeon: Algernon Huxley, MD;  Location: [place] [facility];  Service: Cardiovascular;  Laterality: N/A;  . REMOVAL OF A DIALYSIS CATHETER Right 11/09/2013   Procedure: REMOVAL OF A DIALYSIS CATHETER- Right TDC ;  Surgeon: Conrad Lamoni, MD;  Location: [place];  Service: Vascular;  Laterality: Right;  . SHUNTOGRAM Right 09/10/2013   Procedure: Fistulogram;  Surgeon: Conrad Heidelberg, MD;  Location: [facility];  Service: Cardiovascular;  Laterality: Right;    Family History  Problem Relation Age of Onset  . Family history unknown: Yes    Social History:  reports that she has never smoked. She has never used smokeless tobacco. She reports that she does not drink alcohol or use drugs.  Allergies:  Allergies  Allergen Reactions  . Sulfa Antibiotics Nausea And Vomiting and Rash  . Sulfur Nausea And Vomiting and Rash  . Aspirin Nausea And Vomiting    Medications: I have reviewed the patient's current medications.  Results for orders placed or performed during the hospital encounter of 07/15/16 (from the past 48 hour(s))  Basic metabolic panel     Status: Abnormal   Collection Time: 07/15/16  4:24 PM  Result Value Ref Range   Sodium 139 135 - 145 mmol/L    Potassium 6.7 (HH) 3.5 - 5.1 mmol/L    Comment: CRITICAL RESULT CALLED TO, READ BACK BY AND VERIFIED WITH:  BLACKBURN,C @ 1723 ON 07/15/16 BY JUW    Chloride 99 (L) 101 - 111 mmol/L   CO2 18 (L) 22 - 32 mmol/L   Glucose, Bld 99 65 - 99 mg/dL   BUN 137 (H) 6 - 20 mg/dL    Comment: RESULTS CONFIRMED BY MANUAL DILUTION   Creatinine, Ser 10.76 (H) 0.44 - 1.00 mg/dL   Calcium 9.0 8.9 - 10.3 mg/dL   GFR calc non Af Amer 3 (L) >60 mL/min   GFR calc Af Amer 3 (L) >60 mL/min    Comment: (NOTE) The eGFR has been calculated using the CKD EPI equation. This calculation has not been validated in all clinical situations. eGFR's persistently <60 mL/min signify possible Chronic Kidney Disease.    Anion gap 22 (H) 5 - 15  Troponin I     Status: Abnormal   Collection Time: 07/15/16  4:24 PM  Result Value Ref Range   Troponin I 0.04 (HH) <0.03 ng/mL    Comment: CRITICAL RESULT CALLED TO, READ BACK BY AND VERIFIED WITH:  BLACKBURN,C @ 1723 ON 07/15/16 BY JUW   Lactic acid, plasma     Status: None   Collection Time: 07/15/16  4:24 PM  Result Value Ref Range   Lactic Acid, Venous 1.1 0.5 - 1.9 mmol/L  CBC with Differential     Status: Abnormal   Collection Time: 07/15/16  4:24 PM  Result Value Ref Range   WBC 7.3 4.0 - 10.5 K/uL   RBC 4.03 3.87 - 5.11 MIL/uL   Hemoglobin 11.8 (L) 12.0 - 15.0 g/dL   HCT 37.2 36.0 - 46.0 %   MCV 92.3 78.0 - 100.0 fL   MCH 29.3 26.0 - 34.0 pg   MCHC 31.7 30.0 - 36.0 g/dL   RDW 15.6 (H) 11.5 - 15.5 %   Platelets 130 (L) 150 - 400 K/uL   Neutrophils Relative % 79 %   Neutro Abs 5.8 1.7 - 7.7 K/uL   Lymphocytes Relative 11 %   Lymphs Abs 0.8 0.7 - 4.0 K/uL   Monocytes Relative 7 %   Monocytes Absolute 0.5 0.1 - 1.0 K/uL   Eosinophils Relative 3 %   Eosinophils Absolute 0.2 0.0 - 0.7 K/uL   Basophils Relative 0 %   Basophils Absolute 0.0 0.0 - 0.1 K/uL  Brain natriuretic peptide     Status: Abnormal   Collection Time: 07/15/16  4:24 PM  Result Value Ref Range    B Natriuretic Peptide 2,033.0 (H) 0.0 - 100.0 pg/mL    Dg Chest Portable 1 View  Result Date: 07/15/2016 CLINICAL DATA:  Short breath EXAM: PORTABLE CHEST 1 VIEW COMPARISON:  Radiograph 02/01/2015 FINDINGS: Large or RIGHT central venous line tip in the RIGHT atrium. Venous line from the inferior approach with tip in the RIGHT atrium also. Vascular stent in LEFT subclavian vein or artery. There is dense LEFT basilar atelectasis. There is patchy airspace disease in the RIGHT upper lobe and RIGHT lower lobe. IMPRESSION: 1. Patchy airspace disease the RIGHT upper lobe RIGHT lower lobe representing edema or multifocal pneumonia. 2. Dense LEFT basilar atelectasis representing pneumonia, edema or atelectasis. Electronically Signed   By: Suzy Bouchard M.D.   On: 07/15/2016 15:53    Review of Systems  Unable to perform ROS: Dementia  Constitutional: Negative for chills and malaise/fatigue.  Respiratory: Negative for shortness of breath.   Gastrointestinal: Negative for nausea and vomiting.  All other systems reviewed and are negative.  Blood pressure 177/90, pulse 77, temperature 98.2 F (36.8 C), temperature source Oral, resp. rate 20, height _0  (1.6 m), weight 55 kg (121 lb 4.1 oz), SpO2 99 %. Physical Exam  Constitutional: No distress.  HENT:  Puffiness of her face  Eyes: No scleral icterus.  Neck: JVD present.  Cardiovascular: Normal rate and regular rhythm.   Respiratory: No respiratory distress. She has wheezes.  GI: She exhibits no distension. There is no tenderness.  Musculoskeletal: She exhibits edema.  Neurological: She is alert.    Assessment/Plan: Problem #1 hyperkalemia: Possibly from lack of dialysis. Her PD training exchange doesn't seem to be enough to control her potassium. At this moment high potassium intake also need to be considered. Problem #2 CHF Problem #3 end-stage renal disease: Her BUN is high but presently there is no history of nausea or vomiting. Her  appetite seems to be okay. Problem #4 hypertension: Her blood pressure is somewhat high Problem #5 dementia: Severe Problem #6 anemia: Her hemoglobin is within our target goal Problem #7 metabolic bone disease: Her calcium in the range.  Plan: 1] We'll make arrangements for patient to be dialysis for 3-1/2 hours today 2] we'll remove 3 L if her blood pressure tolerates 3] we'll check her basic metabolic panel if that is stable possibly patient could be discharged and followed as an outpatient.  Magdiel Bartles S 07/15/2016, 6:19 PM

## 2016-07-15 NOTE — ED Provider Notes (Signed)
[facility]-EMER[facility]ENCY DEPT Provider Note   CSN: [id number] Arrival date & time: 07/15/16  1517     History   Chief Complaint Chief Complaint  Patient presents with  . Shortness of Breath    HPI [name] is a 82 y.o. female.  The history is provided by a relative and the patient. The history is limited by the condition of the patient (Hx dementia).  Shortness of Breath   Pt was seen at 1545.  Per pt's family: Pt with gradual onset and worsening of increasing "fluid retention" for the past 4 days. Pt had her last HD treatment 6 days ago, and has been receiving PD Wed-Fri of this past week. Pt's son states "they thought she could wait until Monday to get PD again" but pt's son states pt "has been getting more swollen" and "saying she doesn't feel well." No reported fevers, no CP, no abd pian, no N/V/D. Pt has significant hx of dementia.   Past Medical History:  Diagnosis Date  . Anemia   . Arthritis   . Chronic systolic CHF (congestive heart failure) (HCC)    a. echo 06/14/14: EF 20-25%, diffuse HK, AK of mid-apicalinferolateral myocardium, DD, PASP 44 mm Hg; b. echo 11/2014 EF 30-35%, diastolic dysfunction, global HK, unable to exclude RWMA, mod MR; c. 01/2015 Echo: EF 45-50%, mild diff HK, gr1 DD, triv AI, mild MR, mildly dil LA, nl RV/PASP.  [name] Complication of anesthesia   . COPD (chronic obstructive pulmonary disease) (HCC)    a. on home O2  . Dementia   . ESRD (end stage renal disease) (HCC)    a. HD MWF; b. multiple fistula revisions now using permacath.  . History of blood transfusion    a. after fistula ruptured  . History of pneumonia 05/2013  . PONV (postoperative nausea and vomiting)   . Presumed CAD    a. presumed CAD - patient's son has declined cath/ischemic eval to date  . Secondary hyperparathyroidism (HCC)   . Shortness of breath dyspnea    doe    Patient Active Problem List   Diagnosis Date Noted  . Complication of dialysis access insertion (HCC) 09/28/2015  .  Hematoma 06/29/2015  . CAD (coronary artery disease) 01/07/2015  . Infection of AV graft for dialysis (HCC) 01/07/2015  . COPD (chronic obstructive pulmonary disease) (HCC)   . Chronic systolic CHF (congestive heart failure) (HCC)   . Urinary tract infection 06/08/2014  . Respiratory failure (HCC) 02/05/2014  . Dementia 02/05/2014  . Toxic metabolic encephalopathy 12/23/2013  . Anemia of chronic disease 12/23/2013  . UTI (lower urinary tract infection) 12/20/2013  . Swelling of limb 11/06/2013  . Other complications due to renal dialysis device, implant, and graft 11/06/2013  . Removal of staples 06/19/2013  . End stage renal disease (HCC) 06/19/2013  . Pseudoaneurysm of arteriovenous dialysis fistula (HCC) 06/19/2013  . ESRD (end stage renal disease) on dialysis (HCC) 06/09/2013  . HCAP (healthcare-associated pneumonia) 06/09/2013  . Thrombocytopenia, unspecified (HCC) 06/04/2013  . Acute blood loss anemia 06/02/2013  . Clostridium difficile colitis 04/30/2013  . Acute respiratory failure (HCC) 04/29/2013  . Pleural effusion 04/28/2013  . Volume overload 04/28/2013  . CKD (chronic kidney disease) stage V requiring chronic dialysis (HCC) 04/25/2013  . Aspiration pneumonia (HCC) 04/25/2013  . Senile dementia 04/25/2013  . CAP (community acquired pneumonia) 04/25/2013    Past Surgical History:  Procedure Laterality Date  . AV FISTULA PLACEMENT Right 07/07/2013   Procedure: ARTERIOVENOUS (AV) FISTULA CREATION -  RIGHT BRACHIAL CEPHALIC ;  Surgeon: Fransisco HertzBrian L Chen, MD;  [facility];  Service: Vascular;  Laterality: Right;  . CAPD INSERTION N/A 05/25/2016   Procedure: LAPAROSCOPIC INSERTION CONTINUOUS AMBULATORY PERITONEAL DIALYSIS  (CAPD) CATHETER;  Surgeon: Renford DillsGregory G Schnier, MD;  Location: [facility] ORS;  Service: Vascular;  Laterality: N/A;  . CHOLECYSTECTOMY  1992  . DIALYSIS FISTULA CREATION    . EXCHANGE OF A DIALYSIS CATHETER Left 12/17/2013   Procedure: EXCHANGE OF A DIALYSIS  CATHETER;  Surgeon: Chuck Hinthristopher S Dickson, MD;  [facility];  Service: Vascular;  Laterality: Left;  . FISTULOGRAM Right 12/17/2013   Procedure: FISTULOGRAM- RIGHT ARM- POSSIBLE INTERVENTION;  Surgeon: Chuck Hinthristopher S Dickson, MD;  [facility];  Service: Vascular;  Laterality: Right;  . HEMATOMA EVACUATION Right 05/25/2016   Procedure: EVACUATION HEMATOMA ( DRAINAGE );  Surgeon: Renford DillsGregory G Schnier, MD;  Location: [facility] ORS;  Service: Vascular;  Laterality: Right;  . INSERTION OF DIALYSIS CATHETER Right 06/04/2013   Procedure: INSERTION OF DIALYSIS CATHETER;  Surgeon: Fransisco HertzBrian L Chen, MD;  [facility];  Service: Vascular;  Laterality: Right;  . INSERTION OF DIALYSIS CATHETER Left 11/09/2013   Procedure: INSERTION OF DIALYSIS CATHETER- Left TDC;  Surgeon: Fransisco HertzBrian L Chen, MD;  [facility];  Service: Vascular;  Laterality: Left;  . LIGATION OF ARTERIOVENOUS  FISTULA Left 06/04/2013   Procedure: LIGATION OF ARTERIOVENOUS  FISTULA;  Surgeon: Fransisco HertzBrian L Chen, MD;  [facility];  Service: Vascular;  Laterality: Left;  . LIGATION OF ARTERIOVENOUS  FISTULA Right 01/19/2014   Procedure: LIGATION OF ARTERIOVENOUS  FISTULA- RIGHT BRACHIOCEPHALIC FISTULA;  Surgeon: Fransisco HertzBrian L Chen, MD;  [facility];  Service: Vascular;  Laterality: Right;  . LIGATION OF COMPETING BRANCHES OF ARTERIOVENOUS FISTULA Right 12/17/2013   Procedure: LIGATION OF COMPETING BRANCHES OF ARTERIOVENOUS FISTULA;  Surgeon: Chuck Hinthristopher S Dickson, MD;  [facility];  Service: Vascular;  Laterality: Right;  . PERIPHERAL VASCULAR CATHETERIZATION N/A 06/28/2015   Procedure: Dialysis/Perma Catheter Insertion;  Surgeon: Renford DillsGregory G Schnier, MD;  Location: [facility] INVASIVE CV LAB;  Service: Cardiovascular;  Laterality: N/A;  . PERIPHERAL VASCULAR CATHETERIZATION N/A 09/28/2015   Procedure: Dialysis/Perma Catheter Insertion;  Surgeon: Renford DillsGregory G Schnier, MD;  Location: [facility] INVASIVE CV LAB;  Service: Cardiovascular;  Laterality: N/A;  . PERIPHERAL VASCULAR  CATHETERIZATION N/A 01/03/2016   Procedure: Dialysis/Perma Catheter Insertion;  Surgeon: Renford DillsGregory G Schnier, MD;  Location: [facility] INVASIVE CV LAB;  Service: Cardiovascular;  Laterality: N/A;  . PERIPHERAL VASCULAR CATHETERIZATION N/A 01/26/2016   Procedure: Dialysis/Perma Catheter Removal;  Surgeon: Annice NeedyJason S Dew, MD;  Location: [facility] INVASIVE CV LAB;  Service: Cardiovascular;  Laterality: N/A;  . REMOVAL OF A DIALYSIS CATHETER Right 11/09/2013   Procedure: REMOVAL OF A DIALYSIS CATHETER- Right TDC ;  Surgeon: Fransisco HertzBrian L Chen, MD;  [facility];  Service: Vascular;  Laterality: Right;  . SHUNTOGRAM Right 09/10/2013   Procedure: Fistulogram;  Surgeon: Fransisco HertzBrian L Chen, MD;  [facility];  Service: Cardiovascular;  Laterality: Right;    OB History    No data available       Home Medications    Prior to Admission medications   Medication Sig Start Date End Date Taking? Authorizing Provider  acetaminophen (TYLENOL) 325 MG tablet Take 650 mg by mouth every 6 (six) hours as needed for mild pain.   Yes Historical Provider, MD  ALPRAZolam Prudy Feeler(XANAX) 0.5 MG tablet Take 0.5 mg by mouth daily as needed for anxiety.  07/03/16  Yes Historical  Provider, MD  Amino Acid Infusion (PROSOL) 20 % SOLN Inject 4 L into the vein 7 days. 06/24/15  Yes Historical Provider, MD  carvedilol (COREG) 3.125 MG tablet Take 3.125 mg by mouth 2 (two) times daily with a meal.  06/22/16  Yes Historical Provider, MD  clopidogrel (PLAVIX) 75 MG tablet Take 75 mg by mouth daily.  01/04/15  Yes Historical Provider, MD  diazepam (VALIUM) 5 MG tablet Take 5 mg by mouth daily as needed for anxiety.  05/21/16  Yes Historical Provider, MD  donepezil (ARICEPT) 10 MG tablet Take 10 mg by mouth at bedtime.   Yes Historical Provider, MD  Memantine HCl ER (NAMENDA XR) 28 MG CP24 Take 28 mg by mouth daily.   Yes Historical Provider, MD  multivitamin (RENA-VIT) TABS tablet Take 1 tablet by mouth daily.   Yes Historical Provider, MD  sevelamer carbonate  (RENVELA) 2.4 G PACK Take 2.4 g by mouth 3 (three) times daily with meals.   Yes Historical Provider, MD  torsemide (DEMADEX) 20 MG tablet Take 20 mg by mouth daily.   Yes Historical Provider, MD  HYDROcodone-acetaminophen (NORCO) 5-325 MG tablet Take 1 tablet by mouth every 6 (six) hours as needed for moderate pain or severe pain. Patient not taking: Reported on 07/15/2016 05/25/16   Renford Dills, MD    Family History Family History  Problem Relation Age of Onset  . Family history unknown: Yes    Social History Social History  Substance Use Topics  . Smoking status: Never Smoker  . Smokeless tobacco: Never Used  . Alcohol use No     Allergies   Sulfa antibiotics; Sulfur; and Aspirin   Review of Systems Review of Systems  Unable to perform ROS: Dementia  Respiratory: Positive for shortness of breath.      Physical Exam Updated Vital Signs BP 186/95   Pulse 76   Temp 97.6 F (36.4 C) (Oral)   Ht 5\' 3"  (1.6 m)   Wt 121 lb 4.1 oz (55 kg)   SpO2 98%   BMI 21.48 kg/m   Physical Exam 1550: Physical examination:  Nursing notes reviewed; Vital signs and O2 SAT reviewed;  Constitutional: Well developed, Well nourished, Well hydrated, In no acute distress; Head:  Normocephalic, atraumatic. +mild periorbital edema.; Eyes: EOMI, PERRL, No scleral icterus; ENMT: Mouth and pharynx normal, Mucous membranes moist; Neck: Supple, Full range of motion, No lymphadenopathy; Cardiovascular: Regular rate and rhythm, No gallop; Respiratory: Breath sounds clear & equal bilaterally, No wheezes.  Speaking full sentences with ease, Normal respiratory effort/excursion; Chest: Nontender, Movement normal; Abdomen: Soft, Nontender, Nondistended, Normal bowel sounds; Genitourinary: No CVA tenderness; Extremities: Pulses normal, No tenderness, +1 pedal edema bilat. No calf edema or asymmetry. +HD port left groin. +AV fistula RUE.; Neuro: Awake, alert, confused per hx dementia. No facial droop. Speech  clear. Moves all extremities on stretcher spontaneously and to command.; Skin: Color normal, Warm, Dry.   ED Treatments / Results  Labs (all labs ordered are listed, but only abnormal results are displayed)   EKG   EKG Interpretation  Date/Time:  Sunday July 15 2016 16:24:23 EDT Ventricular Rate:  73 PR Interval:    QRS Duration: 115 QT Interval:  439 QTC Calculation: 484 R Axis:   -161 Text Interpretation:  Sinus rhythm Prolonged PR interval Nonspecific intraventricular conduction delay Low voltage, precordial leads Lateral infarct, age indeterminate Nonspecific ST and T wave abnormality When compared with ECG of 08/09/2014 No significant change was found Reconfirmed by  [facility]  MD, Nicholos Johns [phone]) on 07/15/2016 6:15:11 PM         Radiology   Procedures Procedures (including critical care time)  Medications Ordered in ED Medications - No data to display   Initial Impression / Assessment and Plan / ED Course  I have reviewed the triage vital signs and the nursing notes.  Pertinent labs & imaging results that were available during my care of the patient were reviewed by me and considered in my medical decision making (see chart for details).  MDM Reviewed: previous chart, nursing note and vitals Reviewed previous: labs and ECG Interpretation: labs, ECG and x-ray Total time providing critical care: 30-74 minutes. This excludes time spent performing separately reportable procedures and services. Consults: admitting MD ([facility] MD)   CRITICAL CARE Performed by: Laray Anger Total critical care time: 35 minutes Critical care time was exclusive of separately billable procedures and treating other patients. Critical care was necessary to treat or prevent imminent or life-threatening deterioration. Critical care was time spent personally by me on the following activities: development of treatment plan with patient and/or surrogate as well as nursing, discussions  with consultants, evaluation of patient's response to treatment, examination of patient, obtaining history from patient or surrogate, ordering and performing treatments and interventions, ordering and review of laboratory studies, ordering and review of radiographic studies, pulse oximetry and re-evaluation of patient's condition.  ED ECG REPORT   Date: 07/15/2016  Rate: 73  Rhythm: normal sinus rhythm  QRS Axis: normal  Intervals: PR prolonged  ST/T Wave abnormalities: nonspecific ST/T changes  Conduction Disutrbances:first-degree A-V block   Narrative Interpretation:   Old EKG Reviewed: unchanged; no significant changes from previous EKG dated 12/27/2015.    Results for orders placed or performed during the hospital encounter of 07/15/16  Basic metabolic panel  Result Value Ref Range   Sodium 139 135 - 145 mmol/L   Potassium 6.7 (HH) 3.5 - 5.1 mmol/L   Chloride 99 (L) 101 - 111 mmol/L   CO2 18 (L) 22 - 32 mmol/L   Glucose, Bld 99 65 - 99 mg/dL   BUN 027 (H) 6 - 20 mg/dL   Creatinine, Ser 25.36 (H) 0.44 - 1.00 mg/dL   Calcium 9.0 8.9 - 64.4 mg/dL   GFR calc non Af Amer 3 (L) >60 mL/min   GFR calc Af Amer 3 (L) >60 mL/min   Anion gap 22 (H) 5 - 15  Troponin I  Result Value Ref Range   Troponin I 0.04 (HH) <0.03 ng/mL  Lactic acid, plasma  Result Value Ref Range   Lactic Acid, Venous 1.1 0.5 - 1.9 mmol/L  CBC with Differential  Result Value Ref Range   WBC 7.3 4.0 - 10.5 K/uL   RBC 4.03 3.87 - 5.11 MIL/uL   Hemoglobin 11.8 (L) 12.0 - 15.0 g/dL   HCT 03.4 74.2 - 59.5 %   MCV 92.3 78.0 - 100.0 fL   MCH 29.3 26.0 - 34.0 pg   MCHC 31.7 30.0 - 36.0 g/dL   RDW 63.8 (H) 75.6 - 43.3 %   Platelets 130 (L) 150 - 400 K/uL   Neutrophils Relative % 79 %   Neutro Abs 5.8 1.7 - 7.7 K/uL   Lymphocytes Relative 11 %   Lymphs Abs 0.8 0.7 - 4.0 K/uL   Monocytes Relative 7 %   Monocytes Absolute 0.5 0.1 - 1.0 K/uL   Eosinophils Relative 3 %   Eosinophils Absolute 0.2 0.0 - 0.7 K/uL  Basophils Relative 0 %   Basophils Absolute 0.0 0.0 - 0.1 K/uL  Brain natriuretic peptide  Result Value Ref Range   B Natriuretic Peptide 2,033.0 (H) 0.0 - 100.0 pg/mL    Dg Chest Portable 1 View Result Date: 07/15/2016 CLINICAL DATA:  Short breath EXAM: PORTABLE CHEST 1 VIEW COMPARISON:  Radiograph 02/01/2015 FINDINGS: Large or RIGHT central venous line tip in the RIGHT atrium. Venous line from the inferior approach with tip in the RIGHT atrium also. Vascular stent in LEFT subclavian vein or artery. There is dense LEFT basilar atelectasis. There is patchy airspace disease in the RIGHT upper lobe and RIGHT lower lobe. IMPRESSION: 1. Patchy airspace disease the RIGHT upper lobe RIGHT lower lobe representing edema or multifocal pneumonia. 2. Dense LEFT basilar atelectasis representing pneumonia, edema or atelectasis. Electronically Signed   By: Genevive Bi M.D.   On: 07/15/2016 15:53    Results for LING, FLESCH (MRN [id number]) as of 07/15/2016 17:51  Ref. Range 09/30/2015 09:40 05/17/2016 14:28 07/15/2016 16:24  BUN Latest Ref Range: 6 - 20 mg/dL 64 (H) 52 (H) 409 (H)  Creatinine Latest Ref Range: 0.44 - 1.00 mg/dL 8.11 (H) 9.14 (H) 78.29 (H)     1750:  Tx for hyperkalemia with IV D50, IV insulin and albuterol neb. CXR with bilat airspace disease; given normal WBC count and elevated BNP, likely fluid overload. Pt will need HD.  T/C to Renal Dr. Kristian Covey, case discussed, including:  HPI, pertinent PM/SHx, VS/PE, dx testing, ED course and treatment:  Agrees pt needs HD, admit to [facility] service.   1810:  Renal Dr. Kristian Covey at bedside.  T/C to [facility] Dr. Antionette Char, case discussed, including:  HPI, pertinent PM/SHx, VS/PE, dx testing, ED course and treatment:  Agreeable to admit, requests to write temporary orders, obtain stepdown bed to Dr. Juanetta Gosling' service.   Final Clinical Impressions(s) / ED Diagnoses   Final diagnoses:  None    New Prescriptions New Prescriptions   No medications on file      Samuel Jester, DO 07/19/16 2221

## 2016-07-15 NOTE — Procedures (Signed)
   EMERGENT HEMODIALYSIS TREATMENT NOTE:  3.5 hour low-heparin dialysis completed via left femoral tunneled catheter.  Exit site unremarkable. Goal met: 3 liters removed without interruption in ultrafiltration. All blood was returned.  Serum K lab draw was rescheduled for midnight (one hour post-dialysis).  Order noted for renal function panel at 0500 8/28.  Report given to Terence Lux, RN.  Rockwell Alexandria, RN, CDN

## 2016-07-15 NOTE — ED Triage Notes (Signed)
Pt from home, [facility] states with SOB and rales noted.  Pt denies pain

## 2016-07-15 NOTE — ED Notes (Signed)
Last HD was Monday per son, started PD for 4 hrs a time M-W-F

## 2016-07-16 LAB — RENAL FUNCTION PANEL
ANION GAP: 14 (ref 5–15)
Albumin: 3.4 g/dL — ABNORMAL LOW (ref 3.5–5.0)
BUN: 70 mg/dL — ABNORMAL HIGH (ref 6–20)
CALCIUM: 8.8 mg/dL — AB (ref 8.9–10.3)
CO2: 24 mmol/L (ref 22–32)
Chloride: 97 mmol/L — ABNORMAL LOW (ref 101–111)
Creatinine, Ser: 5.83 mg/dL — ABNORMAL HIGH (ref 0.44–1.00)
GFR calc non Af Amer: 6 mL/min — ABNORMAL LOW (ref >60)
GFR, EST AFRICAN AMERICAN: 7 mL/min — AB (ref >60)
Glucose, Bld: 97 mg/dL (ref 65–99)
PHOSPHORUS: 5.4 mg/dL — AB (ref 2.5–4.6)
Potassium: 4.5 mmol/L (ref 3.5–5.1)
SODIUM: 135 mmol/L (ref 135–145)

## 2016-07-16 LAB — BASIC METABOLIC PANEL WITH GFR
Anion gap: 14 (ref 5–15)
BUN: 70 mg/dL — ABNORMAL HIGH (ref 6–20)
CO2: 24 mmol/L (ref 22–32)
Calcium: 8.9 mg/dL (ref 8.9–10.3)
Chloride: 98 mmol/L — ABNORMAL LOW (ref 101–111)
Creatinine, Ser: 5.82 mg/dL — ABNORMAL HIGH (ref 0.44–1.00)
GFR calc Af Amer: 7 mL/min — ABNORMAL LOW
GFR calc non Af Amer: 6 mL/min — ABNORMAL LOW
Glucose, Bld: 98 mg/dL (ref 65–99)
Potassium: 4.4 mmol/L (ref 3.5–5.1)
Sodium: 136 mmol/L (ref 135–145)

## 2016-07-16 LAB — TROPONIN I
Troponin I: 0.05 ng/mL
Troponin I: 0.06 ng/mL

## 2016-07-16 LAB — POTASSIUM
POTASSIUM: 5 mmol/L (ref 3.5–5.1)
POTASSIUM: 4.7 mmol/L (ref 3.5–5.1)
POTASSIUM: 4.5 mmol/L (ref 3.5–5.1)
Potassium: 4.4 mmol/L (ref 3.5–5.1)
Potassium: 5.3 mmol/L — ABNORMAL HIGH (ref 3.5–5.1)

## 2016-07-16 LAB — GLUCOSE, CAPILLARY: GLUCOSE-CAPILLARY: 89 mg/dL (ref 65–99)

## 2016-07-16 NOTE — Progress Notes (Signed)
CRITICAL VALUE ALERT  Critical value received:  Troponin 0.06  Date of notification:  07/16/2016  Time of notification:  1030  Critical value read back:Yes.    Nurse who received alert:  Jonetta SpeakElla Kadajah Kjos RN  MD notified (1st page):  Dr. Juanetta GoslingHawkins  Time of first page:  1100  MD notified (2nd page):  Time of second page:  Responding MD:  Dr Juanetta GoslingHawkins  Time MD responded:  1110

## 2016-07-16 NOTE — Progress Notes (Signed)
Subjective: Interval History: none.  Objective: Vital signs in last 24 hours: Temp:  [97 F (36.1 C)-99.2 F (37.3 C)] 98.2 F (36.8 C) (08/28 0758) Pulse Rate:  [70-85] 77 (08/28 0805) Resp:  [13-31] 18 (08/28 0800) BP: (95-186)/(53-99) 99/53 (08/28 0805) SpO2:  [92 %-100 %] 99 % (08/28 0500) Weight:  [53.9 kg (118 lb 13.3 oz)-56.2 kg (123 lb 14.4 oz)] 53.9 kg (118 lb 13.3 oz) (08/28 0500) Weight change:   Intake/Output from previous day: 08/27 0701 - 08/28 0700 In: 30 [P.O.:30] Out: 3001 [Stool:1] Intake/Output this shift: No intake/output data recorded.  General appearance: alert, cooperative and no distress Resp: clear to auscultation bilaterally Cardio: regular rate and rhythm, S1, S2 normal, no murmur, click, rub or gallop GI: soft, non-tender; bowel sounds normal; no masses,  no organomegaly Extremities: edema No edema  Lab Results:  Recent Labs  07/15/16 1624  WBC 7.3  HGB 11.8*  HCT 37.2  PLT 130*   BMET:  Recent Labs  07/15/16 1624 07/16/16 0449  NA 139 135  136  K 6.7* 4.5  4.4  4.4  CL 99* 97*  98*  CO2 18* 24  24  GLUCOSE 99 97  98  BUN 137* 70*  70*  CREATININE 10.76* 5.83*  5.82*  CALCIUM 9.0 8.8*  8.9   No results for input(s): PTH in the last 72 hours. Iron Studies: No results for input(s): IRON, TIBC, TRANSFERRIN, FERRITIN in the last 72 hours.  Studies/Results: Dg Chest Portable 1 View  Result Date: 07/15/2016 CLINICAL DATA:  Short breath EXAM: PORTABLE CHEST 1 VIEW COMPARISON:  Radiograph 02/01/2015 FINDINGS: Large or RIGHT central venous line tip in the RIGHT atrium. Venous line from the inferior approach with tip in the RIGHT atrium also. Vascular stent in LEFT subclavian vein or artery. There is dense LEFT basilar atelectasis. There is patchy airspace disease in the RIGHT upper lobe and RIGHT lower lobe. IMPRESSION: 1. Patchy airspace disease the RIGHT upper lobe RIGHT lower lobe representing edema or multifocal pneumonia. 2.  Dense LEFT basilar atelectasis representing pneumonia, edema or atelectasis. Electronically Signed   By: Genevive BiStewart  Edmunds M.D.   On: 07/15/2016 15:53    I have reviewed the patient's current medications.  Assessment/Plan: Problem #1 fluid overload: She is status post hemodialysis yesterday with 3 L fluid removal. Presently seems to be comfortable. Problem #2 hyperkalemia: Her potassium is normal today Problem #3 end-stage renal disease: Presently she denies any nausea or vomiting. Problem #4 anemia: Her hemoglobin is was our target goal Problem #5 metabolic bone disease: Her calcium and phosphorus is within our target goal.. Patient presently is on Renvela as a binder Problem #6 history of dementia  Problem #7 hypertension: Her blood pressure is reasonably controlled. Plan: Patient does require dialysis today 2] we'll dialyze her tomorrow 3: We'll check a renal panel in the morning.   LOS: 1 day   Sharronda Schweers S 07/16/2016,8:32 AM

## 2016-07-16 NOTE — Progress Notes (Signed)
Attempted to call report, nurse is  unavaliable for report and will return the call.

## 2016-07-16 NOTE — Care Management Important Message (Signed)
Important Message  Patient Details  Name: [name] MRN: [id number] Date of Birth: 06/09/1931   Medicare Important Message Given:  Yes    Malcolm Metrohildress, Madeleyn Schwimmer Demske, RN 07/16/2016, 3:12 PM

## 2016-07-16 NOTE — Progress Notes (Signed)
Subjective: She was busy yesterday with volume overload. She underwent dialysis yesterday evening and had 3 L removed. She looks comfortable now.  Objective: Vital signs in last 24 hours: Temp:  [97 F (36.1 C)-99.2 F (37.3 C)] 98.2 F (36.8 C) (08/28 0758) Pulse Rate:  [70-85] 75 (08/28 0500) Resp:  [13-31] 13 (08/28 0500) BP: (95-186)/(58-99) 122/61 (08/28 0500) SpO2:  [92 %-100 %] 99 % (08/28 0500) Weight:  [53.9 kg (118 lb 13.3 oz)-56.2 kg (123 lb 14.4 oz)] 53.9 kg (118 lb 13.3 oz) (08/28 0500) Weight change:     Intake/Output from previous day: 08/27 0701 - 08/28 0700 In: 30 [P.O.:30] Out: 3001 [Stool:1]  PHYSICAL EXAM General appearance: Sleepy but arousable. Confused Resp: rhonchi bilaterally Cardio: regular rate and rhythm, S1, S2 normal, no murmur, click, rub or gallop GI: soft, non-tender; bowel sounds normal; no masses,  no organomegaly Extremities: Trace edema  Lab Results:  Results for orders placed or performed during the hospital encounter of 07/15/16 (from the past 48 hour(s))  Basic metabolic panel     Status: Abnormal   Collection Time: 07/15/16  4:24 PM  Result Value Ref Range   Sodium 139 135 - 145 mmol/L   Potassium 6.7 (HH) 3.5 - 5.1 mmol/L    Comment: CRITICAL RESULT CALLED TO, READ BACK BY AND VERIFIED WITH:  BLACKBURN,C @ 1723 ON 07/15/16 BY JUW    Chloride 99 (L) 101 - 111 mmol/L   CO2 18 (L) 22 - 32 mmol/L   Glucose, Bld 99 65 - 99 mg/dL   BUN 137 (H) 6 - 20 mg/dL    Comment: RESULTS CONFIRMED BY MANUAL DILUTION   Creatinine, Ser 10.76 (H) 0.44 - 1.00 mg/dL   Calcium 9.0 8.9 - 10.3 mg/dL   GFR calc non Af Amer 3 (L) >60 mL/min   GFR calc Af Amer 3 (L) >60 mL/min    Comment: (NOTE) The eGFR has been calculated using the CKD EPI equation. This calculation has not been validated in all clinical situations. eGFR's persistently <60 mL/min signify possible Chronic Kidney Disease.    Anion gap 22 (H) 5 - 15  Troponin I     Status: Abnormal    Collection Time: 07/15/16  4:24 PM  Result Value Ref Range   Troponin I 0.04 (HH) <0.03 ng/mL    Comment: CRITICAL RESULT CALLED TO, READ BACK BY AND VERIFIED WITH:  BLACKBURN,C @ 1723 ON 07/15/16 BY JUW   Lactic acid, plasma     Status: None   Collection Time: 07/15/16  4:24 PM  Result Value Ref Range   Lactic Acid, Venous 1.1 0.5 - 1.9 mmol/L  CBC with Differential     Status: Abnormal   Collection Time: 07/15/16  4:24 PM  Result Value Ref Range   WBC 7.3 4.0 - 10.5 K/uL   RBC 4.03 3.87 - 5.11 MIL/uL   Hemoglobin 11.8 (L) 12.0 - 15.0 g/dL   HCT 37.2 36.0 - 46.0 %   MCV 92.3 78.0 - 100.0 fL   MCH 29.3 26.0 - 34.0 pg   MCHC 31.7 30.0 - 36.0 g/dL   RDW 15.6 (H) 11.5 - 15.5 %   Platelets 130 (L) 150 - 400 K/uL   Neutrophils Relative % 79 %   Neutro Abs 5.8 1.7 - 7.7 K/uL   Lymphocytes Relative 11 %   Lymphs Abs 0.8 0.7 - 4.0 K/uL   Monocytes Relative 7 %   Monocytes Absolute 0.5 0.1 - 1.0 K/uL   Eosinophils  Relative 3 %   Eosinophils Absolute 0.2 0.0 - 0.7 K/uL   Basophils Relative 0 %   Basophils Absolute 0.0 0.0 - 0.1 K/uL  Brain natriuretic peptide     Status: Abnormal   Collection Time: 07/15/16  4:24 PM  Result Value Ref Range   B Natriuretic Peptide 2,033.0 (H) 0.0 - 100.0 pg/mL  MRSA PCR Screening     Status: None   Collection Time: 07/15/16  6:30 PM  Result Value Ref Range   MRSA by PCR NEGATIVE NEGATIVE    Comment:        The GeneXpert MRSA Assay (FDA approved for NASAL specimens only), is one component of a comprehensive MRSA colonization surveillance program. It is not intended to diagnose MRSA infection nor to guide or monitor treatment for MRSA infections.   Lactic acid, plasma     Status: None   Collection Time: 07/15/16  7:53 PM  Result Value Ref Range   Lactic Acid, Venous 0.7 0.5 - 1.9 mmol/L  Troponin I     Status: Abnormal   Collection Time: 07/15/16  9:20 PM  Result Value Ref Range   Troponin I 0.05 (HH) <0.03 ng/mL    Comment: CRITICAL  VALUE NOTED.  VALUE IS CONSISTENT WITH PREVIOUSLY REPORTED AND CALLED VALUE.  Troponin I     Status: Abnormal   Collection Time: 07/16/16  2:37 AM  Result Value Ref Range   Troponin I 0.05 (HH) <0.03 ng/mL    Comment: CRITICAL VALUE NOTED.  VALUE IS CONSISTENT WITH PREVIOUSLY REPORTED AND CALLED VALUE.  Renal function panel     Status: Abnormal   Collection Time: 07/16/16  4:49 AM  Result Value Ref Range   Sodium 135 135 - 145 mmol/L   Potassium 4.5 3.5 - 5.1 mmol/L   Chloride 97 (L) 101 - 111 mmol/L   CO2 24 22 - 32 mmol/L   Glucose, Bld 97 65 - 99 mg/dL   BUN 70 (H) 6 - 20 mg/dL   Creatinine, Ser 5.83 (H) 0.44 - 1.00 mg/dL   Calcium 8.8 (L) 8.9 - 10.3 mg/dL   Phosphorus 5.4 (H) 2.5 - 4.6 mg/dL   Albumin 3.4 (L) 3.5 - 5.0 g/dL   GFR calc non Af Amer 6 (L) >60 mL/min   GFR calc Af Amer 7 (L) >60 mL/min    Comment: (NOTE) The eGFR has been calculated using the CKD EPI equation. This calculation has not been validated in all clinical situations. eGFR's persistently <60 mL/min signify possible Chronic Kidney Disease.    Anion gap 14 5 - 15  Potassium     Status: None   Collection Time: 07/16/16  4:49 AM  Result Value Ref Range   Potassium 4.4 3.5 - 5.1 mmol/L    Comment: DELTA CHECK NOTED  Basic metabolic panel     Status: Abnormal   Collection Time: 07/16/16  4:49 AM  Result Value Ref Range   Sodium 136 135 - 145 mmol/L   Potassium 4.4 3.5 - 5.1 mmol/L   Chloride 98 (L) 101 - 111 mmol/L   CO2 24 22 - 32 mmol/L   Glucose, Bld 98 65 - 99 mg/dL   BUN 70 (H) 6 - 20 mg/dL   Creatinine, Ser 5.82 (H) 0.44 - 1.00 mg/dL    Comment: DELTA CHECK NOTED   Calcium 8.9 8.9 - 10.3 mg/dL   GFR calc non Af Amer 6 (L) >60 mL/min   GFR calc Af Amer 7 (L) >60 mL/min  Comment: (NOTE) The eGFR has been calculated using the CKD EPI equation. This calculation has not been validated in all clinical situations. eGFR's persistently <60 mL/min signify possible Chronic Kidney Disease.    Anion  gap 14 5 - 15  Glucose, capillary     Status: None   Collection Time: 07/16/16  7:22 AM  Result Value Ref Range   Glucose-Capillary 89 65 - 99 mg/dL   Comment 1 Notify RN    Comment 2 Document in Chart     ABGS No results for input(s): PHART, PO2ART, TCO2, HCO3 in the last 72 hours.  Invalid input(s): PCO2 CULTURES Recent Results (from the past 240 hour(s))  MRSA PCR Screening     Status: None   Collection Time: 07/15/16  6:30 PM  Result Value Ref Range Status   MRSA by PCR NEGATIVE NEGATIVE Final    Comment:        The GeneXpert MRSA Assay (FDA approved for NASAL specimens only), is one component of a comprehensive MRSA colonization surveillance program. It is not intended to diagnose MRSA infection nor to guide or monitor treatment for MRSA infections.    Studies/Results: Dg Chest Portable 1 View  Result Date: 07/15/2016 CLINICAL DATA:  Short breath EXAM: PORTABLE CHEST 1 VIEW COMPARISON:  Radiograph 02/01/2015 FINDINGS: Large or RIGHT central venous line tip in the RIGHT atrium. Venous line from the inferior approach with tip in the RIGHT atrium also. Vascular stent in LEFT subclavian vein or artery. There is dense LEFT basilar atelectasis. There is patchy airspace disease in the RIGHT upper lobe and RIGHT lower lobe. IMPRESSION: 1. Patchy airspace disease the RIGHT upper lobe RIGHT lower lobe representing edema or multifocal pneumonia. 2. Dense LEFT basilar atelectasis representing pneumonia, edema or atelectasis. Electronically Signed   By: Suzy Bouchard M.D.   On: 07/15/2016 15:53    Medications:  Prior to Admission:  Prescriptions Prior to Admission  Medication Sig Dispense Refill Last Dose  . acetaminophen (TYLENOL) 325 MG tablet Take 650 mg by mouth every 6 (six) hours as needed for mild pain.   Past Month at Unknown time  . ALPRAZolam (XANAX) 0.5 MG tablet Take 0.5 mg by mouth daily as needed for anxiety.    unknown  . Amino Acid Infusion (PROSOL) 20 % SOLN  Inject 4 L into the vein 7 days.   unknown  . carvedilol (COREG) 3.125 MG tablet Take 3.125 mg by mouth 2 (two) times daily with a meal.    07/15/2016 at 0830  . clopidogrel (PLAVIX) 75 MG tablet Take 75 mg by mouth daily.    07/15/2016 at 0830  . diazepam (VALIUM) 5 MG tablet Take 5 mg by mouth daily as needed for anxiety.    unknown  . donepezil (ARICEPT) 10 MG tablet Take 10 mg by mouth at bedtime.   07/15/2016 at Unknown time  . Memantine HCl ER (NAMENDA XR) 28 MG CP24 Take 28 mg by mouth daily.   07/15/2016 at Unknown time  . multivitamin (RENA-VIT) TABS tablet Take 1 tablet by mouth daily.   07/15/2016 at Unknown time  . sevelamer carbonate (RENVELA) 2.4 G PACK Take 2.4 g by mouth 3 (three) times daily with meals.   07/15/2016 at Unknown time  . torsemide (DEMADEX) 20 MG tablet Take 20 mg by mouth daily.   07/14/2016 at Unknown time  . HYDROcodone-acetaminophen (NORCO) 5-325 MG tablet Take 1 tablet by mouth every 6 (six) hours as needed for moderate pain or severe pain. (Patient  not taking: Reported on 07/15/2016) 50 tablet 0 Not Taking at Unknown time   Scheduled: . carvedilol  3.125 mg Oral BID WC  . clopidogrel  75 mg Oral Daily  . donepezil  10 mg Oral QHS  . heparin  5,000 Units Subcutaneous Q8H  . memantine  28 mg Oral Daily  . multivitamin  1 tablet Oral Daily  . sevelamer carbonate  2.4 g Oral TID WC  . sodium chloride flush  3 mL Intravenous Q12H  . sodium chloride flush  3 mL Intravenous Q12H  . torsemide  20 mg Oral Daily   Continuous: . albuterol 10 mg/hr (07/15/16 1800)   QHK:UVJDYN chloride, sodium chloride, sodium chloride, acetaminophen **OR** acetaminophen, ALPRAZolam, alteplase, bisacodyl, heparin, heparin, HYDROcodone-acetaminophen, lidocaine (PF), lidocaine-prilocaine, ondansetron **OR** ondansetron (ZOFRAN) IV, pentafluoroprop-tetrafluoroeth, polyethylene glycol, sodium chloride flush  Assesment: She was admitted with fluid overload. This is related to her end-stage  renal disease requiring dialysis. She has been transitioning to peritoneal dialysis. She is known to have chronic congestive heart failure which is systolic. She has dementia and is confused. She is better as far as volume overload is concerned. Principal Problem:   Fluid overload Active Problems:   CKD (chronic kidney disease) stage V requiring chronic dialysis (HCC)   Volume overload   Thrombocytopenia (HCC)   Anemia of chronic disease   Dementia   CAD (coronary artery disease)   Chronic systolic CHF (congestive heart failure) (HCC)   Hyperkalemia   Elevated troponin   ESRD on hemodialysis ([place])    Plan: Continue current treatments. No change.    LOS: 1 day   [name] L 07/16/2016, 8:01 AM

## 2016-07-16 NOTE — Progress Notes (Signed)
Report called to [facility]Becky RN on unit 300. Patient to be transferred to RM 306.

## 2016-07-17 LAB — RENAL FUNCTION PANEL
Albumin: 3.2 g/dL — ABNORMAL LOW (ref 3.5–5.0)
Anion gap: 15 (ref 5–15)
BUN: 96 mg/dL — AB (ref 6–20)
CALCIUM: 8 mg/dL — AB (ref 8.9–10.3)
CO2: 24 mmol/L (ref 22–32)
CREATININE: 7.23 mg/dL — AB (ref 0.44–1.00)
Chloride: 99 mmol/L — ABNORMAL LOW (ref 101–111)
GFR calc non Af Amer: 5 mL/min — ABNORMAL LOW (ref >60)
GFR, EST AFRICAN AMERICAN: 5 mL/min — AB (ref >60)
GLUCOSE: 96 mg/dL (ref 65–99)
Phosphorus: 8.5 mg/dL — ABNORMAL HIGH (ref 2.5–4.6)
Potassium: 4.6 mmol/L (ref 3.5–5.1)
SODIUM: 138 mmol/L (ref 135–145)

## 2016-07-17 LAB — CBC
HCT: 33.5 % — ABNORMAL LOW (ref 36.0–46.0)
HEMOGLOBIN: 10.6 g/dL — AB (ref 12.0–15.0)
MCH: 29.1 pg (ref 26.0–34.0)
MCHC: 31.6 g/dL (ref 30.0–36.0)
MCV: 92 fL (ref 78.0–100.0)
PLATELETS: 106 10*3/uL — AB (ref 150–400)
RBC: 3.64 MIL/uL — AB (ref 3.87–5.11)
RDW: 15.9 % — ABNORMAL HIGH (ref 11.5–15.5)
WBC: 4.6 10*3/uL (ref 4.0–10.5)

## 2016-07-17 LAB — GLUCOSE, CAPILLARY: Glucose-Capillary: 109 mg/dL — ABNORMAL HIGH (ref 65–99)

## 2016-07-17 LAB — POTASSIUM
POTASSIUM: 4.7 mmol/L (ref 3.5–5.1)
POTASSIUM: 4.5 mmol/L (ref 3.5–5.1)
Potassium: 4.6 mmol/L (ref 3.5–5.1)

## 2016-07-17 LAB — HEPATITIS B SURFACE ANTIGEN: HEP B S AG: NEGATIVE

## 2016-07-17 MED ORDER — SODIUM CHLORIDE 0.9 % IV SOLN: 100.0000 mL | INTRAVENOUS | Status: DC | PRN

## 2016-07-17 MED ORDER — HEPARIN SODIUM (PORCINE) 1000 UNIT/ML IJ SOLN
INTRAMUSCULAR | Status: AC
  Filled 2016-07-17: qty 10

## 2016-07-17 NOTE — Care Management Note (Addendum)
Case Management Note  Patient Details  Name: [name] MRN: [id number] Date of Birth: 03/30/1931  Subjective/Objective:                  Pt admitted with volume overload. She is from home, and has 24/7 care provided by son and PD sitters. She is receiving HD three days week and is in the process of transitioning to PD. Pt has deferred all DC planning to her son. He states pt has home oxygen PTA. She has walker and WC but usually ambulates with stand by assistance. [facility] Pt has uses [facility] in the past and family would like to use them again. [facility]. [name] and will obtain pt info from chart. Pt's son plans to transport her home via car. Potential DC home today after HD.    Action/Plan[facility]   Expected Discharge Date:  07/19/16               Expected Discharge Plan:  Home w Home Health Services  In-House Referral:  NA  Discharge planning Services  CM Consult  Post Acute Care Choice:  Home Health Choice offered to:  Adult Children  DME Arranged:    DME Agency:     HH Arranged:  RN, PT HH Agency:  [facility]  Status of Service:  Completed, signed off  If discussed at Long Length of Stay Meetings, dates discussed:    Additional Comments:  [name], Deanna Urbach Demske, RN 07/17/2016, 2:27 PM

## 2016-07-17 NOTE — Procedures (Signed)
   HEMODIALYSIS TREATMENT NOTE:  3.5 hour low-heparin dialysis completed via left femoral tunneled catheter. Exit site unremarkable. Goal NOT met: Unable to tolerate removal of 2.5 liters as ordered.  Ultrafiltration was suspended or rate was decreased in response to hypotension.  Net UF 2.1 liters.  All blood was returned.  Report called to Morene Antu, RN.  Rockwell Alexandria, RN, CDN

## 2016-07-17 NOTE — Progress Notes (Signed)
Subjective: Interval History:  the patient offers no complaints. Objective: Vital signs in last 24 hours: Temp:  [97.5 F (36.4 C)-98.9 F (37.2 C)] 97.5 F (36.4 C) (08/29 13240619) Pulse Rate:  [57-73] 57 (08/29 0801) Resp:  [15-22] 22 (08/29 0619) BP: (115-168)/(50-88) 125/53 (08/29 0801) SpO2:  [99 %-100 %] 100 % (08/29 0619) Weight:  [54.2 kg (119 lb 7.8 oz)] 54.2 kg (119 lb 7.8 oz) (08/29 0619) Weight change: -0.8 kg (-1 lb 12.2 oz)  Intake/Output from previous day: 08/28 0701 - 08/29 0700 In: -  Out: 3 [Stool:3] Intake/Output this shift: No intake/output data recorded.  General appearance: alert, cooperative and no distress Resp: clear to auscultation bilaterally Cardio: regular rate and rhythm, S1, S2 normal, no murmur, click, rub or gallop GI: soft, non-tender; bowel sounds normal; no masses,  no organomegaly Extremities: edema No edema  Lab Results:  Recent Labs  07/15/16 1624  WBC 7.3  HGB 11.8*  HCT 37.2  PLT 130*   BMET:  Recent Labs  07/16/16 0449  07/17/16 0047 07/17/16 0457  NA 135  136  --   --  138  K 4.5  4.4  4.4  < > 4.7 4.6  4.5  CL 97*  98*  --   --  99*  CO2 24  24  --   --  24  GLUCOSE 97  98  --   --  96  BUN 70*  70*  --   --  96*  CREATININE 5.83*  5.82*  --   --  7.23*  CALCIUM 8.8*  8.9  --   --  8.0*  < > = values in this interval not displayed. No results for input(s): PTH in the last 72 hours. Iron Studies: No results for input(s): IRON, TIBC, TRANSFERRIN, FERRITIN in the last 72 hours.  Studies/Results: Dg Chest Portable 1 View  Result Date: 07/15/2016 CLINICAL DATA:  Short breath EXAM: PORTABLE CHEST 1 VIEW COMPARISON:  Radiograph 02/01/2015 FINDINGS: Large or RIGHT central venous line tip in the RIGHT atrium. Venous line from the inferior approach with tip in the RIGHT atrium also. Vascular stent in LEFT subclavian vein or artery. There is dense LEFT basilar atelectasis. There is patchy airspace disease in the RIGHT  upper lobe and RIGHT lower lobe. IMPRESSION: 1. Patchy airspace disease the RIGHT upper lobe RIGHT lower lobe representing edema or multifocal pneumonia. 2. Dense LEFT basilar atelectasis representing pneumonia, edema or atelectasis. Electronically Signed   By: Genevive BiStewart  Edmunds M.D.   On: 07/15/2016 15:53    I have reviewed the patient's current medications.  Assessment/Plan: Problem #1 fluid overload: patient denies any difficulty in breathing. She was status post hemodialysis on Sunday. Problem #2 hyperkalemia: Her potassium is normal today Problem #3 end-stage renal disease: no history of uremia. Her BUN is 96. Problem #4 anemia: Her hemoglobin is was our target goal Problem #5 metabolic bone disease: Her calcium and phosphorus is within our target goal..  Problem #6 history of dementia: severe  Problem #7 hypertension: Her blood pressure is reasonably controlled. Plan: 1] We will dialyze patient for 3-1/2 hours today 2] would remove about 2 and half liters his systolic blood pressure remains above 90.  3: We'll check a renal panel  and CBCin the morning.   LOS: 2 days   [name] S 07/17/2016,8:08 AM

## 2016-07-17 NOTE — Progress Notes (Signed)
Subjective: She says she feels okay and has no complaints. Plan is for dialysis today.  Objective: Vital signs in last 24 hours: Temp:  [97.5 F (36.4 C)-98.9 F (37.2 C)] 97.5 F (36.4 C) (08/29 0737) Pulse Rate:  [57-73] 57 (08/29 0801) Resp:  [15-22] 22 (08/29 0619) BP: (115-168)/(50-88) 125/53 (08/29 0801) SpO2:  [99 %-100 %] 100 % (08/29 0619) Weight:  [54.2 kg (119 lb 7.8 oz)] 54.2 kg (119 lb 7.8 oz) (08/29 0619) Weight change: -0.8 kg (-1 lb 12.2 oz)    Intake/Output from previous day: 08/28 0701 - 08/29 0700 In: -  Out: 3 [Stool:3]  PHYSICAL EXAM General appearance: alert and Confused Resp: clear to auscultation bilaterally Cardio: regular rate and rhythm, S1, S2 normal, no murmur, click, rub or gallop GI: soft, non-tender; bowel sounds normal; no masses,  no organomegaly Extremities: extremities normal, atraumatic, no cyanosis or edema  Lab Results:  Results for orders placed or performed during the hospital encounter of 07/15/16 (from the past 48 hour(s))  Basic metabolic panel     Status: Abnormal   Collection Time: 07/15/16  4:24 PM  Result Value Ref Range   Sodium 139 135 - 145 mmol/L   Potassium 6.7 (HH) 3.5 - 5.1 mmol/L    Comment: CRITICAL RESULT CALLED TO, READ BACK BY AND VERIFIED WITH:  BLACKBURN,C @ 1723 ON 07/15/16 BY JUW    Chloride 99 (L) 101 - 111 mmol/L   CO2 18 (L) 22 - 32 mmol/L   Glucose, Bld 99 65 - 99 mg/dL   BUN 137 (H) 6 - 20 mg/dL    Comment: RESULTS CONFIRMED BY MANUAL DILUTION   Creatinine, Ser 10.76 (H) 0.44 - 1.00 mg/dL   Calcium 9.0 8.9 - 10.3 mg/dL   GFR calc non Af Amer 3 (L) >60 mL/min   GFR calc Af Amer 3 (L) >60 mL/min    Comment: (NOTE) The eGFR has been calculated using the CKD EPI equation. This calculation has not been validated in all clinical situations. eGFR's persistently <60 mL/min signify possible Chronic Kidney Disease.    Anion gap 22 (H) 5 - 15  Troponin I     Status: Abnormal   Collection Time: 07/15/16   4:24 PM  Result Value Ref Range   Troponin I 0.04 (HH) <0.03 ng/mL    Comment: CRITICAL RESULT CALLED TO, READ BACK BY AND VERIFIED WITH:  BLACKBURN,C @ 1723 ON 07/15/16 BY JUW   Lactic acid, plasma     Status: None   Collection Time: 07/15/16  4:24 PM  Result Value Ref Range   Lactic Acid, Venous 1.1 0.5 - 1.9 mmol/L  CBC with Differential     Status: Abnormal   Collection Time: 07/15/16  4:24 PM  Result Value Ref Range   WBC 7.3 4.0 - 10.5 K/uL   RBC 4.03 3.87 - 5.11 MIL/uL   Hemoglobin 11.8 (L) 12.0 - 15.0 g/dL   HCT 37.2 36.0 - 46.0 %   MCV 92.3 78.0 - 100.0 fL   MCH 29.3 26.0 - 34.0 pg   MCHC 31.7 30.0 - 36.0 g/dL   RDW 15.6 (H) 11.5 - 15.5 %   Platelets 130 (L) 150 - 400 K/uL   Neutrophils Relative % 79 %   Neutro Abs 5.8 1.7 - 7.7 K/uL   Lymphocytes Relative 11 %   Lymphs Abs 0.8 0.7 - 4.0 K/uL   Monocytes Relative 7 %   Monocytes Absolute 0.5 0.1 - 1.0 K/uL   Eosinophils Relative  3 %   Eosinophils Absolute 0.2 0.0 - 0.7 K/uL   Basophils Relative 0 %   Basophils Absolute 0.0 0.0 - 0.1 K/uL  Brain natriuretic peptide     Status: Abnormal   Collection Time: 07/15/16  4:24 PM  Result Value Ref Range   B Natriuretic Peptide 2,033.0 (H) 0.0 - 100.0 pg/mL  MRSA PCR Screening     Status: None   Collection Time: 07/15/16  6:30 PM  Result Value Ref Range   MRSA by PCR NEGATIVE NEGATIVE    Comment:        The GeneXpert MRSA Assay (FDA approved for NASAL specimens only), is one component of a comprehensive MRSA colonization surveillance program. It is not intended to diagnose MRSA infection nor to guide or monitor treatment for MRSA infections.   Lactic acid, plasma     Status: None   Collection Time: 07/15/16  7:53 PM  Result Value Ref Range   Lactic Acid, Venous 0.7 0.5 - 1.9 mmol/L  Troponin I     Status: Abnormal   Collection Time: 07/15/16  9:20 PM  Result Value Ref Range   Troponin I 0.05 (HH) <0.03 ng/mL    Comment: CRITICAL VALUE NOTED.  VALUE IS  CONSISTENT WITH PREVIOUSLY REPORTED AND CALLED VALUE.  Troponin I     Status: Abnormal   Collection Time: 07/16/16  2:37 AM  Result Value Ref Range   Troponin I 0.05 (HH) <0.03 ng/mL    Comment: CRITICAL VALUE NOTED.  VALUE IS CONSISTENT WITH PREVIOUSLY REPORTED AND CALLED VALUE.  Renal function panel     Status: Abnormal   Collection Time: 07/16/16  4:49 AM  Result Value Ref Range   Sodium 135 135 - 145 mmol/L   Potassium 4.5 3.5 - 5.1 mmol/L   Chloride 97 (L) 101 - 111 mmol/L   CO2 24 22 - 32 mmol/L   Glucose, Bld 97 65 - 99 mg/dL   BUN 70 (H) 6 - 20 mg/dL   Creatinine, Ser 5.83 (H) 0.44 - 1.00 mg/dL   Calcium 8.8 (L) 8.9 - 10.3 mg/dL   Phosphorus 5.4 (H) 2.5 - 4.6 mg/dL   Albumin 3.4 (L) 3.5 - 5.0 g/dL   GFR calc non Af Amer 6 (L) >60 mL/min   GFR calc Af Amer 7 (L) >60 mL/min    Comment: (NOTE) The eGFR has been calculated using the CKD EPI equation. This calculation has not been validated in all clinical situations. eGFR's persistently <60 mL/min signify possible Chronic Kidney Disease.    Anion gap 14 5 - 15  Potassium     Status: None   Collection Time: 07/16/16  4:49 AM  Result Value Ref Range   Potassium 4.4 3.5 - 5.1 mmol/L    Comment: DELTA CHECK NOTED  Basic metabolic panel     Status: Abnormal   Collection Time: 07/16/16  4:49 AM  Result Value Ref Range   Sodium 136 135 - 145 mmol/L   Potassium 4.4 3.5 - 5.1 mmol/L   Chloride 98 (L) 101 - 111 mmol/L   CO2 24 22 - 32 mmol/L   Glucose, Bld 98 65 - 99 mg/dL   BUN 70 (H) 6 - 20 mg/dL   Creatinine, Ser 5.82 (H) 0.44 - 1.00 mg/dL    Comment: DELTA CHECK NOTED   Calcium 8.9 8.9 - 10.3 mg/dL   GFR calc non Af Amer 6 (L) >60 mL/min   GFR calc Af Amer 7 (L) >60 mL/min  Comment: (NOTE) The eGFR has been calculated using the CKD EPI equation. This calculation has not been validated in all clinical situations. eGFR's persistently <60 mL/min signify possible Chronic Kidney Disease.    Anion gap 14 5 - 15   Glucose, capillary     Status: None   Collection Time: 07/16/16  7:22 AM  Result Value Ref Range   Glucose-Capillary 89 65 - 99 mg/dL   Comment 1 Notify RN    Comment 2 Document in Chart   Potassium     Status: None   Collection Time: 07/16/16  9:35 AM  Result Value Ref Range   Potassium 5.0 3.5 - 5.1 mmol/L  Troponin I     Status: Abnormal   Collection Time: 07/16/16  9:35 AM  Result Value Ref Range   Troponin I 0.06 (HH) <0.03 ng/mL    Comment: CRITICAL RESULT CALLED TO, READ BACK BY AND VERIFIED WITH: BETHEL,E AT 1026 BY HUFFINES,S ON 07/16/16.   Potassium     Status: Abnormal   Collection Time: 07/16/16 12:45 PM  Result Value Ref Range   Potassium 5.3 (H) 3.5 - 5.1 mmol/L  Potassium     Status: None   Collection Time: 07/16/16  4:58 PM  Result Value Ref Range   Potassium 4.7 3.5 - 5.1 mmol/L  Potassium     Status: None   Collection Time: 07/16/16  8:50 PM  Result Value Ref Range   Potassium 4.5 3.5 - 5.1 mmol/L  Potassium     Status: None   Collection Time: 07/17/16 12:47 AM  Result Value Ref Range   Potassium 4.7 3.5 - 5.1 mmol/L  Potassium     Status: None   Collection Time: 07/17/16  4:57 AM  Result Value Ref Range   Potassium 4.5 3.5 - 5.1 mmol/L  Renal function panel     Status: Abnormal   Collection Time: 07/17/16  4:57 AM  Result Value Ref Range   Sodium 138 135 - 145 mmol/L   Potassium 4.6 3.5 - 5.1 mmol/L   Chloride 99 (L) 101 - 111 mmol/L   CO2 24 22 - 32 mmol/L   Glucose, Bld 96 65 - 99 mg/dL   BUN 96 (H) 6 - 20 mg/dL   Creatinine, Ser 7.23 (H) 0.44 - 1.00 mg/dL   Calcium 8.0 (L) 8.9 - 10.3 mg/dL   Phosphorus 8.5 (H) 2.5 - 4.6 mg/dL   Albumin 3.2 (L) 3.5 - 5.0 g/dL   GFR calc non Af Amer 5 (L) >60 mL/min   GFR calc Af Amer 5 (L) >60 mL/min    Comment: (NOTE) The eGFR has been calculated using the CKD EPI equation. This calculation has not been validated in all clinical situations. eGFR's persistently <60 mL/min signify possible Chronic  Kidney Disease.    Anion gap 15 5 - 15  Glucose, capillary     Status: Abnormal   Collection Time: 07/17/16  7:38 AM  Result Value Ref Range   Glucose-Capillary 109 (H) 65 - 99 mg/dL   Comment 1 Notify RN    Comment 2 Document in Chart     ABGS No results for input(s): PHART, PO2ART, TCO2, HCO3 in the last 72 hours.  Invalid input(s): PCO2 CULTURES Recent Results (from the past 240 hour(s))  MRSA PCR Screening     Status: None   Collection Time: 07/15/16  6:30 PM  Result Value Ref Range Status   MRSA by PCR NEGATIVE NEGATIVE Final    Comment:  The GeneXpert MRSA Assay (FDA approved for NASAL specimens only), is one component of a comprehensive MRSA colonization surveillance program. It is not intended to diagnose MRSA infection nor to guide or monitor treatment for MRSA infections.    Studies/Results: Dg Chest Portable 1 View  Result Date: 07/15/2016 CLINICAL DATA:  Short breath EXAM: PORTABLE CHEST 1 VIEW COMPARISON:  Radiograph 02/01/2015 FINDINGS: Large or RIGHT central venous line tip in the RIGHT atrium. Venous line from the inferior approach with tip in the RIGHT atrium also. Vascular stent in LEFT subclavian vein or artery. There is dense LEFT basilar atelectasis. There is patchy airspace disease in the RIGHT upper lobe and RIGHT lower lobe. IMPRESSION: 1. Patchy airspace disease the RIGHT upper lobe RIGHT lower lobe representing edema or multifocal pneumonia. 2. Dense LEFT basilar atelectasis representing pneumonia, edema or atelectasis. Electronically Signed   By: Suzy Bouchard M.D.   On: 07/15/2016 15:53    Medications:  Prior to Admission:  Prescriptions Prior to Admission  Medication Sig Dispense Refill Last Dose  . acetaminophen (TYLENOL) 325 MG tablet Take 650 mg by mouth every 6 (six) hours as needed for mild pain.   Past Month at Unknown time  . ALPRAZolam (XANAX) 0.5 MG tablet Take 0.5 mg by mouth daily as needed for anxiety.    unknown  . Amino  Acid Infusion (PROSOL) 20 % SOLN Inject 4 L into the vein 7 days.   unknown  . carvedilol (COREG) 3.125 MG tablet Take 3.125 mg by mouth 2 (two) times daily with a meal.    07/15/2016 at 0830  . clopidogrel (PLAVIX) 75 MG tablet Take 75 mg by mouth daily.    07/15/2016 at 0830  . diazepam (VALIUM) 5 MG tablet Take 5 mg by mouth daily as needed for anxiety.    unknown  . donepezil (ARICEPT) 10 MG tablet Take 10 mg by mouth at bedtime.   07/15/2016 at Unknown time  . Memantine HCl ER (NAMENDA XR) 28 MG CP24 Take 28 mg by mouth daily.   07/15/2016 at Unknown time  . multivitamin (RENA-VIT) TABS tablet Take 1 tablet by mouth daily.   07/15/2016 at Unknown time  . sevelamer carbonate (RENVELA) 2.4 G PACK Take 2.4 g by mouth 3 (three) times daily with meals.   07/15/2016 at Unknown time  . torsemide (DEMADEX) 20 MG tablet Take 20 mg by mouth daily.   07/14/2016 at Unknown time  . HYDROcodone-acetaminophen (NORCO) 5-325 MG tablet Take 1 tablet by mouth every 6 (six) hours as needed for moderate pain or severe pain. (Patient not taking: Reported on 07/15/2016) 50 tablet 0 Not Taking at Unknown time   Scheduled: . carvedilol  3.125 mg Oral BID WC  . clopidogrel  75 mg Oral Daily  . donepezil  10 mg Oral QHS  . heparin  5,000 Units Subcutaneous Q8H  . memantine  28 mg Oral Daily  . multivitamin  1 tablet Oral Daily  . sevelamer carbonate  2.4 g Oral TID WC  . sodium chloride flush  3 mL Intravenous Q12H  . sodium chloride flush  3 mL Intravenous Q12H  . torsemide  20 mg Oral Daily   Continuous: . albuterol 10 mg/hr (07/15/16 1800)   AGT:XMIWOE chloride, sodium chloride, sodium chloride, acetaminophen **OR** acetaminophen, ALPRAZolam, alteplase, bisacodyl, heparin, heparin, HYDROcodone-acetaminophen, lidocaine (PF), lidocaine-prilocaine, ondansetron **OR** ondansetron (ZOFRAN) IV, pentafluoroprop-tetrafluoroeth, polyethylene glycol, sodium chloride flush  Assesment: She was admitted with volume overload.  She is currently switching from hemodialysis to  peritoneal dialysis. She was hemodialyzed on the 27th and is doing better. Plan is for dialysis again today.  She has a history of chronic systolic heart failure.  She has significant dementia  Her troponin level was elevated and this is felt to be related to her chronic kidney disease and no further workup is intended Principal Problem:   Fluid overload Active Problems:   CKD (chronic kidney disease) stage V requiring chronic dialysis (HCC)   Volume overload   Thrombocytopenia (HCC)   Anemia of chronic disease   Dementia   CAD (coronary artery disease)   Chronic systolic CHF (congestive heart failure) (HCC)   Hyperkalemia   Elevated troponin   ESRD on hemodialysis ([facility])    Plan: Dialysis today. Potential discharge tomorrow.    LOS: 2 days   Annisha Baar L 07/17/2016, 8:31 AM

## 2016-07-18 LAB — CBC
HCT: 35.1 % — ABNORMAL LOW (ref 36.0–46.0)
Hemoglobin: 11 g/dL — ABNORMAL LOW (ref 12.0–15.0)
MCH: 29.1 pg (ref 26.0–34.0)
MCHC: 31.3 g/dL (ref 30.0–36.0)
MCV: 92.9 fL (ref 78.0–100.0)
Platelets: 106 10*3/uL — ABNORMAL LOW (ref 150–400)
RBC: 3.78 MIL/uL — ABNORMAL LOW (ref 3.87–5.11)
RDW: 15.6 % — ABNORMAL HIGH (ref 11.5–15.5)
WBC: 5.1 10*3/uL (ref 4.0–10.5)

## 2016-07-18 LAB — RENAL FUNCTION PANEL
ALBUMIN: 3.2 g/dL — AB (ref 3.5–5.0)
ANION GAP: 11 (ref 5–15)
BUN: 45 mg/dL — ABNORMAL HIGH (ref 6–20)
CALCIUM: 8.8 mg/dL — AB (ref 8.9–10.3)
CO2: 26 mmol/L (ref 22–32)
Chloride: 96 mmol/L — ABNORMAL LOW (ref 101–111)
Creatinine, Ser: 4.58 mg/dL — ABNORMAL HIGH (ref 0.44–1.00)
GFR calc non Af Amer: 8 mL/min — ABNORMAL LOW (ref >60)
GFR, EST AFRICAN AMERICAN: 9 mL/min — AB (ref >60)
Glucose, Bld: 94 mg/dL (ref 65–99)
PHOSPHORUS: 6.1 mg/dL — AB (ref 2.5–4.6)
Potassium: 4 mmol/L (ref 3.5–5.1)
SODIUM: 133 mmol/L — AB (ref 135–145)

## 2016-07-18 LAB — GLUCOSE, CAPILLARY: GLUCOSE-CAPILLARY: 79 mg/dL (ref 65–99)

## 2016-07-18 NOTE — Progress Notes (Signed)
AVS reviewed with patient's caregiver Thayer Ohm(Chris) per Maxwell Marionavis Pryce, son.  Verbalized understanding of discharge instructions, physician follow-up, medications.  Patient's IV removed.  Site WNL.  Dressing to femoral and peritoneal access CDI at discharge.  Reports belongings intact and in possession at time of discharge.  Patient transported by NT via wheelchair to main entrance for discharge.  Patient's caregiver to provide transport home per son, Trilby LeaverDavid Lwin.  Caregiver provider home O2 for transport home.  Patient stable at time of discharge.

## 2016-07-18 NOTE — Progress Notes (Signed)
Subjective:  She feels better. She did not tolerate removal of 2.5 L which was the goal yesterday.  Objective: Vital signs in last 24 hours: Temp:  [97.6 F (36.4 C)-98.7 F (37.1 C)] 98.6 F (37 C) (08/30 0627) Pulse Rate:  [59-71] 61 (08/30 0627) Resp:  [16-18] 18 (08/30 0627) BP: (88-145)/(40-74) 117/40 (08/30 0627) SpO2:  [99 %-100 %] 100 % (08/30 0627) Weight:  [54.4 kg (119 lb 14.9 oz)-54.9 kg (121 lb 0.5 oz)] 54.9 kg (121 lb 0.5 oz) (08/30 0700) Weight change: 0.2 kg (7.1 oz)    Intake/Output from previous day: 08/29 0701 - 08/30 0700 In: -  Out: 2055   PHYSICAL EXAM General appearance: alert and no distress Resp: clear to auscultation bilaterally Cardio: regular rate and rhythm, S1, S2 normal, no murmur, click, rub or gallop GI: soft, non-tender; bowel sounds normal; no masses,  no organomegaly Extremities: extremities normal, atraumatic, no cyanosis or edema  Lab Results:  Results for orders placed or performed during the hospital encounter of 07/15/16 (from the past 48 hour(s))  Potassium     Status: None   Collection Time: 07/16/16  9:35 AM  Result Value Ref Range   Potassium 5.0 3.5 - 5.1 mmol/L  Troponin I     Status: Abnormal   Collection Time: 07/16/16  9:35 AM  Result Value Ref Range   Troponin I 0.06 (HH) <0.03 ng/mL    Comment: CRITICAL RESULT CALLED TO, READ BACK BY AND VERIFIED WITH: BETHEL,E AT 1026 BY HUFFINES,S ON 07/16/16.   Potassium     Status: Abnormal   Collection Time: 07/16/16 12:45 PM  Result Value Ref Range   Potassium 5.3 (H) 3.5 - 5.1 mmol/L  Potassium     Status: None   Collection Time: 07/16/16  4:58 PM  Result Value Ref Range   Potassium 4.7 3.5 - 5.1 mmol/L  Potassium     Status: None   Collection Time: 07/16/16  8:50 PM  Result Value Ref Range   Potassium 4.5 3.5 - 5.1 mmol/L  Potassium     Status: None   Collection Time: 07/17/16 12:47 AM  Result Value Ref Range   Potassium 4.7 3.5 - 5.1 mmol/L  Potassium     Status:  None   Collection Time: 07/17/16  4:57 AM  Result Value Ref Range   Potassium 4.5 3.5 - 5.1 mmol/L  Renal function panel     Status: Abnormal   Collection Time: 07/17/16  4:57 AM  Result Value Ref Range   Sodium 138 135 - 145 mmol/L   Potassium 4.6 3.5 - 5.1 mmol/L   Chloride 99 (L) 101 - 111 mmol/L   CO2 24 22 - 32 mmol/L   Glucose, Bld 96 65 - 99 mg/dL   BUN 96 (H) 6 - 20 mg/dL   Creatinine, Ser 7.23 (H) 0.44 - 1.00 mg/dL   Calcium 8.0 (L) 8.9 - 10.3 mg/dL   Phosphorus 8.5 (H) 2.5 - 4.6 mg/dL   Albumin 3.2 (L) 3.5 - 5.0 g/dL   GFR calc non Af Amer 5 (L) >60 mL/min   GFR calc Af Amer 5 (L) >60 mL/min    Comment: (NOTE) The eGFR has been calculated using the CKD EPI equation. This calculation has not been validated in all clinical situations. eGFR's persistently <60 mL/min signify possible Chronic Kidney Disease.    Anion gap 15 5 - 15  Glucose, capillary     Status: Abnormal   Collection Time: 07/17/16  7:38 AM  Result Value Ref Range   Glucose-Capillary 109 (H) 65 - 99 mg/dL   Comment 1 Notify RN    Comment 2 Document in Chart   Potassium     Status: None   Collection Time: 07/17/16  8:55 AM  Result Value Ref Range   Potassium 4.6 3.5 - 5.1 mmol/L  CBC     Status: Abnormal   Collection Time: 07/17/16  8:55 AM  Result Value Ref Range   WBC 4.6 4.0 - 10.5 K/uL   RBC 3.64 (L) 3.87 - 5.11 MIL/uL   Hemoglobin 10.6 (L) 12.0 - 15.0 g/dL   HCT 33.5 (L) 36.0 - 46.0 %   MCV 92.0 78.0 - 100.0 fL   MCH 29.1 26.0 - 34.0 pg   MCHC 31.6 30.0 - 36.0 g/dL   RDW 15.9 (H) 11.5 - 15.5 %   Platelets 106 (L) 150 - 400 K/uL    Comment: SPECIMEN CHECKED FOR CLOTS PLATELET COUNT CONFIRMED BY SMEAR LARGE PLATELETS PRESENT   CBC     Status: Abnormal   Collection Time: 07/18/16  5:38 AM  Result Value Ref Range   WBC 5.1 4.0 - 10.5 K/uL   RBC 3.78 (L) 3.87 - 5.11 MIL/uL   Hemoglobin 11.0 (L) 12.0 - 15.0 g/dL   HCT 35.1 (L) 36.0 - 46.0 %   MCV 92.9 78.0 - 100.0 fL   MCH 29.1 26.0 -  34.0 pg   MCHC 31.3 30.0 - 36.0 g/dL   RDW 15.6 (H) 11.5 - 15.5 %   Platelets 106 (L) 150 - 400 K/uL    Comment: SPECIMEN CHECKED FOR CLOTS CONSISTENT WITH PREVIOUS RESULT   Renal function panel     Status: Abnormal   Collection Time: 07/18/16  5:41 AM  Result Value Ref Range   Sodium 133 (L) 135 - 145 mmol/L   Potassium 4.0 3.5 - 5.1 mmol/L   Chloride 96 (L) 101 - 111 mmol/L   CO2 26 22 - 32 mmol/L   Glucose, Bld 94 65 - 99 mg/dL   BUN 45 (H) 6 - 20 mg/dL   Creatinine, Ser 4.58 (H) 0.44 - 1.00 mg/dL    Comment: DELTA CHECK NOTED   Calcium 8.8 (L) 8.9 - 10.3 mg/dL   Phosphorus 6.1 (H) 2.5 - 4.6 mg/dL   Albumin 3.2 (L) 3.5 - 5.0 g/dL   GFR calc non Af Amer 8 (L) >60 mL/min   GFR calc Af Amer 9 (L) >60 mL/min    Comment: (NOTE) The eGFR has been calculated using the CKD EPI equation. This calculation has not been validated in all clinical situations. eGFR's persistently <60 mL/min signify possible Chronic Kidney Disease.    Anion gap 11 5 - 15  Glucose, capillary     Status: None   Collection Time: 07/18/16  7:55 AM  Result Value Ref Range   Glucose-Capillary 79 65 - 99 mg/dL    ABGS No results for input(s): PHART, PO2ART, TCO2, HCO3 in the last 72 hours.  Invalid input(s): PCO2 CULTURES Recent Results (from the past 240 hour(s))  MRSA PCR Screening     Status: None   Collection Time: 07/15/16  6:30 PM  Result Value Ref Range Status   MRSA by PCR NEGATIVE NEGATIVE Final    Comment:        The GeneXpert MRSA Assay (FDA approved for NASAL specimens only), is one component of a comprehensive MRSA colonization surveillance program. It is not intended to diagnose MRSA infection nor to guide  or monitor treatment for MRSA infections.    Studies/Results: No results found.  Medications:  Prior to Admission:  Prescriptions Prior to Admission  Medication Sig Dispense Refill Last Dose  . acetaminophen (TYLENOL) 325 MG tablet Take 650 mg by mouth every 6 (six) hours  as needed for mild pain.   Past Month at Unknown time  . ALPRAZolam (XANAX) 0.5 MG tablet Take 0.5 mg by mouth daily as needed for anxiety.    unknown  . Amino Acid Infusion (PROSOL) 20 % SOLN Inject 4 L into the vein 7 days.   unknown  . carvedilol (COREG) 3.125 MG tablet Take 3.125 mg by mouth 2 (two) times daily with a meal.    07/15/2016 at 0830  . clopidogrel (PLAVIX) 75 MG tablet Take 75 mg by mouth daily.    07/15/2016 at 0830  . diazepam (VALIUM) 5 MG tablet Take 5 mg by mouth daily as needed for anxiety.    unknown  . donepezil (ARICEPT) 10 MG tablet Take 10 mg by mouth at bedtime.   07/15/2016 at Unknown time  . Memantine HCl ER (NAMENDA XR) 28 MG CP24 Take 28 mg by mouth daily.   07/15/2016 at Unknown time  . multivitamin (RENA-VIT) TABS tablet Take 1 tablet by mouth daily.   07/15/2016 at Unknown time  . sevelamer carbonate (RENVELA) 2.4 G PACK Take 2.4 g by mouth 3 (three) times daily with meals.   07/15/2016 at Unknown time  . torsemide (DEMADEX) 20 MG tablet Take 20 mg by mouth daily.   07/14/2016 at Unknown time  . HYDROcodone-acetaminophen (NORCO) 5-325 MG tablet Take 1 tablet by mouth every 6 (six) hours as needed for moderate pain or severe pain. (Patient not taking: Reported on 07/15/2016) 50 tablet 0 Not Taking at Unknown time   Scheduled: . carvedilol  3.125 mg Oral BID WC  . clopidogrel  75 mg Oral Daily  . donepezil  10 mg Oral QHS  . heparin  5,000 Units Subcutaneous Q8H  . memantine  28 mg Oral Daily  . multivitamin  1 tablet Oral Daily  . sevelamer carbonate  2.4 g Oral TID WC  . sodium chloride flush  3 mL Intravenous Q12H  . sodium chloride flush  3 mL Intravenous Q12H  . torsemide  20 mg Oral Daily   Continuous: . albuterol 10 mg/hr (07/15/16 1800)   ZOX:WRUEAV chloride, sodium chloride, sodium chloride, sodium chloride, sodium chloride, acetaminophen **OR** acetaminophen, ALPRAZolam, alteplase, bisacodyl, heparin, heparin, HYDROcodone-acetaminophen, lidocaine (PF),  lidocaine-prilocaine, ondansetron **OR** ondansetron (ZOFRAN) IV, pentafluoroprop-tetrafluoroeth, polyethylene glycol, sodium chloride flush  Assesment: She was admitted with volume overload. She has been on dialysis and her volume status has been managed through dialysis. She is being transitioned from hemodialysis to peritoneal dialysis. She dialyzed yesterday but was not able to meet her ultrafiltration goal.  She has chronic systolic heart failure and that is the problem currently.  She has chronic end-stage kidney disease on dialysis.  She has dementia. Principal Problem:   Fluid overload Active Problems:   CKD (chronic kidney disease) stage V requiring chronic dialysis (HCC)   Volume overload   Thrombocytopenia (HCC)   Anemia of chronic disease   Dementia   CAD (coronary artery disease)   Chronic systolic CHF (congestive heart failure) (HCC)   Hyperkalemia   Elevated troponin   ESRD on hemodialysis [facility])    Plan: Discussed with nephrology attending. He will assess and see if she needs another dialysis today or if she can resume  outpatient dialysis. She could probably be discharged today after dialysis if she needs that.    LOS: 3 days   , L 07/18/2016, 8:35 AM

## 2016-07-18 NOTE — Progress Notes (Signed)
[name]  MRN: [id number]  DOB/AGE: [birth year] 80 y.o.  Primary Care Physician:HAWKINS,EDWARD L, MD  Admit date: 07/15/2016  Chief Complaint:  Chief Complaint  Patient presents with  . Shortness of Breath    S-Pt presented on  07/15/2016 with  Chief Complaint  Patient presents with  . Shortness of Breath  .    Pt offers no new complaints.Pt caregiver main concern was if pt is going home today"   Meds . carvedilol  3.125 mg Oral BID WC  . clopidogrel  75 mg Oral Daily  . donepezil  10 mg Oral QHS  . heparin  5,000 Units Subcutaneous Q8H  . memantine  28 mg Oral Daily  . multivitamin  1 tablet Oral Daily  . sevelamer carbonate  2.4 g Oral TID WC  . sodium chloride flush  3 mL Intravenous Q12H  . sodium chloride flush  3 mL Intravenous Q12H  . torsemide  20 mg Oral Daily     Physical Exam: Vital signs in last 24 hours: Temp:  [97.5 F (36.4 C)-98.7 F (37.1 C)] 98.7 F (37.1 C) (08/29 2214) Pulse Rate:  [57-71] 70 (08/29 2214) Resp:  [16-22] 18 (08/29 2214) BP: (88-145)/(40-74) 116/49 (08/29 2214) SpO2:  [99 %-100 %] 100 % (08/29 2214) Weight:  [119 lb 7.8 oz (54.2 kg)-119 lb 14.9 oz (54.4 kg)] 119 lb 14.9 oz (54.4 kg) (08/29 1130) Weight change: 7.1 oz (0.2 kg)     Physical Exam: General- pt is awake,follows commands Resp- No acute REsp distress, clear to auscultation. CVS- S1S2 regular in rate and rhythm GIT- BS+, soft, NT, ND, PD cath in situ EXT- NO LE Edema, Cyanosis Access- Femoral PC  Lab Results: CBC  Recent Labs  07/15/16 1624 07/17/16 0855  WBC 7.3 4.6  HGB 11.8* 10.6*  HCT 37.2 33.5*  PLT 130* 106*    BMET  Recent Labs  07/16/16 0449  07/17/16 0457 07/17/16 0855  NA 135  136  --  138  --   K 4.5  4.4  4.4  < > 4.6  4.5 4.6  CL 97*  98*  --  99*  --   CO2 24  24  --  24  --   GLUCOSE 97  98  --  96  --   BUN 70*  70*  --  96*  --   CREATININE 5.83*  5.82*  --  7.23*  --   CALCIUM 8.8*  8.9  --  8.0*  --   < > =  values in this interval not displayed.  MICRO Recent Results (from the past 240 hour(s))  MRSA PCR Screening     Status: None   Collection Time: 07/15/16  6:30 PM  Result Value Ref Range Status   MRSA by PCR NEGATIVE NEGATIVE Final    Comment:        The GeneXpert MRSA Assay (FDA approved for NASAL specimens only), is one component of a comprehensive MRSA colonization surveillance program. It is not intended to diagnose MRSA infection nor to guide or monitor treatment for MRSA infections.       Lab Results  Component Value Date   PTH 495.2 (H) 06/10/2013   CALCIUM 8.0 (L) 07/17/2016   CAION 1.21 12/17/2013   PHOS 8.5 (H) 07/17/2016  Alb 3.2 Corrected Calcium 8.0+ 0.6=8.6     Impression: 1)Renal  ESRD on HD  On Tues/Thurs/Saturday schedule  Was last dialyzed yesterday NO need of Hd today Will dialyze in  am   2)HTN  BP at goal   3)Anemia IN ESRD the goal for hgb is 9--11. Pt has anemia secondary to Chronic disease.   on EPO   4)CKD Mineral-Bone Disorder  PTH HIgh Secondary Hyperparathyroidism Present.  Phosphorus Not at goal.  On binders Calcium when corrected for low albumin is at goal  5)CNS- Dementia stable   6)Electrolytes Normokalemic. NOrmonatremic   7)Acid base  Co2 at goal   8) CHF admitted with fluid overload  now better     Plan:  Will dialyze in am. If d/ced pt has scheduled appt for HD as outpt      Marjorie Deprey S 07/18/2016, 5:36 AM

## 2016-07-19 NOTE — Discharge Summary (Signed)
Physician Discharge Summary  Patient ID: [name] MRN: [id number] DOB/AGE: 80/11/1930 80 y.o. Primary Care Physician:Espen Bethel L, MD Admit date: 07/15/2016 Discharge date: 07/19/2016    Discharge Diagnoses:   Principal Problem:   Fluid overload Active Problems:   CKD (chronic kidney disease) stage V requiring chronic dialysis (HCC)   Volume overload   Thrombocytopenia (HCC)   Anemia of chronic disease   Dementia   CAD (coronary artery disease)   Chronic systolic CHF (congestive heart failure) (HCC)   Hyperkalemia   Elevated troponin   ESRD on hemodialysis (HCC)     Medication List    TAKE these medications   acetaminophen 325 MG tablet Commonly known as:  TYLENOL Take 650 mg by mouth every 6 (six) hours as needed for mild pain.   ALPRAZolam 0.5 MG tablet Commonly known as:  XANAX Take 0.5 mg by mouth daily as needed for anxiety.   carvedilol 3.125 MG tablet Commonly known as:  COREG Take 3.125 mg by mouth 2 (two) times daily with a meal.   clopidogrel 75 MG tablet Commonly known as:  PLAVIX Take 75 mg by mouth daily.   diazepam 5 MG tablet Commonly known as:  VALIUM Take 5 mg by mouth daily as needed for anxiety.   donepezil 10 MG tablet Commonly known as:  ARICEPT Take 10 mg by mouth at bedtime.   HYDROcodone-acetaminophen 5-325 MG tablet Commonly known as:  NORCO Take 1 tablet by mouth every 6 (six) hours as needed for moderate pain or severe pain.   multivitamin Tabs tablet Take 1 tablet by mouth daily.   NAMENDA XR 28 MG Cp24 24 hr capsule Generic drug:  memantine Take 28 mg by mouth daily.   PROSOL 20 % Soln Inject 4 L into the vein 7 days.   sevelamer carbonate 2.4 g Pack Commonly known as:  RENVELA Take 2.4 g by mouth 3 (three) times daily with meals.   torsemide 20 MG tablet Commonly known as:  DEMADEX Take 20 mg by mouth daily.       Discharged Condition:Improved    Consults: Nephrology  Significant Diagnostic  Studies: [place] Abdomen Complete  Result Date: 07/06/2016 CLINICAL DATA:  Generalized abdominal swelling for 1 month. EXAM: ABDOMEN ULTRASOUND COMPLETE COMPARISON:  None. FINDINGS: Gallbladder: Surgically absent. Common bile duct: Diameter: 8 mm. Within normal limits for patient age, especially given prior cholecystectomy. Liver: No focal lesion identified. Within normal limits in parenchymal echogenicity. IVC: No abnormality visualized. Intravascular catheter device evident. Pancreas: Visualized portion unremarkable. Spleen: Size and appearance within normal limits. Right Kidney: Length: 6.8 cm. Marked cortical thinning with increased echogenicity. Left Kidney: Length: Could not be visualized. Abdominal aorta: No aneurysm visualized. Other findings: No evidence for ascites. IMPRESSION: 1. Status post cholecystectomy. 2. The marked cortical thinning with atrophy right kidney. Left kidney not visualized. Electronically Signed   By: Kennith CenterEric  Mansell M.D.   On: 07/06/2016 10:47   Dg Chest Portable 1 View  Result Date: 07/15/2016 CLINICAL DATA:  Short breath EXAM: PORTABLE CHEST 1 VIEW COMPARISON:  Radiograph 02/01/2015 FINDINGS: Large or RIGHT central venous line tip in the RIGHT atrium. Venous line from the inferior approach with tip in the RIGHT atrium also. Vascular stent in LEFT subclavian vein or artery. There is dense LEFT basilar atelectasis. There is patchy airspace disease in the RIGHT upper lobe and RIGHT lower lobe. IMPRESSION: 1. Patchy airspace disease the RIGHT upper lobe RIGHT lower lobe representing edema or multifocal pneumonia. 2. Dense LEFT basilar atelectasis  representing pneumonia, edema or atelectasis. Electronically Signed   By: Genevive Bi M.D.   On: 07/15/2016 15:53    Lab Results: Basic Metabolic Panel:  Recent Labs  18/84/16 0457 07/17/16 0855 07/18/16 0541  NA 138  --  133*  K 4.6  4.5 4.6 4.0  CL 99*  --  96*  CO2 24  --  26  GLUCOSE 96  --  94  BUN 96*  --  45*   CREATININE 7.23*  --  4.58*  CALCIUM 8.0*  --  8.8*  PHOS 8.5*  --  6.1*   Liver Function Tests:  Recent Labs  07/17/16 0457 07/18/16 0541  ALBUMIN 3.2* 3.2*     CBC:  Recent Labs  07/17/16 0855 07/18/16 0538  WBC 4.6 5.1  HGB 10.6* 11.0*  HCT 33.5* 35.1*  MCV 92.0 92.9  PLT 106* 106*    Recent Results (from the past 240 hour(s))  MRSA PCR Screening     Status: None   Collection Time: 07/15/16  6:30 PM  Result Value Ref Range Status   MRSA by PCR NEGATIVE NEGATIVE Final    Comment:        The GeneXpert MRSA Assay (FDA approved for NASAL specimens only), is one component of a comprehensive MRSA colonization surveillance program. It is not intended to diagnose MRSA infection nor to guide or monitor treatment for MRSA infections.      Hospital Course: This is an 82 year old who has end-stage renal disease and who is on hemodialysis but transitioning to peritoneal dialysis. She came to the emergency department because of shortness of breath and was volume overloaded. She received dialysis on the day of admission and was dialyzed after that as well. We did have some trouble reaching her ultrafiltration goal but her shortness of breath was totally resolved she was able to lie flat and had no complaints. She does have significant problems with dementia so assessment of her complaints is not accurate. At any rate by exam and by the fact that she could lie flat she seemed much improved and she had had fluid removed although it was not at goal. I discussed her situation with nephrology on the day of discharge and all felt that she could be discharged home safely to resume peritoneal/hemodialysis as an outpatient  Discharge Exam: Blood pressure 123/64, pulse 62, temperature 97.9 F (36.6 C), temperature source Oral, resp. rate 17, height 5\' 3"  (1.6 m), weight 54.9 kg (121 lb 0.5 oz), SpO2 100 %. She is awake and alert. She is confused. Her chest is clear. Her heart is  regular  Disposition: Home going to have home health services  Discharge Instructions    Face-to-face encounter (required for Medicare/Medicaid patients)    Complete by:  As directed   I Padraig Nhan L certify that this patient is under my care and that I, or a nurse practitioner or physician's assistant working with me, had a face-to-face encounter that meets the physician face-to-face encounter requirements with this patient on 07/18/2016. The encounter with the patient was in whole, or in part for the following medical condition(s) which is the primary reason for home health care (List medical condition): Volume overload/end-stage renal disease   The encounter with the patient was in whole, or in part, for the following medical condition, which is the primary reason for home health care:  Volume overload and end-stage renal disease   I certify that, based on my findings, the following services are medically necessary home  health services:   Nursing Physical therapy     Reason for Medically Necessary Home Health Services:  Skilled Nursing- Change/Decline in Patient Status   My clinical findings support the need for the above services:  Cognitive impairments, dementia, or mental confusion  that make it unsafe to leave home   Further, I certify that my clinical findings support that this patient is homebound due to:  Mental confusion   Home Health    Complete by:  As directed   To provide the following care/treatments:   PT RN        Follow-up Information    Nyle Limb L, MD Follow up on 07/18/2016.   Specialty:  Pulmonary Disease Why:  Hospital follow-up appointment on  Contact information: [address] [place] [address] [phone]        [facility] Follow up on 07/19/2016.   Why:  Appointment Thursday, July 19, 2016 at 12:00 noon Contact information: [address] [place] [address] [phone]           Signed: Dorse Locy  L   07/19/2016, 8:39 AM

## 2016-08-21 ENCOUNTER — Other Ambulatory Visit: Payer: Self-pay | Admitting: Vascular Surgery

## 2016-08-23 ENCOUNTER — Encounter
Admission: RE | Disposition: A | Discharge status: Home / Self Care | Payer: Self-pay | Source: Ambulatory Visit | Attending: Vascular Surgery

## 2016-08-23 ENCOUNTER — Ambulatory Visit
Admission: RE | Admit: 2016-08-23 | Discharge: 2016-08-23 | Disposition: A | Discharge status: Home / Self Care | Payer: Medicare HMO | Source: Ambulatory Visit | Attending: Vascular Surgery | Admitting: Vascular Surgery

## 2016-08-23 DIAGNOSIS — I10 Essential (primary) hypertension: Secondary | ICD-10-CM | POA: Diagnosis not present

## 2016-08-23 DIAGNOSIS — M1990 Unspecified osteoarthritis, unspecified site: Secondary | ICD-10-CM | POA: Insufficient documentation

## 2016-08-23 DIAGNOSIS — J449 Chronic obstructive pulmonary disease, unspecified: Secondary | ICD-10-CM | POA: Insufficient documentation

## 2016-08-23 DIAGNOSIS — I132 Hypertensive heart and chronic kidney disease with heart failure and with stage 5 chronic kidney disease, or end stage renal disease: Secondary | ICD-10-CM | POA: Diagnosis not present

## 2016-08-23 DIAGNOSIS — Z886 Allergy status to analgesic agent status: Secondary | ICD-10-CM | POA: Diagnosis not present

## 2016-08-23 DIAGNOSIS — F0390 Unspecified dementia without behavioral disturbance: Secondary | ICD-10-CM

## 2016-08-23 DIAGNOSIS — Z9981 Dependence on supplemental oxygen: Secondary | ICD-10-CM | POA: Diagnosis not present

## 2016-08-23 DIAGNOSIS — N186 End stage renal disease: Secondary | ICD-10-CM | POA: Diagnosis not present

## 2016-08-23 DIAGNOSIS — Z881 Allergy status to other antibiotic agents status: Secondary | ICD-10-CM | POA: Diagnosis not present

## 2016-08-23 DIAGNOSIS — Z882 Allergy status to sulfonamides status: Secondary | ICD-10-CM | POA: Insufficient documentation

## 2016-08-23 DIAGNOSIS — N2581 Secondary hyperparathyroidism of renal origin: Secondary | ICD-10-CM | POA: Diagnosis not present

## 2016-08-23 DIAGNOSIS — Z9049 Acquired absence of other specified parts of digestive tract: Secondary | ICD-10-CM | POA: Insufficient documentation

## 2016-08-23 DIAGNOSIS — I509 Heart failure, unspecified: Secondary | ICD-10-CM | POA: Insufficient documentation

## 2016-08-23 DIAGNOSIS — Z452 Encounter for adjustment and management of vascular access device: Secondary | ICD-10-CM | POA: Diagnosis present

## 2016-08-23 DIAGNOSIS — Z992 Dependence on renal dialysis: Secondary | ICD-10-CM | POA: Diagnosis not present

## 2016-08-23 HISTORY — PX: PERIPHERAL VASCULAR CATHETERIZATION: SHX172C

## 2016-08-23 SURGERY — DIALYSIS/PERMA CATHETER REMOVAL
Anesthesia: Moderate Sedation | Site: Thigh | Laterality: Left

## 2016-08-23 MED ORDER — LIDOCAINE-EPINEPHRINE (PF) 1 %-1:200000 IJ SOLN
INTRAMUSCULAR | Status: DC | PRN
  Administered 2016-08-23: 10 mL

## 2016-08-23 SURGICAL SUPPLY — 2 items
FORCEPS HALSTEAD CVD 5IN STRL (INSTRUMENTS) ×3 IMPLANT
TRAY LACERAT/PLASTIC (MISCELLANEOUS) ×3 IMPLANT

## 2016-08-23 NOTE — H&P (Signed)
[facility] Admission History & Physical  MRN : [id number]  Deanna StarchJean I [name] is a 82 y.o. (10/14/1931) female who presents with chief complaint of No chief complaint on file. [name].  History of Present Illness: Patient is sent from her dialysis access center for removal of her PermCath. She has profound dementia and is a poor historian. She is now doing peritoneal dialysis.  No current facility-administered medications for this encounter.     Past Medical History:  Diagnosis Date  . Anemia   . Arthritis   . Chronic systolic CHF (congestive heart failure) (HCC)    a. echo 06/14/14: EF 20-25%, diffuse HK, AK of mid-apicalinferolateral myocardium, DD, PASP 44 mm Hg; b. echo 11/2014 EF 30-35%, diastolic dysfunction, global HK, unable to exclude RWMA, mod MR; c. 01/2015 Echo: EF 45-50%, mild diff HK, gr1 DD, triv AI, mild MR, mildly dil LA, nl RV/PASP.  [name]. Complication of anesthesia   . COPD (chronic obstructive pulmonary disease) (HCC)    a. on home O2  . Dementia   . ESRD (end stage renal disease) (HCC)    a. HD MWF; b. multiple fistula revisions now using permacath.  . History of blood transfusion    a. after fistula ruptured  . History of pneumonia 05/2013  . PONV (postoperative nausea and vomiting)   . Presumed CAD    a. presumed CAD - patient's son has declined cath/ischemic eval to date  . Secondary hyperparathyroidism (HCC)   . Shortness of breath dyspnea    doe    Past Surgical History:  Procedure Laterality Date  . AV FISTULA PLACEMENT Right 07/07/2013   Procedure: ARTERIOVENOUS (AV) FISTULA CREATION - RIGHT BRACHIAL CEPHALIC ;  Surgeon: Fransisco HertzBrian L Chen, MD;  Location: [facility] OR;  Service: Vascular;  Laterality: Right;  . CAPD INSERTION N/A 05/25/2016   Procedure: LAPAROSCOPIC INSERTION CONTINUOUS AMBULATORY PERITONEAL DIALYSIS  (CAPD) CATHETER;  Surgeon: Renford DillsGregory G Schnier, MD;  Location: [facility] ORS;  Service: Vascular;  Laterality: N/A;  . CHOLECYSTECTOMY  1992  .  DIALYSIS FISTULA CREATION    . EXCHANGE OF A DIALYSIS CATHETER Left 12/17/2013   Procedure: EXCHANGE OF A DIALYSIS CATHETER;  Surgeon: Chuck Hinthristopher S Dickson, MD;  [facility];  Service: Vascular;  Laterality: Left;  . FISTULOGRAM Right 12/17/2013   Procedure: FISTULOGRAM- RIGHT ARM- POSSIBLE INTERVENTION;  Surgeon: Chuck Hinthristopher S Dickson, MD;  [facility];  Service: Vascular;  Laterality: Right;  . HEMATOMA EVACUATION Right 05/25/2016   Procedure: EVACUATION HEMATOMA ( DRAINAGE );  Surgeon: Renford DillsGregory G Schnier, MD;  Location: [facility] ORS;  Service: Vascular;  Laterality: Right;  . INSERTION OF DIALYSIS CATHETER Right 06/04/2013   Procedure: INSERTION OF DIALYSIS CATHETER;  Surgeon: Fransisco HertzBrian L Chen, MD;  [facility];  Service: Vascular;  Laterality: Right;  . INSERTION OF DIALYSIS CATHETER Left 11/09/2013   Procedure: INSERTION OF DIALYSIS CATHETER- Left TDC;  Surgeon: Fransisco HertzBrian L Chen, MD;  [facility];  Service: Vascular;  Laterality: Left;  . LIGATION OF ARTERIOVENOUS  FISTULA Left 06/04/2013   Procedure: LIGATION OF ARTERIOVENOUS  FISTULA;  Surgeon: Fransisco HertzBrian L Chen, MD;  [facility];  Service: Vascular;  Laterality: Left;  . LIGATION OF ARTERIOVENOUS  FISTULA Right 01/19/2014   Procedure: LIGATION OF ARTERIOVENOUS  FISTULA- RIGHT BRACHIOCEPHALIC FISTULA;  Surgeon: Fransisco HertzBrian L Chen, MD;  [facility];  Service: Vascular;  Laterality: Right;  . LIGATION OF COMPETING BRANCHES OF ARTERIOVENOUS FISTULA Right 12/17/2013   Procedure: LIGATION OF COMPETING BRANCHES OF  ARTERIOVENOUS FISTULA;  Surgeon: Chuck Hint, MD;  Location: [facility] OR;  Service: Vascular;  Laterality: Right;  . PERIPHERAL VASCULAR CATHETERIZATION N/A 06/28/2015   Procedure: Dialysis/Perma Catheter Insertion;  Surgeon: Renford Dills, MD;  Location: [facility] INVASIVE CV LAB;  Service: Cardiovascular;  Laterality: N/A;  . PERIPHERAL VASCULAR CATHETERIZATION N/A 09/28/2015   Procedure: Dialysis/Perma Catheter Insertion;  Surgeon: Renford Dills, MD;  Location: [facility] INVASIVE CV LAB;  Service: Cardiovascular;  Laterality: N/A;  . PERIPHERAL VASCULAR CATHETERIZATION N/A 01/03/2016   Procedure: Dialysis/Perma Catheter Insertion;  Surgeon: Renford Dills, MD;  Location: [facility] INVASIVE CV LAB;  Service: Cardiovascular;  Laterality: N/A;  . PERIPHERAL VASCULAR CATHETERIZATION N/A 01/26/2016   Procedure: Dialysis/Perma Catheter Removal;  Surgeon: Annice Needy, MD;  Location: [facility] INVASIVE CV LAB;  Service: Cardiovascular;  Laterality: N/A;  . REMOVAL OF A DIALYSIS CATHETER Right 11/09/2013   Procedure: REMOVAL OF A DIALYSIS CATHETER- Right TDC ;  Surgeon: Fransisco Hertz, MD;  Location[facility];  Service: Vascular;  Laterality: Right;  . SHUNTOGRAM Right 09/10/2013   Procedure: Fistulogram;  Surgeon: Fransisco Hertz, MD;  Location: [facility] CATH LAB;  Service: Cardiovascular;  Laterality: Right;    Social History Social History  Substance Use Topics  . Smoking status: Never Smoker  . Smokeless tobacco: Never Used  . Alcohol use No    Family History No reported history of bleeding disorders, clotting disorders, or autoimmune diseases.  Allergies  Allergen Reactions  . Sulfa Antibiotics Nausea And Vomiting and Rash  . Sulfur Nausea And Vomiting and Rash  . Aspirin Nausea And Vomiting     REVIEW OF SYSTEMS (Negative unless checked)  Constitutional: [] Weight loss  [] Fever  [] Chills Cardiac: [] Chest pain   [] Chest pressure   [x] Palpitations   [] Shortness of breath when laying flat   [] Shortness of breath at rest   [] Shortness of breath with exertion. Vascular:  [] Pain in legs with walking   [] Pain in legs at rest   [] Pain in legs when laying flat   [] Claudication   [] Pain in feet when walking  [] Pain in feet at rest  [] Pain in feet when laying flat   [] History of DVT   [] Phlebitis   [] Swelling in legs   [] Varicose veins   [] Non-healing ulcers Pulmonary:   [] Uses home oxygen   [] Productive cough   [] Hemoptysis   [] Wheeze  [x] COPD    [] Asthma Neurologic:  [] Dizziness  [] Blackouts   [] Seizures   [] History of stroke   [] History of TIA  [] Aphasia   [] Temporary blindness   [] Dysphagia   [] Weakness or numbness in arms   [] Weakness or numbness in legs Musculoskeletal:  [] Arthritis   [] Joint swelling   [] Joint pain   [] Low back pain Hematologic:  [] Easy bruising  [] Easy bleeding   [] Hypercoagulable state   [] Anemic  [] Hepatitis Gastrointestinal:  [] Blood in stool   [] Vomiting blood  [] Gastroesophageal reflux/heartburn   [] Difficulty swallowing. Genitourinary:  [x] Chronic kidney disease   [] Difficult urination  [] Frequent urination  [] Burning with urination   [] Blood in urine Skin:  [] Rashes   [] Ulcers   [] Wounds Psychological:  [] History of anxiety   []  History of major depression.  Physical Examination  Vitals:   08/23/16 0906  BP: (!) 124/55  Pulse: 68  Resp: 17  Temp: 97.8 F (36.6 C)  TempSrc: Oral  SpO2: 96%  Weight: 49.9 kg (110 lb)  Height: 5\' 3"  (1.6 m)   Body mass index is 19.49 kg/m. Gen: WD/WN, NAD  Head: [place]/AT, No temporalis wasting. Prominent temp pulse not noted. Ear/Nose/Throat: Hearing grossly intact, nares w/o erythema or drainage, oropharynx w/o Erythema/Exudate,  Eyes: PERRLA, EOMI.  Neck: Supple, no nuchal rigidity.  No JVD.  Pulmonary:  Good air movement, no use of accessory muscles.  Cardiac: RRR, normal S1, S2 Vascular: Femoral PermCath in place as well as peritoneal dialysis catheter. No signs of infection from either site. Vessel Right Left  Radial Palpable Palpable                                   Gastrointestinal: soft, non-tender/non-distended. No guarding/reflex.  Musculoskeletal: M/S 5/5 throughout.  Extremities without ischemic changes.  No deformity or atrophy.  Neurologic: CN 2-12 intact. Pain and light touch intact in extremities.  Symmetrical.  Speech is fluent. Motor exam as listed above. Psychiatric: Profound dementia. Poor historian Dermatologic: No rashes or ulcers  noted.  No cellulitis or open wounds. Lymph : No Cervical, Axillary, or Inguinal lymphadenopathy.    CBC Lab Results  Component Value Date   WBC 5.1 07/18/2016   HGB 11.0 (L) 07/18/2016   HCT 35.1 (L) 07/18/2016   MCV 92.9 07/18/2016   PLT 106 (L) 07/18/2016    BMET    Component Value Date/Time   NA 133 (L) 07/18/2016 0541   NA 135 (L) 12/17/2014 0509   K 4.0 07/18/2016 0541   K 4.3 12/17/2014 0509   CL 96 (L) 07/18/2016 0541   CL 98 12/17/2014 0509   CO2 26 07/18/2016 0541   CO2 29 12/17/2014 0509   GLUCOSE 94 07/18/2016 0541   GLUCOSE 87 12/17/2014 0509   BUN 45 (H) 07/18/2016 0541   BUN 34 (H) 12/17/2014 0509   CREATININE 4.58 (H) 07/18/2016 0541   CREATININE 6.62 (H) 12/17/2014 0509   CALCIUM 8.8 (L) 07/18/2016 0541   CALCIUM 7.7 (L) 12/17/2014 0509   CALCIUM 7.7 (L) 06/10/2013 0609   GFRNONAA 8 (L) 07/18/2016 0541   GFRNONAA 6 (L) 12/17/2014 0509   GFRNONAA 5 (L) 06/16/2014 0406   GFRAA 9 (L) 07/18/2016 0541   GFRAA 8 (L) 12/17/2014 0509   GFRAA 6 (L) 06/16/2014 0406   CrCl cannot be calculated (Patient's most recent lab result is older than the maximum 21 days allowed.).  COAG Lab Results  Component Value Date   INR 1.08 05/17/2016   INR 1.2 12/13/2014   INR 1.11 06/04/2013    Radiology No results found.    Assessment/Plan 1. End-stage renal disease with functional peritoneal dialysis catheter. To remove her PermCath today. Risks and benefits discussed 2. Dementia. Fairly profound. Very poor historian. 3. Hypertension. Stable outpatient medications.   Festus Barren, MD  08/23/2016 9:40 AM

## 2016-08-23 NOTE — Op Note (Signed)
Operative Note     Preoperative diagnosis:   1. ESRD with functional permanent access  Postoperative diagnosis:  1. ESRD with functional permanent access  Procedure:  Removal of left femoral Permcath  Surgeon:  Festus BarrenJason Verlena Marlette, MD  Anesthesia:  Local  EBL:  Minimal  Indication for the Procedure:  The patient has a functional permanent dialysis access and no longer needs their permcath.  This can be removed.  Risks and benefits are discussed and informed consent is obtained.  Description of the Procedure:  The patient's left groin, thigh, and existing catheter were sterilely prepped and draped. The area around the catheter was anesthetized copiously with 1% lidocaine. The catheter was dissected out with curved hemostats until the cuff was freed from the surrounding fibrous sheath. The fiber sheath was transected, and the catheter was then removed in its entirety using gentle traction. Pressure was held and sterile dressings were placed. The patient tolerated the procedure well and was taken to the recovery room in stable condition.     Festus BarrenJason Kynsli Haapala  08/23/2016, 10:05 AM This note was created with Dragon Medical transcription system. Any errors in dictation are purely unintentional.

## 2016-08-24 ENCOUNTER — Encounter: Payer: Self-pay | Admitting: Vascular Surgery

## 2016-10-17 ENCOUNTER — Encounter: Payer: Self-pay | Admitting: Cardiovascular Disease

## 2016-10-17 ENCOUNTER — Ambulatory Visit (INDEPENDENT_AMBULATORY_CARE_PROVIDER_SITE_OTHER): Payer: Medicare HMO | Admitting: Cardiovascular Disease

## 2016-10-17 VITALS — BP 168/111 | HR 77 | Ht 63.0 in | Wt 115.0 lb

## 2016-10-17 DIAGNOSIS — Z992 Dependence on renal dialysis: Secondary | ICD-10-CM

## 2016-10-17 DIAGNOSIS — I5022 Chronic systolic (congestive) heart failure: Secondary | ICD-10-CM | POA: Diagnosis not present

## 2016-10-17 DIAGNOSIS — F0391 Unspecified dementia with behavioral disturbance: Secondary | ICD-10-CM | POA: Diagnosis not present

## 2016-10-17 DIAGNOSIS — N186 End stage renal disease: Secondary | ICD-10-CM

## 2016-10-17 NOTE — Progress Notes (Signed)
Cardiology Office Note  Date:  10/17/2016   ID:  [name], DOB 03/02/1931, MRN [id number]  PCP:  Fredirick MaudlinHAWKINS,EDWARD L, MD   Chief Complaint  Patient presents with  . other    6 month f/u no complaints. Meds reviewed verbally with pt.    HPI:  Ms.  [name] is a 82 year old female with history of chronic systolic CHF, ESRD on HD (M,W,F), dementia, and secondary hyperparathyroidism, [facility] from 1/25-1/29 for E coli HeRO sepsis 2/2 graft infection s/p graft removal now with permanent graft placed on 1/29, chronic systolic CHF (well compensated during admission), and demand ischemia. She presents for follow-up of her systolic CHF. 20-25%, up to 45 to 50% in 01/20/2015  In follow-up today, previous hospital records reviewed, long discussion with patient's son who presents with her today [facility], 07/18/2016 received HD on 07/09/2016 without incident. She then underwent PD training sessions with 4 hours of installation each day on 07/11/2016, 07/12/2016, and 07/13/2016. She developed worsening dyspnea, was symptomatic at rest Could not make it through weekend without dialysis  Currently he is doing PD everyday and has had no significant difficulty Previously Was having dificulty with HD, was having pass out spells, BP too low She was wiped out, miserable Blood pressure has stabilized on peritoneal dialysis She does treatment 10 hours every night Cycles 5 times every night, Very Dextrose solution Dry weight 115 pounds,  Lipitor was held for myalgias  Without medication, muscle ache improved No recent lipid panel available for review  Family reports Drops in oxygen with ambulation,  now on oxygen  She walks with a walker around the house, otherwise travels with a wheelchair. Denies any significant shortness of breath or leg edema.  Blood pressure elevated today though difficulty obtaining accurate blood pressure in the leg. Unable  to obtain pressures in the arms  EKG on today's visit shows sinus rhythm with rate 77 bpm, nonspecific ST and T wave abnormality 1 and aVL, unable to exclude old anterior MI  Other past medical history admitted [facility] back in July 2015 for UTI and weakness.  Echo was checked during that admission back in July 2015 which showed and EF 20-25%, diffuse HK, AK of mid-apicalinferolateral myocardium, GRDD, PASP 44 mm Hg.   Her dialysis access was found to be blocked in 07/2014. port unblocked by [facility] on 08/05/14.   present[facility] for HD and was found to be febrile with a T max of 101.8 and tachycardic at 139 bpm. Her dialysis access was found to be infected.  temporary access was placed in the right femoral vein. She was scheduled to go to the OR for removal of her current dialysis access. Labs were significant for troponin of 0.12-->0.39-->0.41 and WBC count 5.1-->13.5. EKG showed sinus tachycardia, 123 bpm, left axis deviation, lateral TWI.   Echo on 12/14/2014 that showed EF 30-35%, diastolic dysfunction, global HK, unable to exclude RWMA, moderate MR, normal RVSP.   She underwent successful removal of infected graft and placement of new graft during her admission. Family declined ischemia workup      PMH:   has a past medical history of Anemia; Arthritis; Chronic systolic CHF (congestive heart failure) (HCC); Complication of anesthesia; COPD (chronic obstructive pulmonary disease) (HCC); Dementia; ESRD (end stage renal disease) (HCC); History of blood transfusion; History of pneumonia (05/2013); PONV (postoperative nausea and vomiting); Presumed CAD; Secondary hyperparathyroidism (HCC);  and Shortness of breath dyspnea.  PSH:    Past Surgical History:  Procedure Laterality Date  . AV FISTULA PLACEMENT Right 07/07/2013   Procedure: ARTERIOVENOUS (AV) FISTULA CREATION - RIGHT BRACHIAL CEPHALIC ;  Surgeon: Fransisco Hertz, MD;  Location: [facility] OR;   Service: Vascular;  Laterality: Right;  . CAPD INSERTION N/A 05/25/2016   Procedure: LAPAROSCOPIC INSERTION CONTINUOUS AMBULATORY PERITONEAL DIALYSIS  (CAPD) CATHETER;  Surgeon: Renford Dills, MD;  Location: [facility] ORS;  Service: Vascular;  Laterality: N/A;  . CHOLECYSTECTOMY  1992  . DIALYSIS FISTULA CREATION    . EXCHANGE OF A DIALYSIS CATHETER Left 12/17/2013   Procedure: EXCHANGE OF A DIALYSIS CATHETER;  Surgeon: Chuck Hint, MD;  Location: [facility] OR;  Service: Vascular;  Laterality: Left;  . FISTULOGRAM Right 12/17/2013   Procedure: FISTULOGRAM- RIGHT ARM- POSSIBLE INTERVENTION;  Surgeon: Chuck Hint, MD;  Location: [facility] OR;  Service: Vascular;  Laterality: Right;  . HEMATOMA EVACUATION Right 05/25/2016   Procedure: EVACUATION HEMATOMA ( DRAINAGE );  Surgeon: Renford Dills, MD;  Location: [facility] ORS;  Service: Vascular;  Laterality: Right;  . INSERTION OF DIALYSIS CATHETER Right 06/04/2013   Procedure: INSERTION OF DIALYSIS CATHETER;  Surgeon: Fransisco Hertz, MD;  Location: [facility] OR;  Service: Vascular;  Laterality: Right;  . INSERTION OF DIALYSIS CATHETER Left 11/09/2013   Procedure: INSERTION OF DIALYSIS CATHETER- Left TDC;  Surgeon: Fransisco Hertz, MD;  Location: [facility] OR;  Service: Vascular;  Laterality: Left;  . LIGATION OF ARTERIOVENOUS  FISTULA Left 06/04/2013   Procedure: LIGATION OF ARTERIOVENOUS  FISTULA;  Surgeon: Fransisco Hertz, MD;  Location: [facility] OR;  Service: Vascular;  Laterality: Left;  . LIGATION OF ARTERIOVENOUS  FISTULA Right 01/19/2014   Procedure: LIGATION OF ARTERIOVENOUS  FISTULA- RIGHT BRACHIOCEPHALIC FISTULA;  Surgeon: Fransisco Hertz, MD;  Location: [facility] OR;  Service: Vascular;  Laterality: Right;  . LIGATION OF COMPETING BRANCHES OF ARTERIOVENOUS FISTULA Right 12/17/2013   Procedure: LIGATION OF COMPETING BRANCHES OF ARTERIOVENOUS FISTULA;  Surgeon: Chuck Hint, MD;  Location: [facility] OR;  Service: Vascular;  Laterality: Right;  . PERIPHERAL VASCULAR CATHETERIZATION N/A  06/28/2015   Procedure: Dialysis/Perma Catheter Insertion;  Surgeon: Renford Dills, MD;  Location: [facility] INVASIVE CV LAB;  Service: Cardiovascular;  Laterality: N/A;  . PERIPHERAL VASCULAR CATHETERIZATION N/A 09/28/2015   Procedure: Dialysis/Perma Catheter Insertion;  Surgeon: Renford Dills, MD;  Location: [facility] INVASIVE CV LAB;  Service: Cardiovascular;  Laterality: N/A;  . PERIPHERAL VASCULAR CATHETERIZATION N/A 01/03/2016   Procedure: Dialysis/Perma Catheter Insertion;  Surgeon: Renford Dills, MD;  Location: [facility] INVASIVE CV LAB;  Service: Cardiovascular;  Laterality: N/A;  . PERIPHERAL VASCULAR CATHETERIZATION N/A 01/26/2016   Procedure: Dialysis/Perma Catheter Removal;  Surgeon: Annice Needy, MD;  Location: [facility] INVASIVE CV LAB;  Service: Cardiovascular;  Laterality: N/A;  . PERIPHERAL VASCULAR CATHETERIZATION Left 08/23/2016   Procedure: Dialysis/Perma Catheter Removal;  Surgeon: Annice Needy, MD;  Location: [facility] INVASIVE CV LAB;  Service: Cardiovascular;  Laterality: Left;  . REMOVAL OF A DIALYSIS CATHETER Right 11/09/2013   Procedure: REMOVAL OF A DIALYSIS CATHETER- Right TDC ;  Surgeon: Fransisco Hertz, MD;  Location: [facility] OR;  Service: Vascular;  Laterality: Right;  . SHUNTOGRAM Right 09/10/2013   Procedure: Fistulogram;  Surgeon: Fransisco Hertz, MD;  Location: [facility] CATH LAB;  Service: Cardiovascular;  Laterality: Right;    Current Outpatient Prescriptions  Medication Sig Dispense Refill  . acetaminophen (TYLENOL) 325 MG tablet Take 650 mg  by mouth every 6 (six) hours as needed for mild pain.    [name]. ALPRAZolam (XANAX) 0.5 MG tablet Take 0.5 mg by mouth daily as needed for anxiety.     . Amino Acid Infusion (PROSOL) 20 % SOLN Inject 4 L into the vein 7 days.    . carvedilol (COREG) 3.125 MG tablet Take 3.125 mg by mouth 2 (two) times daily with a meal.     . clopidogrel (PLAVIX) 75 MG tablet Take 75 mg by mouth daily.     . diazepam (VALIUM) 5 MG tablet Take 5 mg by mouth daily as needed for  anxiety.     . donepezil (ARICEPT) 10 MG tablet Take 10 mg by mouth at bedtime.    . Memantine HCl ER (NAMENDA XR) 28 MG CP24 Take 28 mg by mouth daily.    . multivitamin (RENA-VIT) TABS tablet Take 1 tablet by mouth daily.    . sevelamer carbonate (RENVELA) 2.4 G PACK Take 2.4 g by mouth 3 (three) times daily with meals.    . torsemide (DEMADEX) 20 MG tablet Take 20 mg by mouth daily.     No current facility-administered medications for this visit.      Allergies:   Sulfa antibiotics; Sulfur; Aspirin; and Lipitor [atorvastatin]   Social History:  The patient  reports that she has never smoked. She has never used smokeless tobacco. She reports that she does not drink alcohol or use drugs.   Family History:   Family history is unknown by patient.    Review of Systems: Review of Systems  Constitutional: Negative.   Respiratory: Negative.   Cardiovascular: Negative.   Gastrointestinal: Negative.   Musculoskeletal: Negative.   Neurological: Negative.   Psychiatric/Behavioral: Negative.   All other systems reviewed and are negative.    PHYSICAL EXAM: VS:  BP (!) 168/111 (BP Location: Left Leg, Patient Position: Sitting, Cuff Size: Large)   Pulse 77   Ht 5\' 3"  (1.6 m)   Wt 115 lb (52.2 kg)   BMI 20.37 kg/m  , BMI Body mass index is 20.37 kg/m. GEN: Well nourished, well developed, in no acute distress, presenting a wheelchair  HEENT: normal  Neck: no JVD, carotid bruits, or masses Cardiac: RRR; 1+ SEM RSB,  no rubs, or gallops,no edema  Respiratory:  clear to auscultation bilaterally, normal work of breathing GI: soft, nontender, nondistended, + BS MS: no deformity or atrophy  Skin: warm and dry, no rash Neuro:  Strength and sensation are intact Psych: Relatively nonverbal during the visit though responds to questions, confusion    Recent Labs: 07/15/2016: B Natriuretic Peptide 2,033.0 07/18/2016: BUN 45; Creatinine, Ser 4.58; Hemoglobin 11.0; Platelets 106; Potassium  4.0; Sodium 133    Lipid Panel No results found for: CHOL, HDL, LDLCALC, TRIG    Wt Readings from Last 3 Encounters:  10/17/16 115 lb (52.2 kg)  08/23/16 110 lb (49.9 kg)  07/18/16 121 lb 0.5 oz (54.9 kg)       ASSESSMENT AND PLAN:  Chronic systolic CHF (congestive heart failure) (HCC) - Plan: EKG 12-Lead Improvement in her ejection fraction since 2015 when she was having urinary tract infection. Most recent ejection fraction 45-50% Also on torsemide daily, carvedilol Difficult to accurately obtain blood pressure measurements Fluid balance with peritoneal dialysis, weight is stable  ESRD (end stage renal disease) on dialysis [facility](HCC) Long discussion concerning her peritoneal dialysis Tolerating this well, less labile blood pressure Not troubled by hypotension  Dementia with behavioral disturbance, unspecified dementia  type Son reports behavior is stable  CKD (chronic kidney disease) stage V requiring chronic dialysis (HCC) End-stage renal disease on hemodialysis, tolerating peritoneal dialysis  Hypertension Recommended that family try to obtain blood pressure measurements when she is supine in bed, done using leg, unable to obtain pressures in her arms given bilateral grafts, previous surgery   Total encounter time more than 25 minutes  Greater than 50% was spent in counseling and coordination of care with the patient   Disposition:   F/U  12 months   Orders Placed This Encounter  Procedures  . EKG 12-Lead     Signed, Dossie Arbour, M.D., Ph.D. 10/17/2016  [facility] [place], [place] [phone]

## 2016-10-17 NOTE — Patient Instructions (Addendum)

## 2016-12-04 ENCOUNTER — Emergency Department (HOSPITAL_COMMUNITY): Payer: Medicare HMO

## 2016-12-04 ENCOUNTER — Inpatient Hospital Stay (HOSPITAL_COMMUNITY)
Admission: EM | Admit: 2016-12-04 | Discharge: 2016-12-20 | DRG: 871 | Disposition: E | Payer: Medicare HMO | Attending: Family Medicine | Admitting: Family Medicine

## 2016-12-04 ENCOUNTER — Encounter (HOSPITAL_COMMUNITY): Payer: Self-pay | Admitting: Emergency Medicine

## 2016-12-04 DIAGNOSIS — Z882 Allergy status to sulfonamides status: Secondary | ICD-10-CM

## 2016-12-04 DIAGNOSIS — I469 Cardiac arrest, cause unspecified: Secondary | ICD-10-CM | POA: Diagnosis not present

## 2016-12-04 DIAGNOSIS — D649 Anemia, unspecified: Secondary | ICD-10-CM | POA: Diagnosis present

## 2016-12-04 DIAGNOSIS — J440 Chronic obstructive pulmonary disease with acute lower respiratory infection: Secondary | ICD-10-CM | POA: Diagnosis present

## 2016-12-04 DIAGNOSIS — I132 Hypertensive heart and chronic kidney disease with heart failure and with stage 5 chronic kidney disease, or end stage renal disease: Secondary | ICD-10-CM | POA: Diagnosis present

## 2016-12-04 DIAGNOSIS — R4182 Altered mental status, unspecified: Secondary | ICD-10-CM | POA: Diagnosis not present

## 2016-12-04 DIAGNOSIS — R6521 Severe sepsis with septic shock: Secondary | ICD-10-CM | POA: Diagnosis present

## 2016-12-04 DIAGNOSIS — Z886 Allergy status to analgesic agent status: Secondary | ICD-10-CM

## 2016-12-04 DIAGNOSIS — M1990 Unspecified osteoarthritis, unspecified site: Secondary | ICD-10-CM | POA: Diagnosis present

## 2016-12-04 DIAGNOSIS — I2510 Atherosclerotic heart disease of native coronary artery without angina pectoris: Secondary | ICD-10-CM | POA: Diagnosis present

## 2016-12-04 DIAGNOSIS — N186 End stage renal disease: Secondary | ICD-10-CM

## 2016-12-04 DIAGNOSIS — Z888 Allergy status to other drugs, medicaments and biological substances status: Secondary | ICD-10-CM

## 2016-12-04 DIAGNOSIS — Z66 Do not resuscitate: Secondary | ICD-10-CM | POA: Diagnosis not present

## 2016-12-04 DIAGNOSIS — Z7902 Long term (current) use of antithrombotics/antiplatelets: Secondary | ICD-10-CM

## 2016-12-04 DIAGNOSIS — Z992 Dependence on renal dialysis: Secondary | ICD-10-CM

## 2016-12-04 DIAGNOSIS — Z9981 Dependence on supplemental oxygen: Secondary | ICD-10-CM

## 2016-12-04 DIAGNOSIS — J9690 Respiratory failure, unspecified, unspecified whether with hypoxia or hypercapnia: Secondary | ICD-10-CM | POA: Diagnosis present

## 2016-12-04 DIAGNOSIS — A419 Sepsis, unspecified organism: Secondary | ICD-10-CM | POA: Diagnosis not present

## 2016-12-04 DIAGNOSIS — Z515 Encounter for palliative care: Secondary | ICD-10-CM | POA: Diagnosis not present

## 2016-12-04 DIAGNOSIS — J9601 Acute respiratory failure with hypoxia: Secondary | ICD-10-CM | POA: Diagnosis present

## 2016-12-04 DIAGNOSIS — J189 Pneumonia, unspecified organism: Secondary | ICD-10-CM | POA: Diagnosis present

## 2016-12-04 DIAGNOSIS — F0390 Unspecified dementia without behavioral disturbance: Secondary | ICD-10-CM | POA: Diagnosis present

## 2016-12-04 DIAGNOSIS — N2581 Secondary hyperparathyroidism of renal origin: Secondary | ICD-10-CM | POA: Diagnosis present

## 2016-12-04 DIAGNOSIS — I5022 Chronic systolic (congestive) heart failure: Secondary | ICD-10-CM | POA: Diagnosis present

## 2016-12-04 LAB — COMPREHENSIVE METABOLIC PANEL
ALBUMIN: 1.8 g/dL — AB (ref 3.5–5.0)
ALT: 14 U/L (ref 14–54)
ANION GAP: 17 — AB (ref 5–15)
AST: 19 U/L (ref 15–41)
Alkaline Phosphatase: 109 U/L (ref 38–126)
BILIRUBIN TOTAL: 0.3 mg/dL (ref 0.3–1.2)
BUN: 49 mg/dL — AB (ref 6–20)
CHLORIDE: 96 mmol/L — AB (ref 101–111)
CO2: 24 mmol/L (ref 22–32)
Calcium: 7.7 mg/dL — ABNORMAL LOW (ref 8.9–10.3)
Creatinine, Ser: 7.06 mg/dL — ABNORMAL HIGH (ref 0.44–1.00)
GFR calc Af Amer: 5 mL/min — ABNORMAL LOW (ref >60)
GFR calc non Af Amer: 5 mL/min — ABNORMAL LOW (ref >60)
Glucose, Bld: 92 mg/dL (ref 65–99)
POTASSIUM: 4.9 mmol/L (ref 3.5–5.1)
Sodium: 137 mmol/L (ref 135–145)
Total Protein: 5.5 g/dL — ABNORMAL LOW (ref 6.5–8.1)

## 2016-12-04 LAB — CBC WITH DIFFERENTIAL/PLATELET
Basophils Absolute: 0 10*3/uL (ref 0.0–0.1)
Basophils Relative: 0 %
EOS PCT: 1 %
Eosinophils Absolute: 0.2 10*3/uL (ref 0.0–0.7)
HCT: 28.6 % — ABNORMAL LOW (ref 36.0–46.0)
Hemoglobin: 9.1 g/dL — ABNORMAL LOW (ref 12.0–15.0)
LYMPHS PCT: 3 %
Lymphs Abs: 0.6 10*3/uL — ABNORMAL LOW (ref 0.7–4.0)
MCH: 31.7 pg (ref 26.0–34.0)
MCHC: 31.8 g/dL (ref 30.0–36.0)
MCV: 99.7 fL (ref 78.0–100.0)
MONO ABS: 0.7 10*3/uL (ref 0.1–1.0)
MONOS PCT: 4 %
NEUTROS ABS: 16.9 10*3/uL — AB (ref 1.7–7.7)
Neutrophils Relative %: 92 %
PLATELETS: ADEQUATE 10*3/uL (ref 150–400)
RBC: 2.87 MIL/uL — ABNORMAL LOW (ref 3.87–5.11)
RDW: 16.6 % — ABNORMAL HIGH (ref 11.5–15.5)
SMEAR REVIEW: ADEQUATE
WBC: 18.4 10*3/uL — ABNORMAL HIGH (ref 4.0–10.5)

## 2016-12-04 LAB — TSH: TSH: 3.273 u[IU]/mL (ref 0.350–4.500)

## 2016-12-04 LAB — I-STAT TROPONIN, ED: Troponin i, poc: 0 ng/mL (ref 0.00–0.08)

## 2016-12-04 LAB — I-STAT CG4 LACTIC ACID, ED: LACTIC ACID, VENOUS: 4.5 mmol/L — AB (ref 0.5–1.9)

## 2016-12-04 MED ORDER — LIDOCAINE HCL (PF) 1 % IJ SOLN
INTRAMUSCULAR | Status: AC
  Filled 2016-12-04: qty 5

## 2016-12-04 MED ORDER — SODIUM CHLORIDE 0.9 % IV BOLUS (SEPSIS)
500.0000 mL | Freq: Once | INTRAVENOUS | Status: AC
  Administered 2016-12-04: 500 mL via INTRAVENOUS

## 2016-12-04 MED ORDER — SODIUM CHLORIDE 0.9 % IV BOLUS (SEPSIS): 250.0000 mL | Freq: Once | INTRAVENOUS | Status: DC

## 2016-12-04 MED ORDER — SODIUM CHLORIDE 0.9 % IV BOLUS (SEPSIS)
1000.0000 mL | Freq: Once | INTRAVENOUS | Status: AC
  Administered 2016-12-04: 1000 mL via INTRAVENOUS

## 2016-12-04 MED ORDER — PIPERACILLIN-TAZOBACTAM 3.375 G IVPB 30 MIN
3.3750 g | Freq: Once | INTRAVENOUS | Status: AC
  Administered 2016-12-04: 3.375 g via INTRAVENOUS
  Filled 2016-12-04: qty 50

## 2016-12-04 MED ORDER — VANCOMYCIN HCL IN DEXTROSE 1-5 GM/200ML-% IV SOLN
1000.0000 mg | Freq: Once | INTRAVENOUS | Status: AC
  Administered 2016-12-04: 1000 mg via INTRAVENOUS
  Filled 2016-12-04: qty 200

## 2016-12-04 NOTE — ED Notes (Signed)
Pt has received ns at this time. Pt status the same. Son in talking with dr at this time. Pt has redness with warmth noted to left anterior thigh, area marked.pt also has raw spot to left labia.

## 2016-12-04 NOTE — ED Notes (Signed)
2nd liter is warmed

## 2016-12-04 NOTE — ED Notes (Signed)
In/out cath x 2 performed by 2 different staff with no urine output.

## 2016-12-04 NOTE — ED Notes (Signed)
blaNKET Warmer in progress.

## 2016-12-04 NOTE — ED Triage Notes (Addendum)
Hx of dementia. Pt lives with family at home. Family called for hypotension. Pt alert at times. Oriented to person. Pt cold to touch. Ems states family states pt has been acitng different x 3 days.

## 2016-12-04 NOTE — ED Provider Notes (Signed)
[facility]-EMERGENCY DEPT Provider Note   CSN: [id number] Arrival date & time: 12/14/2016  2134   By signing my name below, I, [name], attest that this documentation has been prepared under the direction and in the presence of Deanna Memos, MD. Electronically Signed: [name], Scribe. 12/14/2016. 9:48 PM. History   Chief Complaint Chief Complaint  Patient presents with  . Hypotension    The history is provided by the patient. The history is limited by the condition of the patient. No language interpreter was used.  LEVEL 5 CAVEAT: Acuity of condition (Hypotension and Altered Mental Status) HPI Comments: [name] is a 83 y.o. female brought in by ambulance, who presents to the Emergency Department with hypotension. The family called EMS after taking the patient's blood pressure and noticing that it was low. The family also stated that the patient has not been acting normally for the past 3 days. She reports no difficulty urinating. She denies any pain. It is noted that she has a productive cough. Past Medical History:  Diagnosis Date  . Anemia   . Arthritis   . Chronic systolic CHF (congestive heart failure) (HCC)    a. echo 06/14/14: EF 20-25%, diffuse HK, AK of mid-apicalinferolateral myocardium, DD, PASP 44 mm Hg; b. echo 11/2014 EF 30-35%, diastolic dysfunction, global HK, unable to exclude RWMA, mod MR; c. 01/2015 Echo: EF 45-50%, mild diff HK, gr1 DD, triv AI, mild MR, mildly dil LA, nl RV/PASP.  [name] Complication of anesthesia   . COPD (chronic obstructive pulmonary disease) (HCC)    a. on home O2  . Dementia   . ESRD (end stage renal disease) (HCC)    a. HD MWF; b. multiple fistula revisions now using permacath.  . History of blood transfusion    a. after fistula ruptured  . History of pneumonia 05/2013  . PONV (postoperative nausea and vomiting)   . Presumed CAD    a. presumed CAD - patient's son has declined cath/ischemic eval to date  . Secondary  hyperparathyroidism (HCC)   . Shortness of breath dyspnea    doe    Patient Active Problem List   Diagnosis Date Noted  . Fluid overload 07/15/2016  . Hyperkalemia 07/15/2016  . Elevated troponin 07/15/2016  . ESRD on hemodialysis (HCC)   . Complication of dialysis access insertion 09/28/2015  . Hematoma 06/29/2015  . CAD (coronary artery disease) 01/07/2015  . Infection of AV graft for dialysis (HCC) 01/07/2015  . COPD (chronic obstructive pulmonary disease) (HCC)   . Chronic systolic CHF (congestive heart failure) (HCC)   . Urinary tract infection 06/08/2014  . Respiratory failure (HCC) 02/05/2014  . Dementia 02/05/2014  . Toxic metabolic encephalopathy 12/23/2013  . Anemia of chronic disease 12/23/2013  . UTI (lower urinary tract infection) 12/20/2013  . Swelling of limb 11/06/2013  . Other complications due to renal dialysis device, implant, and graft 11/06/2013  . Removal of staples 06/19/2013  . End stage renal disease (HCC) 06/19/2013  . Pseudoaneurysm of arteriovenous dialysis fistula (HCC) 06/19/2013  . ESRD (end stage renal disease) on dialysis (HCC) 06/09/2013  . HCAP (healthcare-associated pneumonia) 06/09/2013  . Thrombocytopenia (HCC) 06/04/2013  . Acute blood loss anemia 06/02/2013  . Clostridium difficile colitis 04/30/2013  . Acute respiratory failure (HCC) 04/29/2013  . Pleural effusion 04/28/2013  . Volume overload 04/28/2013  . CKD (chronic kidney disease) stage V requiring chronic dialysis (HCC) 04/25/2013  . Aspiration pneumonia (HCC) 04/25/2013  . Senile dementia 04/25/2013  . CAP (community  acquired pneumonia) 04/25/2013    Past Surgical History:  Procedure Laterality Date  . AV FISTULA PLACEMENT Right 07/07/2013   Procedure: ARTERIOVENOUS (AV) FISTULA CREATION - RIGHT BRACHIAL CEPHALIC ;  Surgeon: Fransisco Hertz, MD;  Location: [facility] OR;  Service: Vascular;  Laterality: Right;  . CAPD INSERTION N/A 05/25/2016   Procedure: LAPAROSCOPIC INSERTION  CONTINUOUS AMBULATORY PERITONEAL DIALYSIS  (CAPD) CATHETER;  Surgeon: Renford Dills, MD;  Location: [facility] ORS;  Service: Vascular;  Laterality: N/A;  . CHOLECYSTECTOMY  1992  . DIALYSIS FISTULA CREATION    . EXCHANGE OF A DIALYSIS CATHETER Left 12/17/2013   Procedure: EXCHANGE OF A DIALYSIS CATHETER;  Surgeon: Chuck Hint, MD;  Location: [facility] OR;  Service: Vascular;  Laterality: Left;  . FISTULOGRAM Right 12/17/2013   Procedure: FISTULOGRAM- RIGHT ARM- POSSIBLE INTERVENTION;  Surgeon: Chuck Hint, MD;  Location: [facility] OR;  Service: Vascular;  Laterality: Right;  . HEMATOMA EVACUATION Right 05/25/2016   Procedure: EVACUATION HEMATOMA ( DRAINAGE );  Surgeon: Renford Dills, MD;  Location: [facility] ORS;  Service: Vascular;  Laterality: Right;  . INSERTION OF DIALYSIS CATHETER Right 06/04/2013   Procedure: INSERTION OF DIALYSIS CATHETER;  Surgeon: Fransisco Hertz, MD;  Location: [facility] OR;  Service: Vascular;  Laterality: Right;  . INSERTION OF DIALYSIS CATHETER Left 11/09/2013   Procedure: INSERTION OF DIALYSIS CATHETER- Left TDC;  Surgeon: Fransisco Hertz, MD;  Location: [facility] OR;  Service: Vascular;  Laterality: Left;  . LIGATION OF ARTERIOVENOUS  FISTULA Left 06/04/2013   Procedure: LIGATION OF ARTERIOVENOUS  FISTULA;  Surgeon: Fransisco Hertz, MD;  Location: [facility] OR;  Service: Vascular;  Laterality: Left;  . LIGATION OF ARTERIOVENOUS  FISTULA Right 01/19/2014   Procedure: LIGATION OF ARTERIOVENOUS  FISTULA- RIGHT BRACHIOCEPHALIC FISTULA;  Surgeon: Fransisco Hertz, MD;  Location: [facility] OR;  Service: Vascular;  Laterality: Right;  . LIGATION OF COMPETING BRANCHES OF ARTERIOVENOUS FISTULA Right 12/17/2013   Procedure: LIGATION OF COMPETING BRANCHES OF ARTERIOVENOUS FISTULA;  Surgeon: Chuck Hint, MD;  Location: [facility] OR;  Service: Vascular;  Laterality: Right;  . PERIPHERAL VASCULAR CATHETERIZATION N/A 06/28/2015   Procedure: Dialysis/Perma Catheter Insertion;  Surgeon: Renford Dills, MD;  Location: [facility]  INVASIVE CV LAB;  Service: Cardiovascular;  Laterality: N/A;  . PERIPHERAL VASCULAR CATHETERIZATION N/A 09/28/2015   Procedure: Dialysis/Perma Catheter Insertion;  Surgeon: Renford Dills, MD;  Location: [facility] INVASIVE CV LAB;  Service: Cardiovascular;  Laterality: N/A;  . PERIPHERAL VASCULAR CATHETERIZATION N/A 01/03/2016   Procedure: Dialysis/Perma Catheter Insertion;  Surgeon: Renford Dills, MD;  Location: [facility] INVASIVE CV LAB;  Service: Cardiovascular;  Laterality: N/A;  . PERIPHERAL VASCULAR CATHETERIZATION N/A 01/26/2016   Procedure: Dialysis/Perma Catheter Removal;  Surgeon: Annice Needy, MD;  Location: [facility] INVASIVE CV LAB;  Service: Cardiovascular;  Laterality: N/A;  . PERIPHERAL VASCULAR CATHETERIZATION Left 08/23/2016   Procedure: Dialysis/Perma Catheter Removal;  Surgeon: Annice Needy, MD;  Location: [facility] INVASIVE CV LAB;  Service: Cardiovascular;  Laterality: Left;  . REMOVAL OF A DIALYSIS CATHETER Right 11/09/2013   Procedure: REMOVAL OF A DIALYSIS CATHETER- Right TDC ;  Surgeon: Fransisco Hertz, MD;  Location: [facility] OR;  Service: Vascular;  Laterality: Right;  . SHUNTOGRAM Right 09/10/2013   Procedure: Fistulogram;  Surgeon: Fransisco Hertz, MD;  Location: [facility] CATH LAB;  Service: Cardiovascular;  Laterality: Right;    OB History    No data available       Home Medications    Prior to Admission  medications   Medication Sig Start Date End Date Taking? Authorizing Provider  acetaminophen (TYLENOL) 325 MG tablet Take 325-650 mg by mouth every 6 (six) hours as needed for mild pain.    Yes Historical Provider, MD  ALPRAZolam Prudy Feeler) 0.5 MG tablet Take 0.5 mg by mouth daily as needed for anxiety.  07/03/16  Yes Historical Provider, MD  Amino Acid Infusion (PROSOL) 20 % SOLN Inject 4 L into the vein 7 days. 06/24/15  Yes Historical Provider, MD  calcitRIOL (ROCALTROL) 0.25 MCG capsule Take 0.25 mcg by mouth daily.   Yes Historical Provider, MD  carvedilol (COREG) 3.125 MG tablet Take 3.125 mg  by mouth 2 (two) times daily with a meal.  06/22/16  Yes Historical Provider, MD  cinacalcet (SENSIPAR) 30 MG tablet Take 30 mg by mouth daily.   Yes Historical Provider, MD  clopidogrel (PLAVIX) 75 MG tablet Take 75 mg by mouth daily.  01/04/15  Yes Historical Provider, MD  donepezil (ARICEPT) 10 MG tablet Take 10 mg by mouth at bedtime.   Yes Historical Provider, MD  gentamicin cream (GARAMYCIN) 0.1 % Apply 1 application topically daily. 11/23/16  Yes Historical Provider, MD  Memantine HCl ER (NAMENDA XR) 28 MG CP24 Take 28 mg by mouth daily.   Yes Historical Provider, MD  multivitamin (RENA-VIT) TABS tablet Take 1 tablet by mouth daily.   Yes Historical Provider, MD  potassium chloride SA (K-DUR,KLOR-CON) 20 MEQ tablet Take 20 mEq by mouth daily.   Yes Historical Provider, MD  sevelamer carbonate (RENVELA) 800 MG tablet Take 800-1,600 mg by mouth 3 (three) times daily with meals. 2 with meals and 1 with snacks   Yes Historical Provider, MD  torsemide (DEMADEX) 20 MG tablet Take 20 mg by mouth daily.   Yes Historical Provider, MD    Family History Family History  Problem Relation Age of Onset  . Family history unknown: Yes    Social History Social History  Substance Use Topics  . Smoking status: Never Smoker  . Smokeless tobacco: Never Used  . Alcohol use No     Allergies   Sulfa antibiotics; Sulfur; Aspirin; and Lipitor [atorvastatin]   Review of Systems Review of Systems  Unable to perform ROS: Acuity of condition (Severe Hypotension and Altered Mental Status)     Physical Exam Updated Vital Signs BP (!) 91/43   Pulse 90   Temp (!) 95.4 F (35.2 C) (Rectal)   Resp 23   Ht 5\' 4"  (1.626 m)   Wt 115 lb (52.2 kg)   SpO2 90%   BMI 19.74 kg/m   Physical Exam  Constitutional: She is oriented to person, place, and time. She appears well-developed and well-nourished.  HENT:  Head: Normocephalic and atraumatic.  Eyes: EOM are normal. Pupils are equal, round, and reactive to  light.  Neck: Normal range of motion.  Cardiovascular: Normal rate and regular rhythm.   Pulmonary/Chest: Effort normal. She has wheezes. She has rales.  Crackles in left and right bases.  Abdominal: Soft. Bowel sounds are normal.  Abdomen is benign.  Neurological: She is alert and oriented to person, place, and time.  Skin: Skin is warm and dry.  She has a 4x8 cm erythematous rash to left anterior thigh that is warm and non-tender. She has multiple scars to upper extremities.  Nursing note and vitals reviewed.    ED Treatments / Results  DIAGNOSTIC STUDIES: Oxygen Saturation is 100% on RA, normal by my interpretation.    Labs (all labs  ordered are listed, but only abnormal results are displayed) Labs Reviewed  COMPREHENSIVE METABOLIC PANEL - Abnormal; Notable for the following:       Result Value   Chloride 96 (*)    BUN 49 (*)    Creatinine, Ser 7.06 (*)    Calcium 7.7 (*)    Total Protein 5.5 (*)    Albumin 1.8 (*)    GFR calc non Af Amer 5 (*)    GFR calc Af Amer 5 (*)    Anion gap 17 (*)    All other components within normal limits  CBC WITH DIFFERENTIAL/PLATELET - Abnormal; Notable for the following:    WBC 18.4 (*)    RBC 2.87 (*)    Hemoglobin 9.1 (*)    HCT 28.6 (*)    RDW 16.6 (*)    Neutro Abs 16.9 (*)    Lymphs Abs 0.6 (*)    All other components within normal limits  I-STAT CG4 LACTIC ACID, ED - Abnormal; Notable for the following:    Lactic Acid, Venous 4.50 (*)    All other components within normal limits  CULTURE, BLOOD (ROUTINE X 2)  CULTURE, BLOOD (ROUTINE X 2)  URINE CULTURE  BODY FLUID CULTURE  TSH  URINALYSIS, ROUTINE W REFLEX MICROSCOPIC  I-STAT TROPOININ, ED  I-STAT CG4 LACTIC ACID, ED    EKG  EKG Interpretation  Date/Time:  Tuesday December 04 2016 22:01:19 EST Ventricular Rate:  64 PR Interval:    QRS Duration: 103 QT Interval:  518 QTC Calculation: 535 R Axis:   -67 Text Interpretation:  Sinus or ectopic atrial rhythm Prolonged  PR interval Inferior infarct, old Consider anterior infarct Lateral leads are also involved Prolonged QT interval Confirmed by [facility] HospitalMESNER MD, Barbara CowerJASON [phone]([id number]) on 2017-01-25 10:32:20 PM Also confirmed by Clayborne DanaMESNER MD, Barbara CowerJASON [phone]([id number])  on 2017-01-25 10:32:57 PM       Radiology Dg Chest Port 1 View  Result Date: 2017-01-25 CLINICAL DATA:  83 y/o F; code sepsis, hypotension tonight. Several days of productive cough. EXAM: PORTABLE CHEST 1 VIEW COMPARISON:  07/15/2016 chest radiograph FINDINGS: Stable cardiac silhouette given projection and technique. Aortic atherosclerosis with calcifications. Small left pleural effusion and left basilar consolidation. Coarse interstitial markings likely represents chronic lung disease. Left axillary vascular stent. Right-sided dialysis catheter tip projects over the right atrium. Chronic right-sided rib fractures. IMPRESSION: Small left pleural effusion and left basilar consolidation which may represent pneumonia or atelectasis. Coarse interstitial opacities probably represent chronic edema or interstitial lung disease. Aortic atherosclerosis. Electronically Signed   By: Mitzi HansenLance  Furusawa-Stratton M.D.   On: 02018-03-09 22:47    Procedures .Central Line Date/Time: 12/02/2016 1:14 AM Performed by: Deanna MemosMESNER, Latravion Graves Authorized by: Deanna MemosMESNER, Ivionna Verley   Consent:    Consent obtained:  Verbal   Consent given by:  Guardian   Risks discussed:  Arterial puncture, incorrect placement and infection   Alternatives discussed:  No treatment Pre-procedure details:    Hand hygiene: Hand hygiene performed prior to insertion     Sterile barrier technique: All elements of maximal sterile technique followed     Skin preparation:  ChloraPrep   Skin preparation agent: Skin preparation agent completely dried prior to procedure   Anesthesia (see MAR for exact dosages):    Anesthesia method:  Local infiltration   Local anesthetic:  Lidocaine 1% w/o epi Procedure details:    Location:  R femoral   Patient  position:  Flat   Procedural supplies:  Triple lumen   Catheter size:  7.5 Fr   Ultrasound guidance: yes     Sterile ultrasound techniques: Sterile gel and sterile probe covers were used     Number of attempts:  1   Successful placement: yes   Post-procedure details:    Post-procedure:  Dressing applied and line sutured   Assessment:  Blood return through all ports and free fluid flow   Patient tolerance of procedure:  Tolerated well, no immediate complications   CRITICAL CARE Performed by: [name] Total critical care time: 45 minutes Critical care time was exclusive of separately billable procedures and treating other patients. Critical care was necessary to treat or prevent imminent or life-threatening deterioration. Critical care was time spent personally by me on the following activities: development of treatment plan with patient and/or surrogate as well as nursing, discussions with consultants, evaluation of patient's response to treatment, examination of patient, obtaining history from patient or surrogate, ordering and performing treatments and interventions, ordering and review of laboratory studies, ordering and review of radiographic studies, pulse oximetry and re-evaluation of patient's condition.   Medications Ordered in ED Medications  sodium chloride 0.9 % bolus 250 mL (not administered)  lidocaine (PF) (XYLOCAINE) 1 % injection (not administered)  norepinephrine (LEVOPHED) 4 mg in dextrose 5 % 250 mL (0.016 mg/mL) infusion (2 mcg/min Intravenous New Bag/Given 2016-12-09 0107)  vasopressin (PITRESSIN) 40 Units in sodium chloride 0.9 % 250 mL (0.16 Units/mL) infusion (0.03 Units/min Intravenous New Bag/Given 12-09-16 0104)  sodium chloride 0.9 % bolus 500 mL (0 mLs Intravenous Stopped 12/01/2016 2220)  sodium chloride 0.9 % bolus 1,000 mL (0 mLs Intravenous Stopped Dec 09, 2016 0001)  vancomycin (VANCOCIN) IVPB 1000 mg/200 mL premix (0 mg Intravenous Stopped 2016/12/09 0015)    piperacillin-tazobactam (ZOSYN) IVPB 3.375 g (0 g Intravenous Stopped 12/02/2016 2309)     Initial Impression / Assessment and Plan / ED Course  I have reviewed the triage vital signs and the nursing notes.  Pertinent labs & imaging results that were available during my care of the patient were reviewed by me and considered in my medical decision making (see chart for details).  Clinical Course     83 yo w/ septic shock likely 2/2 pneumonia v bacteremia from teeth. Hypotensive even after 40 cc/kg so central line placed as above. Difficult to pass wire from left IJ, couldn't cannulate r IJ 2/2 small vessel, hypovolemia so right femoral CVC given. BP improved with same. Plan for ICU admission.   Dr. Sharl Ma doesn't feel patient is appropriate for this facility 2/2 critical needs, nephrology, possibly becoming volume overloaded so I spoke to Dr. Arsenio Loader with CCM who stated there were no beds at [facility]. 2/2 need for continuity of care, retalked to Dr. Sharl Ma about possibly admitting here so that CCM could be involved and direct care, he will evaluate patient and let us know. .    Final Clinical Impressions(s) / ED Diagnoses   Final diagnoses:  Septic shock (HCC)  Community acquired pneumonia, unspecified laterality    New Prescriptions New Prescriptions   No medications on file   I personally performed the services described in this documentation, which was scribed in my presence. The recorded information has been reviewed and is accurate.    Deanna Memos, MD December 09, 2016 [phone]

## 2016-12-04 NOTE — ED Notes (Signed)
Dr Clayborne Danamesner notified of lactic acid 4.5

## 2016-12-04 NOTE — ED Notes (Signed)
Fluids changed to left side due to son concerned about r arm hero graftl. vo dr Erroll Lunamezner

## 2016-12-05 ENCOUNTER — Inpatient Hospital Stay (HOSPITAL_COMMUNITY): Payer: Medicare HMO

## 2016-12-05 DIAGNOSIS — I5022 Chronic systolic (congestive) heart failure: Secondary | ICD-10-CM | POA: Diagnosis present

## 2016-12-05 DIAGNOSIS — I2510 Atherosclerotic heart disease of native coronary artery without angina pectoris: Secondary | ICD-10-CM | POA: Diagnosis present

## 2016-12-05 DIAGNOSIS — R6521 Severe sepsis with septic shock: Secondary | ICD-10-CM

## 2016-12-05 DIAGNOSIS — Z7902 Long term (current) use of antithrombotics/antiplatelets: Secondary | ICD-10-CM | POA: Diagnosis not present

## 2016-12-05 DIAGNOSIS — J189 Pneumonia, unspecified organism: Secondary | ICD-10-CM

## 2016-12-05 DIAGNOSIS — N186 End stage renal disease: Secondary | ICD-10-CM | POA: Diagnosis present

## 2016-12-05 DIAGNOSIS — Z888 Allergy status to other drugs, medicaments and biological substances status: Secondary | ICD-10-CM | POA: Diagnosis not present

## 2016-12-05 DIAGNOSIS — J9610 Chronic respiratory failure, unspecified whether with hypoxia or hypercapnia: Secondary | ICD-10-CM

## 2016-12-05 DIAGNOSIS — Z886 Allergy status to analgesic agent status: Secondary | ICD-10-CM | POA: Diagnosis not present

## 2016-12-05 DIAGNOSIS — M1990 Unspecified osteoarthritis, unspecified site: Secondary | ICD-10-CM | POA: Diagnosis present

## 2016-12-05 DIAGNOSIS — Z992 Dependence on renal dialysis: Secondary | ICD-10-CM

## 2016-12-05 DIAGNOSIS — R4182 Altered mental status, unspecified: Secondary | ICD-10-CM | POA: Diagnosis present

## 2016-12-05 DIAGNOSIS — A419 Sepsis, unspecified organism: Secondary | ICD-10-CM | POA: Diagnosis present

## 2016-12-05 DIAGNOSIS — J440 Chronic obstructive pulmonary disease with acute lower respiratory infection: Secondary | ICD-10-CM | POA: Diagnosis present

## 2016-12-05 DIAGNOSIS — Z882 Allergy status to sulfonamides status: Secondary | ICD-10-CM | POA: Diagnosis not present

## 2016-12-05 DIAGNOSIS — D649 Anemia, unspecified: Secondary | ICD-10-CM | POA: Diagnosis present

## 2016-12-05 DIAGNOSIS — Z66 Do not resuscitate: Secondary | ICD-10-CM | POA: Diagnosis not present

## 2016-12-05 DIAGNOSIS — Z9981 Dependence on supplemental oxygen: Secondary | ICD-10-CM | POA: Diagnosis not present

## 2016-12-05 DIAGNOSIS — J9601 Acute respiratory failure with hypoxia: Secondary | ICD-10-CM | POA: Diagnosis present

## 2016-12-05 DIAGNOSIS — N2581 Secondary hyperparathyroidism of renal origin: Secondary | ICD-10-CM | POA: Diagnosis present

## 2016-12-05 DIAGNOSIS — I469 Cardiac arrest, cause unspecified: Secondary | ICD-10-CM | POA: Diagnosis not present

## 2016-12-05 DIAGNOSIS — Z515 Encounter for palliative care: Secondary | ICD-10-CM | POA: Diagnosis not present

## 2016-12-05 DIAGNOSIS — F0390 Unspecified dementia without behavioral disturbance: Secondary | ICD-10-CM | POA: Diagnosis present

## 2016-12-05 DIAGNOSIS — I132 Hypertensive heart and chronic kidney disease with heart failure and with stage 5 chronic kidney disease, or end stage renal disease: Secondary | ICD-10-CM | POA: Diagnosis present

## 2016-12-05 LAB — CBC WITH DIFFERENTIAL/PLATELET
BASOS ABS: 0 10*3/uL (ref 0.0–0.1)
Basophils Relative: 0 %
EOS ABS: 0 10*3/uL (ref 0.0–0.7)
Eosinophils Relative: 0 %
HCT: 28.7 % — ABNORMAL LOW (ref 36.0–46.0)
Hemoglobin: 8.9 g/dL — ABNORMAL LOW (ref 12.0–15.0)
LYMPHS PCT: 16 %
Lymphs Abs: 3.3 10*3/uL (ref 0.7–4.0)
MCH: 31.6 pg (ref 26.0–34.0)
MCHC: 31 g/dL (ref 30.0–36.0)
MCV: 101.8 fL — ABNORMAL HIGH (ref 78.0–100.0)
MONOS PCT: 4 %
Monocytes Absolute: 0.8 10*3/uL (ref 0.1–1.0)
NEUTROS PCT: 80 %
Neutro Abs: 16.6 10*3/uL — ABNORMAL HIGH (ref 1.7–7.7)
Platelets: 110 10*3/uL — ABNORMAL LOW (ref 150–400)
RBC: 2.82 MIL/uL — ABNORMAL LOW (ref 3.87–5.11)
RDW: 16.8 % — ABNORMAL HIGH (ref 11.5–15.5)
WBC MORPHOLOGY: INCREASED
WBC: 20.7 10*3/uL — ABNORMAL HIGH (ref 4.0–10.5)

## 2016-12-05 LAB — COMPREHENSIVE METABOLIC PANEL
ALK PHOS: 160 U/L — AB (ref 38–126)
ALT: 149 U/L — ABNORMAL HIGH (ref 14–54)
ANION GAP: 14 (ref 5–15)
AST: 233 U/L — ABNORMAL HIGH (ref 15–41)
Albumin: 1.7 g/dL — ABNORMAL LOW (ref 3.5–5.0)
BUN: 47 mg/dL — ABNORMAL HIGH (ref 6–20)
CALCIUM: 7.5 mg/dL — AB (ref 8.9–10.3)
CO2: 19 mmol/L — ABNORMAL LOW (ref 22–32)
Chloride: 100 mmol/L — ABNORMAL LOW (ref 101–111)
Creatinine, Ser: 6.57 mg/dL — ABNORMAL HIGH (ref 0.44–1.00)
GFR calc non Af Amer: 5 mL/min — ABNORMAL LOW (ref >60)
GFR, EST AFRICAN AMERICAN: 6 mL/min — AB (ref >60)
Glucose, Bld: 189 mg/dL — ABNORMAL HIGH (ref 65–99)
POTASSIUM: 5.2 mmol/L — AB (ref 3.5–5.1)
SODIUM: 133 mmol/L — AB (ref 135–145)
TOTAL PROTEIN: 5.7 g/dL — AB (ref 6.5–8.1)
Total Bilirubin: 0.5 mg/dL (ref 0.3–1.2)

## 2016-12-05 LAB — I-STAT CG4 LACTIC ACID, ED
LACTIC ACID, VENOUS: 2.05 mmol/L — AB (ref 0.5–1.9)
LACTIC ACID, VENOUS: 4.89 mmol/L — AB (ref 0.5–1.9)

## 2016-12-05 LAB — I-STAT TROPONIN, ED: TROPONIN I, POC: 0.01 ng/mL (ref 0.00–0.08)

## 2016-12-05 LAB — BLOOD GAS, ARTERIAL
ACID-BASE EXCESS: 7.2 mmol/L — AB (ref 0.0–2.0)
BICARBONATE: 18.2 mmol/L — AB (ref 20.0–28.0)
Delivery systems: POSITIVE
Drawn by: 22223
EXPIRATORY PAP: 6
FIO2: 50
Inspiratory PAP: 14
O2 SAT: 86.5 %
PCO2 ART: 40.2 mmHg (ref 32.0–48.0)
PH ART: 7.281 — AB (ref 7.350–7.450)
PO2 ART: 64.1 mmHg — AB (ref 83.0–108.0)
RATE: 12 resp/min

## 2016-12-05 MED ORDER — MORPHINE BOLUS VIA INFUSION
5.0000 mg | INTRAVENOUS | Status: DC | PRN
  Filled 2016-12-05: qty 20

## 2016-12-05 MED ORDER — MIDAZOLAM BOLUS VIA INFUSION (WITHDRAWAL LIFE SUSTAINING TX)
5.0000 mg | INTRAVENOUS | Status: DC | PRN
  Filled 2016-12-05: qty 20

## 2016-12-05 MED ORDER — ONDANSETRON HCL 4 MG/2ML IJ SOLN: 4.0000 mg | Freq: Four times a day (QID) | INTRAMUSCULAR | Status: DC | PRN

## 2016-12-05 MED ORDER — VASOPRESSIN 20 UNIT/ML IV SOLN
INTRAVENOUS | Status: AC
  Filled 2016-12-05: qty 2

## 2016-12-05 MED ORDER — PROPOFOL 1000 MG/100ML IV EMUL: 0.0000 ug/kg/min | INTRAVENOUS | Status: DC

## 2016-12-05 MED ORDER — MIDAZOLAM HCL 2 MG/2ML IJ SOLN
INTRAMUSCULAR | Status: AC
  Administered 2016-12-05: 2 mg
  Filled 2016-12-05: qty 2

## 2016-12-05 MED ORDER — ATROPINE SULFATE 1 MG/ML IJ SOLN
INTRAMUSCULAR | Status: AC
  Filled 2016-12-05: qty 1

## 2016-12-05 MED ORDER — MIDAZOLAM HCL 10 MG/2ML IJ SOLN
INTRAMUSCULAR | Status: AC
  Filled 2016-12-05: qty 2

## 2016-12-05 MED ORDER — SODIUM CHLORIDE 0.9 % IV SOLN
10.0000 mg/h | INTRAVENOUS | Status: DC
  Administered 2016-12-05: 10 mg/h via INTRAVENOUS
  Filled 2016-12-05: qty 10

## 2016-12-05 MED ORDER — PIPERACILLIN-TAZOBACTAM 3.375 G IVPB: 3.3750 g | Freq: Two times a day (BID) | INTRAVENOUS | Status: DC

## 2016-12-05 MED ORDER — SODIUM CHLORIDE 0.9 % IV SOLN
Freq: Once | INTRAVENOUS | Status: AC
Start: 2016-12-05 — End: 2016-12-05
  Administered 2016-12-05: 03:00:00 via INTRAVENOUS

## 2016-12-05 MED ORDER — ONDANSETRON HCL 4 MG PO TABS: 4.0000 mg | ORAL_TABLET | Freq: Four times a day (QID) | ORAL | Status: DC | PRN

## 2016-12-05 MED ORDER — NOREPINEPHRINE BITARTRATE 1 MG/ML IV SOLN
2.0000 ug/min | INTRAVENOUS | Status: DC
  Administered 2016-12-05: 2 ug/min via INTRAVENOUS
  Filled 2016-12-05: qty 4

## 2016-12-05 MED ORDER — ATROPINE SULFATE 1 MG/ML IJ SOLN
INTRAMUSCULAR | Status: DC | PRN
  Administered 2016-12-05: 1 mg via INTRAVENOUS

## 2016-12-05 MED ORDER — MIDAZOLAM HCL 2 MG/2ML IJ SOLN
INTRAMUSCULAR | Status: AC
  Filled 2016-12-05: qty 2

## 2016-12-05 MED ORDER — MIDAZOLAM HCL 5 MG/5ML IJ SOLN
INTRAMUSCULAR | Status: DC | PRN
  Administered 2016-12-05: 2 mg via INTRAVENOUS

## 2016-12-05 MED ORDER — HEPARIN SODIUM (PORCINE) 5000 UNIT/ML IJ SOLN: 5000.0000 [IU] | Freq: Three times a day (TID) | INTRAMUSCULAR | Status: DC

## 2016-12-05 MED ORDER — NOREPINEPHRINE BITARTRATE 1 MG/ML IV SOLN
INTRAVENOUS | Status: AC
Start: 2016-12-05 — End: 2016-12-05
  Filled 2016-12-05: qty 4

## 2016-12-05 MED ORDER — EPINEPHRINE PF 1 MG/10ML IJ SOSY
PREFILLED_SYRINGE | INTRAMUSCULAR | Status: AC | PRN
  Administered 2016-12-05: 1 mg via INTRAVENOUS

## 2016-12-05 MED ORDER — VASOPRESSIN 20 UNIT/ML IV SOLN
0.0300 [IU]/min | INTRAVENOUS | Status: DC
  Administered 2016-12-05: 0.03 [IU]/min via INTRAVENOUS
  Filled 2016-12-05: qty 2

## 2016-12-05 MED ORDER — AMIODARONE HCL 150 MG/3ML IV SOLN
150.0000 mg | Freq: Once | INTRAVENOUS | Status: AC
  Administered 2016-12-05: 150 mg via INTRAVENOUS

## 2016-12-07 MED FILL — Medication: Qty: 1 | Status: AC

## 2016-12-10 LAB — CULTURE, BLOOD (ROUTINE X 2)
CULTURE: NO GROWTH
CULTURE: NO GROWTH

## 2016-12-20 NOTE — ED Provider Notes (Addendum)
1:42 AM  Patient signed out at shift change. She is in septic shock likely secondary to pneumonia versus bacteremia from dental issues. She is on vasopressin and levophed.  Current blood pressure 99/44. She's not intubated. Mental status remains intact. History of dialysis and concern for volume overload given fluid resuscitation. She has been accepted to the ICU at [facility]; however, she does not have a bed and there are no available beds. She is #3 in line. Discussed with Dr. Sharl Ma the possibility of temporary admission to the [facility] ICU for patient care and transfer as needed. Patient needs intubation, she will be intubated by the ED doc. Dialysis capabilities in the morning.  If patient does not have an ICU bed at [facility] by 5 AM, will reconsult Dr. Sharl Ma for admission to the ICU here.  3:45 AM Family member at the bedside concern for increased respiratory rate. Patient respiratory rate 35. She does have Rales bilaterally. I have requested increased head of bed. ABG ordered. Will trial on BiPAP as patient's mental status tolerates.  Sepsis - Repeat Assessment  Performed at:    0345  Vitals     Blood pressure 108/58, pulse 92, temperature 99.6 F (37.6 C), temperature source Rectal, resp. rate (!) 30, height 5\' 4"  (1.626 m), weight 115 lb (52.2 kg), SpO2 96 %.  Heart:     Tachycardic  Lungs:    Rales and Rhonchi  Capillary Refill:   <2 sec  Peripheral Pulse:   Radial pulse palpable  Skin:     Pale  4:42 AM Still no beds available at [facility]. Pre-BiPAP ABG with mild acidosis at 7.28. Low to 64 bicarbonate 18. She appears to be tolerating BiPAP well. Discussed with Dr. Sharl Ma. He is agreed to admission to the ICU [facility] while awaiting bed at [facility].  4:55 AM Called to the bedside for bradycardia. Patient is perfusing but is notably bradycardic.  Patient was given atropine with transient increase in heart rate. She subsequently lost pulses. She received 1 mg of epi, 2  rounds of CPR. Was intubated. Return of circulation in approximately 2-1/2 minutes. She was now on a right complex irregular tachycardia. Feel this is likely a abberant atrial fibrillation. Cardioversion unsuccessful. Patient was given 150 of amiodarone. Repeat EKG shows ST elevations inferiorly. Patient's son was updated. Goals of care to address. Patient has a very prognosis at this point. Son does not wish to pursue cardiac catheterization.  He furthermore wishes her to be DO NOT RESUSCITATE and wants her to be comfortable. At this time no escalation of care. Dr. Sharl Ma to admit to the ICU.  EKG #2  EKG Interpretation  Date/Time:  Wednesday 12-26-2016 04:52:34 EST Ventricular Rate:  60 PR Interval:    QRS Duration: 96 QT Interval:  510 QTC Calculation: 510 R Axis:   -66 Text Interpretation:  Atrial fibrillation Inferior infarct, old Anterolateral infarct, age indeterminate Prolonged QT interval Confirmed by Wilkie Aye  MD, Ivry Pigue ([id number]) on December 26, 2016 6:03:09 AM      EKG #3   EKG Interpretation  Date/Time:  Wednesday 12/26/16 05:02:29 EST Ventricular Rate:  156 PR Interval:    QRS Duration: 139 QT Interval:  304 QTC Calculation: 490 R Axis:   -78 Text Interpretation:  Wide-QRS tachycardia Nonspecific IVCD with LAD Confirmed by Wilkie Aye  MD, Ravis Herne ([id number]) on 2016-12-26 6:03:59 AM      EKG #4   EKG Interpretation  Date/Time:  Wednesday 12-26-16  05:08:43 EST Ventricular Rate:  88 PR Interval:    QRS Duration: 135 QT Interval:  363 QTC Calculation: 440 R Axis:   -69 Text Interpretation:  Sinus rhythm Prolonged PR interval Nonspecific IVCD with LAD Inferior infarct, acute (RCA) Anterior infarct, old Lateral leads are also involved Probable RV involvement, suggest recording right precordial leads Confirmed by Rhyli Depaula  MD, Toni AmendOURTNEY ([id number]) on 05-01-17 6:04:42 AM       Cardiopulmonary Resuscitation (CPR) Procedure Note Directed/Performed by: Shon BatonHORTON, Marton Malizia  F I personally directed ancillary staff and/or performed CPR in an effort to regain return of spontaneous circulation and to maintain cardiac, neuro and systemic perfusion.   INTUBATION Performed by: Shon BatonHORTON, Orman Matsumura F  Required items: required blood products, implants, devices, and special equipment available Patient identity confirmed: provided demographic data and hospital-assigned identification number Time out: Immediately prior to procedure a "time out" was called to verify the correct patient, procedure, equipment, support staff and site/side marked as required.  Indications: CPR  Intubation method: Direct  Preoxygenation: BVM  Sedatives: none Paralytic: none  Tube Size: 7.5 cuffed  Post-procedure assessment: chest rise and ETCO2 monitor Breath sounds: equal and absent over the epigastrium Tube secured with: ETT holder  Prior to x-ray, patient noted to have a cuff leak. Fairly severe. She is maintaining oxygenation but unable to keep cuff up. Attempt to exchange failed secondary to dislodgment of 2. Patient was reintubated without difficulty.   Chest x-ray findings: endotracheal tube in appropriate position  Patient tolerated the procedure well with no immediate complications.     CRITICAL CARE Performed by: Shon BatonHORTON, Nessa Ramaker F   Total critical care time: 80 minutes  Critical care time was exclusive of separately billable procedures and treating other patients.  Critical care was necessary to treat or prevent imminent or life-threatening deterioration.  Critical care was time spent personally by me on the following activities: development of treatment plan with patient and/or surrogate as well as nursing, discussions with consultants, evaluation of patient's response to treatment, examination of patient, obtaining history from patient or surrogate, ordering and performing treatments and interventions, ordering and review of laboratory studies, ordering and review of  radiographic studies, pulse oximetry and re-evaluation of patient's condition.      Shon Batonourtney F Hardeep Reetz, MD 10/08/2017 [phone]    Shon Batonourtney F Phuong Hillary, MD 10/08/2017 [phone]

## 2016-12-20 NOTE — ED Notes (Signed)
Pt cardioverted at 200 joules due to wide complex tachycardia with pulses, pt rhythm changed to sinus tach rate of 120

## 2016-12-20 NOTE — Consult Note (Signed)
Luvenia StarchJean I Strothers MRN: [id number] DOB/AGE: 81/11/1930 81 y.o. Primary Care Physician:HAWKINS,EDWARD L, MD Admit date: 19-Jan-2017 Chief Complaint:  Chief Complaint  Patient presents with  . Hypotension   HPI: Pt is [age] year female with past medical history of ESRD who was brought to ER with chief complaint of low blood pressure.   HPI dates back to past 2-3 days  when pt family member noticed that she has been not acting her usual self.911 was called and upon evaluation by EMS pt was found to have low BP and brought to ER.  IN ER pt was  patient was hypotensive and was found to be in septic shock.  Pateint's lab data showing lactate of 4.50, BUN 49, creatinine 7.06, WBC 18.4. Pt was started on vancomycin and Zosyn. . Pt  was started on pressor support with vasopressin and norepinephrine. Pt was to be [facility] but pt pt had cardiac arrest. Pt was coded and in now admitted in ICU Pt intubated unable to offer any complaints   Past Medical History:  Diagnosis Date  . Anemia   . Arthritis   . Chronic systolic CHF (congestive heart failure) (HCC)    a. echo 06/14/14: EF 20-25%, diffuse HK, AK of mid-apicalinferolateral myocardium, DD, PASP 44 mm Hg; b. echo 11/2014 EF 30-35%, diastolic dysfunction, global HK, unable to exclude RWMA, mod MR; c. 01/2015 Echo: EF 45-50%, mild diff HK, gr1 DD, triv AI, mild MR, mildly dil LA, nl RV/PASP.  [name]. Complication of anesthesia   . COPD (chronic obstructive pulmonary disease) (HCC)    a. on home O2  . Dementia   . ESRD (end stage renal disease) (HCC)    a. HD MWF; b. multiple fistula revisions now using permacath.  . History of blood transfusion    a. after fistula ruptured  . History of pneumonia 05/2013  . PONV (postoperative nausea and vomiting)   . Presumed CAD    a. presumed CAD - patient's son has declined cath/ischemic eval to date  . Secondary hyperparathyroidism (HCC)   . Shortness of breath dyspnea    doe  Past Surgical hx S/p AVf  ligation S/P PCplacement       Family History  Problem Relation Age of Onset  . Family history unknown: Yes  NO family hx of ESRD  Social History:  reports that she has never smoked. She has never used smokeless tobacco. She reports that she does not drink alcohol or use drugs.   Allergies:  Allergies  Allergen Reactions  . Sulfa Antibiotics Nausea And Vomiting and Rash  . Sulfur Nausea And Vomiting and Rash  . Aspirin Nausea And Vomiting  . Lipitor [Atorvastatin]     Muscle aches , confusion    Medications Prior to Admission  Medication Sig Dispense Refill  . acetaminophen (TYLENOL) 325 MG tablet Take 325-650 mg by mouth every 6 (six) hours as needed for mild pain.     [name]. ALPRAZolam (XANAX) 0.5 MG tablet Take 0.5 mg by mouth daily as needed for anxiety.     . Amino Acid Infusion (PROSOL) 20 % SOLN Inject 4 L into the vein 7 days.    . calcitRIOL (ROCALTROL) 0.25 MCG capsule Take 0.25 mcg by mouth daily.    . carvedilol (COREG) 3.125 MG tablet Take 3.125 mg by mouth 2 (two) times daily with a meal.     . cinacalcet (SENSIPAR) 30 MG tablet Take 30 mg by mouth daily.    . clopidogrel (PLAVIX)  75 MG tablet Take 75 mg by mouth daily.     [name] donepezil (ARICEPT) 10 MG tablet Take 10 mg by mouth at bedtime.    [name] gentamicin cream (GARAMYCIN) 0.1 % Apply 1 application topically daily.    . Memantine HCl ER (NAMENDA XR) 28 MG CP24 Take 28 mg by mouth daily.    . multivitamin (RENA-VIT) TABS tablet Take 1 tablet by mouth daily.    . potassium chloride SA (K-DUR,KLOR-CON) 20 MEQ tablet Take 20 mEq by mouth daily.    . sevelamer carbonate (RENVELA) 800 MG tablet Take 800-1,600 mg by mouth 3 (three) times daily with meals. 2 with meals and 1 with snacks    . torsemide (DEMADEX) 20 MG tablet Take 20 mg by mouth daily.         ZOX:WRUEAV to get any hx   . lidocaine (PF)      . midazolam      . sodium chloride  250 mL Intravenous Once     Physical Exam: Vital signs in last 24  hours: Temp:  [94.6 F (34.8 C)-99.6 F (37.6 C)] 99.6 F (37.6 C) (01/17 0354) Pulse Rate:  [62-101] 62 (01/17 0600) Resp:  [16-45] 20 (01/17 0600) BP: (64-139)/(29-90) 76/41 (01/17 0600) SpO2:  [81 %-100 %] 100 % (01/17 0600) FiO2 (%):  [50 %-100 %] 100 % (01/17 0500) Weight:  [115 lb (52.2 kg)] 115 lb (52.2 kg) (01/16 2136) Weight change:     Intake/Output from previous day: 01/16 0701 - 01/17 0700 In: 2500 [IV Piggyback:2500] Out: -  Total I/O In: 2500 [IV Piggyback:2500] Out: -    Physical Exam: General- pt is intubated,unable to communicate Resp-   ET tube in situ, ronchi+ CVS- S1S2 regular in rate and rhythm GIT- BS+, soft, NT, ND EXT- NO LE Edema, Cyanosis   Lab Results: CBC  Recent Labs  11/25/2016 2212 11/28/2016 0510  WBC 18.4* 20.7*  HGB 9.1* 8.9*  HCT 28.6* 28.7*  PLT PLATELET CLUMPS NOTED ON SMEAR, COUNT APPEARS ADEQUATE 110*    BMET  Recent Labs  12/06/2016 2212 12/18/2016 0510  NA 137 133*  K 4.9 5.2*  CL 96* 100*  CO2 24 19*  GLUCOSE 92 189*  BUN 49* 47*  CREATININE 7.06* 6.57*  CALCIUM 7.7* 7.5*    MICRO Recent Results (from the past 240 hour(s))  Blood Culture (routine x 2)     Status: None (Preliminary result)   Collection Time: 05-Dec-2016 10:01 PM  Result Value Ref Range Status   Specimen Description BLOOD LEFT FOREARM  Final   Special Requests BOTTLES DRAWN AEROBIC AND ANAEROBIC 6CC  Final   Culture PENDING  Incomplete   Report Status PENDING  Incomplete  Blood Culture (routine x 2)     Status: None (Preliminary result)   Collection Time: 11/29/2016 10:10 PM  Result Value Ref Range Status   Specimen Description BLOOD RIGHT HAND  Final   Special Requests BOTTLES DRAWN AEROBIC AND ANAEROBIC 6CC  Final   Culture PENDING  Incomplete   Report Status PENDING  Incomplete      Lab Results  Component Value Date   PTH 495.2 (H) 06/10/2013   CALCIUM 7.5 (L) 11/24/2016   CAION 1.21 12/17/2013   PHOS 6.1 (H) 07/18/2016       Impression: 1)Renal  ESRD on peritoneal dialysis. Pt family does not want to pursue renal replacement therapy   2)CVS-admitted with Cardiac arrest On pressors  3)Anemia IN ESRD the goal for hgb is  9-11 HGb below the goal Will keep on epo  4)CKD Mineral-Bone Disorder  PTH over suppressed  Secondary Hyperparathyroidism Present.  Phosphorus not at goal.   5)CNS- hx of Dementia .  6)FEN  Hyperkalemic    Sec to ESRD Hyponatremic   sec to ESRD  7)Acid base  Co2 at goal   8) ID- admitted with Pneumonia/sepsis On ABX   9) Social    Pt family decided comfort care.    Plan:  Will sign off as pt is comfort care  Addendum Pt was extubated and passed away peacefully.       Johnelle Tafolla S 12/14/2016, 6:23 AM

## 2016-12-20 NOTE — Code Documentation (Signed)
Pt asystole on monitor, no pulse noted, cpr started, Dr Wilkie AyeHorton at bedside,

## 2016-12-20 NOTE — Discharge Summary (Signed)
Physician Discharge Summary  Patient ID: [name] MRN: [id number] DOB/AGE: 81/11/1930 83 y.o. Primary Care Physician:Maralyn Witherell L, MD Admit date: 11/21/2016 Discharge date: 12/06/2016    Discharge Diagnoses:   Active Problems:   CAP (community acquired pneumonia)   ESRD (end stage renal disease) on dialysis (HCC)   Respiratory failure (HCC)   Septic shock (HCC)   Allergies as of 2017-09-09      Reactions   Sulfa Antibiotics Nausea And Vomiting, Rash   Sulfur Nausea And Vomiting, Rash   Aspirin Nausea And Vomiting   Lipitor [atorvastatin]    Muscle aches , confusion      Medication List    ASK your doctor about these medications   acetaminophen 325 MG tablet Commonly known as:  TYLENOL Take 325-650 mg by mouth every 6 (six) hours as needed for mild pain.   ALPRAZolam 0.5 MG tablet Commonly known as:  XANAX Take 0.5 mg by mouth daily as needed for anxiety.   calcitRIOL 0.25 MCG capsule Commonly known as:  ROCALTROL Take 0.25 mcg by mouth daily.   carvedilol 3.125 MG tablet Commonly known as:  COREG Take 3.125 mg by mouth 2 (two) times daily with a meal.   cinacalcet 30 MG tablet Commonly known as:  SENSIPAR Take 30 mg by mouth daily.   clopidogrel 75 MG tablet Commonly known as:  PLAVIX Take 75 mg by mouth daily.   donepezil 10 MG tablet Commonly known as:  ARICEPT Take 10 mg by mouth at bedtime.   gentamicin cream 0.1 % Commonly known as:  GARAMYCIN Apply 1 application topically daily.   multivitamin Tabs tablet Take 1 tablet by mouth daily.   NAMENDA XR 28 MG Cp24 24 hr capsule Generic drug:  memantine Take 28 mg by mouth daily.   potassium chloride SA 20 MEQ tablet Commonly known as:  K-DUR,KLOR-CON Take 20 mEq by mouth daily.   PROSOL 20 % Soln Inject 4 L into the vein 7 days.   sevelamer carbonate 800 MG tablet Commonly known as:  RENVELA Take 800-1,600 mg by mouth 3 (three) times daily with meals. 2 with meals and 1 with snacks    torsemide 20 MG tablet Commonly known as:  DEMADEX Take 20 mg by mouth daily.       Discharged Condition: Deceased    Consults: None  Significant Diagnostic Studies: Dg Chest Portable 1 View  Result Date: 2017-09-09 CLINICAL DATA:  Intubation.  NG tube placement. EXAM: PORTABLE CHEST 1 VIEW COMPARISON:  Yesterday at 2226 hour FINDINGS: Endotracheal tube is 3 cm from the carina. Enteric tube in place, tip and side-port below the diaphragm. Right-sided dialysis catheter with tip at the atrial caval junction. Left subclavian stent. Multiple overlying monitoring devices partially obscure evaluation. Gross stable heart size and mediastinal contours. Atherosclerosis of the aortic arch. Increased interstitial opacities, left greater than right, concerning for pulmonary edema. Probable left pleural effusion. Questionable blunting of right costophrenic angle. IMPRESSION: 1. Endotracheal and enteric tubes in place. 2. Probable pulmonary edema superimposed on chronic lung disease. Suspect bilateral pleural effusions. Electronically Signed   By: Rubye OaksMelanie  Ehinger M.D.   On: 02018-10-22 06:21   Dg Chest Port 1 View  Result Date: 12/02/2016 CLINICAL DATA:  83 y/o F; code sepsis, hypotension tonight. Several days of productive cough. EXAM: PORTABLE CHEST 1 VIEW COMPARISON:  07/15/2016 chest radiograph FINDINGS: Stable cardiac silhouette given projection and technique. Aortic atherosclerosis with calcifications. Small left pleural effusion and left basilar consolidation. Coarse interstitial markings likely  represents chronic lung disease. Left axillary vascular stent. Right-sided dialysis catheter tip projects over the right atrium. Chronic right-sided rib fractures. IMPRESSION: Small left pleural effusion and left basilar consolidation which may represent pneumonia or atelectasis. Coarse interstitial opacities probably represent chronic edema or interstitial lung disease. Aortic atherosclerosis. Electronically  Signed   By: Mitzi Hansen M.D.   On: 12/11/2016 22:47    Lab Results: Basic Metabolic Panel:  Recent Labs  40/98/11 2212 December 30, 2016 0510  NA 137 133*  K 4.9 5.2*  CL 96* 100*  CO2 24 19*  GLUCOSE 92 189*  BUN 49* 47*  CREATININE 7.06* 6.57*  CALCIUM 7.7* 7.5*   Liver Function Tests:  Recent Labs  12/03/2016 2212 2016/12/30 0510  AST 19 233*  ALT 14 149*  ALKPHOS 109 160*  BILITOT 0.3 0.5  PROT 5.5* 5.7*  ALBUMIN 1.8* 1.7*     CBC:  Recent Labs  12/18/2016 2212 30-Dec-2016 0510  WBC 18.4* 20.7*  NEUTROABS 16.9* 16.6*  HGB 9.1* 8.9*  HCT 28.6* 28.7*  MCV 99.7 101.8*  PLT PLATELET CLUMPS NOTED ON SMEAR, COUNT APPEARS ADEQUATE 110*    Recent Results (from the past 240 hour(s))  Blood Culture (routine x 2)     Status: None (Preliminary result)   Collection Time: 12/11/2016 10:01 PM  Result Value Ref Range Status   Specimen Description BLOOD LEFT FOREARM  Final   Special Requests BOTTLES DRAWN AEROBIC AND ANAEROBIC 6CC  Final   Culture NO GROWTH 2 DAYS  Final   Report Status PENDING  Incomplete  Blood Culture (routine x 2)     Status: None (Preliminary result)   Collection Time: 12/14/2016 10:10 PM  Result Value Ref Range Status   Specimen Description BLOOD RIGHT HAND  Final   Special Requests BOTTLES DRAWN AEROBIC AND ANAEROBIC 6CC  Final   Culture NO GROWTH 2 DAYS  Final   Report Status PENDING  Incomplete     Hospital Course: This is an 83 year old who was found to be hypotensive and hypoxic at home. She came to the emergency department was found to be hypoxic and eventually had CODE BLUE. She was septic. She was intubated had CPR. Further discussion with her son revealed that although he wanted an initial resuscitation done he did not want her to remain on pressors and ventilator support and these were discontinued. She died soon after discontinuation of life support  Discharge Exam: Blood pressure 94/62, pulse (!) 54, temperature 97.5 F (36.4 C),  temperature source Axillary, resp. rate (!) 22, height 5\' 3"  (1.6 m), weight 52.5 kg (115 lb 11.9 oz), SpO2 100 %. Not applicable  Disposition: Body released to funeral home      Signed: Fontella Shan L   12/06/2016, 10:58 AM

## 2016-12-20 NOTE — Procedures (Signed)
Extubation Procedure Note  Patient Details:   Name: [name] DOB: 05/25/1931 MRN: [id number]   Airway Documentation:     Evaluation  O2 sats:99% Complications: no complications  Patient did tolerate procedure well. Bilateral Breath Sounds: Rhonchi   Pt is in a comatose state at this point. Pt was extubated without any issue and placed on a 2lpm . No stridor noted. RT will cont to monitor.   Lucia GaskinsJennifer D Brandy Zuba December 05, 2016, 9:12 AM

## 2016-12-20 NOTE — Progress Notes (Signed)
Patient was made Comfort and placed on pain management. After ET tube removed patient passed and death called at [phone] Dr. Juanetta GoslingHawkins stated he would sign death certificate. Post mortem started and completed as of last note.

## 2016-12-20 NOTE — Code Documentation (Signed)
Return of pulses noted, pt in wide complex tachycardia, Dr Wilkie AyeHorton at bedside,

## 2016-12-20 NOTE — ED Notes (Signed)
AC aware needing meds 

## 2016-12-20 NOTE — ED Notes (Addendum)
Pt up to 4L [place] at this time due to sats around 90% on 2.5L. Per son pt is on 2.5-3L [place] 02 at home. Pt having audible rattling at this time. Pt turned to left side per son request

## 2016-12-20 NOTE — ED Notes (Signed)
Family called me to bedside because of heartrate and oxygen level dropping dr Sharl Malama at bedside her heart rate was in the 30's pt had a weak pulse in her ej then

## 2016-12-20 NOTE — ED Notes (Signed)
Have been in with Dr. Erroll LunaMezner for last hour attempting Central line.

## 2016-12-20 NOTE — ED Notes (Signed)
Levophed titrated up to 4mcg vo dr Erroll LunaMezner

## 2016-12-20 NOTE — ED Notes (Signed)
Pt heart rate decreased to 40-50's,. Dr Sharl Malama and Dr Wilkie AyeHorton at bedside,

## 2016-12-20 NOTE — ED Notes (Signed)
Pt had bm. Pt and linen cleaned

## 2016-12-20 NOTE — Progress Notes (Signed)
This is an assumption of care note. The events of early this morning are noted. She was found to be hypotensive. She is septic. This is presumably from pneumonia versus dental abscess. She suffered cardiopulmonary arrest and required CPR. At baseline she has severe cardiomyopathy with cardiac ejection fraction around 20%, dementia, chronic renal failure on home peritoneal dialysis.  I discussed the situation with her son at bedside. He said that he wanted to have her get resuscitation initially to see if she could "bounce back". He now understands that this is very unlikely considering the fact that she's on 2 pressors still with hypotension on the ventilator requiring him 100% oxygen and the possibility that she's had further heart damage based on EKG. He wants to discontinue life support and I agree with that decision  Will plan to discontinue life support with the expectation that she will die fairly quickly  Greater than 50% of my visit was spent with counseling and formulating a plan

## 2016-12-20 NOTE — H&P (Addendum)
TRH H&P    Patient Demographics:    [name], is a 83 y.o. female  MRN: [id number]  DOB - 05/10/1931  Admit Date - 12/16/2016  Referring MD/NP/PA: Dr. Wilkie Aye  Outpatient Primary MD for the patient is Fredirick Maudlin, MD  Patient coming from: home   Chief Complaint  Patient presents with  . Hypotension      HPI:    [name]  is a 83 y.o. female, With history of ESRD on peritoneal dialysis, chronic systolic CHF with last echo showed EF 20-25%, COPD, dementia who was brought to the hospital by ambulance with hypotension. Family called EMS after checking patient's blood pressure and noticing that it was low. As per patient's son patient has been not acting her usual self over the past 3 days. Family noted that patient was gradually becoming lethargic today. She has 4 bottom teeth which needs to be pulled, and she was supposed to follow up dentist as outpatient.  In the ED, patient was hypotensive and was found to be in septic shock. With her lab work showing lactate of 4.50, BUN 49, creatinine 7.06, WBC 18.4 Empirically started on vancomycin and Zosyn. Chest x-ray showed interstitial edema versus pneumonia. She was started on pressor support with vasopressin and norepinephrin.  An attempt was made to transfer the patient to [facility] ICU. Patient was awaiting transfer to [facility] when she was found to have asystole on monitor, with no pulse. Patient was in PEA arrest. Underwent CPR for 2 minutes, cardioversion for wide complex tachycardia, also received amiodarone. And patient was back in normal sinus rhythm.   Review of systems:    In addition to the HPI above,  No other review of systems obtainable at this time    With Past History of the following :    Past Medical History:  Diagnosis Date  . Anemia   . Arthritis   . Chronic systolic CHF (congestive heart failure) (HCC)    a. echo  06/14/14: EF 20-25%, diffuse HK, AK of mid-apicalinferolateral myocardium, DD, PASP 44 mm Hg; b. echo 11/2014 EF 30-35%, diastolic dysfunction, global HK, unable to exclude RWMA, mod MR; c. 01/2015 Echo: EF 45-50%, mild diff HK, gr1 DD, triv AI, mild MR, mildly dil LA, nl RV/PASP.  [name] Complication of anesthesia   . COPD (chronic obstructive pulmonary disease) (HCC)    a. on home O2  . Dementia   . ESRD (end stage renal disease) (HCC)    a. HD MWF; b. multiple fistula revisions now using permacath.  . History of blood transfusion    a. after fistula ruptured  . History of pneumonia 05/2013  . PONV (postoperative nausea and vomiting)   . Presumed CAD    a. presumed CAD - patient's son has declined cath/ischemic eval to date  . Secondary hyperparathyroidism (HCC)   . Shortness of breath dyspnea    doe      Past Surgical History:  Procedure Laterality Date  . AV FISTULA PLACEMENT Right 07/07/2013   Procedure: ARTERIOVENOUS (AV) FISTULA  CREATION - RIGHT BRACHIAL CEPHALIC ;  Surgeon: Fransisco HertzBrian L Chen, MD;  [facility];  Service: Vascular;  Laterality: Right;  . CAPD INSERTION N/A 05/25/2016   Procedure: LAPAROSCOPIC INSERTION CONTINUOUS AMBULATORY PERITONEAL DIALYSIS  (CAPD) CATHETER;  Surgeon: Renford DillsGregory G Schnier, MD;  Location: [facility] ORS;  Service: Vascular;  Laterality: N/A;  . CHOLECYSTECTOMY  1992  . DIALYSIS FISTULA CREATION    . EXCHANGE OF A DIALYSIS CATHETER Left 12/17/2013   Procedure: EXCHANGE OF A DIALYSIS CATHETER;  Surgeon: Chuck Hinthristopher S Dickson, MD;  [facility];  Service: Vascular;  Laterality: Left;  . FISTULOGRAM Right 12/17/2013   Procedure: FISTULOGRAM- RIGHT ARM- POSSIBLE INTERVENTION;  Surgeon: Chuck Hinthristopher S Dickson, MD;  [facility];  Service: Vascular;  Laterality: Right;  . HEMATOMA EVACUATION Right 05/25/2016   Procedure: EVACUATION HEMATOMA ( DRAINAGE );  Surgeon: Renford DillsGregory G Schnier, MD;  Location: [facility] ORS;  Service: Vascular;  Laterality: Right;  . INSERTION OF DIALYSIS  CATHETER Right 06/04/2013   Procedure: INSERTION OF DIALYSIS CATHETER;  Surgeon: Fransisco HertzBrian L Chen, MD;  [facility];  Service: Vascular;  Laterality: Right;  . INSERTION OF DIALYSIS CATHETER Left 11/09/2013   Procedure: INSERTION OF DIALYSIS CATHETER- Left TDC;  Surgeon: Fransisco HertzBrian L Chen, MD;  [facility];  Service: Vascular;  Laterality: Left;  . LIGATION OF ARTERIOVENOUS  FISTULA Left 06/04/2013   Procedure: LIGATION OF ARTERIOVENOUS  FISTULA;  Surgeon: Fransisco HertzBrian L Chen, MD;  [facility];  Service: Vascular;  Laterality: Left;  . LIGATION OF ARTERIOVENOUS  FISTULA Right 01/19/2014   Procedure: LIGATION OF ARTERIOVENOUS  FISTULA- RIGHT BRACHIOCEPHALIC FISTULA;  Surgeon: Fransisco HertzBrian L Chen, MD;  [facility];  Service: Vascular;  Laterality: Right;  . LIGATION OF COMPETING BRANCHES OF ARTERIOVENOUS FISTULA Right 12/17/2013   Procedure: LIGATION OF COMPETING BRANCHES OF ARTERIOVENOUS FISTULA;  Surgeon: Chuck Hinthristopher S Dickson, MD;  [facility];  Service: Vascular;  Laterality: Right;  . PERIPHERAL VASCULAR CATHETERIZATION N/A 06/28/2015   Procedure: Dialysis/Perma Catheter Insertion;  Surgeon: Renford DillsGregory G Schnier, MD;  Location: [facility] INVASIVE CV LAB;  Service: Cardiovascular;  Laterality: N/A;  . PERIPHERAL VASCULAR CATHETERIZATION N/A 09/28/2015   Procedure: Dialysis/Perma Catheter Insertion;  Surgeon: Renford DillsGregory G Schnier, MD;  Location: [facility] INVASIVE CV LAB;  Service: Cardiovascular;  Laterality: N/A;  . PERIPHERAL VASCULAR CATHETERIZATION N/A 01/03/2016   Procedure: Dialysis/Perma Catheter Insertion;  Surgeon: Renford DillsGregory G Schnier, MD;  Location: [facility] INVASIVE CV LAB;  Service: Cardiovascular;  Laterality: N/A;  . PERIPHERAL VASCULAR CATHETERIZATION N/A 01/26/2016   Procedure: Dialysis/Perma Catheter Removal;  Surgeon: Annice NeedyJason S Dew, MD;  Location: [facility] INVASIVE CV LAB;  Service: Cardiovascular;  Laterality: N/A;  . PERIPHERAL VASCULAR CATHETERIZATION Left 08/23/2016   Procedure: Dialysis/Perma Catheter Removal;   Surgeon: Annice NeedyJason S Dew, MD;  Location: [facility] INVASIVE CV LAB;  Service: Cardiovascular;  Laterality: Left;  . REMOVAL OF A DIALYSIS CATHETER Right 11/09/2013   Procedure: REMOVAL OF A DIALYSIS CATHETER- Right TDC ;  Surgeon: Fransisco HertzBrian L Chen, MD;  [facility];  Service: Vascular;  Laterality: Right;  . SHUNTOGRAM Right 09/10/2013   Procedure: Fistulogram;  Surgeon: Fransisco HertzBrian L Chen, MD;  [facility];  Service: Cardiovascular;  Laterality: Right;      Social History:      Social History  Substance Use Topics  . Smoking status: Never Smoker  . Smokeless tobacco: Never Used  . Alcohol use No       Family History :     Family History  Problem Relation Age of Onset  . Family history unknown: Yes      Home Medications:   Prior to Admission medications   Medication Sig Start Date End Date Taking? Authorizing Provider  acetaminophen (TYLENOL) 325 MG tablet Take 325-650 mg by mouth every 6 (six) hours as needed for mild pain.    Yes Historical Provider, MD  ALPRAZolam Prudy Feeler) 0.5 MG tablet Take 0.5 mg by mouth daily as needed for anxiety.  07/03/16  Yes Historical Provider, MD  Amino Acid Infusion (PROSOL) 20 % SOLN Inject 4 L into the vein 7 days. 06/24/15  Yes Historical Provider, MD  calcitRIOL (ROCALTROL) 0.25 MCG capsule Take 0.25 mcg by mouth daily.   Yes Historical Provider, MD  carvedilol (COREG) 3.125 MG tablet Take 3.125 mg by mouth 2 (two) times daily with a meal.  06/22/16  Yes Historical Provider, MD  cinacalcet (SENSIPAR) 30 MG tablet Take 30 mg by mouth daily.   Yes Historical Provider, MD  clopidogrel (PLAVIX) 75 MG tablet Take 75 mg by mouth daily.  01/04/15  Yes Historical Provider, MD  donepezil (ARICEPT) 10 MG tablet Take 10 mg by mouth at bedtime.   Yes Historical Provider, MD  gentamicin cream (GARAMYCIN) 0.1 % Apply 1 application topically daily. 11/23/16  Yes Historical Provider, MD  Memantine HCl ER (NAMENDA XR) 28 MG CP24 Take 28 mg by mouth daily.   Yes  Historical Provider, MD  multivitamin (RENA-VIT) TABS tablet Take 1 tablet by mouth daily.   Yes Historical Provider, MD  potassium chloride SA (K-DUR,KLOR-CON) 20 MEQ tablet Take 20 mEq by mouth daily.   Yes Historical Provider, MD  sevelamer carbonate (RENVELA) 800 MG tablet Take 800-1,600 mg by mouth 3 (three) times daily with meals. 2 with meals and 1 with snacks   Yes Historical Provider, MD  torsemide (DEMADEX) 20 MG tablet Take 20 mg by mouth daily.   Yes Historical Provider, MD     Allergies:     Allergies  Allergen Reactions  . Sulfa Antibiotics Nausea And Vomiting and Rash  . Sulfur Nausea And Vomiting and Rash  . Aspirin Nausea And Vomiting  . Lipitor [Atorvastatin]     Muscle aches , confusion     Physical Exam:   Vitals  Blood pressure 110/56, pulse 96, temperature 99.6 F (37.6 C), temperature source Rectal, resp. rate 20, height 5\' 4"  (1.626 m), weight 52.2 kg (115 lb), SpO2 96 %.  1.  General: Patient currently intubated   2. Neurologic: Not performed as patient is intubated  3. Eyes :  anicteric sclerae, moist conjunctivae with no lid lag.   4. ENMT:  Oropharynx clear with moist mucous membranes and good dentition  5. Neck:  supple, no cervical lymphadenopathy appriciated, No thyromegaly  6. Respiratory : Normal respiratory effort, bilateral rhonchi on auscultation  7. Cardiovascular : RRR, no gallops, rubs or murmurs, no leg edema  8. Gastrointestinal:  Positive bowel sounds, abdomen soft, non-tender to palpation,no hepatosplenomegaly, no rigidity or guarding       9. Skin:  No cyanosis, normal texture and turgor, no rash, lesions or ulcers      Data Review:    CBC  Recent Labs Lab 12/06/2016 2212  WBC 18.4*  HGB 9.1*  HCT 28.6*  PLT PLATELET CLUMPS NOTED ON SMEAR, COUNT APPEARS ADEQUATE  MCV 99.7  MCH 31.7  MCHC 31.8  RDW 16.6*  LYMPHSABS 0.6*  MONOABS 0.7  EOSABS 0.2  BASOSABS 0.0    ------------------------------------------------------------------------------------------------------------------  Chemistries  Recent Labs Lab 02-Jan-2017 2212  NA 137  K 4.9  CL 96*  CO2 24  GLUCOSE 92  BUN 49*  CREATININE 7.06*  CALCIUM 7.7*  AST 19  ALT 14  ALKPHOS 109  BILITOT 0.3   ------------------------------------------------------------------------------------------------------------------  ------------------------------------------------------------------------------------------------------------------ GFR: Estimated Creatinine Clearance: 4.9 mL/min (by C-G formula based on SCr of 7.06 mg/dL (H)). Liver Function Tests:  Recent Labs Lab 2017/01/02 2212  AST 19  ALT 14  ALKPHOS 109  BILITOT 0.3  PROT 5.5*  ALBUMIN 1.8*   Thyroid Function Tests:  Recent Labs  Jan 02, 2017 2212  TSH 3.273   Anemia Panel: No results for input(s): VITAMINB12, FOLATE, FERRITIN, TIBC, IRON, RETICCTPCT in the last 72 hours.  --------------------------------------------------------------------------------------------------------------- Urine analysis:    Component Value Date/Time   COLORURINE Yellow 12/13/2014 1410   COLORURINE YELLOW 08/09/2014 1043   APPEARANCEUR Turbid 12/13/2014 1410   LABSPEC 1.006 12/13/2014 1410   PHURINE 7.0 12/13/2014 1410   PHURINE 7.0 08/09/2014 1043   GLUCOSEU 50 mg/dL 09/15/2535 6440   HGBUR 1+ 12/13/2014 1410   HGBUR LARGE (A) 08/09/2014 1043   BILIRUBINUR Negative 12/13/2014 1410   KETONESUR Negative 12/13/2014 1410   KETONESUR NEGATIVE 08/09/2014 1043   PROTEINUR 100 mg/dL 34/74/2595 6387   PROTEINUR 100 (A) 08/09/2014 1043   UROBILINOGEN 0.2 08/09/2014 1043   NITRITE Negative 12/13/2014 1410   NITRITE NEGATIVE 08/09/2014 1043   LEUKOCYTESUR 3+ 12/13/2014 1410      Imaging Results:    Dg Chest Port 1 View  Result Date: 2017/01/02 CLINICAL DATA:  83 y/o F; code sepsis, hypotension tonight. Several days of productive cough.  EXAM: PORTABLE CHEST 1 VIEW COMPARISON:  07/15/2016 chest radiograph FINDINGS: Stable cardiac silhouette given projection and technique. Aortic atherosclerosis with calcifications. Small left pleural effusion and left basilar consolidation. Coarse interstitial markings likely represents chronic lung disease. Left axillary vascular stent. Right-sided dialysis catheter tip projects over the right atrium. Chronic right-sided rib fractures. IMPRESSION: Small left pleural effusion and left basilar consolidation which may represent pneumonia or atelectasis. Coarse interstitial opacities probably represent chronic edema or interstitial lung disease. Aortic atherosclerosis. Electronically Signed   By: Mitzi Hansen M.D.   On: 02-Jan-2017 22:47    My personal review of EKG: Rhythm NSR   Assessment & Plan:    Active Problems:   CAP (community acquired pneumonia)   ESRD (end stage renal disease) on dialysis (HCC)   Respiratory failure (HCC)   Septic shock (HCC)   1. Septic shock-patient presented with septic shock likely due to underlying infectious process from community acquired pneumonia versus dental abscess. Will continue with vancomycin and Zosyn per pharmacy consultation. Also continue pressor support with vasopressin and norepinephrine. 2. Acute respiratory failure- secondary to comminuted acquired pneumonia, pulmonary edema, status post PE arrest. Continue mechanical ventilation. Patient on vancomycin and Zosyn per pharmacy consultation. Nephrology has been consulted as patient would require hemodialysis in a.m. 3. PEA arrest- patient underwent PEA arrest requiring atropine, cardioversion, amiodarone, CPR for 2 minutes, with a return to spontaneous circulation. She is currently intubated, on mechanical ventilation. 4. End-stage renal disease-patient is on peritoneal dialysis, will consult nephrology in a.m. 5. ? STEMI- EKG showed ST elevation post PEA arrest, code STEMI was initially called  and it was canceled after discussion with patient's son who did not want any escalation of care with no transfer to [facility]. Will cycle troponin every 6 hours 3, continue with Plavix 75 mg daily. Will consult cardiology for further recommendations. 6. Goals of  care- I discussed in detail with patient's son regarding patient's deteriorating medical condition. At this time, son has decided to make her DO NOT RESUSCITATE. With no escalation of care. If patient fails to improve, goal would be comfort care    DVT Prophylaxis-   Heparin  AM Labs Ordered, also please review Full Orders  Family Communication: Admission, patients condition and plan of care including tests being ordered have been discussed with the patient's son at bedside who indicate understanding and agree with the plan and Code Status.  Code Status:  DO NOT RESUSCITATE  Admission status: Inpatient  Time spent in minutes : 60 min   LAMA,GAGAN S M.D on 2017/01/03 at 5:34 AM  Between 7am to 7pm - Pager - [phone]. After 7pm go to [email or web address] - password [facility]  [facility] - Office  [phone]

## 2016-12-20 NOTE — Progress Notes (Signed)
[facility] All IV access was removed. Peritoneal dialysis cath was left in and funeral home stated they will remove tube. Post mortem list was completed and all individuals called.

## 2016-12-20 DEATH — deceased
